# Patient Record
Sex: Female | Born: 1945
Health system: Southern US, Community
[De-identification: ages and names within clinical notes are randomized; demographics above are authoritative.]

## PROBLEM LIST (undated history)

## (undated) DIAGNOSIS — I10 Essential (primary) hypertension: Secondary | ICD-10-CM

## (undated) DIAGNOSIS — E079 Disorder of thyroid, unspecified: Secondary | ICD-10-CM

## (undated) DIAGNOSIS — E119 Type 2 diabetes mellitus without complications: Secondary | ICD-10-CM

## (undated) DIAGNOSIS — H409 Unspecified glaucoma: Secondary | ICD-10-CM

## (undated) DIAGNOSIS — I4891 Unspecified atrial fibrillation: Secondary | ICD-10-CM

## (undated) HISTORY — PX: LASIK: SHX215

## (undated) HISTORY — PX: CHOLECYSTECTOMY: SHX55

## (undated) HISTORY — PX: HERNIA REPAIR: SHX51

## (undated) HISTORY — PX: APPENDECTOMY: SHX54

---

## 2016-09-13 ENCOUNTER — Emergency Department (HOSPITAL_COMMUNITY): Payer: Medicare Other

## 2016-09-13 ENCOUNTER — Encounter (HOSPITAL_COMMUNITY): Payer: Self-pay | Admitting: *Deleted

## 2016-09-13 ENCOUNTER — Inpatient Hospital Stay (HOSPITAL_COMMUNITY): Payer: Medicare Other

## 2016-09-13 ENCOUNTER — Inpatient Hospital Stay (HOSPITAL_COMMUNITY)
Admission: EM | Admit: 2016-09-13 | Discharge: 2016-09-18 | DRG: 291 | Disposition: A | Discharge status: Home Health | Payer: Medicare Other | Attending: Family Medicine | Admitting: Family Medicine

## 2016-09-13 ENCOUNTER — Other Ambulatory Visit: Payer: Self-pay

## 2016-09-13 DIAGNOSIS — Z79899 Other long term (current) drug therapy: Secondary | ICD-10-CM | POA: Diagnosis not present

## 2016-09-13 DIAGNOSIS — L03115 Cellulitis of right lower limb: Secondary | ICD-10-CM | POA: Diagnosis present

## 2016-09-13 DIAGNOSIS — I5031 Acute diastolic (congestive) heart failure: Secondary | ICD-10-CM | POA: Diagnosis present

## 2016-09-13 DIAGNOSIS — I872 Venous insufficiency (chronic) (peripheral): Secondary | ICD-10-CM | POA: Diagnosis not present

## 2016-09-13 DIAGNOSIS — I340 Nonrheumatic mitral (valve) insufficiency: Secondary | ICD-10-CM | POA: Diagnosis present

## 2016-09-13 DIAGNOSIS — L0390 Cellulitis, unspecified: Secondary | ICD-10-CM | POA: Diagnosis not present

## 2016-09-13 DIAGNOSIS — J181 Lobar pneumonia, unspecified organism: Secondary | ICD-10-CM | POA: Diagnosis not present

## 2016-09-13 DIAGNOSIS — R0602 Shortness of breath: Secondary | ICD-10-CM

## 2016-09-13 DIAGNOSIS — I5033 Acute on chronic diastolic (congestive) heart failure: Secondary | ICD-10-CM | POA: Diagnosis not present

## 2016-09-13 DIAGNOSIS — I482 Chronic atrial fibrillation: Secondary | ICD-10-CM | POA: Diagnosis present

## 2016-09-13 DIAGNOSIS — R05 Cough: Secondary | ICD-10-CM | POA: Diagnosis not present

## 2016-09-13 DIAGNOSIS — J9600 Acute respiratory failure, unspecified whether with hypoxia or hypercapnia: Secondary | ICD-10-CM | POA: Diagnosis present

## 2016-09-13 DIAGNOSIS — Z9119 Patient's noncompliance with other medical treatment and regimen: Secondary | ICD-10-CM

## 2016-09-13 DIAGNOSIS — R402252 Coma scale, best verbal response, oriented, at arrival to emergency department: Secondary | ICD-10-CM | POA: Diagnosis present

## 2016-09-13 DIAGNOSIS — J189 Pneumonia, unspecified organism: Secondary | ICD-10-CM

## 2016-09-13 DIAGNOSIS — I110 Hypertensive heart disease with heart failure: Principal | ICD-10-CM | POA: Diagnosis present

## 2016-09-13 DIAGNOSIS — Z6841 Body Mass Index (BMI) 40.0 and over, adult: Secondary | ICD-10-CM

## 2016-09-13 DIAGNOSIS — Z880 Allergy status to penicillin: Secondary | ICD-10-CM

## 2016-09-13 DIAGNOSIS — E032 Hypothyroidism due to medicaments and other exogenous substances: Secondary | ICD-10-CM | POA: Diagnosis present

## 2016-09-13 DIAGNOSIS — N179 Acute kidney failure, unspecified: Secondary | ICD-10-CM | POA: Diagnosis present

## 2016-09-13 DIAGNOSIS — J440 Chronic obstructive pulmonary disease with acute lower respiratory infection: Secondary | ICD-10-CM | POA: Diagnosis present

## 2016-09-13 DIAGNOSIS — R0600 Dyspnea, unspecified: Secondary | ICD-10-CM

## 2016-09-13 DIAGNOSIS — F419 Anxiety disorder, unspecified: Secondary | ICD-10-CM | POA: Diagnosis present

## 2016-09-13 DIAGNOSIS — R059 Cough, unspecified: Secondary | ICD-10-CM

## 2016-09-13 DIAGNOSIS — R402142 Coma scale, eyes open, spontaneous, at arrival to emergency department: Secondary | ICD-10-CM | POA: Diagnosis present

## 2016-09-13 DIAGNOSIS — F329 Major depressive disorder, single episode, unspecified: Secondary | ICD-10-CM | POA: Diagnosis present

## 2016-09-13 DIAGNOSIS — R402362 Coma scale, best motor response, obeys commands, at arrival to emergency department: Secondary | ICD-10-CM | POA: Diagnosis present

## 2016-09-13 DIAGNOSIS — I509 Heart failure, unspecified: Secondary | ICD-10-CM

## 2016-09-13 DIAGNOSIS — E119 Type 2 diabetes mellitus without complications: Secondary | ICD-10-CM | POA: Diagnosis present

## 2016-09-13 HISTORY — DX: Type 2 diabetes mellitus without complications: E11.9

## 2016-09-13 HISTORY — DX: Disorder of thyroid, unspecified: E07.9

## 2016-09-13 HISTORY — DX: Essential (primary) hypertension: I10

## 2016-09-13 HISTORY — DX: Unspecified atrial fibrillation: I48.91

## 2016-09-13 HISTORY — DX: Unspecified glaucoma: H40.9

## 2016-09-13 LAB — COMPREHENSIVE METABOLIC PANEL
ALBUMIN: 3.4 g/dL — AB (ref 3.5–5.0)
ALK PHOS: 62 U/L (ref 38–126)
ALT: 29 U/L (ref 14–54)
AST: 23 U/L (ref 15–41)
Anion gap: 8 (ref 5–15)
BILIRUBIN TOTAL: 2.4 mg/dL — AB (ref 0.3–1.2)
BUN: 30 mg/dL — AB (ref 6–20)
CO2: 26 mmol/L (ref 22–32)
Calcium: 8.8 mg/dL — ABNORMAL LOW (ref 8.9–10.3)
Chloride: 108 mmol/L (ref 101–111)
Creatinine, Ser: 0.94 mg/dL (ref 0.44–1.00)
GFR calc Af Amer: >60 mL/min (ref >60)
GFR calc non Af Amer: >60 mL/min (ref >60)
GLUCOSE: 117 mg/dL — AB (ref 65–99)
POTASSIUM: 3.3 mmol/L — AB (ref 3.5–5.1)
Sodium: 142 mmol/L (ref 135–145)
TOTAL PROTEIN: 7.9 g/dL (ref 6.5–8.1)

## 2016-09-13 LAB — CBC WITH DIFFERENTIAL/PLATELET
BASOS PCT: 0 %
Basophils Absolute: 0 10*3/uL (ref 0.0–0.1)
Eosinophils Absolute: 0 10*3/uL (ref 0.0–0.7)
Eosinophils Relative: 0 %
HEMATOCRIT: 31.7 % — AB (ref 36.0–46.0)
HEMOGLOBIN: 9.6 g/dL — AB (ref 12.0–15.0)
Lymphocytes Relative: 5 %
Lymphs Abs: 0.4 10*3/uL — ABNORMAL LOW (ref 0.7–4.0)
MCH: 26.5 pg (ref 26.0–34.0)
MCHC: 30.3 g/dL (ref 30.0–36.0)
MCV: 87.6 fL (ref 78.0–100.0)
Monocytes Absolute: 0.5 10*3/uL (ref 0.1–1.0)
Monocytes Relative: 6 %
NEUTROS ABS: 8.4 10*3/uL — AB (ref 1.7–7.7)
NEUTROS PCT: 89 %
Platelets: 231 10*3/uL (ref 150–400)
RBC: 3.62 MIL/uL — ABNORMAL LOW (ref 3.87–5.11)
RDW: 16.9 % — AB (ref 11.5–15.5)
WBC: 9.4 10*3/uL (ref 4.0–10.5)

## 2016-09-13 LAB — APTT: aPTT: 32 seconds (ref 24–36)

## 2016-09-13 LAB — PROTIME-INR
INR: 2.11
Prothrombin Time: 24 seconds — ABNORMAL HIGH (ref 11.4–15.2)

## 2016-09-13 LAB — GLUCOSE, CAPILLARY: GLUCOSE-CAPILLARY: 135 mg/dL — AB (ref 65–99)

## 2016-09-13 LAB — TROPONIN I
Troponin I: 0.03 ng/mL (ref <0.03)
Troponin I: <0.03 ng/mL (ref <0.03)

## 2016-09-13 LAB — MAGNESIUM: MAGNESIUM: 1.7 mg/dL (ref 1.7–2.4)

## 2016-09-13 LAB — BRAIN NATRIURETIC PEPTIDE: B NATRIURETIC PEPTIDE 5: 360.4 pg/mL — AB (ref 0.0–100.0)

## 2016-09-13 LAB — TSH: TSH: 3.953 u[IU]/mL (ref 0.350–4.500)

## 2016-09-13 LAB — SEDIMENTATION RATE: SED RATE: 75 mm/h — AB (ref 0–22)

## 2016-09-13 MED ORDER — LEVOFLOXACIN IN D5W 750 MG/150ML IV SOLN
750.0000 mg | INTRAVENOUS | Status: DC
  Administered 2016-09-13 – 2016-09-15 (×3): 750 mg via INTRAVENOUS
  Filled 2016-09-13 (×3): qty 150

## 2016-09-13 MED ORDER — TRAMADOL HCL 50 MG PO TABS
50.0000 mg | ORAL_TABLET | Freq: Four times a day (QID) | ORAL | Status: DC | PRN
  Administered 2016-09-14 – 2016-09-17 (×7): 50 mg via ORAL
  Filled 2016-09-13 (×8): qty 1

## 2016-09-13 MED ORDER — IOPAMIDOL (ISOVUE-370) INJECTION 76%
INTRAVENOUS | Status: AC
  Administered 2016-09-13: 100 mL
  Filled 2016-09-13: qty 100

## 2016-09-13 MED ORDER — MORPHINE SULFATE (PF) 4 MG/ML IV SOLN
2.0000 mg | Freq: Once | INTRAVENOUS | Status: AC
  Administered 2016-09-13: 2 mg via INTRAVENOUS
  Filled 2016-09-13: qty 1

## 2016-09-13 MED ORDER — TRAZODONE HCL 50 MG PO TABS
50.0000 mg | ORAL_TABLET | Freq: Every day | ORAL | Status: DC
  Administered 2016-09-13 – 2016-09-17 (×5): 100 mg via ORAL
  Filled 2016-09-13 (×5): qty 2

## 2016-09-13 MED ORDER — MORPHINE SULFATE (PF) 4 MG/ML IV SOLN
2.0000 mg | INTRAVENOUS | Status: DC | PRN
  Administered 2016-09-13 – 2016-09-17 (×8): 2 mg via INTRAVENOUS
  Filled 2016-09-13 (×8): qty 1

## 2016-09-13 MED ORDER — IPRATROPIUM BROMIDE 0.02 % IN SOLN
0.5000 mg | Freq: Once | RESPIRATORY_TRACT | Status: AC
  Administered 2016-09-13: 0.5 mg via RESPIRATORY_TRACT
  Filled 2016-09-13: qty 2.5

## 2016-09-13 MED ORDER — INSULIN ASPART 100 UNIT/ML ~~LOC~~ SOLN: 0.0000 [IU] | Freq: Every day | SUBCUTANEOUS | Status: DC

## 2016-09-13 MED ORDER — METOPROLOL TARTRATE 50 MG PO TABS
50.0000 mg | ORAL_TABLET | Freq: Two times a day (BID) | ORAL | Status: DC
  Administered 2016-09-13 – 2016-09-18 (×10): 50 mg via ORAL
  Filled 2016-09-13 (×10): qty 1

## 2016-09-13 MED ORDER — ALPRAZOLAM 0.5 MG PO TABS
0.5000 mg | ORAL_TABLET | Freq: Two times a day (BID) | ORAL | Status: DC | PRN
  Administered 2016-09-14 – 2016-09-17 (×6): 0.5 mg via ORAL
  Filled 2016-09-13 (×6): qty 1

## 2016-09-13 MED ORDER — SODIUM CHLORIDE 0.9% FLUSH: 3.0000 mL | INTRAVENOUS | Status: DC | PRN

## 2016-09-13 MED ORDER — POTASSIUM CHLORIDE CRYS ER 20 MEQ PO TBCR
40.0000 meq | EXTENDED_RELEASE_TABLET | Freq: Three times a day (TID) | ORAL | Status: AC
  Administered 2016-09-13 – 2016-09-14 (×3): 40 meq via ORAL
  Filled 2016-09-13 (×3): qty 2

## 2016-09-13 MED ORDER — ACETAMINOPHEN 325 MG PO TABS
650.0000 mg | ORAL_TABLET | Freq: Four times a day (QID) | ORAL | Status: DC | PRN
  Administered 2016-09-16 (×2): 650 mg via ORAL
  Filled 2016-09-13 (×2): qty 2

## 2016-09-13 MED ORDER — SODIUM CHLORIDE 0.9 % IV SOLN: 250.0000 mL | INTRAVENOUS | Status: DC | PRN

## 2016-09-13 MED ORDER — LORATADINE 10 MG PO TABS
10.0000 mg | ORAL_TABLET | Freq: Every evening | ORAL | Status: DC
  Administered 2016-09-14 – 2016-09-18 (×5): 10 mg via ORAL
  Filled 2016-09-13 (×5): qty 1

## 2016-09-13 MED ORDER — ENOXAPARIN SODIUM 80 MG/0.8ML ~~LOC~~ SOLN: 75.0000 mg | SUBCUTANEOUS | Status: DC

## 2016-09-13 MED ORDER — MONTELUKAST SODIUM 10 MG PO TABS
10.0000 mg | ORAL_TABLET | Freq: Every day | ORAL | Status: DC
  Administered 2016-09-13 – 2016-09-17 (×5): 10 mg via ORAL
  Filled 2016-09-13 (×5): qty 1

## 2016-09-13 MED ORDER — METFORMIN HCL 500 MG PO TABS
500.0000 mg | ORAL_TABLET | Freq: Every day | ORAL | Status: DC
  Administered 2016-09-14 – 2016-09-18 (×5): 500 mg via ORAL
  Filled 2016-09-13 (×5): qty 1

## 2016-09-13 MED ORDER — SODIUM CHLORIDE 0.45 % IV SOLN
INTRAVENOUS | Status: DC
  Administered 2016-09-13: 21:00:00 via INTRAVENOUS

## 2016-09-13 MED ORDER — DULOXETINE HCL 30 MG PO CPEP
90.0000 mg | ORAL_CAPSULE | Freq: Every day | ORAL | Status: DC
  Administered 2016-09-14 – 2016-09-18 (×5): 90 mg via ORAL
  Filled 2016-09-13 (×6): qty 1

## 2016-09-13 MED ORDER — ENOXAPARIN SODIUM 40 MG/0.4ML ~~LOC~~ SOLN: 40.0000 mg | SUBCUTANEOUS | Status: DC

## 2016-09-13 MED ORDER — BISACODYL 10 MG RE SUPP: 10.0000 mg | Freq: Every day | RECTAL | Status: DC | PRN

## 2016-09-13 MED ORDER — FUROSEMIDE 10 MG/ML IJ SOLN
40.0000 mg | Freq: Two times a day (BID) | INTRAMUSCULAR | Status: DC
  Administered 2016-09-13 – 2016-09-14 (×3): 40 mg via INTRAVENOUS
  Filled 2016-09-13 (×3): qty 4

## 2016-09-13 MED ORDER — AMIODARONE HCL 200 MG PO TABS
200.0000 mg | ORAL_TABLET | Freq: Every day | ORAL | Status: DC
  Administered 2016-09-14 – 2016-09-18 (×5): 200 mg via ORAL
  Filled 2016-09-13 (×5): qty 1

## 2016-09-13 MED ORDER — PANTOPRAZOLE SODIUM 40 MG PO TBEC
40.0000 mg | DELAYED_RELEASE_TABLET | Freq: Every day | ORAL | Status: DC
  Administered 2016-09-13 – 2016-09-18 (×6): 40 mg via ORAL
  Filled 2016-09-13 (×6): qty 1

## 2016-09-13 MED ORDER — ONDANSETRON HCL 4 MG PO TABS: 4.0000 mg | ORAL_TABLET | Freq: Four times a day (QID) | ORAL | Status: DC | PRN

## 2016-09-13 MED ORDER — POTASSIUM CHLORIDE CRYS ER 20 MEQ PO TBCR: 20.0000 meq | EXTENDED_RELEASE_TABLET | Freq: Every day | ORAL | Status: DC

## 2016-09-13 MED ORDER — SODIUM CHLORIDE 0.9 % IV SOLN
INTRAVENOUS | Status: DC
  Administered 2016-09-13: 15:00:00 via INTRAVENOUS

## 2016-09-13 MED ORDER — ATOMOXETINE HCL 40 MG PO CAPS
80.0000 mg | ORAL_CAPSULE | Freq: Every day | ORAL | Status: DC
  Administered 2016-09-14 – 2016-09-18 (×5): 80 mg via ORAL
  Filled 2016-09-13 (×5): qty 2

## 2016-09-13 MED ORDER — LEVOTHYROXINE SODIUM 25 MCG PO TABS
25.0000 ug | ORAL_TABLET | Freq: Every day | ORAL | Status: DC
  Administered 2016-09-14 – 2016-09-18 (×5): 25 ug via ORAL
  Filled 2016-09-13 (×5): qty 1

## 2016-09-13 MED ORDER — ALBUTEROL SULFATE (2.5 MG/3ML) 0.083% IN NEBU
2.5000 mg | INHALATION_SOLUTION | Freq: Four times a day (QID) | RESPIRATORY_TRACT | Status: DC | PRN
  Administered 2016-09-14 – 2016-09-15 (×3): 2.5 mg via RESPIRATORY_TRACT
  Filled 2016-09-13 (×3): qty 3

## 2016-09-13 MED ORDER — ALBUTEROL SULFATE (2.5 MG/3ML) 0.083% IN NEBU
5.0000 mg | INHALATION_SOLUTION | Freq: Once | RESPIRATORY_TRACT | Status: AC
  Administered 2016-09-13: 5 mg via RESPIRATORY_TRACT
  Filled 2016-09-13: qty 6

## 2016-09-13 MED ORDER — SODIUM CHLORIDE 0.9% FLUSH
3.0000 mL | Freq: Two times a day (BID) | INTRAVENOUS | Status: DC
  Administered 2016-09-13 – 2016-09-18 (×9): 3 mL via INTRAVENOUS

## 2016-09-13 MED ORDER — ONDANSETRON HCL 4 MG/2ML IJ SOLN: 4.0000 mg | Freq: Four times a day (QID) | INTRAMUSCULAR | Status: DC | PRN

## 2016-09-13 MED ORDER — METHYLPREDNISOLONE SODIUM SUCC 125 MG IJ SOLR
125.0000 mg | Freq: Once | INTRAMUSCULAR | Status: AC
  Administered 2016-09-13: 125 mg via INTRAVENOUS
  Filled 2016-09-13: qty 2

## 2016-09-13 MED ORDER — ACETAMINOPHEN 650 MG RE SUPP: 650.0000 mg | Freq: Four times a day (QID) | RECTAL | Status: DC | PRN

## 2016-09-13 MED ORDER — INSULIN ASPART 100 UNIT/ML ~~LOC~~ SOLN
0.0000 [IU] | Freq: Three times a day (TID) | SUBCUTANEOUS | Status: DC
  Administered 2016-09-14: 3 [IU] via SUBCUTANEOUS
  Administered 2016-09-14 – 2016-09-18 (×3): 2 [IU] via SUBCUTANEOUS

## 2016-09-13 NOTE — ED Notes (Signed)
Bed: WA06 Expected date:  Expected time:  Means of arrival:  Comments: 71 yo f edema lower leg, cough

## 2016-09-13 NOTE — H&P (Signed)
History and Physical    Hannah [name] JYN:[id number] DOB: [birth year] DOA: 09/13/2016  Referring MD/NP/PA: EDP PCP: No PCP Per Patient  Outpatient Specialists:  Patient coming from: home  Chief Complaint: cough and shortness of breath  HPI: Hannah [name] is a 71 y.o. female with medical history significant for and not limited to atrial fibrillation, diabetes mellitus and CHF who presents to the ED with 2 day history of cough productive of thick yellowish sputum and shortness of breath without chest pain, palpitation, dizziness, fever or chills.  She recently moved to [place] from [place] and had driven from [place] over last few days. She had also noted increasing swelling of her lower extremities from her baseline. She has some redness of both extremities but that of the right lower extremity has been worsening for a few weeks now.  Lastly she suffered accidental fall whilst moving furniture at home in [place] recently.   ED Course: at the ED patient's x-ray of the chest was negative for any airspace disease. Check follow-up CT angiogram which was negative for PE but showed bilateral patchy airspace disease c/w with possible pneumonia. IV normal saline initiated and hospitalist consulted for admission  Review of Systems: As per HPI otherwise 10 point review of systems negative.    Past Medical History:  Diagnosis Date  . Atrial fibrillation ([place])   . Diabetes mellitus without complication ([place])   . Glaucoma    BILATERAL  . Hypertension   . Thyroid disease     Past Surgical History:  Procedure Laterality Date  . HERNIA REPAIR     UMBILICAL  . LASIK     BILATERAL     reports that she has never smoked. She has never used smokeless tobacco. She reports that she does not drink alcohol. Her drug history is not on file.  Allergies  Allergen Reactions  . Penicillins     Possibility; father and brother allergic     History reviewed. No pertinent  family history.   Prior to Admission medications   Medication Sig Start Date End Date Taking? Authorizing Provider  ALPRAZolam Duanne Moron) 0.5 MG tablet Take 0.5 mg by mouth 2 (two) times daily as needed for anxiety.   Yes Historical Provider, MD  amiodarone (PACERONE) 200 MG tablet Take 200 mg by mouth daily.   Yes Historical Provider, MD  atomoxetine (STRATTERA) 80 MG capsule Take 80 mg by mouth daily.   Yes Historical Provider, MD  busPIRone (BUSPAR) 15 MG tablet Take 7.5 mg by mouth 2 (two) times daily.   Yes Historical Provider, MD  DULoxetine (CYMBALTA) 30 MG capsule Take 90 mg by mouth daily.   Yes Historical Provider, MD  esomeprazole (NEXIUM) 40 MG capsule Take 40 mg by mouth daily.   Yes Historical Provider, MD  furosemide (LASIX) 80 MG tablet Take 80 mg by mouth daily.   Yes Historical Provider, MD  levocetirizine (XYZAL) 5 MG tablet Take 5 mg by mouth every evening.   Yes Historical Provider, MD  levothyroxine (SYNTHROID, LEVOTHROID) 25 MCG tablet Take 25 mcg by mouth daily before breakfast.   Yes Historical Provider, MD  metFORMIN (GLUCOPHAGE) 500 MG tablet Take 500 mg by mouth daily with breakfast.   Yes Historical Provider, MD  metoprolol (LOPRESSOR) 50 MG tablet Take 50 mg by mouth 2 (two) times daily.   Yes Historical Provider, MD  montelukast (SINGULAIR) 10 MG tablet Take 10 mg by mouth every evening.   Yes Historical Provider, MD  Potassium  Chloride ER 20 MEQ TBCR Take 20 mEq by mouth daily.   Yes Historical Provider, MD  traZODone (DESYREL) 50 MG tablet Take 50-100 mg by mouth at bedtime.   Yes Historical Provider, MD  warfarin (COUMADIN) 5 MG tablet Take 5 mg by mouth daily.   Yes Historical Provider, MD  zolpidem (AMBIEN) 10 MG tablet Take 10 mg by mouth at bedtime.   Yes Historical Provider, MD    Physical Exam: Vitals:   09/13/16 1313 09/13/16 1627 09/13/16 1645 09/13/16 1744  BP:  164/97 179/66 142/85  Pulse:  97  99  Resp:  22  22  Temp:      TempSrc:      SpO2:   100%  100%  Weight: (!) 147.4 kg (325 lb)     Height: 5\' 6"  (1.676 m)         Constitutional: NAD, calm, comfortable Vitals:   09/13/16 1313 09/13/16 1627 09/13/16 1645 09/13/16 1744  BP:  164/97 179/66 142/85  Pulse:  97  99  Resp:  22  22  Temp:      TempSrc:      SpO2:  100%  100%  Weight: (!) 147.4 kg (325 lb)     Height: 5\' 6"  (1.676 m)      Eyes: PERRL, lids and conjunctivae normal ENMT: Mucous membranes are moist. Posterior pharynx clear of any exudate or lesions.Normal dentition.  Neck: normal, supple, no masses, no thyromegaly Respiratory: diminished breath sounds with bilateral rhonchorous breath sounds.  Normal respiratory effort. No accessory muscle use.  Cardiovascular: irregularly iregula rhythm, no murmurs / rubs / gallops. No extremity edema. 2+ pedal pulses. No carotid bruits.  Abdomen: no tenderness, no masses palpated. No hepatosplenomegaly. Bowel sounds positive.  Musculoskeletal: no clubbing / cyanosis. No joint deformity upper and lower extremities. Good ROM, no contractures. Normal muscle tone. Bilateral pitting edema (2+), superimposed redness of both lower legs, right >> left; right swelling and redness warm to touch, slightly tender to palpation without any fluctuancy Skin: no rashes,ulcers.No induration. Right upper arm bruise. Neurologic: CN 2-12 grossly intact. Sensation intact, DTR normal. Strength 5/5 in all 4.  Psychiatric: Normal judgment and insight. Alert and oriented x 3. Normal mood.   Labs on Admission: I have personally reviewed following labs and imaging studies  CBC:  Recent Labs Lab 09/13/16 1337  WBC 9.4  NEUTROABS 8.4*  HGB 9.6*  HCT 31.7*  MCV 87.6  PLT 016   Basic Metabolic Panel:  Recent Labs Lab 09/13/16 1337  NA 142  K 3.3*  CL 108  CO2 26  GLUCOSE 117*  BUN 30*  CREATININE 0.94  CALCIUM 8.8*   GFR: Estimated Creatinine Clearance: 84.3 mL/min (by C-G formula based on SCr of 0.94 mg/dL). Liver Function  Tests:  Recent Labs Lab 09/13/16 1337  AST 23  ALT 29  ALKPHOS 62  BILITOT 2.4*  PROT 7.9  ALBUMIN 3.4*   No results for input(s): LIPASE, AMYLASE in the last 168 hours. No results for input(s): AMMONIA in the last 168 hours. Coagulation Profile: No results for input(s): INR, PROTIME in the last 168 hours. Cardiac Enzymes:  Recent Labs Lab 09/13/16 1337  TROPONINI 0.03*   BNP (last 3 results) No results for input(s): PROBNP in the last 8760 hours. HbA1C: No results for input(s): HGBA1C in the last 72 hours. CBG: No results for input(s): GLUCAP in the last 168 hours. Lipid Profile: No results for input(s): CHOL, HDL, LDLCALC, TRIG, CHOLHDL, LDLDIRECT in the  last 72 hours. Thyroid Function Tests: No results for input(s): TSH, T4TOTAL, FREET4, T3FREE, THYROIDAB in the last 72 hours. Anemia Panel: No results for input(s): VITAMINB12, FOLATE, FERRITIN, TIBC, IRON, RETICCTPCT in the last 72 hours. Urine analysis: No results found for: COLORURINE, APPEARANCEUR, LABSPEC, PHURINE, GLUCOSEU, HGBUR, BILIRUBINUR, KETONESUR, PROTEINUR, UROBILINOGEN, NITRITE, LEUKOCYTESUR Sepsis Labs: @LABRCNTIP (procalcitonin:4,lacticidven:4) )No results found for this or any previous visit (from the past 240 hour(s)).   Radiological Exams on Admission: Ct Angio Chest Pe W Or Wo Contrast  Result Date: 09/13/2016 CLINICAL DATA:  Back pain since falling yesterday. Edema in lower legs. Shortness of breath. EXAM: CT ANGIOGRAPHY CHEST WITH CONTRAST TECHNIQUE: Multidetector CT imaging of the chest was performed using the standard protocol during bolus administration of intravenous contrast. Multiplanar CT image reconstructions and MIPs were obtained to evaluate the vascular anatomy. CONTRAST:  100 mL of Isovue 370 COMPARISON:  Chest x-ray from earlier today FINDINGS: Cardiovascular: Cardiomegaly is identified. There are coronary artery calcifications, particularly in the left coronary arteries. The thoracic  aorta is normal in caliber with no dissection. No pulmonary emboli identified. Mediastinum/Nodes: Shotty nodes are seen in the mediastinum. A representative right paratracheal node on axial image 21 measures 13 x 12 mm in short axis. These nodes may simply be reactive given the findings in the lungs. Mildly enlarged node in the left hilum. No effusions. The esophagus and limited views of the thyroid are normal. Lungs/Pleura: Central airways are normal. No pneumothorax. Mild air trapping in the lungs. Minimal interlobular septal thickening in the apices may represent minimal edema. There is a small dense nodule in the late apex measuring 5.1 mm on coronal image 71. Scattered pulmonary opacities in the lung bases, left greater than right. While there is a component of dependent atelectasis, the findings are consistent with bibasilar infiltrates. There are nodular components which are ground-glass in attenuation. Two such nodules are identified on axial image 36. The larger in the right upper lobe measures 14 mm well the smaller in the superior segment of the right lower lobe measures 8 mm. A somewhat nodular region of opacity is seen posteriorly in the superior segment of the left lower lobe on axial image 51. No masses. Upper Abdomen: No acute abnormality. Musculoskeletal: No chest wall abnormality. No acute or significant osseous findings. Review of the MIP images confirms the above findings. IMPRESSION: 1. No pulmonary emboli. 2. Patchy infiltrates are seen in the lungs bilaterally, most prominent in the left lung base. A few ground-glass nodules, as described above, are probably part of the infiltrative process. I suspect an infectious or inflammatory process. Recommend short-term follow-up CT scan after treatment to ensure resolution. 3. Mild edema.  Mild air trapping. 4. Mild mediastinal and left hilar adenopathy, probably reactive. Recommend attention on follow-up. Electronically Signed   By: Dorise Bullion  III M.D   On: 09/13/2016 18:38   Dg Chest Port 1 View  Result Date: 09/13/2016 CLINICAL DATA:  Shortness of breath, cough. EXAM: PORTABLE CHEST 1 VIEW COMPARISON:  None. FINDINGS: Mild cardiomegaly is noted. No pneumothorax or pleural effusion is noted. Both lungs are clear. The visualized skeletal structures are unremarkable. IMPRESSION: No acute cardiopulmonary abnormality seen. Electronically Signed   By: Marijo Conception, M.D.   On: 09/13/2016 13:47    EKG: -ordered  Assessment/Plan Principal Problem:   Community acquired pneumonia Active Problems:   CHF (congestive heart failure) (HCC)   Cellulitis of right lower extremity  #1: Community-acquired pneumonia: IV antibiotic Nebulized bronchodilators when necessary  Blood culture Oxygen supplementation Supportive care  #2 CHF exacerbation; Mild Gentle diuresis -bun mildly elevated ready Cardiac enzymes ordered  #3 right lower extremity cellulitis: IV antibiotic- Levaquin to cover both #1 and 3 Ultrasound of the extremity to rule out DVT due to asymmetry of both extremities X-ray ordered to rule out DVT due to chronicity by history Sedimentation rate ordered  #4 history of A. Fib: Continue amiodarone and anticoagulation  #5 diabetes mellitus: Continue metformin- hold if creatinine less than or equal to 1.4 Sliding-scale insulin with monitoring of CBGs  #6 hypokalemia: Potassium repletion Monitor with diuresis May have to increase baseline potassium placement   DVT prophylaxis: (Lovenox) Code Status:  (Full) Family Communication:  Disposition Plan:  (Home) Consults called:  Admission status: (inpatient Margarita Sermons MD [facility] Pager [phone]  If 7PM-7AM, please contact night-coverage [email or web address] Password [email or web address]  09/13/2016, 7:17 PM

## 2016-09-13 NOTE — ED Notes (Addendum)
Pt had IV removed in CT scan. She is in need of an additional IV for CT angiogram. Maylon Cos, RN attempting u/s IV placement.

## 2016-09-13 NOTE — ED Notes (Addendum)
Hospitalist, Dr. Orma Render at bedside.

## 2016-09-13 NOTE — ED Notes (Signed)
Patient transported to CT 

## 2016-09-13 NOTE — ED Triage Notes (Signed)
Per EMS, pt complains of back pain since falling yesterday. Pt slipped pushing her chair/walker. Pt has edema to lower legs, which she states has been present for 2 years. Pt also complains of cough for the past week.

## 2016-09-13 NOTE — ED Notes (Signed)
Stuck pt for labs was unsuccessful

## 2016-09-13 NOTE — ED Provider Notes (Signed)
[facility] DEPT Provider Note   CSN: [id number] Arrival date & time: 09/13/16  1241     History   Chief Complaint Chief Complaint  Patient presents with  . Back Pain  . Cough    HPI Hannah [name] is a 71 y.o. female.  71 year old female presents with several days of lower extremity edema, dyspnea, nonproductive cough. History of CHF as well as atrial fibrillation. Denies any fever or chills. No syncope or presyncope. No vomiting. No medications taken for this. Denies any anginal chest pain. No diaphoresis. She is also had orthopnea. Symptoms have been progressively worse      Past Medical History:  Diagnosis Date  . Atrial fibrillation ([place])   . Diabetes mellitus without complication ([place])   . Hypertension   . Thyroid disease     There are no active problems to display for this patient.   No past surgical history on file.  OB History    No data available       Home Medications    Prior to Admission medications   Not on File    Family History No family history on file.  Social History Social History  Substance Use Topics  . Smoking status: Never Smoker  . Smokeless tobacco: Never Used  . Alcohol use No     Allergies   Penicillins   Review of Systems Review of Systems  All other systems reviewed and are negative.    Physical Exam Updated Vital Signs BP 167/84   Pulse 92   Temp 97 F (36.1 C) (Oral)   Resp 22   SpO2 100%   Physical Exam  Constitutional: She is oriented to person, place, and time. She appears well-developed and well-nourished.  Non-toxic appearance. No distress.  HENT:  Head: Normocephalic and atraumatic.  Eyes: Conjunctivae, EOM and lids are normal. Pupils are equal, round, and reactive to light.  Neck: Normal range of motion. Neck supple. No tracheal deviation present. No thyroid mass present.  Cardiovascular: Normal heart sounds.  An irregularly irregular rhythm present. Tachycardia present.  Exam reveals  no gallop.   No murmur heard. Pulmonary/Chest: Effort normal. No stridor. No respiratory distress. She has decreased breath sounds in the right lower field and the left lower field. She has no wheezes. She has rhonchi in the right lower field and the left lower field. She has no rales.  Abdominal: Soft. Normal appearance and bowel sounds are normal. She exhibits no distension. There is no tenderness. There is no rebound and no CVA tenderness.  Musculoskeletal: Normal range of motion. She exhibits no edema or tenderness.  3+ bilateral lower extremity pitting edema with erythema but without frank cellulitic features.  Neurological: She is alert and oriented to person, place, and time. She has normal strength. No cranial nerve deficit or sensory deficit. GCS eye subscore is 4. GCS verbal subscore is 5. GCS motor subscore is 6.  Skin: Skin is warm and dry. No abrasion and no rash noted.  Psychiatric: She has a normal mood and affect. Her speech is normal and behavior is normal.  Nursing note and vitals reviewed.    ED Treatments / Results  Labs (all labs ordered are listed, but only abnormal results are displayed) Labs Reviewed  BRAIN NATRIURETIC PEPTIDE  CBC WITH DIFFERENTIAL/PLATELET  COMPREHENSIVE METABOLIC PANEL  TROPONIN I    EKG  EKG Interpretation None       Radiology No results found.  Procedures Procedures (including critical care time)  Medications Ordered in  ED Medications  0.9 %  sodium chloride infusion (not administered)     Initial Impression / Assessment and Plan / ED Course  I have reviewed the triage vital signs and the nursing notes.  Pertinent labs & imaging results that were available during my care of the patient were reviewed by me and considered in my medical decision making (see chart for details).     Patient treated with albuterol and salmeterol for her wheezing. Does have elevated troponin. Chest CT pending. We'll consult hospitalist for  admission  Final Clinical Impressions(s) / ED Diagnoses   Final diagnoses:  SOB (shortness of breath)    New Prescriptions New Prescriptions   No medications on file     Lacretia Leigh, MD 09/13/16 1601

## 2016-09-13 NOTE — ED Notes (Signed)
Xray at the bedside.

## 2016-09-14 ENCOUNTER — Inpatient Hospital Stay (HOSPITAL_COMMUNITY): Payer: Medicare Other

## 2016-09-14 DIAGNOSIS — L0390 Cellulitis, unspecified: Secondary | ICD-10-CM

## 2016-09-14 LAB — COMPREHENSIVE METABOLIC PANEL
ALBUMIN: 3.1 g/dL — AB (ref 3.5–5.0)
ALT: 27 U/L (ref 14–54)
ANION GAP: 7 (ref 5–15)
AST: 24 U/L (ref 15–41)
Alkaline Phosphatase: 61 U/L (ref 38–126)
BUN: 25 mg/dL — AB (ref 6–20)
CHLORIDE: 107 mmol/L (ref 101–111)
CO2: 27 mmol/L (ref 22–32)
Calcium: 8.7 mg/dL — ABNORMAL LOW (ref 8.9–10.3)
Creatinine, Ser: 0.91 mg/dL (ref 0.44–1.00)
GFR calc Af Amer: >60 mL/min (ref >60)
GFR calc non Af Amer: >60 mL/min (ref >60)
GLUCOSE: 196 mg/dL — AB (ref 65–99)
POTASSIUM: 4.2 mmol/L (ref 3.5–5.1)
SODIUM: 141 mmol/L (ref 135–145)
Total Bilirubin: 2 mg/dL — ABNORMAL HIGH (ref 0.3–1.2)
Total Protein: 7.5 g/dL (ref 6.5–8.1)

## 2016-09-14 LAB — CBC
HEMATOCRIT: 29.6 % — AB (ref 36.0–46.0)
HEMOGLOBIN: 8.9 g/dL — AB (ref 12.0–15.0)
MCH: 26.2 pg (ref 26.0–34.0)
MCHC: 30.1 g/dL (ref 30.0–36.0)
MCV: 87.1 fL (ref 78.0–100.0)
Platelets: 217 10*3/uL (ref 150–400)
RBC: 3.4 MIL/uL — ABNORMAL LOW (ref 3.87–5.11)
RDW: 16.9 % — ABNORMAL HIGH (ref 11.5–15.5)
WBC: 8.8 10*3/uL (ref 4.0–10.5)

## 2016-09-14 LAB — TROPONIN I
TROPONIN I: 0.03 ng/mL — AB (ref <0.03)
Troponin I: <0.03 ng/mL (ref <0.03)

## 2016-09-14 LAB — HIV ANTIBODY (ROUTINE TESTING W REFLEX): HIV Screen 4th Generation wRfx: NONREACTIVE

## 2016-09-14 LAB — GLUCOSE, CAPILLARY
GLUCOSE-CAPILLARY: 143 mg/dL — AB (ref 65–99)
GLUCOSE-CAPILLARY: 99 mg/dL (ref 65–99)
Glucose-Capillary: 120 mg/dL — ABNORMAL HIGH (ref 65–99)
Glucose-Capillary: 156 mg/dL — ABNORMAL HIGH (ref 65–99)

## 2016-09-14 LAB — PROTIME-INR
INR: 2.05
Prothrombin Time: 23.5 seconds — ABNORMAL HIGH (ref 11.4–15.2)

## 2016-09-14 LAB — PROCALCITONIN: Procalcitonin: <0.1 ng/mL

## 2016-09-14 LAB — ECHOCARDIOGRAM COMPLETE
Height: 66 in
Weight: 5206.38 oz

## 2016-09-14 LAB — STREP PNEUMONIAE URINARY ANTIGEN: Strep Pneumo Urinary Antigen: NEGATIVE

## 2016-09-14 LAB — LACTIC ACID, PLASMA
LACTIC ACID, VENOUS: 1.8 mmol/L (ref 0.5–1.9)
LACTIC ACID, VENOUS: 1.9 mmol/L (ref 0.5–1.9)

## 2016-09-14 MED ORDER — ORAL CARE MOUTH RINSE
15.0000 mL | Freq: Two times a day (BID) | OROMUCOSAL | Status: DC
  Administered 2016-09-14 – 2016-09-15 (×3): 15 mL via OROMUCOSAL

## 2016-09-14 MED ORDER — WARFARIN SODIUM 5 MG PO TABS: 5.0000 mg | ORAL_TABLET | Freq: Every day | ORAL | Status: DC

## 2016-09-14 MED ORDER — GUAIFENESIN-DM 100-10 MG/5ML PO SYRP
5.0000 mL | ORAL_SOLUTION | ORAL | Status: DC | PRN
  Administered 2016-09-14 – 2016-09-17 (×7): 5 mL via ORAL
  Filled 2016-09-14 (×7): qty 10

## 2016-09-14 MED ORDER — WARFARIN SODIUM 4 MG PO TABS
4.0000 mg | ORAL_TABLET | Freq: Once | ORAL | Status: AC
  Administered 2016-09-14: 4 mg via ORAL
  Filled 2016-09-14: qty 1

## 2016-09-14 MED ORDER — WARFARIN SODIUM 5 MG PO TABS
5.0000 mg | ORAL_TABLET | Freq: Once | ORAL | Status: AC
  Administered 2016-09-14: 5 mg via ORAL
  Filled 2016-09-14: qty 1

## 2016-09-14 MED ORDER — WARFARIN - PHARMACIST DOSING INPATIENT: Freq: Every day | Status: DC

## 2016-09-14 NOTE — Progress Notes (Signed)
  Echocardiogram 2D Echocardiogram has been performed.  Hannah [name] 09/14/2016, 11:25 AM

## 2016-09-14 NOTE — Progress Notes (Signed)
*  PRELIMINARY RESULTS* Vascular Ultrasound Right lower extremity venous duplex has been completed.  Preliminary findings: technically difficult due to body habitus and edema. No obvious evidence of DVT in the right leg.    Landry Mellow, RDMS, RVT  09/14/2016, 1:45 PM

## 2016-09-14 NOTE — Progress Notes (Signed)
PROGRESS NOTE    Hannah [name]  YOV:[id number] DOB: 12-08-1945 DOA: 09/13/2016 PCP: No PCP Per Patient  Outpatient Specialists:     Brief Narrative:   29 ? Atrial fibrillation [place] score >3 Hypertension Heart failure Diabetes  Originally from [place]-traveled down recently noticing increasing swelling of lower extremities also had a fall CT chest negative for PE showing possible airspace disease Troponin slightly elevated 0.03 BUN/creatinine 25/0.9 without known baseline WBC 8.8, hemoglobin 8 9 platelet 217 INR 2.1 TSH 3.9  Patient non-compliant on lasix, has been off it for a couple of months, has PND,DOE and 3 pillow orthopnea  Assessment & Plan:   Principal Problem:   Community acquired pneumonia Active Problems:   CHF (congestive heart failure) (HCC)   Cellulitis of right lower extremity   1 pneumonia versus decompensated unknown type heart failure-treat both, lactic acid /progcalcitonin to rule out infection, trend WBC, continue IV Lasix 40 twice a day. Home dose 80 daily, stop IV fluids 50 cc per hour.  RPt CXr to clarify diagnosisi 2  atrial fibrillation, [place] score >4-INR therapeutic. Continue amiodarone 200 daily, metoprolol 50 twice a day 3 probable diastolic heart failure and acute respiratory failure form this-BNP on admission 360 consider cutting back metoprolol 50 twice a day. Obtain old records--nursing has been made aware.  baslein weight ~ 310 lbs per patient 4 hypothyroidism-continue 25 g Synthroid daily 5 diabetes mellitus type 2 reasonable to continue metformin. Check CBGs over course of hospital stay on it or for need for sliding scale 6 depression continue BuSpar 7.5 twice a day, Cymbalta 90 daily, trazodone daily at bedtime, Ambien 10 mg daily at bedtime-needs to establish care to clarify medications and the foot similar medications of similar type-continue Xanax 0.5 twice a day anxiety 7  asthma/COPD continue Xyzal 5 mg daily at bedtime  Singulair 10 mg every afternoon 8 ADD-on Strattera 80 mg-probably needs a psychiatrist to establish care -- her medications 9 Body mass index is 52.52 kg/m.-Life-threatening may need weight loss regimen 10 uncontrolled hypertension 11 Varicose veins-unlikelly infection-if PCT and lactic acid neg, d/c abx--no need RLE doppler as leg non tender and not warm 11 marginally elevated troponin-if not having chest pain just monitor. Obesity can cause false positives   DVT prophylaxis: lovenox Code Status: Full Family Communication: d/w daughter on phone 09/14/16 Disposition Plan: 72-96 hrs once diuresed  Consultants:     Procedures:     Antimicrobials:   Levaquin    Subjective:  Well Still sob and cough.  Asking about breathign Rx Has a list of q's No overt cp=no congestion Still SOB  Objective: Vitals:   09/13/16 1958 09/13/16 2048 09/13/16 2112 09/14/16 0432  BP: (!) 169/106 (!) 158/108  (!) 134/95  Pulse: 107 (!) 103  85  Resp: 20 (!) 22  (!) 21  Temp: 97.2 F (36.2 C)  98.4 F (36.9 C) 97.6 F (36.4 C)  TempSrc: Oral   Oral  SpO2: 100% 100%  100%  Weight:  (!) 145.8 kg (321 lb 6.9 oz)  (!) 147.6 kg (325 lb 6.4 oz)  Height:  5\' 6"  (1.676 m)      Intake/Output Summary (Last 24 hours) at 09/14/16 0812 Last data filed at 09/14/16 0200  Gross per 24 hour  Intake           605.83 ml  Output              800 ml  Net          -  194.17 ml   Filed Weights   09/13/16 1313 09/13/16 2048 09/14/16 0432  Weight: (!) 147.4 kg (325 lb) (!) 145.8 kg (321 lb 6.9 oz) (!) 147.6 kg (325 lb 6.4 oz)    Examination:  General exam: Appears calm and comfortable  Respiratory system: Clear to auscultation. Respiratory effort normal. Cardiovascular system: S1 & S2 heard, RRR. Cannot appreciate JVD,no m/r/g Gastrointestinal system: Abdomen is obese, soft nt,nd poor exam Central nervous system: Alert and oriented. No focal neurological deficits. Extremities: Symmetric 5 x 5  power. Skin: 2-3 + edema R leg slighlty more red, not warm Psychiatry: Judgement and insight appear normal. Mood & affect appropriate.     Data Reviewed: I have personally reviewed following labs and imaging studies  CBC:  Recent Labs Lab 09/13/16 1337 09/14/16 0151  WBC 9.4 8.8  NEUTROABS 8.4*  --   HGB 9.6* 8.9*  HCT 31.7* 29.6*  MCV 87.6 87.1  PLT 231 101   Basic Metabolic Panel:  Recent Labs Lab 09/13/16 1337 09/13/16 2200 09/14/16 0151  NA 142  --  141  K 3.3*  --  4.2  CL 108  --  107  CO2 26  --  27  GLUCOSE 117*  --  196*  BUN 30*  --  25*  CREATININE 0.94  --  0.91  CALCIUM 8.8*  --  8.7*  MG  --  1.7  --    GFR: Estimated Creatinine Clearance: 87.1 mL/min (by C-G formula based on SCr of 0.91 mg/dL). Liver Function Tests:  Recent Labs Lab 09/13/16 1337 09/14/16 0151  AST 23 24  ALT 29 27  ALKPHOS 62 61  BILITOT 2.4* 2.0*  PROT 7.9 7.5  ALBUMIN 3.4* 3.1*   No results for input(s): LIPASE, AMYLASE in the last 168 hours. No results for input(s): AMMONIA in the last 168 hours. Coagulation Profile:  Recent Labs Lab 09/13/16 2144 09/14/16 0151  INR 2.11 2.05   Cardiac Enzymes:  Recent Labs Lab 09/13/16 1337 09/13/16 2144 09/14/16 0151  TROPONINI 0.03* <0.03 0.03*   BNP (last 3 results) No results for input(s): PROBNP in the last 8760 hours. HbA1C: No results for input(s): HGBA1C in the last 72 hours. CBG:  Recent Labs Lab 09/13/16 2114 09/14/16 0723  GLUCAP 135* 120*   Lipid Profile: No results for input(s): CHOL, HDL, LDLCALC, TRIG, CHOLHDL, LDLDIRECT in the last 72 hours. Thyroid Function Tests:  Recent Labs  09/13/16 2144  TSH 3.953   Anemia Panel: No results for input(s): VITAMINB12, FOLATE, FERRITIN, TIBC, IRON, RETICCTPCT in the last 72 hours. Urine analysis: No results found for: COLORURINE, APPEARANCEUR, LABSPEC, PHURINE, GLUCOSEU, HGBUR, BILIRUBINUR, KETONESUR, PROTEINUR, UROBILINOGEN, NITRITE,  LEUKOCYTESUR Sepsis Labs: @LABRCNTIP (procalcitonin:4,lacticidven:4)  )No results found for this or any previous visit (from the past 240 hour(s)).       Radiology Studies: Dg Tibia/fibula Left  Result Date: 09/13/2016 CLINICAL DATA:  Lower extremity edema for 2 years. Chronic cellulitis. EXAM: LEFT TIBIA AND FIBULA - 2 VIEW COMPARISON:  None. FINDINGS: Cortical margins of the tibia and fibula are intact. No fracture, periosteal reaction, bony destructive change or abnormal density. There is osteoarthritis of the knee. Subcutaneous edema diffusely through the lower extremity versus habitus. No radiopaque foreign body or soft tissue air. Phleboliths are noted. IMPRESSION: Soft tissue edema without acute osseous abnormality. Electronically Signed   By: Jeb Levering M.D.   On: 09/13/2016 19:55   Dg Tibia/fibula Right  Result Date: 09/13/2016 CLINICAL DATA:  Bilateral lower extremity edema  for 2 years. Cellulitis. EXAM: RIGHT TIBIA AND FIBULA - 2 VIEW COMPARISON:  None. FINDINGS: Cortical margins of the tibia and fibula are intact. No fracture, periosteal reaction, bony destructive change or abnormal density. There is osteoarthritis of the knee joint. Diffuse soft tissue edema of the lower extremity versus habitus. Scattered phleboliths. No radiopaque foreign body or soft tissue air. IMPRESSION: Soft tissue edema without acute osseous abnormality. Electronically Signed   By: Jeb Levering M.D.   On: 09/13/2016 19:56   Ct Angio Chest Pe W Or Wo Contrast  Result Date: 09/13/2016 CLINICAL DATA:  Back pain since falling yesterday. Edema in lower legs. Shortness of breath. EXAM: CT ANGIOGRAPHY CHEST WITH CONTRAST TECHNIQUE: Multidetector CT imaging of the chest was performed using the standard protocol during bolus administration of intravenous contrast. Multiplanar CT image reconstructions and MIPs were obtained to evaluate the vascular anatomy. CONTRAST:  100 mL of Isovue 370 COMPARISON:  Chest  x-ray from earlier today FINDINGS: Cardiovascular: Cardiomegaly is identified. There are coronary artery calcifications, particularly in the left coronary arteries. The thoracic aorta is normal in caliber with no dissection. No pulmonary emboli identified. Mediastinum/Nodes: Shotty nodes are seen in the mediastinum. A representative right paratracheal node on axial image 21 measures 13 x 12 mm in short axis. These nodes may simply be reactive given the findings in the lungs. Mildly enlarged node in the left hilum. No effusions. The esophagus and limited views of the thyroid are normal. Lungs/Pleura: Central airways are normal. No pneumothorax. Mild air trapping in the lungs. Minimal interlobular septal thickening in the apices may represent minimal edema. There is a small dense nodule in the late apex measuring 5.1 mm on coronal image 71. Scattered pulmonary opacities in the lung bases, left greater than right. While there is a component of dependent atelectasis, the findings are consistent with bibasilar infiltrates. There are nodular components which are ground-glass in attenuation. Two such nodules are identified on axial image 36. The larger in the right upper lobe measures 14 mm well the smaller in the superior segment of the right lower lobe measures 8 mm. A somewhat nodular region of opacity is seen posteriorly in the superior segment of the left lower lobe on axial image 51. No masses. Upper Abdomen: No acute abnormality. Musculoskeletal: No chest wall abnormality. No acute or significant osseous findings. Review of the MIP images confirms the above findings. IMPRESSION: 1. No pulmonary emboli. 2. Patchy infiltrates are seen in the lungs bilaterally, most prominent in the left lung base. A few ground-glass nodules, as described above, are probably part of the infiltrative process. I suspect an infectious or inflammatory process. Recommend short-term follow-up CT scan after treatment to ensure resolution. 3.  Mild edema.  Mild air trapping. 4. Mild mediastinal and left hilar adenopathy, probably reactive. Recommend attention on follow-up. Electronically Signed   By: Dorise Bullion III M.D   On: 09/13/2016 18:38   Dg Chest Port 1 View  Result Date: 09/13/2016 CLINICAL DATA:  Shortness of breath, cough. EXAM: PORTABLE CHEST 1 VIEW COMPARISON:  None. FINDINGS: Mild cardiomegaly is noted. No pneumothorax or pleural effusion is noted. Both lungs are clear. The visualized skeletal structures are unremarkable. IMPRESSION: No acute cardiopulmonary abnormality seen. Electronically Signed   By: Marijo Conception, M.D.   On: 09/13/2016 13:47        Scheduled Meds: . amiodarone  200 mg Oral Daily  . atomoxetine  80 mg Oral QAC breakfast  . DULoxetine  90 mg Oral  Daily  . furosemide  40 mg Intravenous BID  . insulin aspart  0-15 Units Subcutaneous TID WC  . insulin aspart  0-5 Units Subcutaneous QHS  . levofloxacin (LEVAQUIN) IV  750 mg Intravenous Q24H  . levothyroxine  25 mcg Oral QAC breakfast  . loratadine  10 mg Oral QPM  . mouth rinse  15 mL Mouth Rinse BID  . metFORMIN  500 mg Oral Q breakfast  . metoprolol  50 mg Oral BID  . montelukast  10 mg Oral QHS  . pantoprazole  40 mg Oral Daily  . potassium chloride  40 mEq Oral TID  . sodium chloride flush  3 mL Intravenous Q12H  . traZODone  50-100 mg Oral QHS  . warfarin  5 mg Oral q1800  . Warfarin - Pharmacist Dosing Inpatient   Does not apply q1800   Continuous Infusions: . sodium chloride 50 mL/hr at 09/13/16 2117     LOS: 1 day    Time spent: [place], MD [facility] ([facility]   If 7PM-7AM, please contact night-coverage [email or web address] Password [email or web address] 09/14/2016, 8:12 AM

## 2016-09-14 NOTE — Progress Notes (Signed)
ANTICOAGULATION CONSULT NOTE - Initial Consult  Pharmacy Consult for warfarin Indication: atrial fibrillation  Allergies  Allergen Reactions  . Penicillins     Possibility; father and brother allergic     Patient Measurements: Height: 5\' 6"  (167.6 cm) Weight: (!) 325 lb 6.4 oz (147.6 kg) IBW/kg (Calculated) : 59.3  Vital Signs: Temp: 97.6 F (36.4 C) (03/04 0432) Temp Source: Oral (03/04 0432) BP: 134/95 (03/04 0432) Pulse Rate: 85 (03/04 0432)  Labs:  Recent Labs  09/13/16 1337 09/13/16 2144 09/14/16 0151 09/14/16 0805  HGB 9.6*  --  8.9*  --   HCT 31.7*  --  29.6*  --   PLT 231  --  217  --   APTT  --  32  --   --   LABPROT  --  24.0* 23.5*  --   INR  --  2.11 2.05  --   CREATININE 0.94  --  0.91  --   TROPONINI 0.03* <0.03 0.03* <0.03    Estimated Creatinine Clearance: 87.1 mL/min (by C-G formula based on SCr of 0.91 mg/dL).   Medical History: Past Medical History:  Diagnosis Date  . Atrial fibrillation ([address])   . Diabetes mellitus without complication ([place])   . Glaucoma    BILATERAL  . Hypertension   . Thyroid disease     Medications:  Prescriptions Prior to Admission  Medication Sig Dispense Refill Last Dose  . ALPRAZolam (XANAX) 0.5 MG tablet Take 0.5 mg by mouth 2 (two) times daily as needed for anxiety.   09/12/2016 at Unknown time  . amiodarone (PACERONE) 200 MG tablet Take 200 mg by mouth daily.   09/12/2016 at Unknown time  . atomoxetine (STRATTERA) 80 MG capsule Take 80 mg by mouth daily.   09/12/2016 at Unknown time  . busPIRone (BUSPAR) 15 MG tablet Take 7.5 mg by mouth 2 (two) times daily.   09/12/2016 at Unknown time  . DULoxetine (CYMBALTA) 30 MG capsule Take 90 mg by mouth daily.   09/12/2016 at Unknown time  . esomeprazole (NEXIUM) 40 MG capsule Take 40 mg by mouth daily.   09/12/2016 at Unknown time  . furosemide (LASIX) 80 MG tablet Take 80 mg by mouth daily.   Past Week at Unknown time  . levocetirizine (XYZAL) 5 MG tablet Take 5 mg by mouth  every evening.   09/12/2016 at Unknown time  . levothyroxine (SYNTHROID, LEVOTHROID) 25 MCG tablet Take 25 mcg by mouth daily before breakfast.   Unknown  . metFORMIN (GLUCOPHAGE) 500 MG tablet Take 500 mg by mouth daily with breakfast.   09/12/2016 at Unknown time  . metoprolol (LOPRESSOR) 50 MG tablet Take 50 mg by mouth 2 (two) times daily.   09/12/2016 at Unknown time  . montelukast (SINGULAIR) 10 MG tablet Take 10 mg by mouth every evening.   09/12/2016 at Unknown time  . Potassium Chloride ER 20 MEQ TBCR Take 20 mEq by mouth daily.   Past Week at Unknown time  . traZODone (DESYREL) 50 MG tablet Take 50-100 mg by mouth at bedtime.   09/12/2016 at Unknown time  . warfarin (COUMADIN) 5 MG tablet Take 5 mg by mouth daily.   09/12/2016 at Unknown time  . zolpidem (AMBIEN) 10 MG tablet Take 10 mg by mouth at bedtime.   09/12/2016 at Unknown time   Scheduled:  . amiodarone  200 mg Oral Daily  . atomoxetine  80 mg Oral QAC breakfast  . DULoxetine  90 mg Oral Daily  .  furosemide  40 mg Intravenous BID  . insulin aspart  0-15 Units Subcutaneous TID WC  . insulin aspart  0-5 Units Subcutaneous QHS  . levofloxacin (LEVAQUIN) IV  750 mg Intravenous Q24H  . levothyroxine  25 mcg Oral QAC breakfast  . loratadine  10 mg Oral QPM  . mouth rinse  15 mL Mouth Rinse BID  . metFORMIN  500 mg Oral Q breakfast  . metoprolol  50 mg Oral BID  . montelukast  10 mg Oral QHS  . pantoprazole  40 mg Oral Daily  . potassium chloride  40 mEq Oral TID  . sodium chloride flush  3 mL Intravenous Q12H  . traZODone  50-100 mg Oral QHS  . warfarin  5 mg Oral q1800  . Warfarin - Pharmacist Dosing Inpatient   Does not apply q1800    Assessment: 45 yoF with PMH Afib on warfarin and amiodarone, DM, CHF, thyroid disease, HTN, presenting 3/3 with cough, SOB, and swelling/redness of LE. Admitting for CAP, cellulitis, and mild AECHF. Pharmacy to dose warfarin while admitted.   Baseline INR therapeutic  Prior anticoagulation:  warfarin 5 mg daily, last dose 3/2  Significant events: 3/4 - given warfarin early in AM to make up for missed dose 3/3  Today, 09/14/2016:  CBC: Hgb low but stable; Plt wnl  INR therapeutic but dropping  Major drug interactions: on amio at home, starting Levaquin  No bleeding issues per nursing  Full diet ordered  Goal of Therapy: INR 2-3  Plan:  Warfarin 4 mg PO tonight at 18:00 - using slightly lower dose as INR dropping, received yesterday's dose this AM, and now with acute illness, broad spectrum abx, and possible hepatic congestion with CHF  Daily INR  CBC at least q72 hr while on warfarin  Monitor for signs of bleeding or thrombosis   Reuel Boom, PharmD Pager: [phone] 09/14/2016, 10:50 AM

## 2016-09-14 NOTE — Progress Notes (Signed)
ANTICOAGULATION CONSULT NOTE - Initial Consult  Pharmacy Consult for warfarin Indication: atrial fibrillation  Allergies  Allergen Reactions  . Penicillins     Possibility; father and brother allergic     Patient Measurements: Height: 5\' 6"  (167.6 cm) Weight: (!) 321 lb 6.9 oz (145.8 kg) IBW/kg (Calculated) : 59.3 Heparin Dosing Weight:   Vital Signs: Temp: 98.4 F (36.9 C) (03/03 2112) Temp Source: Oral (03/03 1958) BP: 158/108 (03/03 2048) Pulse Rate: 103 (03/03 2048)  Labs:  Recent Labs  09/13/16 1337 09/13/16 2144  HGB 9.6*  --   HCT 31.7*  --   PLT 231  --   APTT  --  32  LABPROT  --  24.0*  INR  --  2.11  CREATININE 0.94  --   TROPONINI 0.03* <0.03    Estimated Creatinine Clearance: 83.7 mL/min (by C-G formula based on SCr of 0.94 mg/dL).   Medical History: Past Medical History:  Diagnosis Date  . Atrial fibrillation ([place])   . Diabetes mellitus without complication ([place])   . Glaucoma    BILATERAL  . Hypertension   . Thyroid disease     Medications:  Prescriptions Prior to Admission  Medication Sig Dispense Refill Last Dose  . ALPRAZolam (XANAX) 0.5 MG tablet Take 0.5 mg by mouth 2 (two) times daily as needed for anxiety.   09/12/2016 at Unknown time  . amiodarone (PACERONE) 200 MG tablet Take 200 mg by mouth daily.   09/12/2016 at Unknown time  . atomoxetine (STRATTERA) 80 MG capsule Take 80 mg by mouth daily.   09/12/2016 at Unknown time  . busPIRone (BUSPAR) 15 MG tablet Take 7.5 mg by mouth 2 (two) times daily.   09/12/2016 at Unknown time  . DULoxetine (CYMBALTA) 30 MG capsule Take 90 mg by mouth daily.   09/12/2016 at Unknown time  . esomeprazole (NEXIUM) 40 MG capsule Take 40 mg by mouth daily.   09/12/2016 at Unknown time  . furosemide (LASIX) 80 MG tablet Take 80 mg by mouth daily.   Past Week at Unknown time  . levocetirizine (XYZAL) 5 MG tablet Take 5 mg by mouth every evening.   09/12/2016 at Unknown time  . levothyroxine (SYNTHROID, LEVOTHROID) 25  MCG tablet Take 25 mcg by mouth daily before breakfast.   Unknown  . metFORMIN (GLUCOPHAGE) 500 MG tablet Take 500 mg by mouth daily with breakfast.   09/12/2016 at Unknown time  . metoprolol (LOPRESSOR) 50 MG tablet Take 50 mg by mouth 2 (two) times daily.   09/12/2016 at Unknown time  . montelukast (SINGULAIR) 10 MG tablet Take 10 mg by mouth every evening.   09/12/2016 at Unknown time  . Potassium Chloride ER 20 MEQ TBCR Take 20 mEq by mouth daily.   Past Week at Unknown time  . traZODone (DESYREL) 50 MG tablet Take 50-100 mg by mouth at bedtime.   09/12/2016 at Unknown time  . warfarin (COUMADIN) 5 MG tablet Take 5 mg by mouth daily.   09/12/2016 at Unknown time  . zolpidem (AMBIEN) 10 MG tablet Take 10 mg by mouth at bedtime.   09/12/2016 at Unknown time   Scheduled:  . amiodarone  200 mg Oral Daily  . atomoxetine  80 mg Oral QAC breakfast  . DULoxetine  90 mg Oral Daily  . furosemide  40 mg Intravenous BID  . insulin aspart  0-15 Units Subcutaneous TID WC  . insulin aspart  0-5 Units Subcutaneous QHS  . levofloxacin (LEVAQUIN) IV  750  mg Intravenous Q24H  . levothyroxine  25 mcg Oral QAC breakfast  . loratadine  10 mg Oral QPM  . metFORMIN  500 mg Oral Q breakfast  . metoprolol  50 mg Oral BID  . montelukast  10 mg Oral QHS  . pantoprazole  40 mg Oral Daily  . potassium chloride  40 mEq Oral TID  . sodium chloride flush  3 mL Intravenous Q12H  . traZODone  50-100 mg Oral QHS    Assessment: Patient afib and chronic warfarin.  INR on admit 2.11.    Goal of Therapy:  INR 2-3    Plan:  Daily INR Continue home warfarin dosing.  Will give dose in AM to make for missed 3/3 dose  Tyler Deis, Shea Stakes [place] 09/14/2016,1:49 AM

## 2016-09-14 NOTE — Progress Notes (Signed)
CRITICAL VALUE ALERT  Critical value received:  Troponin 0.03  Date of notification:  09/14/16  Time of notification:  0312  Critical value read back:Yes.    Nurse who received alert:  Reynold Bowen, Rn   MD notified (1st page): D. Olevia Bowens  Time of first page:  0328  MD notified (2nd page):  Time of second page:  Responding MD:  Abundio Miu  Time MD responded:  [phone]

## 2016-09-15 LAB — COMPREHENSIVE METABOLIC PANEL
ALK PHOS: 58 U/L (ref 38–126)
ALT: 24 U/L (ref 14–54)
ANION GAP: 8 (ref 5–15)
AST: 17 U/L (ref 15–41)
Albumin: 3.1 g/dL — ABNORMAL LOW (ref 3.5–5.0)
BILIRUBIN TOTAL: 1.3 mg/dL — AB (ref 0.3–1.2)
BUN: 35 mg/dL — ABNORMAL HIGH (ref 6–20)
CALCIUM: 8.9 mg/dL (ref 8.9–10.3)
CO2: 29 mmol/L (ref 22–32)
Chloride: 103 mmol/L (ref 101–111)
Creatinine, Ser: 0.96 mg/dL (ref 0.44–1.00)
GFR, EST NON AFRICAN AMERICAN: 59 mL/min — AB (ref >60)
GLUCOSE: 97 mg/dL (ref 65–99)
POTASSIUM: 4.3 mmol/L (ref 3.5–5.1)
Sodium: 140 mmol/L (ref 135–145)
TOTAL PROTEIN: 7.5 g/dL (ref 6.5–8.1)

## 2016-09-15 LAB — GLUCOSE, CAPILLARY
GLUCOSE-CAPILLARY: 83 mg/dL (ref 65–99)
GLUCOSE-CAPILLARY: 100 mg/dL — AB (ref 65–99)
GLUCOSE-CAPILLARY: 103 mg/dL — AB (ref 65–99)
Glucose-Capillary: 93 mg/dL (ref 65–99)

## 2016-09-15 LAB — CBC
HEMATOCRIT: 30.9 % — AB (ref 36.0–46.0)
HEMOGLOBIN: 9.2 g/dL — AB (ref 12.0–15.0)
MCH: 26.4 pg (ref 26.0–34.0)
MCHC: 29.8 g/dL — AB (ref 30.0–36.0)
MCV: 88.8 fL (ref 78.0–100.0)
Platelets: 257 10*3/uL (ref 150–400)
RBC: 3.48 MIL/uL — ABNORMAL LOW (ref 3.87–5.11)
RDW: 17.1 % — ABNORMAL HIGH (ref 11.5–15.5)
WBC: 11.5 10*3/uL — ABNORMAL HIGH (ref 4.0–10.5)

## 2016-09-15 LAB — PROTIME-INR
INR: 2.32
PROTHROMBIN TIME: 25.9 s — AB (ref 11.4–15.2)

## 2016-09-15 LAB — HEMOGLOBIN A1C
Hgb A1c MFr Bld: 6 % — ABNORMAL HIGH (ref 4.8–5.6)
Mean Plasma Glucose: 126 mg/dL

## 2016-09-15 MED ORDER — ALUM & MAG HYDROXIDE-SIMETH 200-200-20 MG/5ML PO SUSP: 30.0000 mL | Freq: Four times a day (QID) | ORAL | Status: DC | PRN

## 2016-09-15 MED ORDER — LEVALBUTEROL HCL 0.63 MG/3ML IN NEBU
0.6300 mg | INHALATION_SOLUTION | Freq: Three times a day (TID) | RESPIRATORY_TRACT | Status: DC
  Administered 2016-09-15 – 2016-09-18 (×11): 0.63 mg via RESPIRATORY_TRACT
  Filled 2016-09-15 (×11): qty 3

## 2016-09-15 MED ORDER — FUROSEMIDE 10 MG/ML IJ SOLN
60.0000 mg | Freq: Two times a day (BID) | INTRAMUSCULAR | Status: DC
  Administered 2016-09-15 – 2016-09-16 (×3): 60 mg via INTRAVENOUS
  Filled 2016-09-15 (×3): qty 6

## 2016-09-15 MED ORDER — WARFARIN SODIUM 2.5 MG PO TABS
2.5000 mg | ORAL_TABLET | Freq: Once | ORAL | Status: AC
  Administered 2016-09-15: 2.5 mg via ORAL
  Filled 2016-09-15: qty 1

## 2016-09-15 MED ORDER — LEVALBUTEROL HCL 0.63 MG/3ML IN NEBU: 0.6300 mg | INHALATION_SOLUTION | Freq: Four times a day (QID) | RESPIRATORY_TRACT | Status: DC | PRN

## 2016-09-15 NOTE — Progress Notes (Signed)
[place] for warfarin Indication: atrial fibrillation  Allergies  Allergen Reactions  . Penicillins     Possibility; father and brother allergic     Patient Measurements: Height: 5\' 6"  (167.6 cm) Weight: (!) 325 lb 6.4 oz (147.6 kg) IBW/kg (Calculated) : 59.3  Vital Signs: Temp: 97.8 F (36.6 C) (03/05 0402) Temp Source: Oral (03/05 0402) BP: 120/72 (03/05 0402) Pulse Rate: 84 (03/05 0402)  Labs:  Recent Labs  09/13/16 1337 09/13/16 2144 09/14/16 0151 09/14/16 0805 09/15/16 0521  HGB 9.6*  --  8.9*  --  9.2*  HCT 31.7*  --  29.6*  --  30.9*  PLT 231  --  217  --  257  APTT  --  32  --   --   --   LABPROT  --  24.0* 23.5*  --  25.9*  INR  --  2.11 2.05  --  2.32  CREATININE 0.94  --  0.91  --  0.96  TROPONINI 0.03* <0.03 0.03* <0.03  --     Estimated Creatinine Clearance: 82.6 mL/min (by C-G formula based on SCr of 0.96 mg/dL).   Medical History: Past Medical History:  Diagnosis Date  . Atrial fibrillation ([place])   . Diabetes mellitus without complication ([place])   . Glaucoma    BILATERAL  . Hypertension   . Thyroid disease     Assessment: 81 yoF with PMH Afib on warfarin and amiodarone, DM, CHF, thyroid disease, HTN, presenting 3/3 with cough, SOB, and swelling/redness of LE. Admitting for CAP, cellulitis, and mild AECHF. Pharmacy to dose warfarin while admitted.   Baseline INR therapeutic  Prior anticoagulation: warfarin 5 mg daily, last dose 3/2  Significant events: 3/4 - given warfarin early in AM to make up for missed dose 3/3  Today, 09/15/2016:  INR therapeutic, trending up despite lower dose last evening (BUT patient also given dose in am since missed 3/3 dosed)  CBC: Hgb low but stable; Plt wnl  Major drug interactions: on amio at home, starting Levaquin  No bleeding issues per nursing  Full diet ordered  Goal of Therapy: INR 2-3  Plan:  Warfarin 2.5mg  PO x 1 for INR rise overnight  Daily  INR  CBC at least q72 hr while on warfarin  Monitor for signs of bleeding or thrombosis  Doreene Eland, PharmD, BCPS.   Pager: [phone] 09/15/2016 10:51 AM

## 2016-09-15 NOTE — Progress Notes (Signed)
PROGRESS NOTE    Hannah [name]  LXB:[id number] DOB: Mar 24, 1946 DOA: 09/13/2016 PCP: No PCP Per Patient  Outpatient Specialists:     Brief Narrative:   53 ? Atrial fibrillation [place] score >3 Hypertension Heart failure Diabetes  Originally from [place]-traveled down recently noticing increasing swelling of lower extremities also had a fall CT chest negative for PE showing possible airspace disease Troponin slightly elevated 0.03 BUN/creatinine 25/0.9 without known baseline WBC 8.8, hemoglobin 8 9 platelet 217 INR 2.1 TSH 3.9  Patient non-compliant on lasix, has been off it for a couple of months, has PND,DOE and 3 pillow orthopnea  Assessment & Plan:   Principal Problem:   Community acquired pneumonia Active Problems:   CHF (congestive heart failure) (HCC)   Cellulitis of right lower extremity   1 pneumonia versus decompensated unknown type heart failure-treat both, lactic acid 1.9, trend WBC, continue IV Lasix 40 twice a day. Home dose 80 daily, stop IV fluids 50 cc per hour. CXR 3/4 not sensitive for CT findings seen previously--cont Abx todfay as WBC ? -monitor for Fever spike 2  atrial fibrillation, [place] score >4-INR therapeutic. Continue amiodarone 200 daily, metoprolol 50 twice a day 3 probable diastolic heart failure and acute respiratory failure form this-BNP on admission 360 consider cutting back metoprolol 50 twice a day. Diuresis increased from 40 twice a day Lasix--60 twice a day. Unclear I/o accurate? Now -1.7 L.  baslein weight ~ 310 lbs per patient 4 hypothyroidism-continue 25 g Synthroid daily 5 diabetes mellitus type 2 reasonable to continue metformin. All CBG <100 6 depression continue BuSpar 7.5 twice a day, Cymbalta 90 daily, trazodone daily at bedtime, Ambien 10 mg daily at bedtime-needs to establish care to clarify medications and the foot similar medications of similar type-continue Xanax 0.5 twice a day anxiety 7  asthma/COPD continue Xyzal 5 mg  daily at bedtime Singulair 10 mg every afternoon 8 ADD-on Strattera 80 mg-probably needs a psychiatrist to establish care -- her medications 9 Body mass index is 52.52 kg/m.-Life-threatening may need weight loss regimen 10 uncontrolled hypertension-monitor 11 Varicose veins-unlikelly infection-if PCT and lactic acid neg, d/c abx--Doppler not sensitive but grossly not abnormal--Unna Boots 11 marginally elevated troponin-if not having chest pain just monitor. Obesity can cause false positives   DVT prophylaxis: lovenox Code Status: Full Family Communication: d/w daughter on phone 09/14/16 Disposition Plan: 72-96 hrs once diuresed  Consultants:     Procedures:     Antimicrobials:   Levaquin    Subjective:  Coughing still  Some sputum Feels fair only No other issue sMedical records not available yet  Objective: Vitals:   09/14/16 1545 09/14/16 2006 09/15/16 0339 09/15/16 0402  BP:  (!) 157/105  120/72  Pulse:  94  84  Resp:  18  19  Temp:  97.4 F (36.3 C)  97.8 F (36.6 C)  TempSrc:  Oral  Oral  SpO2: 99% 99% 97% 95%  Weight:      Height:        Intake/Output Summary (Last 24 hours) at 09/15/16 0748 Last data filed at 09/14/16 2332  Gross per 24 hour  Intake              150 ml  Output              400 ml  Net             -250 ml   Filed Weights   09/13/16 1313 09/13/16 2048 09/14/16 0432  Weight: Marland Kitchen)  147.4 kg (325 lb) (!) 145.8 kg (321 lb 6.9 oz) (!) 147.6 kg (325 lb 6.4 oz)    Examination:  General exam: fair Respiratory system: Clear to auscultation. Respiratory effort ?   with wheeze Cardiovascular system: S1 & S2 heard, RRR. Cannot appreciate JVD,no m/r/g Gastrointestinal system: Abdomen is obese, soft nt,nd poor exam Central nervous system: Alert and oriented. No focal neurological deficits. Extremities: Symmetric 5 x 5 power. Skin: 2-3 + edema R leg still, not warm Psychiatry: Judgement and insight appear normal. Mood & affect appropriate.      Data Reviewed: I have personally reviewed following labs and imaging studies  CBC:  Recent Labs Lab 09/13/16 1337 09/14/16 0151 09/15/16 0521  WBC 9.4 8.8 11.5*  NEUTROABS 8.4*  --   --   HGB 9.6* 8.9* 9.2*  HCT 31.7* 29.6* 30.9*  MCV 87.6 87.1 88.8  PLT 231 217 657   Basic Metabolic Panel:  Recent Labs Lab 09/13/16 1337 09/13/16 2200 09/14/16 0151 09/15/16 0521  NA 142  --  141 140  K 3.3*  --  4.2 4.3  CL 108  --  107 103  CO2 26  --  27 29  GLUCOSE 117*  --  196* 97  BUN 30*  --  25* 35*  CREATININE 0.94  --  0.91 0.96  CALCIUM 8.8*  --  8.7* 8.9  MG  --  1.7  --   --    GFR: Estimated Creatinine Clearance: 82.6 mL/min (by C-G formula based on SCr of 0.96 mg/dL). Liver Function Tests:  Recent Labs Lab 09/13/16 1337 09/14/16 0151 09/15/16 0521  AST 23 24 17   ALT 29 27 24   ALKPHOS 62 61 58  BILITOT 2.4* 2.0* 1.3*  PROT 7.9 7.5 7.5  ALBUMIN 3.4* 3.1* 3.1*   No results for input(s): LIPASE, AMYLASE in the last 168 hours. No results for input(s): AMMONIA in the last 168 hours. Coagulation Profile:  Recent Labs Lab 09/13/16 2144 09/14/16 0151 09/15/16 0521  INR 2.11 2.05 2.32   Cardiac Enzymes:  Recent Labs Lab 09/13/16 1337 09/13/16 2144 09/14/16 0151 09/14/16 0805  TROPONINI 0.03* <0.03 0.03* <0.03   BNP (last 3 results) No results for input(s): PROBNP in the last 8760 hours. HbA1C: No results for input(s): HGBA1C in the last 72 hours. CBG:  Recent Labs Lab 09/14/16 0723 09/14/16 1128 09/14/16 1634 09/14/16 2055 09/15/16 0722  GLUCAP 120* 156* 143* 99 83   Lipid Profile: No results for input(s): CHOL, HDL, LDLCALC, TRIG, CHOLHDL, LDLDIRECT in the last 72 hours. Thyroid Function Tests:  Recent Labs  09/13/16 2144  TSH 3.953   Anemia Panel: No results for input(s): VITAMINB12, FOLATE, FERRITIN, TIBC, IRON, RETICCTPCT in the last 72 hours. Urine analysis: No results found for: COLORURINE, APPEARANCEUR, LABSPEC,  PHURINE, GLUCOSEU, HGBUR, BILIRUBINUR, KETONESUR, PROTEINUR, UROBILINOGEN, NITRITE, LEUKOCYTESUR Sepsis Labs: @LABRCNTIP (procalcitonin:4,lacticidven:4)  )No results found for this or any previous visit (from the past 240 hour(s)).       Radiology Studies: Dg Tibia/fibula Left  Result Date: 09/13/2016 CLINICAL DATA:  Lower extremity edema for 2 years. Chronic cellulitis. EXAM: LEFT TIBIA AND FIBULA - 2 VIEW COMPARISON:  None. FINDINGS: Cortical margins of the tibia and fibula are intact. No fracture, periosteal reaction, bony destructive change or abnormal density. There is osteoarthritis of the knee. Subcutaneous edema diffusely through the lower extremity versus habitus. No radiopaque foreign body or soft tissue air. Phleboliths are noted. IMPRESSION: Soft tissue edema without acute osseous abnormality. Electronically Signed  By: Jeb Levering M.D.   On: 09/13/2016 19:55   Dg Tibia/fibula Right  Result Date: 09/13/2016 CLINICAL DATA:  Bilateral lower extremity edema for 2 years. Cellulitis. EXAM: RIGHT TIBIA AND FIBULA - 2 VIEW COMPARISON:  None. FINDINGS: Cortical margins of the tibia and fibula are intact. No fracture, periosteal reaction, bony destructive change or abnormal density. There is osteoarthritis of the knee joint. Diffuse soft tissue edema of the lower extremity versus habitus. Scattered phleboliths. No radiopaque foreign body or soft tissue air. IMPRESSION: Soft tissue edema without acute osseous abnormality. Electronically Signed   By: Jeb Levering M.D.   On: 09/13/2016 19:56   Ct Angio Chest Pe W Or Wo Contrast  Result Date: 09/13/2016 CLINICAL DATA:  Back pain since falling yesterday. Edema in lower legs. Shortness of breath. EXAM: CT ANGIOGRAPHY CHEST WITH CONTRAST TECHNIQUE: Multidetector CT imaging of the chest was performed using the standard protocol during bolus administration of intravenous contrast. Multiplanar CT image reconstructions and MIPs were obtained to  evaluate the vascular anatomy. CONTRAST:  100 mL of Isovue 370 COMPARISON:  Chest x-ray from earlier today FINDINGS: Cardiovascular: Cardiomegaly is identified. There are coronary artery calcifications, particularly in the left coronary arteries. The thoracic aorta is normal in caliber with no dissection. No pulmonary emboli identified. Mediastinum/Nodes: Shotty nodes are seen in the mediastinum. A representative right paratracheal node on axial image 21 measures 13 x 12 mm in short axis. These nodes may simply be reactive given the findings in the lungs. Mildly enlarged node in the left hilum. No effusions. The esophagus and limited views of the thyroid are normal. Lungs/Pleura: Central airways are normal. No pneumothorax. Mild air trapping in the lungs. Minimal interlobular septal thickening in the apices may represent minimal edema. There is a small dense nodule in the late apex measuring 5.1 mm on coronal image 71. Scattered pulmonary opacities in the lung bases, left greater than right. While there is a component of dependent atelectasis, the findings are consistent with bibasilar infiltrates. There are nodular components which are ground-glass in attenuation. Two such nodules are identified on axial image 36. The larger in the right upper lobe measures 14 mm well the smaller in the superior segment of the right lower lobe measures 8 mm. A somewhat nodular region of opacity is seen posteriorly in the superior segment of the left lower lobe on axial image 51. No masses. Upper Abdomen: No acute abnormality. Musculoskeletal: No chest wall abnormality. No acute or significant osseous findings. Review of the MIP images confirms the above findings. IMPRESSION: 1. No pulmonary emboli. 2. Patchy infiltrates are seen in the lungs bilaterally, most prominent in the left lung base. A few ground-glass nodules, as described above, are probably part of the infiltrative process. I suspect an infectious or inflammatory  process. Recommend short-term follow-up CT scan after treatment to ensure resolution. 3. Mild edema.  Mild air trapping. 4. Mild mediastinal and left hilar adenopathy, probably reactive. Recommend attention on follow-up. Electronically Signed   By: Dorise Bullion III M.D   On: 09/13/2016 18:38   Dg Chest Port 1 View  Result Date: 09/14/2016 CLINICAL DATA:  Productive cough for 2 weeks EXAM: PORTABLE CHEST 1 VIEW COMPARISON:  Yesterday FINDINGS: Cardiomegaly and probable pulmonary hypertension. Implanted loop recorder noted. Patchy airspace disease seen by yesterday's CT is not clearly visible radiographically. There is no edema, consolidation, effusion, or pneumothorax. IMPRESSION: 1. Stable compared to yesterday. Patient's patchy airspace disease by CT is not visible radiographically. 2. Cardiomegaly  and probable pulmonary hypertension. Electronically Signed   By: Monte Fantasia M.D.   On: 09/14/2016 14:31   Dg Chest Port 1 View  Result Date: 09/13/2016 CLINICAL DATA:  Shortness of breath, cough. EXAM: PORTABLE CHEST 1 VIEW COMPARISON:  None. FINDINGS: Mild cardiomegaly is noted. No pneumothorax or pleural effusion is noted. Both lungs are clear. The visualized skeletal structures are unremarkable. IMPRESSION: No acute cardiopulmonary abnormality seen. Electronically Signed   By: Marijo Conception, M.D.   On: 09/13/2016 13:47        Scheduled Meds: . amiodarone  200 mg Oral Daily  . atomoxetine  80 mg Oral QAC breakfast  . DULoxetine  90 mg Oral Daily  . furosemide  40 mg Intravenous BID  . insulin aspart  0-15 Units Subcutaneous TID WC  . insulin aspart  0-5 Units Subcutaneous QHS  . levalbuterol  0.63 mg Nebulization TID  . levofloxacin (LEVAQUIN) IV  750 mg Intravenous Q24H  . levothyroxine  25 mcg Oral QAC breakfast  . loratadine  10 mg Oral QPM  . mouth rinse  15 mL Mouth Rinse BID  . metFORMIN  500 mg Oral Q breakfast  . metoprolol  50 mg Oral BID  . montelukast  10 mg Oral QHS  .  pantoprazole  40 mg Oral Daily  . sodium chloride flush  3 mL Intravenous Q12H  . traZODone  50-100 mg Oral QHS  . Warfarin - Pharmacist Dosing Inpatient   Does not apply q1800   Continuous Infusions:    LOS: 2 days    Time spent: [place], MD [facility] ([facility]   If 7PM-7AM, please contact night-coverage [email or web address] Password [facility] 09/15/2016, 7:48 AM

## 2016-09-16 LAB — CBC
HEMATOCRIT: 29.8 % — AB (ref 36.0–46.0)
HEMOGLOBIN: 8.9 g/dL — AB (ref 12.0–15.0)
MCH: 26.3 pg (ref 26.0–34.0)
MCHC: 29.9 g/dL — ABNORMAL LOW (ref 30.0–36.0)
MCV: 88.2 fL (ref 78.0–100.0)
PLATELETS: 230 10*3/uL (ref 150–400)
RBC: 3.38 MIL/uL — AB (ref 3.87–5.11)
RDW: 16.7 % — ABNORMAL HIGH (ref 11.5–15.5)
WBC: 7.8 10*3/uL (ref 4.0–10.5)

## 2016-09-16 LAB — COMPREHENSIVE METABOLIC PANEL
ALK PHOS: 52 U/L (ref 38–126)
ALT: 21 U/L (ref 14–54)
AST: 17 U/L (ref 15–41)
Albumin: 2.9 g/dL — ABNORMAL LOW (ref 3.5–5.0)
Anion gap: 6 (ref 5–15)
BUN: 41 mg/dL — AB (ref 6–20)
CALCIUM: 8.7 mg/dL — AB (ref 8.9–10.3)
CHLORIDE: 100 mmol/L — AB (ref 101–111)
CO2: 32 mmol/L (ref 22–32)
CREATININE: 1.19 mg/dL — AB (ref 0.44–1.00)
GFR calc Af Amer: 53 mL/min — ABNORMAL LOW (ref >60)
GFR, EST NON AFRICAN AMERICAN: 45 mL/min — AB (ref >60)
Glucose, Bld: 152 mg/dL — ABNORMAL HIGH (ref 65–99)
Potassium: 4.2 mmol/L (ref 3.5–5.1)
Sodium: 138 mmol/L (ref 135–145)
Total Bilirubin: 1.2 mg/dL (ref 0.3–1.2)
Total Protein: 6.8 g/dL (ref 6.5–8.1)

## 2016-09-16 LAB — PROTIME-INR
INR: 2.52
PROTHROMBIN TIME: 27.7 s — AB (ref 11.4–15.2)

## 2016-09-16 LAB — PROCALCITONIN

## 2016-09-16 LAB — GLUCOSE, CAPILLARY
Glucose-Capillary: 119 mg/dL — ABNORMAL HIGH (ref 65–99)
Glucose-Capillary: 127 mg/dL — ABNORMAL HIGH (ref 65–99)
Glucose-Capillary: 78 mg/dL (ref 65–99)
Glucose-Capillary: 81 mg/dL (ref 65–99)

## 2016-09-16 MED ORDER — FUROSEMIDE 40 MG PO TABS
40.0000 mg | ORAL_TABLET | Freq: Two times a day (BID) | ORAL | Status: AC
  Administered 2016-09-16 – 2016-09-17 (×3): 40 mg via ORAL
  Filled 2016-09-16 (×3): qty 1

## 2016-09-16 MED ORDER — WARFARIN SODIUM 2.5 MG PO TABS
2.5000 mg | ORAL_TABLET | Freq: Once | ORAL | Status: AC
  Administered 2016-09-16: 2.5 mg via ORAL
  Filled 2016-09-16: qty 1

## 2016-09-16 MED ORDER — PROMETHAZINE-CODEINE 6.25-10 MG/5ML PO SYRP
5.0000 mL | ORAL_SOLUTION | Freq: Four times a day (QID) | ORAL | Status: DC | PRN
  Administered 2016-09-16 – 2016-09-17 (×2): 5 mL via ORAL
  Filled 2016-09-16 (×2): qty 5

## 2016-09-16 NOTE — Progress Notes (Signed)
PROGRESS NOTE    Hannah [name]  QMG:[id number] DOB: 1945-09-22 DOA: 09/13/2016 PCP: No PCP Per Patient  Outpatient Specialists:     Brief Narrative:   89 ? Atrial fibrillation [place] score >3 Hypertension Heart failure Diabetes  Originally from [place]-traveled down recently noticing increasing swelling of lower extremities also had a fall CT chest negative for PE showing possible airspace disease Troponin slightly elevated 0.03 BUN/creatinine 25/0.9 without known baseline WBC 8.8, hemoglobin 8 9 platelet 217 INR 2.1 TSH 3.9  Patient non-compliant on lasix, has been off it for a couple of months, has PND,DOE and 3 pillow orthopnea  Assessment & Plan:   Principal Problem:   Community acquired pneumonia Active Problems:   CHF (congestive heart failure) (HCC)   Cellulitis of right lower extremity   1 pneumonia versus decompensated unknown type heart failure-treat both, lactic acid 1.9, trend WBC, continue IV Lasix 40 twice a day. Home dose 80 daily, stop IV fluids 50 cc per hour. CXR 3/4 not sensitive for CT findings seen previously--cont Abx todfay as WBC ? -monitor for Fever spike--D/c Levaquin 3/6 and observe off Abx--add Codeine and promethazine 2  atrial fibrillation, [place]i score >4-INR therapeutic. Continue amiodarone 200 daily, metoprolol 50 twice a day 3 Acute diast heart failure-Echo 01/2016=EF 60-65% and acute respiratory failure form this-BNP on admission 360 consider cutting back metoprolol 50 twice a day. Diuresis increased from 40 twice a day Lasix--60 twice a day.  Now -4.2 L.  baslein weight ~ 310 lbs per patient--weight inaccurate--given Iatrogenic AKI unfortunately have to back down t PO lasix 40 BID 4 hypothyroidism-continue 25 g Synthroid daily 5 diabetes mellitus type 2 reasonable to continue metformin. All CBG <100 6 depression continue BuSpar 7.5 twice a day, Cymbalta 90 daily, trazodone daily at bedtime, Ambien 10 mg daily at bedtime-needs to establish  care to clarify medications and the foot similar medications of similar type-continue Xanax 0.5 twice a day anxiety 7  allergies continue Xyzal 5 mg daily at bedtime Singulair 10 mg every afternoon. 8 ADD-on Strattera 80 mg-probably needs a psychiatrist to establish care -- her medications 9 Body mass index is 51.84 kg/m.-Life-threatening may need weight loss regimen 10 uncontrolled hypertension-monitor 11 Varicose veins-unlikelly infection-if PCT and lactic acid neg, d/c abx--Doppler not sensitive but grossly not abnormal--Unna Boots 11 marginally elevated troponin-if not having chest pain just monitor. Obesity can cause false positives 11 Unlikely cellutlitis-HOld all Abx 3/6   DVT prophylaxis: lovenox Code Status: Full Family Communication: d/w daughter on phone 09/16/16-->called 3/6 again Disposition Plan: cautious diuressis-suspect end of week can d/c home  Consultants:     Procedures:     Antimicrobials:   Levaquin    Subjective:  Cough with pleurisy No sputum Overall breathing better Sat OOB today No cp No fever Still some wheezing   Objective: Vitals:   09/15/16 2017 09/15/16 2043 09/16/16 0508 09/16/16 0731  BP: 107/65  111/69   Pulse: 87  78   Resp: 18  20   Temp: 98.3 F (36.8 C)  97.5 F (36.4 C)   TempSrc: Oral  Oral   SpO2: 100% 99% 99% 98%  Weight:   (!) 145.7 kg (321 lb 3.4 oz)   Height:        Intake/Output Summary (Last 24 hours) at 09/16/16 1008 Last data filed at 09/16/16 0509  Gross per 24 hour  Intake              150 ml  Output  4000 ml  Net            -3850 ml   Filed Weights   09/14/16 0432 09/15/16 1719 09/16/16 0508  Weight: (!) 147.6 kg (325 lb 6.4 oz) (!) 144.5 kg (318 lb 9.6 oz) (!) 145.7 kg (321 lb 3.4 oz)    Examination:  General exam: fair Respiratory system: Clear to auscultation. Respiratory effort fair  with wheeze Cardiovascular system: S1 & S2 heard, RRR. Cannot appreciate JVD,no  m/r/g Gastrointestinal system: Abdomen is obese, soft nt,nd poor exam Central nervous system: Alert and oriented. No focal neurological deficits. Extremities: Symmetric 5 x 5 power. Skin: 2-3 + edema R leg still, not warm Psychiatry: Judgement and insight appear normal. Mood & affect appropriate.     Data Reviewed: I have personally reviewed following labs and imaging studies  CBC:  Recent Labs Lab 09/13/16 1337 09/14/16 0151 09/15/16 0521 09/16/16 0854  WBC 9.4 8.8 11.5* 7.8  NEUTROABS 8.4*  --   --   --   HGB 9.6* 8.9* 9.2* 8.9*  HCT 31.7* 29.6* 30.9* 29.8*  MCV 87.6 87.1 88.8 88.2  PLT 231 217 257 408   Basic Metabolic Panel:  Recent Labs Lab 09/13/16 1337 09/13/16 2200 09/14/16 0151 09/15/16 0521 09/16/16 0854  NA 142  --  141 140 138  K 3.3*  --  4.2 4.3 4.2  CL 108  --  107 103 100*  CO2 26  --  27 29 32  GLUCOSE 117*  --  196* 97 152*  BUN 30*  --  25* 35* 41*  CREATININE 0.94  --  0.91 0.96 1.19*  CALCIUM 8.8*  --  8.7* 8.9 8.7*  MG  --  1.7  --   --   --    GFR: Estimated Creatinine Clearance: 66.1 mL/min (by C-G formula based on SCr of 1.19 mg/dL (H)). Liver Function Tests:  Recent Labs Lab 09/13/16 1337 09/14/16 0151 09/15/16 0521 09/16/16 0854  AST 23 24 17 17   ALT 29 27 24 21   ALKPHOS 62 61 58 52  BILITOT 2.4* 2.0* 1.3* 1.2  PROT 7.9 7.5 7.5 6.8  ALBUMIN 3.4* 3.1* 3.1* 2.9*   No results for input(s): LIPASE, AMYLASE in the last 168 hours. No results for input(s): AMMONIA in the last 168 hours. Coagulation Profile:  Recent Labs Lab 09/13/16 2144 09/14/16 0151 09/15/16 0521 09/16/16 0611  INR 2.11 2.05 2.32 2.52   Cardiac Enzymes:  Recent Labs Lab 09/13/16 1337 09/13/16 2144 09/14/16 0151 09/14/16 0805  TROPONINI 0.03* <0.03 0.03* <0.03   BNP (last 3 results) No results for input(s): PROBNP in the last 8760 hours. HbA1C:  Recent Labs  09/13/16 2144  HGBA1C 6.0*   CBG:  Recent Labs Lab 09/15/16 0722  09/15/16 1149 09/15/16 1712 09/15/16 2015 09/16/16 0723  GLUCAP 83 100* 103* 93 119*   Lipid Profile: No results for input(s): CHOL, HDL, LDLCALC, TRIG, CHOLHDL, LDLDIRECT in the last 72 hours. Thyroid Function Tests:  Recent Labs  09/13/16 2144  TSH 3.953   Anemia Panel: No results for input(s): VITAMINB12, FOLATE, FERRITIN, TIBC, IRON, RETICCTPCT in the last 72 hours. Urine analysis: No results found for: COLORURINE, APPEARANCEUR, LABSPEC, PHURINE, GLUCOSEU, HGBUR, BILIRUBINUR, KETONESUR, PROTEINUR, UROBILINOGEN, NITRITE, LEUKOCYTESUR Sepsis Labs: @LABRCNTIP (procalcitonin:4,lacticidven:4)  ) Recent Results (from the past 240 hour(s))  Culture, blood (routine x 2) Call MD if unable to obtain prior to antibiotics being given     Status: None (Preliminary result)   Collection Time: 09/13/16  9:44 PM  Result Value Ref Range Status   Specimen Description BLOOD LEFT HAND  Final   Special Requests IN PEDIATRIC BOTTLE 2CC  Final   Culture   Final    NO GROWTH 1 DAY Performed at [facility], [place] [address] [place], [place] [address]    Report Status PENDING  Incomplete  Culture, blood (routine x 2) Call MD if unable to obtain prior to antibiotics being given     Status: None (Preliminary result)   Collection Time: 09/13/16  9:44 PM  Result Value Ref Range Status   Specimen Description BLOOD LEFT [facility]  Final   Special Requests IN PEDIATRIC BOTTLE 2CC  Final   Culture   Final    NO GROWTH 1 DAY Performed at [facility], [place] [address], [place], [place] [address]    Report Status PENDING  Incomplete         Radiology Studies: Dg Chest Port 1 View  Result Date: 09/14/2016 CLINICAL DATA:  Productive cough for 2 weeks EXAM: PORTABLE CHEST 1 VIEW COMPARISON:  Yesterday FINDINGS: Cardiomegaly and probable pulmonary hypertension. Implanted loop recorder noted. Patchy airspace disease seen by yesterday's CT is not clearly visible radiographically. There is no edema,  consolidation, effusion, or pneumothorax. IMPRESSION: 1. Stable compared to yesterday. Patient's patchy airspace disease by CT is not visible radiographically. 2. Cardiomegaly and probable pulmonary hypertension. Electronically Signed   By: Monte Fantasia M.D.   On: 09/14/2016 14:31        Scheduled Meds: . amiodarone  200 mg Oral Daily  . atomoxetine  80 mg Oral QAC breakfast  . DULoxetine  90 mg Oral Daily  . furosemide  60 mg Intravenous BID  . insulin aspart  0-15 Units Subcutaneous TID WC  . insulin aspart  0-5 Units Subcutaneous QHS  . levalbuterol  0.63 mg Nebulization TID  . levofloxacin (LEVAQUIN) IV  750 mg Intravenous Q24H  . levothyroxine  25 mcg Oral QAC breakfast  . loratadine  10 mg Oral QPM  . mouth rinse  15 mL Mouth Rinse BID  . metFORMIN  500 mg Oral Q breakfast  . metoprolol  50 mg Oral BID  . montelukast  10 mg Oral QHS  . pantoprazole  40 mg Oral Daily  . sodium chloride flush  3 mL Intravenous Q12H  . traZODone  50-100 mg Oral QHS  . warfarin  2.5 mg Oral ONCE-1800  . Warfarin - Pharmacist Dosing Inpatient   Does not apply q1800   Continuous Infusions:    LOS: 3 days    Time spent: [place], MD [facility] ([facility]   If 7PM-7AM, please contact night-coverage [email or web address] Password [email or web address] 09/16/2016, 10:08 AM

## 2016-09-16 NOTE — Progress Notes (Signed)
[facility] for warfarin Indication: atrial fibrillation  Allergies  Allergen Reactions  . Penicillins     Possibility; father and brother allergic     Patient Measurements: Height: 5\' 6"  (167.6 cm) Weight: (!) 321 lb 3.4 oz (145.7 kg) IBW/kg (Calculated) : 59.3  Vital Signs: Temp: 97.5 F (36.4 C) (03/06 0508) Temp Source: Oral (03/06 0508) BP: 111/69 (03/06 0508) Pulse Rate: 78 (03/06 0508)  Labs:  Recent Labs  09/13/16 1337  09/13/16 2144 09/14/16 0151 09/14/16 0805 09/15/16 0521 09/16/16 0611  HGB 9.6*  --   --  8.9*  --  9.2*  --   HCT 31.7*  --   --  29.6*  --  30.9*  --   PLT 231  --   --  217  --  257  --   APTT  --   --  32  --   --   --   --   LABPROT  --   < > 24.0* 23.5*  --  25.9* 27.7*  INR  --   < > 2.11 2.05  --  2.32 2.52  CREATININE 0.94  --   --  0.91  --  0.96  --   TROPONINI 0.03*  --  <0.03 0.03* <0.03  --   --   < > = values in this interval not displayed.  Estimated Creatinine Clearance: 82 mL/min (by C-G formula based on SCr of 0.96 mg/dL).   Medical History: Past Medical History:  Diagnosis Date  . Atrial fibrillation ([place])   . Diabetes mellitus without complication ([place])   . Glaucoma    BILATERAL  . Hypertension   . Thyroid disease     Assessment: 58 yoF with PMH Afib on warfarin and amiodarone, DM, CHF, thyroid disease, HTN, presenting 3/3 with cough, SOB, and swelling/redness of LE. Admitting for CAP, cellulitis, and mild AECHF. Pharmacy to dose warfarin while admitted.   Baseline INR therapeutic  Prior anticoagulation: warfarin 5 mg daily, last dose 3/2  Significant events: 3/4 - given warfarin early in AM to make up for missed dose 3/3  Today, 09/16/2016:  INR therapeutic and trending up with lower warfarin dose  CBC: 3/5 - Hgb low but stable; Plt wnl  Major drug interactions: on amio at home, now on Levaquin  No bleeding issues reported  Full diet ordered  Goal of  Therapy: INR 2-3  Plan:  Warfarin 2.5mg  PO x 1 for INR rise  Daily INR  CBC at least q72 hr while on warfarin  Monitor for signs of bleeding or thrombosis  Doreene Eland, PharmD, BCPS.   Pager: [phone] 09/16/2016 8:08 AM

## 2016-09-17 DIAGNOSIS — I482 Chronic atrial fibrillation: Secondary | ICD-10-CM

## 2016-09-17 DIAGNOSIS — I5033 Acute on chronic diastolic (congestive) heart failure: Secondary | ICD-10-CM

## 2016-09-17 DIAGNOSIS — J181 Lobar pneumonia, unspecified organism: Secondary | ICD-10-CM

## 2016-09-17 DIAGNOSIS — J9600 Acute respiratory failure, unspecified whether with hypoxia or hypercapnia: Secondary | ICD-10-CM

## 2016-09-17 LAB — BASIC METABOLIC PANEL
Anion gap: 6 (ref 5–15)
BUN: 45 mg/dL — AB (ref 6–20)
CALCIUM: 8.4 mg/dL — AB (ref 8.9–10.3)
CHLORIDE: 99 mmol/L — AB (ref 101–111)
CO2: 32 mmol/L (ref 22–32)
Creatinine, Ser: 1.25 mg/dL — ABNORMAL HIGH (ref 0.44–1.00)
GFR, EST AFRICAN AMERICAN: 50 mL/min — AB (ref >60)
GFR, EST NON AFRICAN AMERICAN: 43 mL/min — AB (ref >60)
Glucose, Bld: 120 mg/dL — ABNORMAL HIGH (ref 65–99)
Potassium: 4.1 mmol/L (ref 3.5–5.1)
SODIUM: 137 mmol/L (ref 135–145)

## 2016-09-17 LAB — PROTIME-INR
INR: 2.76
PROTHROMBIN TIME: 29.8 s — AB (ref 11.4–15.2)

## 2016-09-17 LAB — GLUCOSE, CAPILLARY
GLUCOSE-CAPILLARY: 101 mg/dL — AB (ref 65–99)
GLUCOSE-CAPILLARY: 112 mg/dL — AB (ref 65–99)
GLUCOSE-CAPILLARY: 147 mg/dL — AB (ref 65–99)
Glucose-Capillary: 119 mg/dL — ABNORMAL HIGH (ref 65–99)

## 2016-09-17 MED ORDER — HYDROCODONE-HOMATROPINE 5-1.5 MG/5ML PO SYRP
5.0000 mL | ORAL_SOLUTION | ORAL | Status: DC | PRN
  Administered 2016-09-17 – 2016-09-18 (×2): 5 mL via ORAL
  Filled 2016-09-17 (×2): qty 5

## 2016-09-17 MED ORDER — WARFARIN SODIUM 2.5 MG PO TABS
2.5000 mg | ORAL_TABLET | Freq: Once | ORAL | Status: AC
  Administered 2016-09-17: 2.5 mg via ORAL
  Filled 2016-09-17: qty 1

## 2016-09-17 MED ORDER — FUROSEMIDE 20 MG PO TABS
20.0000 mg | ORAL_TABLET | Freq: Two times a day (BID) | ORAL | Status: DC
  Administered 2016-09-18 (×2): 20 mg via ORAL
  Filled 2016-09-17 (×2): qty 1

## 2016-09-17 MED ORDER — PREDNISONE 20 MG PO TABS
40.0000 mg | ORAL_TABLET | Freq: Every day | ORAL | Status: DC
  Administered 2016-09-17 – 2016-09-18 (×2): 40 mg via ORAL
  Filled 2016-09-17 (×2): qty 2

## 2016-09-17 NOTE — Evaluation (Addendum)
Physical Therapy Evaluation Patient Details Name: Hannah [name] MRN: [id number] DOB: 1945/10/16 Today's Date: 09/17/2016   History of Present Illness  71 yo female admitted with Pna, back pain, fall at home. Hx of bil LE edema, A fib, DM, HTN, obesity.   Clinical Impression  On eval, pt was Min guard assist for mobility. She walked ~150 feet with a RW. O2 sats 94% on RA at rest, 89-90% on RA during ambulation, dyspnea 3/4. Discussed d/c plan-pt stated daughter is home with her and can provide some assistance. Recommend HHPT, [place], and home health aide, if possible. Also recommend RW for ambulation safety.     Follow Up Recommendations Home health PT;Home Health OT; Home Health Aide;Supervision - Intermittent    Equipment Recommendations  Rolling walker with 5" wheels    Recommendations for Other Services OT consult     Precautions / Restrictions Precautions Precautions: Fall Precaution Comments: monitor O2 sats Restrictions Weight Bearing Restrictions: No      Mobility  Bed Mobility               General bed mobility comments: oob in recliner  Transfers Overall transfer level: Needs assistance Equipment used: Rolling walker (2 wheeled) Transfers: Sit to/from Stand Sit to Stand: Min guard         General transfer comment: close guard for safety.   Ambulation/Gait Ambulation/Gait assistance: Min guard Ambulation Distance (Feet): 150 Feet Assistive device: Rolling walker (2 wheeled) Gait Pattern/deviations: Step-through pattern;Decreased stride length     General Gait Details: close guard for safety. Pt walked ~15'x2 (to and from bathroom) without a device. Then, 150 feet in hallway with a RW. O2 sats 90% on RA. Dyspnea 3/4  Stairs            Wheelchair Mobility    Modified Rankin (Stroke Patients Only)       Balance Overall balance assessment: Needs assistance;History of Falls           Standing balance-Leahy Scale: Fair                                Pertinent Vitals/Pain Pain Assessment: Faces Faces Pain Scale: Hurts even more Pain Location: back Pain Intervention(s): Monitored during session    Home Living Family/patient expects to be discharged to:: Private residence Living Arrangements: Children Available Help at Discharge: Family Type of Home: House Home Access: Stairs to enter   Technical brewer of Steps: 2 Home Layout: One level Home Equipment: Cane - single point      Prior Function Level of Independence: Independent with assistive device(s)         Comments: uses cane PRN     Hand Dominance        Extremity/Trunk Assessment   Upper Extremity Assessment Upper Extremity Assessment: Overall WFL for tasks assessed    Lower Extremity Assessment Lower Extremity Assessment: Generalized weakness    Cervical / Trunk Assessment Cervical / Trunk Assessment: Normal  Communication   Communication: No difficulties  Cognition Arousal/Alertness: Awake/alert Behavior During Therapy: WFL for tasks assessed/performed Overall Cognitive Status: Within Functional Limits for tasks assessed                      General Comments      Exercises     Assessment/Plan    PT Assessment Patient needs continued PT services  PT Problem List Decreased strength;Decreased mobility;Decreased activity tolerance;Decreased balance;Decreased knowledge of use  of DME;Pain;Obesity       PT Treatment Interventions DME instruction;Gait training;Therapeutic activities;Therapeutic exercise;Patient/family education;Functional mobility training    PT Goals (Current goals can be found in the Care Plan section)  Acute Rehab PT Goals Patient Stated Goal: to regain independence PT Goal Formulation: With patient Time For Goal Achievement: 10/01/16 Potential to Achieve Goals: Good    Frequency Min 3X/week   Barriers to discharge        Co-evaluation               End of Session    Activity Tolerance: Patient tolerated treatment well Patient left: in chair;with call bell/phone within reach   PT Visit Diagnosis: Muscle weakness (generalized) (M62.81);Difficulty in walking, not elsewhere classified (R26.2)         Time: 1434-1510 PT Time Calculation (min) (ACUTE ONLY): 36 min   Charges:   PT Evaluation $PT Eval Low Complexity: 1 Procedure PT Treatments $Gait Training: 8-22 mins   PT G Codes:         Weston Anna, MPT Pager: [phone]

## 2016-09-17 NOTE — Care Management Note (Signed)
Case Management Note  Patient Details  Name: Hannah [name] MRN: [id number] Date of Birth: 29-Aug-1945  Subjective/Objective:    71 y.o. F admitted with CHF. Transitioned to po Lasix and reassessment of Creatinine in am.  Anticipate discharge home Thursday per MD rounds.   Action/Plan:No further CM needs but will be available should additional discharge needs arise.                 Expected Discharge Date:                  Expected Discharge Plan:  Home/Self Care  In-House Referral:  NA  Discharge planning Services  CM Consult  Post Acute Care Choice:  NA Choice offered to:  Patient  DME Arranged:    DME Agency:     HH Arranged:    [place] Agency:     Status of Service:  In process, will continue to follow  If discussed at Long Length of Stay Meetings, dates discussed:    Additional Comments:  Delrae Sawyers, RN 09/17/2016, 9:37 AM

## 2016-09-17 NOTE — Progress Notes (Signed)
PROGRESS NOTE [facility]   Hannah [name]   JHE:[id number] DOB: 1945-08-08  DOA: 09/13/2016 PCP: No PCP Per Patient   Brief Narrative:  71 year old female with history of A. fib, diabetes, CHF presented to the ED complaining of productive cough with yellow sputum shortness of breath, palpitations dizziness and fever. Patient was admitted for decompensated CHF versus pneumonia treated with antibiotics and Lasix.  Subjective: Patient seen and examined. Patient continues to c/o cough and r side chest pain.   Assessment & Plan: Acute respiratory failure - multifactorial in view of possible pneumonia and decompensated diastolic failure. Improving  Treated with Levaquin for 4 days  Will add hycodan and prednisone (diffuse wheezing)  Decrease Lasix as Cr is slight up today  Continue nebulizer  O2 supplement wean as tolerated  Check O2 sat with ambulation   Acute on chronic diastolic CHF - EF 18-56%  Continue Lasix as above On metoprolol  Patient will need to establish care with cardiology - she recently move to [place]   Chronic Afib - CHADVASCS 4  On Warfarin and Amio - HR and rhythm well controlled   Venous insuffiencey She has unna boot placed on 09/14/16  Needs to establish care with PCP, Louretta Parma boot should be change in 1 week   Morbid obesity - BMI 51.8 Outpatient follow up   Anxiety/Depression  Continue home medications   DVT prophylaxis: Warfarin  Code Status: FULL  Family Communication: Case discussed with daughter via phone  Disposition Plan: Home when medically stable   Consultants:   None   Procedures:   ECHO 09/14/16 ------------------------------------------------------------------- Study Conclusions  - Left ventricle: The cavity size was normal. There was mild   concentric hypertrophy. Systolic function was normal. The   estimated ejection fraction was in the range of 50% to 55%.   Although no diagnostic regional wall motion abnormality was  identified, this possibility cannot be completely excluded on the   basis of this study. - Mitral valve: Mildly calcified annulus. There was mild   regurgitation. - Left atrium: The atrium was severely dilated. - Right atrium: The atrium was moderately to severely dilated. - Pulmonary arteries: Systolic pressure was moderately increased.   PA peak pressure: 47 mm Hg (S).  Antimicrobials:  Levaquin 3/3-3/6   Objective: Vitals:   09/17/16 0515 09/17/16 0844 09/17/16 1331 09/17/16 1336  BP: (!) 98/56  (!) 104/43   Pulse: 65  75   Resp: 18  (!) 22   Temp: 97.5 F (36.4 C)  97.6 F (36.4 C)   TempSrc: Oral  Oral   SpO2: 100% 94% 97% 96%  Weight: (!) 143.6 kg (316 lb 9.3 oz)     Height:        Intake/Output Summary (Last 24 hours) at 09/17/16 1542 Last data filed at 09/17/16 0750  Gross per 24 hour  Intake                0 ml  Output             2752 ml  Net            -2752 ml   Filed Weights   09/15/16 1719 09/16/16 0508 09/17/16 0515  Weight: (!) 144.5 kg (318 lb 9.6 oz) (!) 145.7 kg (321 lb 3.4 oz) (!) 143.6 kg (316 lb 9.3 oz)    Examination:  General exam: NAD  Respiratory system: Good air entry, R crackles at the R side, mild expiratory wheezing at the left side  Cardiovascular  system: S1 & S2 heard, RRR. No JVD, murmurs, rubs or gallops Gastrointestinal system: Abdomen is nondistended, soft and nontender.  Central nervous system: Alert and oriented.  Extremities: LE extremity edema. Unna boot in place b/l  Skin: No rashes, lesions or ulcers Psychiatry: Mood & affect appropriate.    Data Reviewed: I have personally reviewed following labs and imaging studies  CBC:  Recent Labs Lab 09/13/16 1337 09/14/16 0151 09/15/16 0521 09/16/16 0854  WBC 9.4 8.8 11.5* 7.8  NEUTROABS 8.4*  --   --   --   HGB 9.6* 8.9* 9.2* 8.9*  HCT 31.7* 29.6* 30.9* 29.8*  MCV 87.6 87.1 88.8 88.2  PLT 231 217 257 287   Basic Metabolic Panel:  Recent Labs Lab 09/13/16 1337  09/13/16 2200 09/14/16 0151 09/15/16 0521 09/16/16 0854 09/17/16 0535  NA 142  --  141 140 138 137  K 3.3*  --  4.2 4.3 4.2 4.1  CL 108  --  107 103 100* 99*  CO2 26  --  27 29 32 32  GLUCOSE 117*  --  196* 97 152* 120*  BUN 30*  --  25* 35* 41* 45*  CREATININE 0.94  --  0.91 0.96 1.19* 1.25*  CALCIUM 8.8*  --  8.7* 8.9 8.7* 8.4*  MG  --  1.7  --   --   --   --    GFR: Estimated Creatinine Clearance: 62.4 mL/min (by C-G formula based on SCr of 1.25 mg/dL (H)). Liver Function Tests:  Recent Labs Lab 09/13/16 1337 09/14/16 0151 09/15/16 0521 09/16/16 0854  AST 23 24 17 17   ALT 29 27 24 21   ALKPHOS 62 61 58 52  BILITOT 2.4* 2.0* 1.3* 1.2  PROT 7.9 7.5 7.5 6.8  ALBUMIN 3.4* 3.1* 3.1* 2.9*   No results for input(s): LIPASE, AMYLASE in the last 168 hours. No results for input(s): AMMONIA in the last 168 hours. Coagulation Profile:  Recent Labs Lab 09/13/16 2144 09/14/16 0151 09/15/16 0521 09/16/16 0611 09/17/16 0535  INR 2.11 2.05 2.32 2.52 2.76   Cardiac Enzymes:  Recent Labs Lab 09/13/16 1337 09/13/16 2144 09/14/16 0151 09/14/16 0805  TROPONINI 0.03* <0.03 0.03* <0.03   BNP (last 3 results) No results for input(s): PROBNP in the last 8760 hours. HbA1C: No results for input(s): HGBA1C in the last 72 hours. CBG:  Recent Labs Lab 09/16/16 1223 09/16/16 1718 09/16/16 2039 09/17/16 0733 09/17/16 1157  GLUCAP 127* 78 81 101* 112*   Lipid Profile: No results for input(s): CHOL, HDL, LDLCALC, TRIG, CHOLHDL, LDLDIRECT in the last 72 hours. Thyroid Function Tests: No results for input(s): TSH, T4TOTAL, FREET4, T3FREE, THYROIDAB in the last 72 hours. Anemia Panel: No results for input(s): VITAMINB12, FOLATE, FERRITIN, TIBC, IRON, RETICCTPCT in the last 72 hours. Sepsis Labs:  Recent Labs Lab 09/14/16 1248 09/14/16 1548 09/16/16 0611  PROCALCITON <0.10  --  <0.10  LATICACIDVEN 1.8 1.9  --     Recent Results (from the past 240 hour(s))  Culture,  blood (routine x 2) Call MD if unable to obtain prior to antibiotics being given     Status: None (Preliminary result)   Collection Time: 09/13/16  9:44 PM  Result Value Ref Range Status   Specimen Description BLOOD LEFT HAND  Final   Special Requests IN PEDIATRIC BOTTLE 2CC  Final   Culture   Final    NO GROWTH 3 DAYS Performed at [facility] [address], [place], [place] [address]  Report Status PENDING  Incomplete  Culture, blood (routine x 2) Call MD if unable to obtain prior to antibiotics being given     Status: None (Preliminary result)   Collection Time: 09/13/16  9:44 PM  Result Value Ref Range Status   Specimen Description BLOOD LEFT [facility]  Final   Special Requests IN PEDIATRIC BOTTLE 2CC  Final   Culture   Final    NO GROWTH 3 DAYS Performed at [facility] [address], [place], [place] [address]    Report Status PENDING  Incomplete      Radiology Studies: No results found.    Scheduled Meds: . amiodarone  200 mg Oral Daily  . atomoxetine  80 mg Oral QAC breakfast  . DULoxetine  90 mg Oral Daily  . [START ON 09/18/2016] furosemide  20 mg Oral BID  . furosemide  40 mg Oral BID  . insulin aspart  0-15 Units Subcutaneous TID WC  . insulin aspart  0-5 Units Subcutaneous QHS  . levalbuterol  0.63 mg Nebulization TID  . levothyroxine  25 mcg Oral QAC breakfast  . loratadine  10 mg Oral QPM  . mouth rinse  15 mL Mouth Rinse BID  . metFORMIN  500 mg Oral Q breakfast  . metoprolol  50 mg Oral BID  . montelukast  10 mg Oral QHS  . pantoprazole  40 mg Oral Daily  . predniSONE  40 mg Oral Q breakfast  . sodium chloride flush  3 mL Intravenous Q12H  . traZODone  50-100 mg Oral QHS  . warfarin  2.5 mg Oral ONCE-1800  . Warfarin - Pharmacist Dosing Inpatient   Does not apply q1800   Continuous Infusions:   LOS: 4 days    Chipper Oman, MD Pager: Text Page via [email or web address]  [phone]  If 7PM-7AM, please contact  night-coverage [email or web address] Password [facility] 09/17/2016, 3:42 PM

## 2016-09-17 NOTE — Progress Notes (Signed)
[place] for warfarin Indication: atrial fibrillation  Allergies  Allergen Reactions  . Penicillins     Possibility; father and brother allergic     Patient Measurements: Height: 5\' 6"  (167.6 cm) Weight: (!) 316 lb 9.3 oz (143.6 kg) IBW/kg (Calculated) : 59.3  Vital Signs: Temp: 97.5 F (36.4 C) (03/07 0515) Temp Source: Oral (03/07 0515) BP: 98/56 (03/07 0515) Pulse Rate: 65 (03/07 0515)  Labs:  Recent Labs  09/14/16 0805 09/15/16 0521 09/16/16 2919 09/16/16 0854 09/17/16 0535  HGB  --  9.2*  --  8.9*  --   HCT  --  30.9*  --  29.8*  --   PLT  --  257  --  230  --   LABPROT  --  25.9* 27.7*  --  29.8*  INR  --  2.32 2.52  --  2.76  CREATININE  --  0.96  --  1.19* 1.25*  TROPONINI <0.03  --   --   --   --     Estimated Creatinine Clearance: 62.4 mL/min (by C-G formula based on SCr of 1.25 mg/dL (H)).   Medical History: Past Medical History:  Diagnosis Date  . Atrial fibrillation ([place])   . Diabetes mellitus without complication ([place])   . Glaucoma    BILATERAL  . Hypertension   . Thyroid disease     Assessment: 28 yoF with PMH Afib on warfarin and amiodarone, DM, CHF, thyroid disease, HTN, presenting 3/3 with cough, SOB, and swelling/redness of LE. Admitting for CAP, cellulitis, and mild AECHF. Pharmacy to dose warfarin while admitted.   Baseline INR therapeutic  Prior anticoagulation: warfarin 5 mg daily, last dose 3/2  Significant events: 3/4 - given warfarin early in AM to make up for missed dose 3/3  Today, 09/17/2016:  INR therapeutic and trending up with lower warfarin dose  CBC: 3/6 - Hgb low but stable; Pltc WNL  Major drug interactions: on amiodarone at home; inpatient levofloxacin d/c'ed 3/6  No bleeding issues reported  Heart healthy/carb modified diet ordered  Goal of Therapy: INR 2-3  Plan:  Continue lower dose of warfarin 2.5mg  PO x 1 for INR rise  Daily PT/INR  CBC at least q72 hr  while on warfarin  Monitor for s/sx of bleeding   Lindell Spar, PharmD, BCPS Pager: [phone] 09/17/2016 8:08 AM

## 2016-09-18 DIAGNOSIS — I872 Venous insufficiency (chronic) (peripheral): Secondary | ICD-10-CM

## 2016-09-18 LAB — CBC
HCT: 29.4 % — ABNORMAL LOW (ref 36.0–46.0)
Hemoglobin: 9 g/dL — ABNORMAL LOW (ref 12.0–15.0)
MCH: 26.5 pg (ref 26.0–34.0)
MCHC: 30.6 g/dL (ref 30.0–36.0)
MCV: 86.5 fL (ref 78.0–100.0)
Platelets: 245 10*3/uL (ref 150–400)
RBC: 3.4 MIL/uL — ABNORMAL LOW (ref 3.87–5.11)
RDW: 16.2 % — ABNORMAL HIGH (ref 11.5–15.5)
WBC: 7.7 10*3/uL (ref 4.0–10.5)

## 2016-09-18 LAB — BASIC METABOLIC PANEL
ANION GAP: 6 (ref 5–15)
BUN: 41 mg/dL — ABNORMAL HIGH (ref 6–20)
CHLORIDE: 96 mmol/L — AB (ref 101–111)
CO2: 34 mmol/L — AB (ref 22–32)
Calcium: 8.9 mg/dL (ref 8.9–10.3)
Creatinine, Ser: 1.08 mg/dL — ABNORMAL HIGH (ref 0.44–1.00)
GFR calc non Af Amer: 51 mL/min — ABNORMAL LOW (ref >60)
GFR, EST AFRICAN AMERICAN: 59 mL/min — AB (ref >60)
GLUCOSE: 160 mg/dL — AB (ref 65–99)
Potassium: 5.1 mmol/L (ref 3.5–5.1)
Sodium: 136 mmol/L (ref 135–145)

## 2016-09-18 LAB — GLUCOSE, CAPILLARY
Glucose-Capillary: 124 mg/dL — ABNORMAL HIGH (ref 65–99)
Glucose-Capillary: 106 mg/dL — ABNORMAL HIGH (ref 65–99)
Glucose-Capillary: 117 mg/dL — ABNORMAL HIGH (ref 65–99)

## 2016-09-18 LAB — PROTIME-INR
INR: 2.45
PROTHROMBIN TIME: 27.1 s — AB (ref 11.4–15.2)

## 2016-09-18 LAB — PROCALCITONIN: Procalcitonin: <0.1 ng/mL

## 2016-09-18 MED ORDER — PREDNISONE 20 MG PO TABS: 40.0000 mg | ORAL_TABLET | Freq: Every day | ORAL | 0 refills | Status: AC

## 2016-09-18 MED ORDER — FUROSEMIDE 20 MG PO TABS: 20.0000 mg | ORAL_TABLET | Freq: Two times a day (BID) | ORAL | 0 refills | Status: DC

## 2016-09-18 MED ORDER — HYDROCODONE-HOMATROPINE 5-1.5 MG/5ML PO SYRP: 5.0000 mL | ORAL_SOLUTION | ORAL | 0 refills | Status: DC | PRN

## 2016-09-18 MED ORDER — ALBUTEROL SULFATE HFA 108 (90 BASE) MCG/ACT IN AERS: 2.0000 | INHALATION_SPRAY | Freq: Four times a day (QID) | RESPIRATORY_TRACT | 2 refills | Status: DC | PRN

## 2016-09-18 MED ORDER — WARFARIN SODIUM 4 MG PO TABS
4.0000 mg | ORAL_TABLET | Freq: Once | ORAL | Status: AC
  Administered 2016-09-18: 4 mg via ORAL
  Filled 2016-09-18: qty 1

## 2016-09-18 NOTE — Care Management (Signed)
CM received call from RN at [facility] concerning [facility] services being added Orders for [facility] services faxed to [facility]. No further CM needs identified.

## 2016-09-18 NOTE — Discharge Summary (Addendum)
Physician Discharge Summary  Hannah [name]  KVQ:[id number]  DOB: 20-Apr-1946  DOA: 09/13/2016 PCP: No PCP Per Patient  Admit date: 09/13/2016 Discharge date: 09/18/2016  Admitted From: Home  Disposition:  Home  Recommendations for Outpatient Follow-up:  1. Follow up with PCP in 1-2 weeks 2. Please obtain BMP/CBC in one week 3. Please follow up on the following pending results: Final blood cultures  4. Get INR in 1 week  5. RN to change Unna boot in 4 days   Home Health: HHPT and RN  Equipment/Devices: Environmental consultant   Discharge Condition: Improved  CODE STATUS: FULL  Diet recommendation: Heart Healthy   Brief/Interim Summary: 71 year old female with history of A. fib, diabetes, CHF presented to the ED complaining of productive cough with yellow sputum shortness of breath, palpitations dizziness and fever. Patient was admitted for decompensated CHF versus pneumonia treated with antibiotics and Lasix.  Subjective: Patient seen and examined, doing much better, still c/o cough. Breathing has improve significantly.   Discharge Diagnoses/Hospital Course:  Acute respiratory failure - multifactorial in view of possible pneumonia and decompensated diastolic failure. Improved breathing well on room air sat above 92 % Treated with Levaquin for 4 days - WBC, afebrile and clinically improving.  Cough treated with hycodan, which is helping continue q4 PRN  Prednisone for 5 day  Treated with aggressive diuresis initially then creatinine increased. Patient discharged on Lasix 20 mg BID. Advised to take an extra pill if increase on weight. Cardiology will arrange appointment to follow up Continue albuterol as needed  O2 sat with ambulation > 91%   Acute on chronic diastolic CHF - EF 64-33%  Continue Lasix as above  On metoprolol  Spoke with Cardiology team they will contact her for follow up appointment   Chronic Afib - CHADVASCS 4  On Warfarin and Amio - HR and rhythm well controlled  Check  INR in 1 week   Venous insuffiencey She has unna boot placed on 09/14/16  Needs to establish care with PCP, Louretta Parma boot should be change in 1 week - Home RN arranged to follow up   Morbid obesity - BMI 51.8 Outpatient follow up   Anxiety/Depression  Continue home medications   All other chronic medical condition were stable during the hospitalization.  Patient was seen by physical therapy, recommending HH PT and RN On the day of the discharge the patient's vitals were stable, and no other acute medical condition were reported by patient. the patient was felt safe to be discharge to   Discharge Instructions  You were cared for by a hospitalist during your hospital stay. If you have any questions about your discharge medications or the care you received while you were in the hospital after you are discharged, you can call the unit and asked to speak with the hospitalist on call if the hospitalist that took care of you is not available. Once you are discharged, your primary care physician will handle any further medical issues. Please note that NO REFILLS for any discharge medications will be authorized once you are discharged, as it is imperative that you return to your primary care physician (or establish a relationship with a primary care physician if you do not have one) for your aftercare needs so that they can reassess your need for medications and monitor your lab values.  Discharge Instructions    Call MD for:  difficulty breathing, headache or visual disturbances    Complete by:  As directed    Call MD for:  extreme fatigue    Complete by:  As directed    Call MD for:  hives    Complete by:  As directed    Call MD for:  persistant dizziness or light-headedness    Complete by:  As directed    Call MD for:  persistant nausea and vomiting    Complete by:  As directed    Call MD for:  redness, tenderness, or signs of infection (pain, swelling, redness, odor or green/yellow discharge  around incision site)    Complete by:  As directed    Call MD for:  severe uncontrolled pain    Complete by:  As directed    Call MD for:  temperature >100.4    Complete by:  As directed    Diet - low sodium heart healthy    Complete by:  As directed    Increase activity slowly    Complete by:  As directed      Allergies as of 09/18/2016      Reactions   Penicillins    Possibility; father and brother allergic       Medication List    TAKE these medications   albuterol 108 (90 Base) MCG/ACT inhaler Commonly known as:  PROVENTIL HFA;VENTOLIN HFA Inhale 2 puffs into the lungs every 6 (six) hours as needed for wheezing or shortness of breath.   ALPRAZolam 0.5 MG tablet Commonly known as:  XANAX Take 0.5 mg by mouth 2 (two) times daily as needed for anxiety.   amiodarone 200 MG tablet Commonly known as:  PACERONE Take 200 mg by mouth daily.   atomoxetine 80 MG capsule Commonly known as:  STRATTERA Take 80 mg by mouth daily.   busPIRone 15 MG tablet Commonly known as:  BUSPAR Take 7.5 mg by mouth 2 (two) times daily.   DULoxetine 30 MG capsule Commonly known as:  CYMBALTA Take 90 mg by mouth daily.   esomeprazole 40 MG capsule Commonly known as:  NEXIUM Take 40 mg by mouth daily.   furosemide 20 MG tablet Commonly known as:  LASIX Take 1 tablet (20 mg total) by mouth 2 (two) times daily. What changed:  medication strength  how much to take  when to take this   HYDROcodone-homatropine 5-1.5 MG/5ML syrup Commonly known as:  HYCODAN Take 5 mLs by mouth every 4 (four) hours as needed for cough.   levocetirizine 5 MG tablet Commonly known as:  XYZAL Take 5 mg by mouth every evening.   levothyroxine 25 MCG tablet Commonly known as:  SYNTHROID, LEVOTHROID Take 25 mcg by mouth daily before breakfast.   metFORMIN 500 MG tablet Commonly known as:  GLUCOPHAGE Take 500 mg by mouth daily with breakfast.   metoprolol 50 MG tablet Commonly known as:   LOPRESSOR Take 50 mg by mouth 2 (two) times daily.   montelukast 10 MG tablet Commonly known as:  SINGULAIR Take 10 mg by mouth every evening.   Potassium Chloride ER 20 MEQ Tbcr Take 20 mEq by mouth daily.   predniSONE 20 MG tablet Commonly known as:  DELTASONE Take 2 tablets (40 mg total) by mouth daily with breakfast. Start taking on:  09/19/2016   traZODone 50 MG tablet Commonly known as:  DESYREL Take 50-100 mg by mouth at bedtime.   warfarin 5 MG tablet Commonly known as:  COUMADIN Take 5 mg by mouth daily.   zolpidem 10 MG tablet Commonly known as:  AMBIEN Take 10 mg by mouth at bedtime.  Durable Medical Equipment        Start     Ordered   09/18/16 1040  For home use only DME Walker rolling  Once    Comments:  4 wheeled Rollator  Question:  Patient needs a walker to treat with the following condition  Answer:  SOB (shortness of breath)   09/18/16 1040     Follow-up Information    [facility]. Go on 10/13/2016.   Why:  Arrive at 11:30 am with all medications, hospital discharge papers, insurance card and photo ID. Dr. Kathaleen Maser information: [address] [place], [place] [address]  [phone]         Allergies  Allergen Reactions  . Penicillins     Possibility; father and brother allergic     Consultations: None  Procedures/Studies: Dg Tibia/fibula Left  Result Date: 09/13/2016 CLINICAL DATA:  Lower extremity edema for 2 years. Chronic cellulitis. EXAM: LEFT TIBIA AND FIBULA - 2 VIEW COMPARISON:  None. FINDINGS: Cortical margins of the tibia and fibula are intact. No fracture, periosteal reaction, bony destructive change or abnormal density. There is osteoarthritis of the knee. Subcutaneous edema diffusely through the lower extremity versus habitus. No radiopaque foreign body or soft tissue air. Phleboliths are noted. IMPRESSION: Soft tissue edema without acute osseous abnormality.  Electronically Signed   By: Jeb Levering M.D.   On: 09/13/2016 19:55   Dg Tibia/fibula Right  Result Date: 09/13/2016 CLINICAL DATA:  Bilateral lower extremity edema for 2 years. Cellulitis. EXAM: RIGHT TIBIA AND FIBULA - 2 VIEW COMPARISON:  None. FINDINGS: Cortical margins of the tibia and fibula are intact. No fracture, periosteal reaction, bony destructive change or abnormal density. There is osteoarthritis of the knee joint. Diffuse soft tissue edema of the lower extremity versus habitus. Scattered phleboliths. No radiopaque foreign body or soft tissue air. IMPRESSION: Soft tissue edema without acute osseous abnormality. Electronically Signed   By: Jeb Levering M.D.   On: 09/13/2016 19:56   Ct Angio Chest Pe W Or Wo Contrast  Result Date: 09/13/2016 CLINICAL DATA:  Back pain since falling yesterday. Edema in lower legs. Shortness of breath. EXAM: CT ANGIOGRAPHY CHEST WITH CONTRAST TECHNIQUE: Multidetector CT imaging of the chest was performed using the standard protocol during bolus administration of intravenous contrast. Multiplanar CT image reconstructions and MIPs were obtained to evaluate the vascular anatomy. CONTRAST:  100 mL of Isovue 370 COMPARISON:  Chest x-ray from earlier today FINDINGS: Cardiovascular: Cardiomegaly is identified. There are coronary artery calcifications, particularly in the left coronary arteries. The thoracic aorta is normal in caliber with no dissection. No pulmonary emboli identified. Mediastinum/Nodes: Shotty nodes are seen in the mediastinum. A representative right paratracheal node on axial image 21 measures 13 x 12 mm in short axis. These nodes may simply be reactive given the findings in the lungs. Mildly enlarged node in the left hilum. No effusions. The esophagus and limited views of the thyroid are normal. Lungs/Pleura: Central airways are normal. No pneumothorax. Mild air trapping in the lungs. Minimal interlobular septal thickening in the apices may  represent minimal edema. There is a small dense nodule in the late apex measuring 5.1 mm on coronal image 71. Scattered pulmonary opacities in the lung bases, left greater than right. While there is a component of dependent atelectasis, the findings are consistent with bibasilar infiltrates. There are nodular components which are ground-glass in attenuation. Two such nodules are identified on axial image 36. The larger in the right  upper lobe measures 14 mm well the smaller in the superior segment of the right lower lobe measures 8 mm. A somewhat nodular region of opacity is seen posteriorly in the superior segment of the left lower lobe on axial image 51. No masses. Upper Abdomen: No acute abnormality. Musculoskeletal: No chest wall abnormality. No acute or significant osseous findings. Review of the MIP images confirms the above findings. IMPRESSION: 1. No pulmonary emboli. 2. Patchy infiltrates are seen in the lungs bilaterally, most prominent in the left lung base. A few ground-glass nodules, as described above, are probably part of the infiltrative process. I suspect an infectious or inflammatory process. Recommend short-term follow-up CT scan after treatment to ensure resolution. 3. Mild edema.  Mild air trapping. 4. Mild mediastinal and left hilar adenopathy, probably reactive. Recommend attention on follow-up. Electronically Signed   By: Dorise Bullion III M.D   On: 09/13/2016 18:38   Dg Chest Port 1 View  Result Date: 09/14/2016 CLINICAL DATA:  Productive cough for 2 weeks EXAM: PORTABLE CHEST 1 VIEW COMPARISON:  Yesterday FINDINGS: Cardiomegaly and probable pulmonary hypertension. Implanted loop recorder noted. Patchy airspace disease seen by yesterday's CT is not clearly visible radiographically. There is no edema, consolidation, effusion, or pneumothorax. IMPRESSION: 1. Stable compared to yesterday. Patient's patchy airspace disease by CT is not visible radiographically. 2. Cardiomegaly and probable  pulmonary hypertension. Electronically Signed   By: Monte Fantasia M.D.   On: 09/14/2016 14:31   Dg Chest Port 1 View  Result Date: 09/13/2016 CLINICAL DATA:  Shortness of breath, cough. EXAM: PORTABLE CHEST 1 VIEW COMPARISON:  None. FINDINGS: Mild cardiomegaly is noted. No pneumothorax or pleural effusion is noted. Both lungs are clear. The visualized skeletal structures are unremarkable. IMPRESSION: No acute cardiopulmonary abnormality seen. Electronically Signed   By: Marijo Conception, M.D.   On: 09/13/2016 13:47    ECHO 09/14/16 ------------------------------------------------------------------- Study Conclusions  - Left ventricle: The cavity size was normal. There was mild   concentric hypertrophy. Systolic function was normal. The   estimated ejection fraction was in the range of 50% to 55%.   Although no diagnostic regional wall motion abnormality was   identified, this possibility cannot be completely excluded on the   basis of this study. - Mitral valve: Mildly calcified annulus. There was mild   regurgitation. - Left atrium: The atrium was severely dilated. - Right atrium: The atrium was moderately to severely dilated. - Pulmonary arteries: Systolic pressure was moderately increased.   PA peak pressure: 47 mm Hg (S).   Discharge Exam: Vitals:   09/17/16 2011 09/18/16 0446  BP: 126/81 119/88  Pulse: 77 64  Resp: 19 20  Temp: 98.4 F (36.9 C) 97.8 F (36.6 C)   Vitals:   09/17/16 2019 09/18/16 0300 09/18/16 0446 09/18/16 0820  BP:   119/88   Pulse:   64   Resp:   20   Temp:   97.8 F (36.6 C)   TempSrc:   Oral   SpO2: 97% 99%  96%  Weight:   (!) 143.5 kg (316 lb 5.8 oz)   Height:        General: Pt is alert, awake, not in acute distress Cardiovascular: RRR, S1/S2 +, no rubs, no gallops Respiratory: CTA mild b/l wheezing, productive cough  Abdominal: Soft, NT, ND, bowel sounds + Extremities: b/l unna boots in place, LE edema, no cyanosis   The results of  significant diagnostics from this hospitalization (including imaging, microbiology, ancillary and laboratory)  are listed below for reference.     Microbiology: Recent Results (from the past 240 hour(s))  Culture, blood (routine x 2) Call MD if unable to obtain prior to antibiotics being given     Status: None (Preliminary result)   Collection Time: 09/13/16  9:44 PM  Result Value Ref Range Status   Specimen Description BLOOD LEFT HAND  Final   Special Requests IN PEDIATRIC BOTTLE 2CC  Final   Culture   Final    NO GROWTH 3 DAYS Performed at [facility], [place] [address], [place], [place] [address]    Report Status PENDING  Incomplete  Culture, blood (routine x 2) Call MD if unable to obtain prior to antibiotics being given     Status: None (Preliminary result)   Collection Time: 09/13/16  9:44 PM  Result Value Ref Range Status   Specimen Description BLOOD LEFT [facility]  Final   Special Requests IN PEDIATRIC BOTTLE 2CC  Final   Culture   Final    NO GROWTH 3 DAYS Performed at [facility], [place] [address] [place], [place] [address]    Report Status PENDING  Incomplete     Labs: BNP (last 3 results)  Recent Labs  09/13/16 1337  BNP 829.5*   Basic Metabolic Panel:  Recent Labs Lab 09/13/16 2200 09/14/16 0151 09/15/16 0521 09/16/16 0854 09/17/16 0535 09/18/16 0542  NA  --  141 140 138 137 136  K  --  4.2 4.3 4.2 4.1 5.1  CL  --  107 103 100* 99* 96*  CO2  --  27 29 32 32 34*  GLUCOSE  --  196* 97 152* 120* 160*  BUN  --  25* 35* 41* 45* 41*  CREATININE  --  0.91 0.96 1.19* 1.25* 1.08*  CALCIUM  --  8.7* 8.9 8.7* 8.4* 8.9  MG 1.7  --   --   --   --   --    Liver Function Tests:  Recent Labs Lab 09/13/16 1337 09/14/16 0151 09/15/16 0521 09/16/16 0854  AST 23 24 17 17   ALT 29 27 24 21   ALKPHOS 62 61 58 52  BILITOT 2.4* 2.0* 1.3* 1.2  PROT 7.9 7.5 7.5 6.8  ALBUMIN 3.4* 3.1* 3.1* 2.9*   No results for input(s): LIPASE, AMYLASE in the last 168  hours. No results for input(s): AMMONIA in the last 168 hours. CBC:  Recent Labs Lab 09/13/16 1337 09/14/16 0151 09/15/16 0521 09/16/16 0854 09/18/16 0542  WBC 9.4 8.8 11.5* 7.8 7.7  NEUTROABS 8.4*  --   --   --   --   HGB 9.6* 8.9* 9.2* 8.9* 9.0*  HCT 31.7* 29.6* 30.9* 29.8* 29.4*  MCV 87.6 87.1 88.8 88.2 86.5  PLT 231 217 257 230 245   Cardiac Enzymes:  Recent Labs Lab 09/13/16 1337 09/13/16 2144 09/14/16 0151 09/14/16 0805  TROPONINI 0.03* <0.03 0.03* <0.03   BNP: Invalid input(s): POCBNP CBG:  Recent Labs Lab 09/17/16 1157 09/17/16 1643 09/17/16 2049 09/18/16 0730 09/18/16 1149  GLUCAP 112* 119* 147* 124* 106*   D-Dimer No results for input(s): DDIMER in the last 72 hours. Hgb A1c No results for input(s): HGBA1C in the last 72 hours. Lipid Profile No results for input(s): CHOL, HDL, LDLCALC, TRIG, CHOLHDL, LDLDIRECT in the last 72 hours. Thyroid function studies No results for input(s): TSH, T4TOTAL, T3FREE, THYROIDAB in the last 72 hours.  Invalid input(s): FREET3 Anemia work up No results for input(s): VITAMINB12,  FOLATE, FERRITIN, TIBC, IRON, RETICCTPCT in the last 72 hours. Urinalysis No results found for: COLORURINE, APPEARANCEUR, [place], [place], [place], [place], [place], [place], PROTEINUR, UROBILINOGEN, NITRITE, LEUKOCYTESUR Sepsis Labs Invalid input(s): PROCALCITONIN,  WBC,  LACTICIDVEN Microbiology Recent Results (from the past 240 hour(s))  Culture, blood (routine x 2) Call MD if unable to obtain prior to antibiotics being given     Status: None (Preliminary result)   Collection Time: 09/13/16  9:44 PM  Result Value Ref Range Status   Specimen Description BLOOD LEFT HAND  Final   Special Requests IN PEDIATRIC BOTTLE 2CC  Final   Culture   Final    NO GROWTH 3 DAYS Performed at [facility], [place] [address] [place], [place] [address]    Report Status PENDING  Incomplete  Culture, blood (routine x 2) Call MD if unable to  obtain prior to antibiotics being given     Status: None (Preliminary result)   Collection Time: 09/13/16  9:44 PM  Result Value Ref Range Status   Specimen Description BLOOD LEFT [facility]  Final   Special Requests IN PEDIATRIC BOTTLE 2CC  Final   Culture   Final    NO GROWTH 3 DAYS Performed at [facility], [place] [address], [place], [place] [address]    Report Status PENDING  Incomplete     Time coordinating discharge: Over 30 minutes  SIGNED:  Chipper Oman, MD  [facility] 09/18/2016, 1:25 PM  Pager please text page via  [email or web address] Password [email or web address]

## 2016-09-18 NOTE — Progress Notes (Signed)
[facility] for warfarin Indication: atrial fibrillation  Allergies  Allergen Reactions  . Penicillins     Possibility; father and brother allergic     Patient Measurements: Height: 5\' 6"  (167.6 cm) Weight: (!) 316 lb 5.8 oz (143.5 kg) IBW/kg (Calculated) : 59.3  Vital Signs: Temp: 97.8 F (36.6 C) (03/08 0446) Temp Source: Oral (03/08 0446) BP: 119/88 (03/08 0446) Pulse Rate: 64 (03/08 0446)  Labs:  Recent Labs  09/16/16 0611 09/16/16 0854 09/17/16 0535 09/18/16 0542  HGB  --  8.9*  --  9.0*  HCT  --  29.8*  --  29.4*  PLT  --  230  --  245  LABPROT 27.7*  --  29.8* 27.1*  INR 2.52  --  2.76 2.45  CREATININE  --  1.19* 1.25* 1.08*    Estimated Creatinine Clearance: 72.2 mL/min (by C-G formula based on SCr of 1.08 mg/dL (H)).   Medical History: Past Medical History:  Diagnosis Date  . Atrial fibrillation ([place])   . Diabetes mellitus without complication ([place])   . Glaucoma    BILATERAL  . Hypertension   . Thyroid disease     Assessment: 67 yoF with PMH Afib on warfarin and amiodarone, DM, CHF, thyroid disease, HTN, presenting 3/3 with cough, SOB, and swelling/redness of LE. Admitting for CAP, cellulitis, and mild AECHF. Pharmacy to dose warfarin while admitted.   Baseline INR therapeutic  Prior anticoagulation: warfarin 5 mg daily, last dose 3/2  Significant events: 3/4 - given warfarin early in AM to make up for missed dose 3/3  Today, 09/18/2016:  INR therapeutic  CBC: Hgb low but stable; Pltc WNL  Major drug interactions: on amiodarone at home; inpatient levofloxacin d/c'ed 3/6  No bleeding issues reported  Heart healthy/carb modified diet ordered  Goal of Therapy: INR 2-3  Plan:  Warfarin 4 mg today. Increasing dose today since levofloxacin interaction removed and INR beginning to trend down on 2.5 mg dosing. Takes 5 mg daily PTA.  Daily PT/INR  CBC at least q72 hr while on warfarin  Monitor for  s/sx of bleeding  Hershal Coria, PharmD, BCPS Pager: [phone] 09/18/2016 8:16 AM

## 2016-09-18 NOTE — Progress Notes (Signed)
Patient to be discharged home with [facility]. CM and MD contacted to clarify need for [facility] RN, in order to change patient's unna boots dressings. Pt requested boots to be changed now before D/C, but RN educated patient that boots should stay on for 1 week before being removed. [place] RN to f/u with dressing change. Pt concerned about Lasix dose only being 20mg  BID- pt spoke with MD who reassured her and stated that Cardiology office will be contacting her for an appt soon to assess need for Lasix increase. Patient's medications all removed from pharmacy and sent home with pt. Printed prescriptions also sent with patient- pt stated she will probably get her money at home, then go have her prescriptions filled in an [place] (stated she is a "night owl" and likes to "stay up late and look at the sky".) All belongings sent home with patient. [employer] taxi contacted and picked patient up at main entrance. Pt assisted to exit via W/C.

## 2016-09-18 NOTE — Care Management Note (Signed)
Case Management Note  Patient Details  Name: Mikal [name] MRN: [id number] Date of Birth: [birth year]  Subjective/Objective:     71 yo admitted with CAP.               Action/Plan: Pt recently moved here from [place] and does not have a PCP. This CM made a new PCP appointment for pt at [place] at [facility]. Appointment placed on AVS. Choice offered for home health services and [facility] was chosen. PT also recommending Rollator. [facility] rep also contacted for Rollator. No other CM needs communicated.    Expected Discharge Date:                  Expected Discharge Plan:  [facility]  In-House Referral:     Discharge planning Services  CM Consult  Post Acute Care Choice:  Home Health Choice offered to:  Patient  DME Arranged:  Walker rolling with seat DME Agency:  [place] Arranged:  PT, OT, Nurse's Aide [place] Agency:  [place]  Status of Service:  In process, will continue to follow  If discussed at Long Length of Stay Meetings, dates discussed:    Additional CommentsLynnell Catalan, RN 09/18/2016, 11:45 AM [phone]

## 2016-09-18 NOTE — Progress Notes (Signed)
Physical Therapy Treatment Patient Details Name: Hannah [name] MRN: [id number] DOB: [birth year] Today's Date: 09/18/2016    History of Present Illness 71 yo female admitted with Pna, back pain, fall at home. Hx of bil LE edema, A fib, DM, HTN, obesity.     PT Comments    Pt continues to participate well. O2 sats 96% on RA, 89-90% on RA during ambulation. Dyspnea 3/4. Cues for pacing and breathing throughout session. Feel pt would benefit from a 4 wheeled walker to allow for seated rest breaks, when needed.     Follow Up Recommendations  Home health PT; Home health OT; Home Health Aide; Supervision - Intermittent     Equipment Recommendations  Other (comment) (wide 4 wheeled walker)    Recommendations for Other Services       Precautions / Restrictions Precautions Precautions: Fall Precaution Comments: monitor O2 sats Restrictions Weight Bearing Restrictions: No    Mobility  Bed Mobility Overal bed mobility: Modified Independent                Transfers Overall transfer level: Needs assistance Equipment used: Rolling walker (2 wheeled) Transfers: Sit to/from Stand Sit to Stand: Min guard         General transfer comment: close guard for safety.   Ambulation/Gait Ambulation/Gait assistance: Min guard Ambulation Distance (Feet): 125 Feet Assistive device: Rolling walker (2 wheeled) Gait Pattern/deviations: Step-through pattern;Decreased stride length     General Gait Details: 2 rest breaks taken on today. close guard for safety. VCs pacing, pursed lip/deep breathinig. O2 sats 89-90% on RA. Dyspnea 3/4.    Stairs            Wheelchair Mobility    Modified Rankin (Stroke Patients Only)       Balance                                    Cognition Arousal/Alertness: Awake/alert Behavior During Therapy: WFL for tasks assessed/performed Overall Cognitive Status: Within Functional Limits for tasks assessed                       Exercises      General Comments        Pertinent Vitals/Pain Pain Assessment: Faces Faces Pain Scale: Hurts even more Pain Location: back Pain Descriptors / Indicators: Sore Pain Intervention(s): Monitored during session;Repositioned    Home Living                      Prior Function            PT Goals (current goals can now be found in the care plan section) Progress towards PT goals: Progressing toward goals    Frequency    Min 3X/week      PT Plan Current plan remains appropriate    Co-evaluation             End of Session   Activity Tolerance: Patient tolerated treatment well Patient left: in chair;with call bell/phone within reach   PT Visit Diagnosis: Muscle weakness (generalized) (M62.81);Difficulty in walking, not elsewhere classified (R26.2)     Time: 4696-2952 PT Time Calculation (min) (ACUTE ONLY): 25 min  Charges:  $Gait Training: 8-22 mins $Therapeutic Activity: 8-22 mins                    G Codes:       Ozella Almond  Starleen Blue, MPT Pager: [phone]

## 2016-09-18 NOTE — Progress Notes (Signed)
Pt daughter unable to pick her up as she is legally blind and doesn't drive. Pt states she is not on the bus line and has no money here with her. CSW to provide taxi voucher for pt. Per [facility], rollator will be delivered to pt home in 3-5 days.  Marney Doctor RN,BSN,NCM

## 2016-09-19 LAB — CULTURE, BLOOD (ROUTINE X 2)
Culture: NO GROWTH
Culture: NO GROWTH

## 2016-10-14 ENCOUNTER — Telehealth: Payer: Self-pay | Admitting: *Deleted

## 2016-10-14 NOTE — Telephone Encounter (Signed)
Notes sent to scheduling.   

## 2016-10-23 ENCOUNTER — Telehealth: Payer: Self-pay | Admitting: Cardiology

## 2016-10-23 ENCOUNTER — Encounter: Payer: Medicare Other | Admitting: Cardiology

## 2016-10-23 NOTE — Telephone Encounter (Signed)
Hannah [name] is asking for a release form to be sent to her . Please call if you have any questions .Marland Kitchen Thanks

## 2016-10-24 NOTE — Telephone Encounter (Signed)
Release Form Mailed to pt home address.

## 2016-11-10 ENCOUNTER — Encounter: Payer: Self-pay | Admitting: Cardiology

## 2016-11-10 ENCOUNTER — Ambulatory Visit (INDEPENDENT_AMBULATORY_CARE_PROVIDER_SITE_OTHER): Payer: Medicare Other | Admitting: Cardiology

## 2016-11-10 ENCOUNTER — Encounter (INDEPENDENT_AMBULATORY_CARE_PROVIDER_SITE_OTHER): Payer: Self-pay

## 2016-11-10 VITALS — BP 112/70 | HR 78 | Ht 66.0 in | Wt 275.8 lb

## 2016-11-10 DIAGNOSIS — Z79899 Other long term (current) drug therapy: Secondary | ICD-10-CM | POA: Diagnosis not present

## 2016-11-10 DIAGNOSIS — I5032 Chronic diastolic (congestive) heart failure: Secondary | ICD-10-CM | POA: Diagnosis not present

## 2016-11-10 DIAGNOSIS — I481 Persistent atrial fibrillation: Secondary | ICD-10-CM

## 2016-11-10 DIAGNOSIS — I4819 Other persistent atrial fibrillation: Secondary | ICD-10-CM

## 2016-11-10 MED ORDER — FUROSEMIDE 40 MG PO TABS: 40.0000 mg | ORAL_TABLET | Freq: Every day | ORAL | 1 refills | Status: DC

## 2016-11-10 NOTE — Patient Instructions (Addendum)
Medication Instructions:    Your physician has recommended you make the following change in your medication:  1) CHANGE Lasix to -- take 40 mg once daily.  You may take an extra dose if you gain 2 or more pounds overnight OR 4 or more pounds in a week.  Do not take extra dose for more than 3 days in a row.    --- If you need a refill on your cardiac medications before your next appointment, please call your pharmacy. ---  Labwork:  Your physician recommends that you return for lab work in: 2 weeks for BMET  Testing/Procedures:  None ordered  Follow-Up:  Your physician wants you to follow-up in: 6 months with Dr. Curt Bears.  You will receive a reminder letter in the mail two months in advance. If you don't receive a letter, please call our office to schedule the follow-up appointment.  Thank you for choosing [facility]!!   Trinidad Curet, RN [phone]

## 2016-11-10 NOTE — Progress Notes (Signed)
Electrophysiology Office Note   Date:  11/10/2016   ID:  Hannah [name], DOB 1946/03/18, MRN [id number]  PCP:  No PCP Per Patient Primary Electrophysiologist:  Deshannon Hinchliffe Meredith Leeds, MD    Chief Complaint  Patient presents with  . Pacemaker Check    CHF/CAF     History of Present Illness: Hannah [name] is a 71 y.o. female who is being seen today for the evaluation of CHF, AF at the request of No ref. provider found. Presenting today for electrophysiology evaluation.  She presented to the hospital on 09/13/16 complaining of productive cough with yellow sputum, shortness of breath, and palpitations. She was found to be in decompensated heart failure versus pneumonia. She was treated with antibiotics and Lasix. Does have a history of chronic atrial fibrillation and is on warfarin and amiodarone.  Today, she denies symptoms of palpitations, chest pain, orthopnea, PND, lower extremity edema, claudication, dizziness, presyncope, syncope, bleeding, or neurologic sequela. The patient is tolerating medications without difficulties. She has been short of breath since leaving the hospital. She had to stop and catch her breath on the way into the office today. She feels like the humidity is affecting her breathing.   Past Medical History:  Diagnosis Date  . Atrial fibrillation ([place])   . Diabetes mellitus without complication ([place])   . Glaucoma    BILATERAL  . Hypertension   . Thyroid disease    Past Surgical History:  Procedure Laterality Date  . HERNIA REPAIR     UMBILICAL  . LASIK     BILATERAL     Current Outpatient Prescriptions  Medication Sig Dispense Refill  . albuterol (PROVENTIL HFA;VENTOLIN HFA) 108 (90 Base) MCG/ACT inhaler Inhale 2 puffs into the lungs every 6 (six) hours as needed for wheezing or shortness of breath. 1 Inhaler 2  . ALPRAZolam (XANAX) 0.5 MG tablet Take 0.5 mg by mouth 2 (two) times daily as needed for anxiety.    Marland Kitchen amiodarone (PACERONE) 200 MG  tablet Take 200 mg by mouth daily.    . busPIRone (BUSPAR) 15 MG tablet Take 7.5 mg by mouth 2 (two) times daily.    . DULoxetine (CYMBALTA) 30 MG capsule Take 90 mg by mouth daily.    . furosemide (LASIX) 40 MG tablet Take 1 tablet (40 mg total) by mouth daily. You may take an extra dose if you gain 2 pds overnight or 4 pds over a week. 105 tablet 1  . levocetirizine (XYZAL) 5 MG tablet Take 5 mg by mouth every evening.    Marland Kitchen levothyroxine (SYNTHROID, LEVOTHROID) 25 MCG tablet Take 25 mcg by mouth daily before breakfast.    . metFORMIN (GLUCOPHAGE) 500 MG tablet Take 500 mg by mouth daily with breakfast.    . metoprolol (LOPRESSOR) 50 MG tablet Take 50 mg by mouth 2 (two) times daily.    . montelukast (SINGULAIR) 10 MG tablet Take 10 mg by mouth every evening.    . Potassium Chloride ER 20 MEQ TBCR Take 20 mEq by mouth daily.    . traZODone (DESYREL) 50 MG tablet Take 50-100 mg by mouth at bedtime.    Marland Kitchen warfarin (COUMADIN) 5 MG tablet Take 5 mg by mouth daily.     No current facility-administered medications for this visit.     Allergies:   Penicillins   Social History:  The patient  reports that she has never smoked. She has never used smokeless tobacco. She reports that she drinks about 0.6 oz of alcohol per  week . She reports that she does not use drugs.   Family History:  The patient's family history includes Colon cancer in her father; Heart attack in her brother and paternal grandfather; Heart disease in her brother; Lung cancer in her maternal grandfather and mother.    ROS:  Please see the history of present illness.   Otherwise, review of systems is positive for Leg swelling, cough, dyspnea on exertion, depression, back pain, muscle pain, dizziness, easy bruising, syncope.   All other systems are reviewed and negative.    PHYSICAL EXAM: VS:  BP 112/70   Pulse 78   Ht 5\' 6"  (1.676 m)   Wt 275 lb 12.8 oz (125.1 kg)   SpO2 98%   BMI 44.52 kg/m  , BMI Body mass index is 44.52  kg/m. GEN: Well nourished, well developed, in no acute distress  HEENT: normal  Neck: no JVD, carotid bruits, or masses Cardiac: RRR; no murmurs, rubs, or gallops, 2+ edema  Respiratory:  clear to auscultation bilaterally, normal work of breathing GI: soft, nontender, nondistended, + BS MS: no deformity or atrophy  Skin: warm and dry,  device pocket is well healed Neuro:  Strength and sensation are intact Psych: euthymic mood, full affect  EKG:  EKG is ordered today. Personal review of the ekg ordered 09/13/16 shows atrial fibrillation, rate 105, low voltage  Device interrogation is reviewed today in detail.  See PaceArt for details.   Recent Labs: 09/13/2016: B Natriuretic Peptide 360.4; Magnesium 1.7; TSH 3.953 09/16/2016: ALT 21 09/18/2016: BUN 41; Creatinine, Ser 1.08; Hemoglobin 9.0; Platelets 245; Potassium 5.1; Sodium 136    Lipid Panel  No results found for: CHOL, TRIG, HDL, CHOLHDL, VLDL, LDLCALC, LDLDIRECT   Wt Readings from Last 3 Encounters:  11/10/16 275 lb 12.8 oz (125.1 kg)  09/18/16 (!) 316 lb 5.8 oz (143.5 kg)      Other studies Reviewed: Additional studies/ records that were reviewed today include: 09/14/16 TTE  Review of the above records today demonstrates:  - Left ventricle: The cavity size was normal. There was mild   concentric hypertrophy. Systolic function was normal. The   estimated ejection fraction was in the range of 50% to 55%.   Although no diagnostic regional wall motion abnormality was   identified, this possibility cannot be completely excluded on the   basis of this study. - Mitral valve: Mildly calcified annulus. There was mild   regurgitation. - Left atrium: The atrium was severely dilated. - Right atrium: The atrium was moderately to severely dilated. - Pulmonary arteries: Systolic pressure was moderately increased.   PA peak pressure: 47 mm Hg (S).   ASSESSMENT AND PLAN:  1.  Persistent atrial fibrillation: On warfarin and amiodarone.  Heart rate is been well controlled. He is apparently in sinus rhythm per her Linq monitor. We'll continue her current medications.  This patients CHA2DS2-VASc Score and unadjusted Ischemic Stroke Rate (% per year) is equal to 4.8 % stroke rate/year from a score of 4  Above score calculated as 1 point each if present [CHF, HTN, DM, Vascular=MI/PAD/Aortic Plaque, Age if 65-74, or Female] Above score calculated as 2 points each if present [Age > 75, or Stroke/TIA/TE]  2. Chronic diastolic heart failure: Continues to be volume overloaded. She does take Lasix and metoprolol. Blood pressure is well controlled. I have discussed with her daily weights and adjusting Lasix based on her weights. Latarra Eagleton take an extra dose if she gains 2 pounds in a day or 4  pounds in 2 days.     Current medicines are reviewed at length with the patient today.   The patient does not have concerns regarding her medicines.  The following changes were made today:  none  Labs/ tests ordered today include:  Orders Placed This Encounter  Procedures  . Basic Metabolic Panel (BMET)     Disposition:   FU with Emaree Chiu 6 months  Signed, Zandon Talton Meredith Leeds, MD  11/10/2016 4:16 PM     [address] [place] [address] [phone] (office) [phone] (fax)

## 2016-11-12 ENCOUNTER — Telehealth: Payer: Self-pay | Admitting: Cardiology

## 2016-11-12 NOTE — Telephone Encounter (Signed)
New Message:   Hannah [name] wanted you to know ph had a 4lbs weight gain over night.Pt have been only taking 40mg  of Lasix a day. He told her that was ok unless her weight went up.He told her to take 69 when that happen.She did  Take 60 mg yesterday and today.

## 2016-11-13 ENCOUNTER — Telehealth (HOSPITAL_COMMUNITY): Payer: Self-pay | Admitting: Psychiatry

## 2016-11-13 ENCOUNTER — Ambulatory Visit (HOSPITAL_COMMUNITY): Payer: Medicare Other | Admitting: Psychiatry

## 2016-11-13 NOTE — Telephone Encounter (Signed)
Will forward to Dr. Curt Bears for [facility]

## 2016-11-14 NOTE — Telephone Encounter (Signed)
Agree with extra lasix if weight goes up more than 2 lbs in one day.

## 2016-11-17 ENCOUNTER — Encounter: Payer: Self-pay | Admitting: Cardiology

## 2016-11-17 NOTE — Telephone Encounter (Signed)
[name] reports pt experiencing wt gain of 6.4 lb over last week. Increased BLEE and abd girth secondary to swelling.   Pt experiencing dry cough and SOB -- neither are new complaints. Pt took Lasix 60 mg Tu/W/Th/Fri/Sat and 80 mg yesterday and today.  Will forward to [facility] for advisement. (can I add BMET to whatever you may order to f/u on kidney function/potassium levle with extended time taking increase Lasix?)

## 2016-11-17 NOTE — Telephone Encounter (Signed)
Hannah [name] at 11/17/2016 12:17 PM   Status: Signed    New message    [facility] calling in reference to last week messages - waiting from Dr. Shonna Chock  Update weight another 6 lbs,   [name]/O swelling: STAT is pt has developed SOB within 24 hours  1. How long have you been experiencing swelling? Since last week    2. Where is the swelling located? Abdomen, lower extremities   3.  Are you currently taking a "fluid pill"? 40 mg a day / order extra lasix - patient took 60 mg for 5 day- then 80 mg for the past two day - K+20 mg  4.  Are you currently SOB? No- no more than usual.     5.  Have you traveled recently?no

## 2016-11-17 NOTE — Telephone Encounter (Signed)
New message    [facility] calling in reference to last week messages - waiting from Dr. Shonna Chock  Update weight another 6 lbs,   Pt C/O swelling: STAT is pt has developed SOB within 24 hours  1. How long have you been experiencing swelling? Since last week    2. Where is the swelling located? Abdomen, lower extremities   3.  Are you currently taking a "fluid pill"? 40 mg a day / order extra lasix - patient took 60 mg for 5 day- then 80 mg for the past two day - K+20 mg  4.  Are you currently SOB? No- no more than usual.     5.  Have you traveled recently?no

## 2016-11-17 NOTE — Telephone Encounter (Signed)
This encounter was created in error - please disregard.

## 2016-11-18 NOTE — Telephone Encounter (Signed)
Advised pt of Dr. Macky Lower recommendations. Patient verbalized understanding and agreeable to plan.

## 2016-11-18 NOTE — Telephone Encounter (Signed)
Patient to take 80 mg BID for 3 days then 80 mg daily. Continue daily weights.

## 2016-11-19 ENCOUNTER — Telehealth: Payer: Self-pay | Admitting: Cardiology

## 2016-11-19 MED ORDER — FUROSEMIDE 40 MG PO TABS: 80.0000 mg | ORAL_TABLET | Freq: Every day | ORAL | 3 refills | Status: DC

## 2016-11-19 NOTE — Telephone Encounter (Signed)
New Message  Beth returning your call about pervious conversation on pt. Please call back to discuss

## 2016-11-19 NOTE — Telephone Encounter (Signed)
[name], Hannah [name], wanted to follow up on orders given to pt yesterday. Informed her pt instructed to increase Lasix to 80 mg BID x 3 days, then reduce to 80 mg once daily. She thanked me for calling her back and updating her.

## 2016-11-25 ENCOUNTER — Telehealth: Payer: Self-pay | Admitting: Cardiology

## 2016-11-25 NOTE — Telephone Encounter (Signed)
New message     Calling to report to Dr Curt Bears: Wt is down 4 lbs from last ov.  Still have 2-3+ edema in legs.  Pt walked yesterday with therapists and had sob. Please advise

## 2016-11-25 NOTE — Telephone Encounter (Signed)
Advised to have patient give it more time for swelling to go down, per Dr. Curt Bears. [name] reports speaking w/ pt about lifestyle changes (ie: diet, fluids, sodium, leg elevation/compression hose, etc) From what she has observed in the home or told by pt -- she eats out a lot. There is some question as to pt compliance. RN will call back with updates if necessary.

## 2016-11-26 ENCOUNTER — Ambulatory Visit (HOSPITAL_COMMUNITY): Payer: Medicare Other | Admitting: Psychiatry

## 2016-12-01 ENCOUNTER — Ambulatory Visit (INDEPENDENT_AMBULATORY_CARE_PROVIDER_SITE_OTHER): Payer: Medicare Other | Admitting: *Deleted

## 2016-12-01 DIAGNOSIS — I481 Persistent atrial fibrillation: Secondary | ICD-10-CM

## 2016-12-01 DIAGNOSIS — I4819 Other persistent atrial fibrillation: Secondary | ICD-10-CM

## 2016-12-03 NOTE — Progress Notes (Signed)
Carelink Summary Report / Loop Recorder 

## 2016-12-04 LAB — CUP PACEART REMOTE DEVICE CHECK
Implantable Pulse Generator Implant Date: 20170724
MDC IDC SESS DTM: 20180522020653

## 2016-12-17 ENCOUNTER — Encounter: Payer: Self-pay | Admitting: Cardiology

## 2016-12-31 ENCOUNTER — Ambulatory Visit (INDEPENDENT_AMBULATORY_CARE_PROVIDER_SITE_OTHER): Payer: Medicare Other | Admitting: *Deleted

## 2016-12-31 DIAGNOSIS — I481 Persistent atrial fibrillation: Secondary | ICD-10-CM | POA: Diagnosis not present

## 2016-12-31 DIAGNOSIS — I4819 Other persistent atrial fibrillation: Secondary | ICD-10-CM

## 2017-01-01 NOTE — Progress Notes (Signed)
Carelink Summary Report / Loop Recorder 

## 2017-01-07 LAB — CUP PACEART REMOTE DEVICE CHECK
Date Time Interrogation Session: 20180621020729
MDC IDC PG IMPLANT DT: 20170724

## 2017-01-15 ENCOUNTER — Telehealth: Payer: Self-pay | Admitting: Cardiology

## 2017-01-15 NOTE — Telephone Encounter (Signed)
LMOVM requesting that pt send manual transmission b/c home monitor has not updated in at least 14 days.    

## 2017-01-21 ENCOUNTER — Encounter: Payer: Self-pay | Admitting: Cardiology

## 2017-01-30 ENCOUNTER — Ambulatory Visit (INDEPENDENT_AMBULATORY_CARE_PROVIDER_SITE_OTHER): Payer: Medicare Other | Admitting: *Deleted

## 2017-01-30 DIAGNOSIS — I481 Persistent atrial fibrillation: Secondary | ICD-10-CM | POA: Diagnosis not present

## 2017-01-30 DIAGNOSIS — I4819 Other persistent atrial fibrillation: Secondary | ICD-10-CM

## 2017-02-03 NOTE — Progress Notes (Signed)
Carelink Summary Report / Loop Recorder 

## 2017-02-04 ENCOUNTER — Encounter (HOSPITAL_COMMUNITY): Payer: Self-pay | Admitting: Psychiatry

## 2017-02-04 ENCOUNTER — Ambulatory Visit (INDEPENDENT_AMBULATORY_CARE_PROVIDER_SITE_OTHER): Payer: Medicare Other | Admitting: Psychiatry

## 2017-02-04 VITALS — BP 132/74 | HR 52 | Ht 66.0 in | Wt 283.0 lb

## 2017-02-04 DIAGNOSIS — F4312 Post-traumatic stress disorder, chronic: Secondary | ICD-10-CM

## 2017-02-04 MED ORDER — DULOXETINE HCL 30 MG PO CPEP: 90.0000 mg | ORAL_CAPSULE | Freq: Every day | ORAL | 1 refills | Status: DC

## 2017-02-04 MED ORDER — BUSPIRONE HCL 15 MG PO TABS: 15.0000 mg | ORAL_TABLET | Freq: Three times a day (TID) | ORAL | 1 refills | Status: DC

## 2017-02-04 MED ORDER — TRAZODONE HCL 100 MG PO TABS: 200.0000 mg | ORAL_TABLET | Freq: Every day | ORAL | 1 refills | Status: DC
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
## 2017-02-15 LAB — CUP PACEART REMOTE DEVICE CHECK
Date Time Interrogation Session: 20180721023914
Implantable Pulse Generator Implant Date: 20170724

## 2017-02-15 NOTE — Progress Notes (Signed)
Carelink summary report received. Battery status OK. Normal device function. No new tachy episodes, brady, or pause episodes. No new AF episodes. 1 symptom- No ECG. Monthly summary reports and ROV/PRN

## 2017-03-02 ENCOUNTER — Encounter: Payer: Medicare Other | Admitting: *Deleted

## 2017-03-03 ENCOUNTER — Telehealth: Payer: Self-pay | Admitting: *Deleted

## 2017-03-03 NOTE — Telephone Encounter (Signed)
LMOVM (DPR) requesting manual transmission for review.  [facility] phone number for questions/concerns.

## 2017-03-03 NOTE — Telephone Encounter (Signed)
Spoke with patient regarding manual transmission.  Episodes did not transmit due to possible disconnected monitor.  11 pause episodes noted--most are from 02/04/17 between 1240-1255, durations 3-4sec.  1 brady episode from 02/21/17 at 1342.  Patient denies any presyncopal or syncopal symptoms on these dates/times, though she is unable to recall what she was doing at the time.  Symptom episode ECG from 02/24/17 shows SR.  Patient reports she is still taking metoprolol tartrate 50mg  daily (med list updated). Instructed patient to call our office in the future if she experiences any presyncopal or syncopal symptoms.  Patient verbalizes understanding.  Will review with Dr. Curt Bears for any additional recommendations.

## 2017-03-05 NOTE — Telephone Encounter (Signed)
Dr. Curt Bears reviewed ECGs and recommended no changes at this time as patient was asymptomatic with pause/brady episodes.  Plan to continue to monitor.

## 2017-03-18 ENCOUNTER — Telehealth: Payer: Self-pay | Admitting: Cardiology

## 2017-03-18 NOTE — Telephone Encounter (Signed)
LMOVM requesting that pt send manual transmission b/c home monitor has not updated in at least 14 days.    

## 2017-03-25 ENCOUNTER — Encounter: Payer: Self-pay | Admitting: Cardiology

## 2017-04-01 ENCOUNTER — Ambulatory Visit (INDEPENDENT_AMBULATORY_CARE_PROVIDER_SITE_OTHER): Payer: Medicare Other | Admitting: *Deleted

## 2017-04-01 DIAGNOSIS — I481 Persistent atrial fibrillation: Secondary | ICD-10-CM | POA: Diagnosis not present

## 2017-04-01 DIAGNOSIS — I4819 Other persistent atrial fibrillation: Secondary | ICD-10-CM

## 2017-04-01 NOTE — Progress Notes (Signed)
Carelink Summary Report / Loop Recorder 

## 2017-04-02 ENCOUNTER — Ambulatory Visit (INDEPENDENT_AMBULATORY_CARE_PROVIDER_SITE_OTHER): Payer: Medicare Other | Admitting: Licensed Clinical Social Worker

## 2017-04-02 DIAGNOSIS — F4310 Post-traumatic stress disorder, unspecified: Secondary | ICD-10-CM

## 2017-04-02 LAB — CUP PACEART REMOTE DEVICE CHECK
Date Time Interrogation Session: 20180919104008
MDC IDC PG IMPLANT DT: 20170724

## 2017-04-06 ENCOUNTER — Telehealth: Payer: Self-pay | Admitting: *Deleted

## 2017-04-06 NOTE — Telephone Encounter (Signed)
-----   Message from Will Meredith Leeds, MD sent at 04/03/2017 11:22 AM EDT ----- Abnormal LINQ reviewed. Notable for evidence of heart block, 2:1. Plan to stop metoprolol.

## 2017-04-06 NOTE — Telephone Encounter (Signed)
LMOVM requesting call back to the [facility].  Gave direct phone number.  Will advise patient of Dr. Macky Lower recommendation to STOP metoprolol.  Will also schedule f/u with Dr. Curt Bears, due 04/2017 per recall.

## 2017-04-08 ENCOUNTER — Encounter: Payer: Self-pay | Admitting: Cardiology

## 2017-04-08 NOTE — Telephone Encounter (Signed)
LMOVM requesting call back to the [facility].  Gave direct phone number.

## 2017-04-08 NOTE — Telephone Encounter (Signed)
Ms. Hannah [name] is aware to stop taking metoprolol, she has already taken it today but will not take it tomorrow.  Manual transmission received for review.  Ms. Hannah [name] would like her coumadin managed by the [facility]- I will route this message to Trinidad Curet, RN for that.

## 2017-04-29 ENCOUNTER — Other Ambulatory Visit: Payer: Self-pay | Admitting: Family Medicine

## 2017-04-29 DIAGNOSIS — M5432 Sciatica, left side: Secondary | ICD-10-CM

## 2017-05-01 ENCOUNTER — Ambulatory Visit (INDEPENDENT_AMBULATORY_CARE_PROVIDER_SITE_OTHER): Payer: Medicare Other | Admitting: *Deleted

## 2017-05-01 DIAGNOSIS — I481 Persistent atrial fibrillation: Secondary | ICD-10-CM

## 2017-05-01 DIAGNOSIS — I4819 Other persistent atrial fibrillation: Secondary | ICD-10-CM

## 2017-05-01 LAB — CUP PACEART REMOTE DEVICE CHECK
Implantable Pulse Generator Implant Date: 20170724
MDC IDC SESS DTM: 20181019104126

## 2017-05-01 NOTE — Progress Notes (Signed)
Carelink Summary Report / Loop Recorder 

## 2017-05-06 ENCOUNTER — Ambulatory Visit: Payer: Self-pay | Admitting: Licensed Clinical Social Worker

## 2017-05-07 ENCOUNTER — Ambulatory Visit (INDEPENDENT_AMBULATORY_CARE_PROVIDER_SITE_OTHER): Payer: Medicare Other | Admitting: Psychiatry

## 2017-05-07 ENCOUNTER — Encounter (HOSPITAL_COMMUNITY): Payer: Self-pay | Admitting: Psychiatry

## 2017-05-07 VITALS — BP 130/76 | HR 76 | Ht 66.0 in | Wt 289.2 lb

## 2017-05-07 DIAGNOSIS — F5104 Psychophysiologic insomnia: Secondary | ICD-10-CM | POA: Diagnosis not present

## 2017-05-07 DIAGNOSIS — F4312 Post-traumatic stress disorder, chronic: Secondary | ICD-10-CM

## 2017-05-07 MED ORDER — BUSPIRONE HCL 15 MG PO TABS
15.0000 mg | ORAL_TABLET | Freq: Three times a day (TID) | ORAL | 1 refills | Status: DC
Start: 2017-05-07 — End: 2017-11-05

## 2017-05-07 MED ORDER — TRAZODONE HCL 100 MG PO TABS: 200.0000 mg | ORAL_TABLET | Freq: Every day | ORAL | 1 refills | Status: DC

## 2017-05-07 MED ORDER — DULOXETINE HCL 30 MG PO CPEP: 90.0000 mg | ORAL_CAPSULE | Freq: Every day | ORAL | 1 refills | Status: DC

## 2017-05-07 NOTE — Progress Notes (Signed)
BH MD/PA/NP OP Progress Note  05/07/2017 3:13 PM Hannah [name]  MRN:  [id number]  Chief Complaint: medication management HPI: Hannah [name] reports that her mood and anxiety appear to be more stable with the BuSpar.  She has not had any intolerance.  She continues to ruminate about stressors related to her ex-husband but is less fixated on these issues.  She continues to present as fairly histrionic at times, and has an odd interpersonal manner in how she relates.  She reports that she and her daughter continue to get settled into their new place here in [place].  She is looking forward to the holidays this fall and spending time with her family.  She denies any safety issues.  She is agreeable to follow-up in 6 months or sooner if needed.  Visit Diagnosis:    ICD-10-CM   1. Psychophysiological insomnia F51.04   2. Chronic post-traumatic stress disorder (PTSD) F43.12 traZODone (DESYREL) 100 MG tablet    DULoxetine (CYMBALTA) 30 MG capsule    busPIRone (BUSPAR) 15 MG tablet    Past Psychiatric History: See intake H&P for full details. Reviewed, with no updates at this time.   Past Medical History:  Past Medical History:  Diagnosis Date  . Atrial fibrillation ([place])   . Diabetes mellitus without complication ([place])   . Glaucoma    BILATERAL  . Hypertension   . Thyroid disease     Past Surgical History:  Procedure Laterality Date  . APPENDECTOMY    . CHOLECYSTECTOMY    . HERNIA REPAIR     UMBILICAL  . LASIK     BILATERAL    Family Psychiatric History: See intake H&P for full details. Reviewed, with no updates at this time.   Family History:  Family History  Problem Relation Age of Onset  . Lung cancer Mother   . Colon cancer Father   . Heart attack Brother   . Heart disease Brother   . Lung cancer Maternal Grandfather   . Heart attack Paternal Grandfather     Social History:  Social History   Social History  . Marital status: Legally Separated    Spouse name: N/A  . Number of children: N/A  . Years of education: N/A   Social History Main Topics  . Smoking status: Never Smoker  . Smokeless tobacco: Never Used  . Alcohol use 0.6 oz/week    1 Glasses of wine per week     Comment: occ  . Drug use: No  . Sexual activity: No   Other Topics Concern  . None   Social History Narrative  . None    Allergies:  Allergies  Allergen Reactions  . Penicillins     Possibility; father and brother allergic     Metabolic Disorder Labs: Lab Results  Component Value Date   HGBA1C 6.0 (H) 09/13/2016   MPG 126 09/13/2016   No results found for: PROLACTIN No results found for: CHOL, TRIG, HDL, CHOLHDL, VLDL, LDLCALC Lab Results  Component Value Date   TSH 3.953 09/13/2016    Therapeutic Level Labs: No results found for: LITHIUM No results found for: VALPROATE No components found for:  CBMZ  Current Medications: Current Outpatient Prescriptions  Medication Sig Dispense Refill  . amiodarone (PACERONE) 200 MG tablet Take 200 mg by mouth daily.    . busPIRone (BUSPAR) 15 MG tablet Take 1 tablet (15 mg total) by mouth 3 (three) times daily. 270 tablet 1  . DULoxetine (CYMBALTA) 30 MG capsule Take  3 capsules (90 mg total) by mouth daily. 270 capsule 1  . furosemide (LASIX) 40 MG tablet Take 2 tablets (80 mg total) by mouth daily. You may take an extra dose if you gain 2 pds overnight or 4 pds over a week. 60 tablet 3  . levocetirizine (XYZAL) 5 MG tablet Take 5 mg by mouth every evening.    Marland Kitchen levothyroxine (SYNTHROID, LEVOTHROID) 25 MCG tablet Take 25 mcg by mouth daily before breakfast.    . metFORMIN (GLUCOPHAGE) 500 MG tablet Take 500 mg by mouth daily with breakfast.    . montelukast (SINGULAIR) 10 MG tablet Take 10 mg by mouth every evening.    . Potassium Chloride ER 20 MEQ TBCR Take 20 mEq by mouth daily.    . traZODone (DESYREL) 100 MG tablet Take 2 tablets (200 mg total) by mouth at bedtime. 180 tablet 1  . warfarin  (COUMADIN) 5 MG tablet Take 5 mg by mouth daily.    Marland Kitchen albuterol (PROVENTIL HFA;VENTOLIN HFA) 108 (90 Base) MCG/ACT inhaler Inhale 2 puffs into the lungs every 6 (six) hours as needed for wheezing or shortness of breath. (Patient not taking: Reported on 02/04/2017) 1 Inhaler 2   No current facility-administered medications for this visit.      Musculoskeletal: Strength & Muscle Tone: within normal limits Gait & Station: normal Patient leans: N/A  Psychiatric Specialty Exam: ROS  Blood pressure 130/76, pulse 76, height 5\' 6"  (1.676 m), weight 289 lb 3.2 oz (131.2 kg).Body mass index is 46.68 kg/m.  General Appearance: Casual and Fairly Groomed  Eye Contact:  Fair  Speech:  Clear and Coherent  Volume:  Normal  Mood:  Euthymic  Affect:  Appropriate and Congruent  Thought Process:  Goal Directed and Descriptions of Associations: Tangential  Orientation:  Full (Time, Place, and Person)  Thought Content: Logical   Suicidal Thoughts:  No  Homicidal Thoughts:  No  Memory:  Immediate;   Fair  Judgement:  Fair  Insight:  Present and Shallow  Psychomotor Activity:  Normal  Concentration:  Attention Span: Fair  Recall:  [employer] of Knowledge: Fair  Language: Fair  Akathisia:  Negative  Handed:  Right  AIMS (if indicated): not done  Assets:  Communication Skills Desire for Improvement Financial Resources/Insurance Housing Social Support Transportation Vocational/Educational  ADL's:  Intact  Cognition: WNL  Sleep:  Good   Screenings:   Assessment and Plan:  Hannah [name] is a 71 year old female with a fairly histrionic personality, with chronic PTSD and insomnia, who presents today for medication management.  She is stable on the current medication regimen and appears to have had some improvement in her anxiety with BuSpar.  1. Psychophysiological insomnia   2. Chronic post-traumatic stress disorder (PTSD)     Status of current problems: stable  Labs Ordered: No  orders of the defined types were placed in this encounter.   Labs Reviewed: n/a  Collateral Obtained/Records Reviewed: n/a  Plan:  Continue BuSpar 15 mg 3 times daily Continue Cymbalta 90 mg daily Continue trazodone 200 mg at bedtime Return to clinic in 6 months  I spent 25 minutes with the patient in direct face-to-face clinical care.  Greater than 50% of this time was spent in counseling and coordination of care with the patient.    Aundra Dubin, MD 05/07/2017, 3:13 PM

## 2017-05-12 ENCOUNTER — Other Ambulatory Visit: Payer: Self-pay

## 2017-05-14 ENCOUNTER — Encounter: Payer: Self-pay | Admitting: Cardiology

## 2017-05-14 NOTE — Progress Notes (Deleted)
Electrophysiology Office Note   Date:  05/14/2017   ID:  Hannah [name], DOB 06/25/1946, MRN [id number]  PCP:  Hannah Amel, MD Primary Electrophysiologist:  Hannah Haw, MD    No chief complaint on file.    History of Present Illness: Hannah [name] is a 71 y.o. female who is being seen today for the evaluation of CHF, AF at the request of Koirala, Dibas, MD. Presenting today for electrophysiology evaluation.  She presented to the hospital on 09/13/16 complaining of productive cough with yellow sputum, shortness of breath, and palpitations. She was found to be in decompensated heart failure versus pneumonia. She was treated with antibiotics and Lasix. Does have a history of chronic atrial fibrillation and is on warfarin and amiodarone.  Today, denies symptoms of palpitations, chest pain, shortness of breath, orthopnea, PND, lower extremity edema, claudication, dizziness, presyncope, syncope, bleeding, or neurologic sequela. The patient is tolerating medications without difficulties. ***    Past Medical History:  Diagnosis Date  . Atrial fibrillation ([place])   . Diabetes mellitus without complication ([place])   . Glaucoma    BILATERAL  . Hypertension   . Thyroid disease    Past Surgical History:  Procedure Laterality Date  . APPENDECTOMY    . CHOLECYSTECTOMY    . HERNIA REPAIR     UMBILICAL  . LASIK     BILATERAL     Current Outpatient Prescriptions  Medication Sig Dispense Refill  . albuterol (PROVENTIL HFA;VENTOLIN HFA) 108 (90 Base) MCG/ACT inhaler Inhale 2 puffs into the lungs every 6 (six) hours as needed for wheezing or shortness of breath. (Patient not taking: Reported on 02/04/2017) 1 Inhaler 2  . amiodarone (PACERONE) 200 MG tablet Take 200 mg by mouth daily.    . busPIRone (BUSPAR) 15 MG tablet Take 1 tablet (15 mg total) by mouth 3 (three) times daily. 270 tablet 1  . DULoxetine (CYMBALTA) 30 MG capsule Take 3 capsules (90 mg total) by mouth  daily. 270 capsule 1  . furosemide (LASIX) 40 MG tablet Take 2 tablets (80 mg total) by mouth daily. You may take an extra dose if you gain 2 pds overnight or 4 pds over a week. 60 tablet 3  . levocetirizine (XYZAL) 5 MG tablet Take 5 mg by mouth every evening.    Marland Kitchen levothyroxine (SYNTHROID, LEVOTHROID) 25 MCG tablet Take 25 mcg by mouth daily before breakfast.    . metFORMIN (GLUCOPHAGE) 500 MG tablet Take 500 mg by mouth daily with breakfast.    . montelukast (SINGULAIR) 10 MG tablet Take 10 mg by mouth every evening.    . Potassium Chloride ER 20 MEQ TBCR Take 20 mEq by mouth daily.    . traZODone (DESYREL) 100 MG tablet Take 2 tablets (200 mg total) by mouth at bedtime. 180 tablet 1  . warfarin (COUMADIN) 5 MG tablet Take 5 mg by mouth daily.     No current facility-administered medications for this visit.     Allergies:   Penicillins   Social History:  The patient  reports that she has never smoked. She has never used smokeless tobacco. She reports that she drinks about 0.6 oz of alcohol per week . She reports that she does not use drugs.   Family History:  The patient's family history includes Colon cancer in her father; Heart attack in her brother and paternal grandfather; Heart disease in her brother; Lung cancer in her maternal grandfather and mother.    ROS:  Please see the  history of present illness.   Otherwise, review of systems is positive for ***.   All other systems are reviewed and negative.   PHYSICAL EXAM: VS:  There were no vitals taken for this visit. , BMI There is no height or weight on file to calculate BMI. GEN: Well nourished, well developed, in no acute distress  HEENT: normal  Neck: no JVD, carotid bruits, or masses Cardiac: ***RRR; no murmurs, rubs, or gallops,no edema  Respiratory:  clear to auscultation bilaterally, normal work of breathing GI: soft, nontender, nondistended, + BS MS: no deformity or atrophy  Skin: warm and dry, ***device site well  healed Neuro:  Strength and sensation are intact Psych: euthymic mood, full affect  EKG:  EKG {ACTION; IS/IS [id number]} ordered today. Personal review of the ekg ordered *** shows ***  ***Personal review of the device interrogation today. Results in [place]: 09/13/2016: B Natriuretic Peptide 360.4; Magnesium 1.7; TSH 3.953 09/16/2016: ALT 21 09/18/2016: BUN 41; Creatinine, Ser 1.08; Hemoglobin 9.0; Platelets 245; Potassium 5.1; Sodium 136    Lipid Panel  No results found for: CHOL, TRIG, HDL, CHOLHDL, VLDL, LDLCALC, LDLDIRECT   Wt Readings from Last 3 Encounters:  11/10/16 275 lb 12.8 oz (125.1 kg)  09/18/16 (!) 316 lb 5.8 oz (143.5 kg)      Other studies Reviewed: Additional studies/ records that were reviewed today include: 09/14/16 TTE  Review of the above records today demonstrates:  - Left ventricle: The cavity size was normal. There was mild   concentric hypertrophy. Systolic function was normal. The   estimated ejection fraction was in the range of 50% to 55%.   Although no diagnostic regional wall motion abnormality was   identified, this possibility cannot be completely excluded on the   basis of this study. - Mitral valve: Mildly calcified annulus. There was mild   regurgitation. - Left atrium: The atrium was severely dilated. - Right atrium: The atrium was moderately to severely dilated. - Pulmonary arteries: Systolic pressure was moderately increased.   PA peak pressure: 47 mm Hg (S).   ASSESSMENT AND PLAN:  1.  Persistent atrial fibrillation: On warfarin and amiodarone. Heart rate is been well controlled. He is apparently in sinus rhythm per her Linq monitor. We'll continue her current medications.***  This patients CHA2DS2-VASc Score and unadjusted Ischemic Stroke Rate (% per year) is equal to 4.8 % stroke rate/year from a score of 4  Above score calculated as 1 point each if present [CHF, HTN, DM, Vascular=MI/PAD/Aortic Plaque, Age if 65-74, or  Female] Above score calculated as 2 points each if present [Age > 75, or Stroke/TIA/TE]   2. Chronic diastolic heart failure: Continues to be volume overloaded. She does take Lasix and metoprolol. Blood pressure is well controlled. I have discussed with her daily weights and adjusting Lasix based on her weights. Aariz Maish take an extra dose if she gains 2 pounds in a day or 4 pounds in 2 days.***     Current medicines are reviewed at length with the patient today.   The patient does not have concerns regarding her medicines.  The following changes were made today:  ***  Labs/ tests ordered today include:  No orders of the defined types were placed in this encounter.    Disposition:   FU with Alrick Cubbage *** months  Signed, Flavio Lindroth Meredith Leeds, MD  05/14/2017 8:26 AM     [address] [place] [address] [phone] (office) (  [phone] (fax)

## 2017-05-19 ENCOUNTER — Other Ambulatory Visit: Payer: Self-pay | Admitting: Cardiology

## 2017-05-22 ENCOUNTER — Other Ambulatory Visit: Payer: Self-pay

## 2017-05-29 ENCOUNTER — Encounter: Payer: Self-pay | Admitting: Cardiology

## 2017-06-01 ENCOUNTER — Ambulatory Visit (INDEPENDENT_AMBULATORY_CARE_PROVIDER_SITE_OTHER): Payer: Medicare Other | Admitting: *Deleted

## 2017-06-01 DIAGNOSIS — I4819 Other persistent atrial fibrillation: Secondary | ICD-10-CM

## 2017-06-01 DIAGNOSIS — I481 Persistent atrial fibrillation: Secondary | ICD-10-CM | POA: Diagnosis not present

## 2017-06-01 NOTE — Progress Notes (Signed)
Carelink Summary Report / Loop Recorder 

## 2017-06-02 ENCOUNTER — Encounter: Payer: Self-pay | Admitting: Cardiology

## 2017-06-02 ENCOUNTER — Ambulatory Visit (INDEPENDENT_AMBULATORY_CARE_PROVIDER_SITE_OTHER): Payer: Medicare Other | Admitting: Cardiology

## 2017-06-02 VITALS — BP 144/82 | HR 74 | Ht 66.0 in | Wt 281.0 lb

## 2017-06-02 DIAGNOSIS — I481 Persistent atrial fibrillation: Secondary | ICD-10-CM

## 2017-06-02 DIAGNOSIS — I4891 Unspecified atrial fibrillation: Secondary | ICD-10-CM

## 2017-06-02 DIAGNOSIS — Z789 Other specified health status: Secondary | ICD-10-CM

## 2017-06-02 DIAGNOSIS — I5032 Chronic diastolic (congestive) heart failure: Secondary | ICD-10-CM | POA: Diagnosis not present

## 2017-06-02 DIAGNOSIS — I4819 Other persistent atrial fibrillation: Secondary | ICD-10-CM

## 2017-06-02 DIAGNOSIS — Z7901 Long term (current) use of anticoagulants: Secondary | ICD-10-CM

## 2017-06-02 NOTE — Progress Notes (Signed)
Electrophysiology Office Note   Date:  06/02/2017   ID:  Hannah [name], DOB May 18, 1946, MRN [id number]  PCP:  Lujean Amel, MD Primary Electrophysiologist:  Constance Haw, MD    Chief Complaint  Patient presents with  . Pacemaker Check    Persistent Afib     History of Present Illness: Hannah [name] is a 71 y.o. female who is being seen today for the evaluation of CHF, AF at the request of Koirala, Dibas, MD. Presenting today for electrophysiology evaluation.  She presented to the hospital on 09/13/16 complaining of productive cough with yellow sputum, shortness of breath, and palpitations. She was found to be in decompensated heart failure versus pneumonia. She was treated with antibiotics and Lasix. Does have a history of atrial fibrillation and is on warfarin and amiodarone.  Today, denies symptoms of palpitations, chest pain, shortness of breath, orthopnea, PND, lower extremity edema, claudication, dizziness, presyncope, syncope, bleeding, or neurologic sequela. The patient is tolerating medications without difficulties.  She is feeling well without complaint.  She has not noted any fatigue, weakness, or palpitations that would indicate atrial fibrillation.   Past Medical History:  Diagnosis Date  . Atrial fibrillation ([place])   . Diabetes mellitus without complication ([place])   . Glaucoma    BILATERAL  . Hypertension   . Thyroid disease    Past Surgical History:  Procedure Laterality Date  . APPENDECTOMY    . CHOLECYSTECTOMY    . HERNIA REPAIR     UMBILICAL  . LASIK     BILATERAL     Current Outpatient Medications  Medication Sig Dispense Refill  . amiodarone (PACERONE) 200 MG tablet Take 200 mg by mouth daily.    . busPIRone (BUSPAR) 15 MG tablet Take 1 tablet (15 mg total) by mouth 3 (three) times daily. 270 tablet 1  . DULoxetine (CYMBALTA) 30 MG capsule Take 3 capsules (90 mg total) by mouth daily. 270 capsule 1  . furosemide (LASIX) 40 MG  tablet Take 2 tablets (80 mg total) by mouth daily. You may take an extra dose if you gain 2 pds overnight or 4 pds over a week. 60 tablet 3  . levocetirizine (XYZAL) 5 MG tablet Take 5 mg by mouth every evening.    Marland Kitchen levothyroxine (SYNTHROID, LEVOTHROID) 25 MCG tablet Take 25 mcg by mouth daily before breakfast.    . metFORMIN (GLUCOPHAGE) 500 MG tablet Take 500 mg by mouth daily with breakfast.    . montelukast (SINGULAIR) 10 MG tablet Take 10 mg by mouth every evening.    . Potassium Chloride ER 20 MEQ TBCR Take 20 mEq by mouth daily.    . traZODone (DESYREL) 100 MG tablet Take 2 tablets (200 mg total) by mouth at bedtime. 180 tablet 1  . warfarin (COUMADIN) 5 MG tablet Take 5 mg by mouth daily.     No current facility-administered medications for this visit.     Allergies:   Penicillins   Social History:  The patient  reports that  has never smoked. she has never used smokeless tobacco. She reports that she drinks about 0.6 oz of alcohol per week. She reports that she does not use drugs.   Family History:  The patient's family history includes Colon cancer in her father; Heart attack in her brother and paternal grandfather; Heart disease in her brother; Lung cancer in her maternal grandfather and mother.   ROS:  Please see the history of present illness.   Otherwise, review of systems  is positive for none.   All other systems are reviewed and negative.   PHYSICAL EXAM: VS:  BP (!) 144/82   Pulse 74   Ht 5\' 6"  (1.676 m)   Wt 281 lb (127.5 kg)   BMI 45.35 kg/m  , BMI Body mass index is 45.35 kg/m. GEN: Well nourished, well developed, in no acute distress  HEENT: normal  Neck: no JVD, carotid bruits, or masses Cardiac: RRR; no murmurs, rubs, or gallops,no edema  Respiratory:  clear to auscultation bilaterally, normal work of breathing GI: soft, nontender, nondistended, + BS MS: no deformity or atrophy  Skin: warm and dry, device site well healed Neuro:  Strength and sensation are  intact Psych: euthymic mood, full affect  EKG:  EKG is ordered today. Personal review of the ekg ordered shows sinus rhythm, 1 degree AV block  Personal review of the device interrogation today. Results in [place]: 09/13/2016: B Natriuretic Peptide 360.4; Magnesium 1.7; TSH 3.953 09/16/2016: ALT 21 09/18/2016: BUN 41; Creatinine, Ser 1.08; Hemoglobin 9.0; Platelets 245; Potassium 5.1; Sodium 136    Lipid Panel  No results found for: CHOL, TRIG, HDL, CHOLHDL, VLDL, LDLCALC, LDLDIRECT   Wt Readings from Last 3 Encounters:  06/02/17 281 lb (127.5 kg)  11/10/16 275 lb 12.8 oz (125.1 kg)  09/18/16 (!) 316 lb 5.8 oz (143.5 kg)      Other studies Reviewed: Additional studies/ records that were reviewed today include: 09/14/16 TTE  Review of the above records today demonstrates:  - Left ventricle: The cavity size was normal. There was mild   concentric hypertrophy. Systolic function was normal. The   estimated ejection fraction was in the range of 50% to 55%.   Although no diagnostic regional wall motion abnormality was   identified, this possibility cannot be completely excluded on the   basis of this study. - Mitral valve: Mildly calcified annulus. There was mild   regurgitation. - Left atrium: The atrium was severely dilated. - Right atrium: The atrium was moderately to severely dilated. - Pulmonary arteries: Systolic pressure was moderately increased.   PA peak pressure: 47 mm Hg (S).   ASSESSMENT AND PLAN:  1.  Persistent atrial fibrillation: Currently on warfarin and amiodarone.  Her link monitor shows very little atrial fibrillation.  She is in sinus rhythm today.  No changes at this time.  This patients CHA2DS2-VASc Score and unadjusted Ischemic Stroke Rate (% per year) is equal to 4.8 % stroke rate/year from a score of 4  Above score calculated as 1 point each if present [CHF, HTN, DM, Vascular=MI/PAD/Aortic Plaque, Age if 65-74, or Female] Above score  calculated as 2 points each if present [Age > 75, or Stroke/TIA/TE]   2. Chronic diastolic heart failure: Does not appear to be volume overloaded at this time.  No lower extremity edema.  Continue with current management.     Current medicines are reviewed at length with the patient today.   The patient does not have concerns regarding her medicines.  The following changes were made today:  none  Labs/ tests ordered today include:  No orders of the defined types were placed in this encounter.    Disposition:   FU with Vinia Jemmott 6 months  Signed, Viral Schramm Meredith Leeds, MD  06/02/2017 12:34 PM     [place] [address] [phone] (office) [phone] (fax)

## 2017-06-02 NOTE — Patient Instructions (Addendum)
Medication Instructions:  Your physician recommends that you continue on your current medications as directed. Please refer to the Current Medication list given to you today.  * If you need a refill on your cardiac medications before your next appointment, please call your pharmacy. *  Labwork: INR today  Testing/Procedures: None ordered  Follow-Up: You have been referred to establish your INRs checks with our Coumadin clinic.  Your physician wants you to follow-up in: 6 months with Dr. Curt Bears.  You will receive a reminder letter in the mail two months in advance. If you don't receive a letter, please call our office to schedule the follow-up appointment.  Thank you for choosing [facility]!!   Trinidad Curet, RN [phone]

## 2017-06-03 LAB — PROTIME-INR
INR: 2.6 — AB (ref 0.8–1.2)
PROTHROMBIN TIME: 25.2 s — AB (ref 9.1–12.0)

## 2017-06-08 ENCOUNTER — Telehealth: Payer: Self-pay | Admitting: Cardiology

## 2017-06-08 NOTE — Telephone Encounter (Signed)
Pt calling for INR results, gave her the 2.6 result and she would like to know, (she is aware normal) what her range should be

## 2017-06-15 ENCOUNTER — Telehealth: Payer: Self-pay | Admitting: Cardiology

## 2017-06-15 LAB — CUP PACEART REMOTE DEVICE CHECK
Date Time Interrogation Session: 20181118104529
MDC IDC PG IMPLANT DT: 20170724

## 2017-06-15 NOTE — Telephone Encounter (Signed)
New message    Patient calling for last INR result. Please call

## 2017-06-15 NOTE — Telephone Encounter (Signed)
Pts daughter was following up on phone telephone that her mom had placed and told her INR was low. I informed pt and daughter who both were on phone that the INR from [facility] was 2.6 and in therapeutic range for Afib w/o Hx of valve replacement. Reminded them of new pt cvrr appt tomorrow.

## 2017-06-16 ENCOUNTER — Ambulatory Visit (INDEPENDENT_AMBULATORY_CARE_PROVIDER_SITE_OTHER): Payer: Medicare Other

## 2017-06-16 DIAGNOSIS — Z7901 Long term (current) use of anticoagulants: Secondary | ICD-10-CM | POA: Diagnosis not present

## 2017-06-16 DIAGNOSIS — I4891 Unspecified atrial fibrillation: Secondary | ICD-10-CM | POA: Diagnosis not present

## 2017-06-16 LAB — CUP PACEART INCLINIC DEVICE CHECK
Implantable Pulse Generator Implant Date: 20170724
MDC IDC SESS DTM: 20181120182120

## 2017-06-16 LAB — POCT INR: INR: 2.6

## 2017-06-16 NOTE — Patient Instructions (Signed)
Continue on same dosage 5mg  daily.  Recheck in 4 weeks.

## 2017-06-30 ENCOUNTER — Ambulatory Visit (INDEPENDENT_AMBULATORY_CARE_PROVIDER_SITE_OTHER): Payer: Medicare Other | Admitting: *Deleted

## 2017-06-30 DIAGNOSIS — I4891 Unspecified atrial fibrillation: Secondary | ICD-10-CM

## 2017-06-30 NOTE — Progress Notes (Signed)
Carelink Summary Report / Loop Recorder 

## 2017-07-20 LAB — CUP PACEART REMOTE DEVICE CHECK
Date Time Interrogation Session: 20181218153948
MDC IDC PG IMPLANT DT: 20170724

## 2017-07-24 ENCOUNTER — Ambulatory Visit (INDEPENDENT_AMBULATORY_CARE_PROVIDER_SITE_OTHER): Payer: Medicare Other | Admitting: Pharmacist

## 2017-07-24 DIAGNOSIS — Z7901 Long term (current) use of anticoagulants: Secondary | ICD-10-CM | POA: Diagnosis not present

## 2017-07-24 DIAGNOSIS — I4891 Unspecified atrial fibrillation: Secondary | ICD-10-CM

## 2017-07-24 LAB — POCT INR: INR: 1.5

## 2017-07-24 NOTE — Patient Instructions (Signed)
Description   Take 7.5mg  today and tomorrow, then continue on same dosage 5mg  daily.  Recheck in 2 weeks.

## 2017-07-30 ENCOUNTER — Ambulatory Visit (INDEPENDENT_AMBULATORY_CARE_PROVIDER_SITE_OTHER): Payer: Medicare Other | Admitting: *Deleted

## 2017-07-30 DIAGNOSIS — I4891 Unspecified atrial fibrillation: Secondary | ICD-10-CM

## 2017-07-30 NOTE — Progress Notes (Signed)
Carelink Summary Report / Loop Recorder 

## 2017-08-04 LAB — CUP PACEART REMOTE DEVICE CHECK
Date Time Interrogation Session: 20190117171027
MDC IDC PG IMPLANT DT: 20170724

## 2017-08-17 ENCOUNTER — Ambulatory Visit (INDEPENDENT_AMBULATORY_CARE_PROVIDER_SITE_OTHER): Payer: Medicare Other | Admitting: *Deleted

## 2017-08-17 DIAGNOSIS — Z7901 Long term (current) use of anticoagulants: Secondary | ICD-10-CM | POA: Diagnosis not present

## 2017-08-17 DIAGNOSIS — Z5181 Encounter for therapeutic drug level monitoring: Secondary | ICD-10-CM | POA: Diagnosis not present

## 2017-08-17 DIAGNOSIS — I4891 Unspecified atrial fibrillation: Secondary | ICD-10-CM | POA: Diagnosis not present

## 2017-08-17 LAB — POCT INR: INR: 2.4

## 2017-08-17 NOTE — Patient Instructions (Signed)
Description   Continue on same dosage 5mg  daily.  Recheck in 3 weeks.

## 2017-08-29 ENCOUNTER — Ambulatory Visit (INDEPENDENT_AMBULATORY_CARE_PROVIDER_SITE_OTHER): Payer: Medicare Other | Admitting: Psychiatry

## 2017-08-29 ENCOUNTER — Encounter (HOSPITAL_COMMUNITY): Payer: Self-pay | Admitting: Psychiatry

## 2017-08-29 VITALS — BP 142/86 | HR 87 | Ht 66.0 in | Wt 295.4 lb

## 2017-08-29 DIAGNOSIS — Z6379 Other stressful life events affecting family and household: Secondary | ICD-10-CM | POA: Diagnosis not present

## 2017-08-29 DIAGNOSIS — F419 Anxiety disorder, unspecified: Secondary | ICD-10-CM

## 2017-08-29 DIAGNOSIS — F4323 Adjustment disorder with mixed anxiety and depressed mood: Secondary | ICD-10-CM | POA: Diagnosis not present

## 2017-08-29 DIAGNOSIS — F4310 Post-traumatic stress disorder, unspecified: Secondary | ICD-10-CM | POA: Diagnosis not present

## 2017-08-29 DIAGNOSIS — G4700 Insomnia, unspecified: Secondary | ICD-10-CM | POA: Diagnosis not present

## 2017-08-29 DIAGNOSIS — R450 Nervousness: Secondary | ICD-10-CM

## 2017-08-29 DIAGNOSIS — Z630 Problems in relationship with spouse or partner: Secondary | ICD-10-CM

## 2017-08-29 MED ORDER — PRAZOSIN HCL 1 MG PO CAPS: 1.0000 mg | ORAL_CAPSULE | Freq: Every day | ORAL | 1 refills | Status: DC
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
## 2017-08-31 ENCOUNTER — Ambulatory Visit (INDEPENDENT_AMBULATORY_CARE_PROVIDER_SITE_OTHER): Payer: Medicare Other | Admitting: *Deleted

## 2017-08-31 DIAGNOSIS — I4891 Unspecified atrial fibrillation: Secondary | ICD-10-CM | POA: Diagnosis not present

## 2017-08-31 NOTE — Progress Notes (Signed)
Carelink Summary Report / Loop Recorder 

## 2017-09-10 ENCOUNTER — Telehealth: Payer: Self-pay | Admitting: Cardiology

## 2017-09-10 NOTE — Telephone Encounter (Signed)
LMOVM requesting that pt send manual transmission b/c home monitor has not updated in at least 14 days.    

## 2017-09-12 ENCOUNTER — Other Ambulatory Visit (HOSPITAL_COMMUNITY): Payer: Self-pay

## 2017-09-25 ENCOUNTER — Ambulatory Visit (INDEPENDENT_AMBULATORY_CARE_PROVIDER_SITE_OTHER): Payer: Medicare Other | Admitting: *Deleted

## 2017-09-25 DIAGNOSIS — I4891 Unspecified atrial fibrillation: Secondary | ICD-10-CM | POA: Diagnosis not present

## 2017-09-25 DIAGNOSIS — Z7901 Long term (current) use of anticoagulants: Secondary | ICD-10-CM

## 2017-09-25 LAB — POCT INR: INR: 2.3

## 2017-09-25 NOTE — Patient Instructions (Signed)
Description   Continue on same dosage 5mg daily.  Recheck in 4 weeks.  Call us with any medication changes # [phone].      

## 2017-09-28 LAB — CUP PACEART REMOTE DEVICE CHECK
Date Time Interrogation Session: 20190218014031
Implantable Pulse Generator Implant Date: 20170724

## 2017-10-02 ENCOUNTER — Ambulatory Visit (INDEPENDENT_AMBULATORY_CARE_PROVIDER_SITE_OTHER): Payer: Medicare Other | Admitting: *Deleted

## 2017-10-02 DIAGNOSIS — I4891 Unspecified atrial fibrillation: Secondary | ICD-10-CM

## 2017-10-05 NOTE — Progress Notes (Signed)
Carelink Summary Report / Loop Recorder 

## 2017-10-21 ENCOUNTER — Telehealth: Payer: Self-pay | Admitting: Cardiology

## 2017-10-21 NOTE — Telephone Encounter (Signed)
Opened in error

## 2017-10-22 ENCOUNTER — Telehealth: Payer: Self-pay | Admitting: *Deleted

## 2017-10-22 NOTE — Telephone Encounter (Signed)
LMOVM (DPR) requesting manual Carelink transmission.  Gave direct DC number for questions/concerns.

## 2017-10-27 ENCOUNTER — Other Ambulatory Visit (HOSPITAL_COMMUNITY): Payer: Self-pay

## 2017-10-27 DIAGNOSIS — F4323 Adjustment disorder with mixed anxiety and depressed mood: Secondary | ICD-10-CM

## 2017-10-27 MED ORDER — PRAZOSIN HCL 1 MG PO CAPS: 1.0000 mg | ORAL_CAPSULE | Freq: Every day | ORAL | 0 refills | Status: DC

## 2017-10-29 NOTE — Telephone Encounter (Signed)
LMOVM (DPR) requesting manual transmission.  Instructions given.  Gave direct DC number for questions/concerns.

## 2017-11-04 ENCOUNTER — Ambulatory Visit (INDEPENDENT_AMBULATORY_CARE_PROVIDER_SITE_OTHER): Payer: Medicare Other | Admitting: *Deleted

## 2017-11-04 DIAGNOSIS — I4891 Unspecified atrial fibrillation: Secondary | ICD-10-CM | POA: Diagnosis not present

## 2017-11-05 ENCOUNTER — Encounter (HOSPITAL_COMMUNITY): Payer: Self-pay | Admitting: Psychiatry

## 2017-11-05 ENCOUNTER — Ambulatory Visit (INDEPENDENT_AMBULATORY_CARE_PROVIDER_SITE_OTHER): Payer: Medicare Other | Admitting: Psychiatry

## 2017-11-05 VITALS — BP 144/80 | HR 80 | Ht 66.0 in

## 2017-11-05 DIAGNOSIS — Z635 Disruption of family by separation and divorce: Secondary | ICD-10-CM

## 2017-11-05 DIAGNOSIS — G8929 Other chronic pain: Secondary | ICD-10-CM | POA: Diagnosis not present

## 2017-11-05 DIAGNOSIS — F4312 Post-traumatic stress disorder, chronic: Secondary | ICD-10-CM

## 2017-11-05 DIAGNOSIS — Z79899 Other long term (current) drug therapy: Secondary | ICD-10-CM

## 2017-11-05 DIAGNOSIS — F604 Histrionic personality disorder: Secondary | ICD-10-CM

## 2017-11-05 MED ORDER — TRAZODONE HCL 100 MG PO TABS: 200.0000 mg | ORAL_TABLET | Freq: Every day | ORAL | 1 refills | Status: DC

## 2017-11-05 MED ORDER — DULOXETINE HCL 30 MG PO CPEP: 90.0000 mg | ORAL_CAPSULE | Freq: Every day | ORAL | 1 refills | Status: AC

## 2017-11-05 MED ORDER — BUSPIRONE HCL 15 MG PO TABS: 15.0000 mg | ORAL_TABLET | Freq: Three times a day (TID) | ORAL | 1 refills | Status: DC
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
## 2017-11-06 NOTE — Progress Notes (Signed)
Carelink Summary Report / Loop Recorder 

## 2017-11-07 ENCOUNTER — Telehealth (HOSPITAL_COMMUNITY): Payer: Self-pay

## 2017-11-07 NOTE — Telephone Encounter (Signed)
Patient called and said that she was unclear where her prescriptions were sent to on the day of her office visit. She also said that she needs medication for her nightmares about her past. The patient had a lot of anxiety. Pharmacy that the patient wants the medication sent to is [facility] on [address]. Please advise

## 2017-11-09 NOTE — Telephone Encounter (Signed)
The prescriptions were sent to [facility] [name] on [address]. I called and let the patient know this.

## 2017-11-09 NOTE — Telephone Encounter (Signed)
Thank you :)

## 2017-11-10 LAB — CUP PACEART REMOTE DEVICE CHECK
Implantable Pulse Generator Implant Date: 20170724
MDC IDC SESS DTM: 20190323024341

## 2017-11-19 ENCOUNTER — Encounter: Payer: Self-pay | Admitting: *Deleted

## 2017-11-19 NOTE — Telephone Encounter (Signed)
LMOVM (DPR) requesting manual transmission.  Instructions given.  Gave direct DC number for questions/concerns.  Mailed letter and Carelink instructions to patient's home address.

## 2017-11-30 LAB — CUP PACEART REMOTE DEVICE CHECK
Date Time Interrogation Session: 20190425044008
Implantable Pulse Generator Implant Date: 20170724

## 2017-12-08 ENCOUNTER — Ambulatory Visit (INDEPENDENT_AMBULATORY_CARE_PROVIDER_SITE_OTHER): Payer: Medicare Other | Admitting: *Deleted

## 2017-12-08 DIAGNOSIS — I4891 Unspecified atrial fibrillation: Secondary | ICD-10-CM | POA: Diagnosis not present

## 2017-12-08 NOTE — Progress Notes (Signed)
Carelink Summary Report / Loop Recorder 

## 2017-12-22 ENCOUNTER — Telehealth: Payer: Self-pay

## 2017-12-22 NOTE — Telephone Encounter (Signed)
LMOVM requesting that pt send manual transmission b/c home monitor has not updated in at least 14 days.   Spoke w/ pt and requested that she send a manual transmission b/c her home monitor has not updated in at least 14 days.   

## 2017-12-23 ENCOUNTER — Telehealth (HOSPITAL_COMMUNITY): Payer: Self-pay

## 2017-12-23 ENCOUNTER — Other Ambulatory Visit: Payer: Self-pay | Admitting: *Deleted

## 2017-12-23 NOTE — Telephone Encounter (Signed)
Okay to refill amiodarone? I do not see where Dr Curt Bears has ever refilled this for the patient. Please advise. Thanks, MI

## 2017-12-24 ENCOUNTER — Other Ambulatory Visit (HOSPITAL_COMMUNITY): Payer: Self-pay | Admitting: Psychiatry

## 2017-12-24 DIAGNOSIS — F4312 Post-traumatic stress disorder, chronic: Secondary | ICD-10-CM

## 2017-12-24 MED ORDER — AMIODARONE HCL 200 MG PO TABS: 200.0000 mg | ORAL_TABLET | Freq: Every day | ORAL | 3 refills | Status: DC

## 2017-12-24 NOTE — Telephone Encounter (Signed)
°*  STAT* If patient is at the pharmacy, call can be transferred to refill team.   1. Which medications need to be refilled? (please list name of each medication and dose if known) Amiodarone 200mg   2. Which pharmacy/location (including street and city if local pharmacy) is medication to be sent to?[phone]  3. Do they need a 30 day or 90 day supply? [place]

## 2017-12-29 ENCOUNTER — Encounter: Payer: Self-pay | Admitting: Cardiology

## 2017-12-30 LAB — CUP PACEART REMOTE DEVICE CHECK
Date Time Interrogation Session: 20190528101055
Implantable Pulse Generator Implant Date: 20170724

## 2018-01-04 ENCOUNTER — Telehealth: Payer: Self-pay | Admitting: *Deleted

## 2018-01-04 NOTE — Telephone Encounter (Signed)
Received return call from patient's daughter (patient in background).  Spent ~72min on the phone discussing disconnected monitor and assisting with a manual Carelink transmission for review.  Transmission was received successfully, advised that patient and daughter do not need to do anything else at this time.  Patient's daughter verbalizes understanding is aware that our office will contact her in the future for any additional manual transmissions.  3 "pause" episodes are false, ECGs show undersensing. 1 brady episode is appropriate, occurred on 12/08/17 at 1810, duration 8sec, no reported symptoms. 1 available "AF" ECG appears ?SB w/PACs.  All ECGs printed and placed in Dr. Kathalene Frames folder for review.

## 2018-01-04 NOTE — Telephone Encounter (Signed)
LMOVM (DPR) requesting manual Carelink transmission for review.  Gave instructions and [facility] phone number for questions/concerns.  Patient's monitor was disconnected and recently reconnected to the network.  Need manual for review of 3 "pause" episodes, 1 "brady" episode and 5 "AF" episodes that have not transmitted automatically.

## 2018-01-11 ENCOUNTER — Ambulatory Visit (INDEPENDENT_AMBULATORY_CARE_PROVIDER_SITE_OTHER): Payer: PPO | Admitting: *Deleted

## 2018-01-11 DIAGNOSIS — I4891 Unspecified atrial fibrillation: Secondary | ICD-10-CM | POA: Diagnosis not present

## 2018-01-11 NOTE — Progress Notes (Signed)
Carelink Summary Report / Loop Recorder 

## 2018-01-12 ENCOUNTER — Ambulatory Visit (INDEPENDENT_AMBULATORY_CARE_PROVIDER_SITE_OTHER): Payer: PPO | Admitting: Cardiology

## 2018-01-12 ENCOUNTER — Ambulatory Visit (INDEPENDENT_AMBULATORY_CARE_PROVIDER_SITE_OTHER): Payer: PPO | Admitting: *Deleted

## 2018-01-12 ENCOUNTER — Encounter: Payer: Self-pay | Admitting: Cardiology

## 2018-01-12 VITALS — BP 130/88 | HR 69 | Ht 66.0 in | Wt 302.8 lb

## 2018-01-12 DIAGNOSIS — I481 Persistent atrial fibrillation: Secondary | ICD-10-CM | POA: Diagnosis not present

## 2018-01-12 DIAGNOSIS — R0602 Shortness of breath: Secondary | ICD-10-CM

## 2018-01-12 DIAGNOSIS — I4891 Unspecified atrial fibrillation: Secondary | ICD-10-CM | POA: Diagnosis not present

## 2018-01-12 DIAGNOSIS — Z7901 Long term (current) use of anticoagulants: Secondary | ICD-10-CM | POA: Diagnosis not present

## 2018-01-12 DIAGNOSIS — Z79899 Other long term (current) drug therapy: Secondary | ICD-10-CM

## 2018-01-12 DIAGNOSIS — I4819 Other persistent atrial fibrillation: Secondary | ICD-10-CM

## 2018-01-12 LAB — POCT INR: INR: 2.4 (ref 2.0–3.0)

## 2018-01-12 NOTE — Patient Instructions (Signed)
Description   Continue on same dosage 5mg  daily.  Recheck in 6 weeks.  Call us with any medication changes # [phone].

## 2018-01-12 NOTE — Progress Notes (Signed)
Electrophysiology Office Note   Date:  01/12/2018   ID:  Hannah [name], DOB [birth year], MRN [id number]  PCP:  Lujean Amel, MD Primary Electrophysiologist:  Constance Haw, MD    Chief Complaint  Patient presents with  . Pacemaker Check    Persistent Afib     History of Present Illness: Hannah [name] is a 72 y.o. female who is being seen today for the evaluation of CHF, AF at the request of Koirala, Dibas, MD. Presenting today for electrophysiology evaluation.  She presented to the hospital on 09/13/16 complaining of productive cough with yellow sputum, shortness of breath, and palpitations. She was found to be in decompensated heart failure versus pneumonia. She was treated with antibiotics and Lasix. Does have a history of atrial fibrillation and is on warfarin and amiodarone.  Today, denies symptoms of palpitations, chest pain, orthopnea, PND, claudication, dizziness, presyncope, syncope, bleeding, or neurologic sequela. The patient is tolerating medications without difficulties.  Her main complaint is of shortness of breath.  She is having difficulty exerting herself due to shortness of breath.  She was working with physical therapy who noted that her oxygen saturations were getting down into the high 70s.  She has not had PND or orthopnea type symptoms.  She has gained some weight and has had lower extremity edema.   Past Medical History:  Diagnosis Date  . Atrial fibrillation ([place])   . Diabetes mellitus without complication ([place])   . Glaucoma    BILATERAL  . Hypertension   . Thyroid disease    Past Surgical History:  Procedure Laterality Date  . APPENDECTOMY    . CHOLECYSTECTOMY    . HERNIA REPAIR     UMBILICAL  . LASIK     BILATERAL     Current Outpatient Medications  Medication Sig Dispense Refill  . amiodarone (PACERONE) 200 MG tablet Take 1 tablet (200 mg total) by mouth daily. 90 tablet 3  . busPIRone (BUSPAR) 15 MG tablet TAKE 1 TABLET THREE  TIMES A DAY 270 tablet 1  . DULoxetine (CYMBALTA) 30 MG capsule Take 3 capsules (90 mg total) by mouth daily. 270 capsule 1  . furosemide (LASIX) 40 MG tablet Take 2 tablets (80 mg total) by mouth daily. You may take an extra dose if you gain 2 pds overnight or 4 pds over a week. 60 tablet 3  . levocetirizine (XYZAL) 5 MG tablet Take 5 mg by mouth every evening.    Marland Kitchen levothyroxine (SYNTHROID, LEVOTHROID) 25 MCG tablet Take 25 mcg by mouth daily before breakfast.    . metFORMIN (GLUCOPHAGE) 500 MG tablet Take 500 mg by mouth daily with breakfast.    . montelukast (SINGULAIR) 10 MG tablet Take 10 mg by mouth every evening.    . Potassium Chloride ER 20 MEQ TBCR Take 20 mEq by mouth daily.    . rosuvastatin (CRESTOR) 5 MG tablet Take 5 mg by mouth daily.    . traZODone (DESYREL) 100 MG tablet Take 2 tablets (200 mg total) by mouth at bedtime. 180 tablet 1  . warfarin (COUMADIN) 5 MG tablet Take 5 mg by mouth daily.     No current facility-administered medications for this visit.     Allergies:   Penicillins   Social History:  The patient  reports that she has never smoked. She has never used smokeless tobacco. She reports that she drinks about 0.6 oz of alcohol per week. She reports that she does not use drugs.   Family History:  The patient's family history includes Colon cancer in her father; Heart attack in her brother and paternal grandfather; Heart disease in her brother; Lung cancer in her maternal grandfather and mother.   ROS:  Please see the history of present illness.   Otherwise, review of systems is positive for cough, depression, anxiety, balance problems.   All other systems are reviewed and negative.   PHYSICAL EXAM: VS:  BP 130/88   Pulse 69   Ht 5\' 6"  (1.676 m)   Wt (!) 302 lb 12.8 oz (137.3 kg)   BMI 48.87 kg/m  , BMI Body mass index is 48.87 kg/m. GEN: Well nourished, well developed, in no acute distress  HEENT: normal  Neck: no JVD, carotid bruits, or  masses Cardiac: RRR; no murmurs, rubs, or gallops, 2+ edema  Respiratory:  clear to auscultation bilaterally, normal work of breathing GI: soft, nontender, nondistended, + BS MS: no deformity or atrophy  Skin: warm and dry, device site well healed Neuro:  Strength and sensation are intact Psych: euthymic mood, full affect  EKG:  EKG is ordered today. Personal review of the ekg ordered shows sinus rhythm, right bundle branch block, rate 69  Personal review of the device interrogation today. Results in [place]: No results found for requested labs within last 8760 hours.    Lipid Panel  No results found for: CHOL, TRIG, HDL, CHOLHDL, VLDL, LDLCALC, LDLDIRECT   Wt Readings from Last 3 Encounters:  01/12/18 (!) 302 lb 12.8 oz (137.3 kg)  06/02/17 281 lb (127.5 kg)  11/10/16 275 lb 12.8 oz (125.1 kg)      Other studies Reviewed: Additional studies/ records that were reviewed today include: 09/14/16 TTE  Review of the above records today demonstrates:  - Left ventricle: The cavity size was normal. There was mild   concentric hypertrophy. Systolic function was normal. The   estimated ejection fraction was in the range of 50% to 55%.   Although no diagnostic regional wall motion abnormality was   identified, this possibility cannot be completely excluded on the   basis of this study. - Mitral valve: Mildly calcified annulus. There was mild   regurgitation. - Left atrium: The atrium was severely dilated. - Right atrium: The atrium was moderately to severely dilated. - Pulmonary arteries: Systolic pressure was moderately increased.   PA peak pressure: 47 mm Hg (S).   ASSESSMENT AND PLAN:  1.  Persistent atrial fibrillation: Currently on warfarin and amiodarone.  Minimal atrial fibrillation on her Linq monitor.  No changes.  Will check amiodarone labs today.  This patients CHA2DS2-VASc Score and unadjusted Ischemic Stroke Rate (% per year) is equal to 4.8 % stroke  rate/year from a score of 4  Above score calculated as 1 point each if present [CHF, HTN, DM, Vascular=MI/PAD/Aortic Plaque, Age if 65-74, or Female] Above score calculated as 2 points each if present [Age > 75, or Stroke/TIA/TE]   2. Chronic diastolic heart failure: Significant lower extremity edema associated with shortness of breath.  It is likely that she has a significant amount of volume overload from her chronic diastolic heart failure.  We will check a comprehensive metabolic today as well as a BNP.  She does have a new right bundle branch block and thus we will also check a transthoracic echo due to her new diastolic heart failure symptoms.  She will call us back with her actual dose of Lasix.  Will likely double that dose when they call back.  Current medicines are reviewed at length with the patient today.   The patient does not have concerns regarding her medicines.  The following changes were made today:  none  Labs/ tests ordered today include:  Orders Placed This Encounter  Procedures  . Pro b natriuretic peptide (BNP)  . Basic Metabolic Panel (BMET)  . Hepatic function panel  . TSH  . EKG 12-Lead  . ECHOCARDIOGRAM COMPLETE     Disposition:   FU with Will Camnitz 3 months  Signed, Will Meredith Leeds, MD  01/12/2018 11:38 AM     [facility] [address] [place] [address] [phone] (office) [phone] (fax)

## 2018-01-12 NOTE — Patient Instructions (Addendum)
Medication Instructions: Your physician recommends that you continue on your current medications as directed. Please refer to the Current Medication list given to you today.   Labwork: Your physician recommends that you have lab work today: BNP, BMET, LFTs and TSH  Procedures/Testing: Your physician has requested that you have an echocardiogram. Echocardiography is a painless test that uses sound waves to create images of your heart. It provides your doctor with information about the size and shape of your heart and how well your heart's chambers and valves are working. This procedure takes approximately one hour. There are no restrictions for this procedure.  Follow-Up: Your physician recommends that you schedule a follow-up appointment in: 3 months with Dr. Curt Bears.   If you need a refill on your cardiac medications before your next appointment, please call your pharmacy.

## 2018-01-13 LAB — HEPATIC FUNCTION PANEL
ALK PHOS: 50 IU/L (ref 39–117)
ALT: 10 IU/L (ref 0–32)
AST: 10 IU/L (ref 0–40)
Albumin: 4.1 g/dL (ref 3.5–4.8)
BILIRUBIN, DIRECT: 0.19 mg/dL (ref 0.00–0.40)
Bilirubin Total: 0.8 mg/dL (ref 0.0–1.2)
Total Protein: 7.3 g/dL (ref 6.0–8.5)

## 2018-01-13 LAB — PRO B NATRIURETIC PEPTIDE: NT-Pro BNP: 151 pg/mL (ref 0–301)

## 2018-01-13 LAB — BASIC METABOLIC PANEL
BUN/Creatinine Ratio: 21 (ref 12–28)
BUN: 27 mg/dL (ref 8–27)
CO2: 26 mmol/L (ref 20–29)
CREATININE: 1.31 mg/dL — AB (ref 0.57–1.00)
Calcium: 9 mg/dL (ref 8.7–10.3)
Chloride: 100 mmol/L (ref 96–106)
GFR calc Af Amer: 48 mL/min/{1.73_m2} — ABNORMAL LOW (ref >59)
GFR, EST NON AFRICAN AMERICAN: 41 mL/min/{1.73_m2} — AB (ref >59)
GLUCOSE: 95 mg/dL (ref 65–99)
Potassium: 4 mmol/L (ref 3.5–5.2)
SODIUM: 140 mmol/L (ref 134–144)

## 2018-01-13 LAB — TSH: TSH: 12.07 u[IU]/mL — AB (ref 0.450–4.500)

## 2018-01-18 ENCOUNTER — Other Ambulatory Visit: Payer: Self-pay

## 2018-01-18 ENCOUNTER — Ambulatory Visit (HOSPITAL_COMMUNITY): Payer: PPO | Attending: Cardiology

## 2018-01-18 DIAGNOSIS — R0602 Shortness of breath: Secondary | ICD-10-CM | POA: Diagnosis not present

## 2018-01-18 DIAGNOSIS — Z6841 Body Mass Index (BMI) 40.0 and over, adult: Secondary | ICD-10-CM | POA: Diagnosis not present

## 2018-01-18 DIAGNOSIS — I119 Hypertensive heart disease without heart failure: Secondary | ICD-10-CM | POA: Diagnosis not present

## 2018-01-18 DIAGNOSIS — I481 Persistent atrial fibrillation: Secondary | ICD-10-CM | POA: Insufficient documentation

## 2018-01-18 DIAGNOSIS — Z8249 Family history of ischemic heart disease and other diseases of the circulatory system: Secondary | ICD-10-CM | POA: Diagnosis not present

## 2018-01-18 DIAGNOSIS — E119 Type 2 diabetes mellitus without complications: Secondary | ICD-10-CM | POA: Diagnosis not present

## 2018-01-18 DIAGNOSIS — R609 Edema, unspecified: Secondary | ICD-10-CM | POA: Insufficient documentation

## 2018-01-18 DIAGNOSIS — I4819 Other persistent atrial fibrillation: Secondary | ICD-10-CM

## 2018-01-18 LAB — CUP PACEART INCLINIC DEVICE CHECK
Date Time Interrogation Session: 20190708134857
Implantable Pulse Generator Implant Date: 20170724

## 2018-01-19 ENCOUNTER — Encounter: Payer: Self-pay | Admitting: Cardiology

## 2018-01-19 NOTE — Telephone Encounter (Signed)
New Message     Returning call to get lab results.

## 2018-01-19 NOTE — Telephone Encounter (Signed)
This encounter was created in error - please disregard.

## 2018-01-28 ENCOUNTER — Other Ambulatory Visit: Payer: Self-pay | Admitting: Cardiology

## 2018-02-01 ENCOUNTER — Telehealth: Payer: Self-pay | Admitting: Cardiology

## 2018-02-01 NOTE — Telephone Encounter (Signed)
Spent almost 20 min on the phone w/ pt & dtr. Results reviewed w/ pt and her dtr. Pt reports she has not increased her Lasix yet.  She reports legs are still same as when she was in the office at the beginning of this month. She currently takes 80 mg daily Informed that I will have Dr. Curt Bears review and will call her w/ recommendation/s.  Dtr also informs me that pt sees Dr. Renaldo Reel w/ [facility].  He did "some testing end of last year". I followed up w/ his office who tells me that he performed several procedures at the end of last year (Laser to left small and great saphenous veins.  Laser to right leg.  Several ultrasound guided injections) Their office will fax information for Dr. Curt Bears to review.

## 2018-02-01 NOTE — Telephone Encounter (Signed)
New  Message:       Pt is calling to get the results of her labs

## 2018-02-09 LAB — CUP PACEART REMOTE DEVICE CHECK
Implantable Pulse Generator Implant Date: 20170724
MDC IDC SESS DTM: 20190630103800

## 2018-02-12 ENCOUNTER — Ambulatory Visit (INDEPENDENT_AMBULATORY_CARE_PROVIDER_SITE_OTHER): Payer: PPO | Admitting: *Deleted

## 2018-02-12 DIAGNOSIS — I4891 Unspecified atrial fibrillation: Secondary | ICD-10-CM | POA: Diagnosis not present

## 2018-02-12 NOTE — Progress Notes (Signed)
Carelink Summary Report / Loop Recorder 

## 2018-02-16 NOTE — Telephone Encounter (Signed)
Follow up  ° ° °Patient is returning call.  °

## 2018-03-01 ENCOUNTER — Telehealth: Payer: Self-pay | Admitting: *Deleted

## 2018-03-01 DIAGNOSIS — I872 Venous insufficiency (chronic) (peripheral): Secondary | ICD-10-CM

## 2018-03-01 DIAGNOSIS — I509 Heart failure, unspecified: Secondary | ICD-10-CM

## 2018-03-01 NOTE — Telephone Encounter (Signed)
Patient called to request a refill on furosemide be sent to [facility]. She put her daughter on the phone to speak with me. Daughter, Deanna(Hannah [name]) is requesting a call back as she has several questions. She stated that they never received a call back in regards to patients labs which she states that her furosemide dose would be based on. She is questioning if the patient holds a dose one day because she will be out of the house, should she double up the following day? Patient and her daughter would also like to know what the appointment on October 7 is for. Deanna can be reached at [phone]. She wanted to know when she could expect to receive a call back and I made her aware that it would not be today as you were not in the office but patient has medication on hand still.  Thanks, MI

## 2018-03-02 NOTE — Telephone Encounter (Signed)
Informed that I have left several messages since 7/22 to discuss/follow up.  Explained that I have been leaving  Messages on pt phone. Dtr would like to know: - if pt can not take Lasix one day, can she dble up the next day instead.  Explains that they use [employer] bus travel and so when she goes out of the house they are gone for at least 1/2 the day d/t city transportation. - pt currently taking Lasix 80 mg QD - how is pt supposed to exercise when she is on Lasix and always has extra fluid weight?  Informed that I would speak with Dr. Curt Bears tomorrow and let her know recommendation/s.  Dtr is agreeable to plan.

## 2018-03-02 NOTE — Telephone Encounter (Signed)
See 8/19 telephone note for further documentation of this.

## 2018-03-05 MED ORDER — POTASSIUM CHLORIDE ER 20 MEQ PO TBCR: 20.0000 meq | EXTENDED_RELEASE_TABLET | Freq: Every day | ORAL | 1 refills | Status: DC

## 2018-03-05 MED ORDER — AMIODARONE HCL 200 MG PO TABS: 200.0000 mg | ORAL_TABLET | Freq: Every day | ORAL | 1 refills | Status: DC

## 2018-03-05 MED ORDER — FUROSEMIDE 40 MG PO TABS: 80.0000 mg | ORAL_TABLET | Freq: Every day | ORAL | 1 refills | Status: DC

## 2018-03-05 NOTE — Telephone Encounter (Signed)
Spent almost 22 minutes on the phone w/ pt & dtr. Advised ok for mom to double up on Lasix (take it BID) if she misses taking it the day before. Advised that she needs to establish with a general cardiologist to follow HF & needs. They would also like cardiologist that could follow her vascular issues - so that doctor could follow both her HF and vascular.  They are aware referral will be placed and office will contact them to arrange an appt. (she is currently being treated by Dr. Renaldo Reel at [facility])  Records from [place] will be forwarded to [place] office for appt w/ card/vascular physician.  TSH also elevated in July.  Advised that they discuss this w/ PCP and sent result to their office.  Will advise Dr. Curt Bears also.  They are also requesting refills on her cardiac medications.  Informed that I would sent 90 day supply w/ 1 refill to pharmacy for her Amiodarone, Lasix and potassium supplementation. Patient & dtr verbalized understanding and agreeable to plan.

## 2018-03-08 DIAGNOSIS — I890 Lymphedema, not elsewhere classified: Secondary | ICD-10-CM | POA: Diagnosis not present

## 2018-03-08 DIAGNOSIS — I83893 Varicose veins of bilateral lower extremities with other complications: Secondary | ICD-10-CM | POA: Diagnosis not present

## 2018-03-09 ENCOUNTER — Ambulatory Visit (INDEPENDENT_AMBULATORY_CARE_PROVIDER_SITE_OTHER): Payer: PPO | Admitting: *Deleted

## 2018-03-09 DIAGNOSIS — I4891 Unspecified atrial fibrillation: Secondary | ICD-10-CM | POA: Diagnosis not present

## 2018-03-09 DIAGNOSIS — Z7901 Long term (current) use of anticoagulants: Secondary | ICD-10-CM

## 2018-03-09 LAB — POCT INR: INR: 2.4 (ref 2.0–3.0)

## 2018-03-09 NOTE — Patient Instructions (Signed)
Description   Continue on same dosage 5mg  daily.  Recheck in 6 weeks.  Call us with any medication changes # [phone].

## 2018-03-10 DIAGNOSIS — I5032 Chronic diastolic (congestive) heart failure: Secondary | ICD-10-CM | POA: Diagnosis not present

## 2018-03-10 DIAGNOSIS — B351 Tinea unguium: Secondary | ICD-10-CM | POA: Diagnosis not present

## 2018-03-10 DIAGNOSIS — F321 Major depressive disorder, single episode, moderate: Secondary | ICD-10-CM | POA: Diagnosis not present

## 2018-03-10 DIAGNOSIS — R7301 Impaired fasting glucose: Secondary | ICD-10-CM | POA: Diagnosis not present

## 2018-03-10 DIAGNOSIS — R5381 Other malaise: Secondary | ICD-10-CM | POA: Diagnosis not present

## 2018-03-10 DIAGNOSIS — I872 Venous insufficiency (chronic) (peripheral): Secondary | ICD-10-CM | POA: Diagnosis not present

## 2018-03-10 DIAGNOSIS — F5101 Primary insomnia: Secondary | ICD-10-CM | POA: Diagnosis not present

## 2018-03-10 DIAGNOSIS — E039 Hypothyroidism, unspecified: Secondary | ICD-10-CM | POA: Diagnosis not present

## 2018-03-17 ENCOUNTER — Ambulatory Visit (INDEPENDENT_AMBULATORY_CARE_PROVIDER_SITE_OTHER): Payer: PPO | Admitting: *Deleted

## 2018-03-17 DIAGNOSIS — I4891 Unspecified atrial fibrillation: Secondary | ICD-10-CM | POA: Diagnosis not present

## 2018-03-17 NOTE — Progress Notes (Signed)
Carelink Summary Report / Loop Recorder 

## 2018-03-24 LAB — CUP PACEART REMOTE DEVICE CHECK
Implantable Pulse Generator Implant Date: 20170724
MDC IDC SESS DTM: 20190802113926

## 2018-03-31 ENCOUNTER — Ambulatory Visit (INDEPENDENT_AMBULATORY_CARE_PROVIDER_SITE_OTHER): Payer: PPO | Admitting: Cardiovascular Disease

## 2018-03-31 ENCOUNTER — Encounter: Payer: Self-pay | Admitting: Cardiovascular Disease

## 2018-03-31 DIAGNOSIS — I480 Paroxysmal atrial fibrillation: Secondary | ICD-10-CM | POA: Diagnosis not present

## 2018-03-31 DIAGNOSIS — I5030 Unspecified diastolic (congestive) heart failure: Secondary | ICD-10-CM | POA: Diagnosis not present

## 2018-03-31 NOTE — Patient Instructions (Signed)
Medication Instructions:  Your physician recommends that you continue on your current medications as directed. Please refer to the Current Medication list given to you today.   Labwork: none  Testing/Procedures: none  Follow-Up: You have been referred to [place] wants you to follow-up in: 12 months with Dr. Gwenlyn Found. You will receive a reminder letter in the mail two months in advance. If you don't receive a letter, please call our office to schedule the follow-up appointment.   Any Other Special Instructions Will Be Listed Below (If Applicable).     If you need a refill on your cardiac medications before your next appointment, please call your pharmacy.

## 2018-03-31 NOTE — Assessment & Plan Note (Signed)
History of PAF with a Linq monitor implanted by Dr. Curt Bears on Coumadin anticoagulation which we follow.

## 2018-03-31 NOTE — Assessment & Plan Note (Signed)
History of diastolic heart failure with recent echo performed 01/18/2018 which was entirely normal without diastolic dysfunction.  She is on p.o. diuretics with significant lower extremity edema.  She does not avoid salt.  I suspect this is multifactorial and probably related to venous insufficiency and and/or lymphedema.

## 2018-03-31 NOTE — Progress Notes (Signed)
03/31/2018 Hannah [name]   10/25/1945  [id number]  Primary Physician Lujean Amel, MD Primary Cardiologist: Lorretta Harp MD Renae Gloss  HPI:  Hannah [name] is a 72 y.o. severely overweight divorced Caucasian female mother of one daughter no grandchildren who was a Building services engineer in the past.  She was referred by Dr. Dr. Curt Bears to be established in my practice because of ongoing risk factors.  She does have paroxysmal A. fib with a Linq recorder implanted on Coumadin anticoagulation.  She has obstructive sleep apnea but refuses to wear CPAP.  She does have treated hypertension, diabetes and hyperlipidemia.  Brother did have a stent but otherwise there is no family history.  She is never had a heart attack or stroke.  She denies chest pain or shortness of breath.  She had a venous ablation for varicosities but denies claudication.   No outpatient medications have been marked as taking for the 03/31/18 encounter (Appointment) with Lorretta Harp, MD.     Allergies  Allergen Reactions  . Penicillins     Possibility; father and brother allergic     Social History   Socioeconomic History  . Marital status: Legally Separated    Spouse name: Not on file  . Number of children: Not on file  . Years of education: Not on file  . Highest education level: Not on file  Occupational History  . Not on file  Social Needs  . Financial resource strain: Not on file  . Food insecurity:    Worry: Not on file    Inability: Not on file  . Transportation needs:    Medical: Not on file    Non-medical: Not on file  Tobacco Use  . Smoking status: Never Smoker  . Smokeless tobacco: Never Used  Substance and Sexual Activity  . Alcohol use: Yes    Alcohol/week: 1.0 standard drinks    Types: 1 Glasses of wine per week    Comment: occ  . Drug use: No  . Sexual activity: Never  Lifestyle  . Physical activity:    Days per week: Not on file    Minutes per session: Not on file  . Stress: Not on file  Relationships  . Social connections:    Talks on phone: Not on file    Gets together: Not on file    Attends religious service: Not on file    Active member of club or organization: Not on file    Attends meetings of clubs or organizations: Not on file    Relationship status: Not on file  . Intimate partner violence:    Fear of current or ex partner: Not on file    Emotionally abused: Not on file    Physically abused: Not on file    Forced sexual activity: Not on file  Other Topics Concern  . Not on file  Social History Narrative  . Not on file     Review of Systems: General: negative for chills, fever, night sweats or weight changes.  Cardiovascular: negative for chest pain, dyspnea on exertion, edema, orthopnea, palpitations, paroxysmal nocturnal dyspnea or shortness of breath Dermatological: negative for rash Respiratory: negative for cough or wheezing Urologic: negative for hematuria Abdominal: negative for nausea, vomiting, diarrhea, bright red blood per rectum, melena, or hematemesis Neurologic: negative for visual changes, syncope, or dizziness All other systems reviewed and are otherwise negative except as noted above.    There were no  vitals taken for this visit.  General appearance: alert and no distress Neck: no adenopathy, no carotid bruit, no JVD, supple, symmetrical, trachea midline and thyroid not enlarged, symmetric, no tenderness/mass/nodules Lungs: clear to auscultation bilaterally Heart: regular rate and rhythm, S1, S2 normal, no murmur, click, rub or gallop Extremities: extremities normal, atraumatic, no cyanosis or edema Pulses: 2+ and symmetric Skin: Skin color, texture, turgor normal. No rashes or lesions Neurologic: Alert and oriented X 3, normal strength and tone. Normal symmetric reflexes. Normal coordination and gait  EKG not performed today  ASSESSMENT AND PLAN:   CHF (congestive heart  failure) (HCC) History of diastolic heart failure with recent echo performed 01/18/2018 which was entirely normal without diastolic dysfunction.  She is on p.o. diuretics with significant lower extremity edema.  She does not avoid salt.  I suspect this is multifactorial and probably related to venous insufficiency and and/or lymphedema.  Atrial fibrillation ([place]) [I48.91] History of PAF with a Linq monitor implanted by Dr. Curt Bears on Coumadin anticoagulation which we follow.      Lorretta Harp MD FACP,FACC,FAHA, [facility] 03/31/2018 11:58 AM

## 2018-04-02 ENCOUNTER — Telehealth: Payer: Self-pay | Admitting: Cardiovascular Disease

## 2018-04-02 MED ORDER — WARFARIN SODIUM 5 MG PO TABS: 5.0000 mg | ORAL_TABLET | ORAL | 0 refills | Status: DC

## 2018-04-02 NOTE — Telephone Encounter (Signed)
Telephoned pt and she states she asked Dr Gwenlyn Found on 9/18 during the OV for refill and it wasn't sent. Thus, Rx sent.

## 2018-04-02 NOTE — Telephone Encounter (Signed)
New Message:   Patient is requesting a return call    Pt c/o medication issue:  1. Name of Medication: warfarin (COUMADIN) 5 MG tablet  2. How are you currently taking this medication (dosage and times per day)? Take 5 mg by mouth daily.  3. Are you having a reaction (difficulty breathing--STAT)? No   4. What is your medication issue? Patient stated her medication was never called in to the pharmacy and now she is out of the medication    [facility] [address], [place]

## 2018-04-06 LAB — CUP PACEART REMOTE DEVICE CHECK
Implantable Pulse Generator Implant Date: 20170724
MDC IDC SESS DTM: 20190904123934

## 2018-04-19 ENCOUNTER — Encounter: Payer: Self-pay | Admitting: Cardiology

## 2018-04-19 ENCOUNTER — Ambulatory Visit (INDEPENDENT_AMBULATORY_CARE_PROVIDER_SITE_OTHER): Payer: PPO | Admitting: *Deleted

## 2018-04-19 DIAGNOSIS — I480 Paroxysmal atrial fibrillation: Secondary | ICD-10-CM

## 2018-04-20 ENCOUNTER — Encounter: Payer: Self-pay | Admitting: Cardiology

## 2018-04-20 NOTE — Progress Notes (Signed)
Carelink Summary Report / Loop Recorder 

## 2018-04-21 ENCOUNTER — Ambulatory Visit (INDEPENDENT_AMBULATORY_CARE_PROVIDER_SITE_OTHER): Payer: PPO | Admitting: *Deleted

## 2018-04-21 ENCOUNTER — Encounter: Payer: Self-pay | Admitting: Cardiology

## 2018-04-21 ENCOUNTER — Ambulatory Visit (INDEPENDENT_AMBULATORY_CARE_PROVIDER_SITE_OTHER): Payer: PPO | Admitting: Cardiology

## 2018-04-21 ENCOUNTER — Other Ambulatory Visit: Payer: Self-pay | Admitting: *Deleted

## 2018-04-21 VITALS — BP 124/72 | HR 65 | Ht 66.0 in

## 2018-04-21 DIAGNOSIS — Z79899 Other long term (current) drug therapy: Secondary | ICD-10-CM | POA: Diagnosis not present

## 2018-04-21 DIAGNOSIS — I4891 Unspecified atrial fibrillation: Secondary | ICD-10-CM | POA: Diagnosis not present

## 2018-04-21 DIAGNOSIS — I5032 Chronic diastolic (congestive) heart failure: Secondary | ICD-10-CM | POA: Diagnosis not present

## 2018-04-21 DIAGNOSIS — I4819 Other persistent atrial fibrillation: Secondary | ICD-10-CM

## 2018-04-21 DIAGNOSIS — Z7901 Long term (current) use of anticoagulants: Secondary | ICD-10-CM

## 2018-04-21 LAB — POCT INR: INR: 3.1 — AB (ref 2.0–3.0)

## 2018-04-21 MED ORDER — FUROSEMIDE 80 MG PO TABS: 80.0000 mg | ORAL_TABLET | Freq: Every day | ORAL | 0 refills | Status: DC

## 2018-04-21 MED ORDER — WARFARIN SODIUM 5 MG PO TABS: ORAL_TABLET | ORAL | 0 refills | Status: DC

## 2018-04-21 MED ORDER — AMIODARONE HCL 200 MG PO TABS: 200.0000 mg | ORAL_TABLET | Freq: Every day | ORAL | 1 refills | Status: DC

## 2018-04-21 MED ORDER — FUROSEMIDE 80 MG PO TABS: 80.0000 mg | ORAL_TABLET | Freq: Every day | ORAL | 1 refills | Status: DC

## 2018-04-21 MED ORDER — POTASSIUM CHLORIDE ER 20 MEQ PO TBCR: 20.0000 meq | EXTENDED_RELEASE_TABLET | Freq: Every day | ORAL | 1 refills | Status: DC

## 2018-04-21 NOTE — Patient Instructions (Addendum)
Description   Skip tomorrow's dose, then Continue on same dosage 5mg  daily.  Recheck in 2-3 weeks.  Call us with any medication changes # [phone].

## 2018-04-21 NOTE — Addendum Note (Signed)
Addended by: Stanton Kidney on: 04/21/2018 05:14 PM   Modules accepted: Orders

## 2018-04-21 NOTE — Patient Instructions (Signed)
Medication Instructions:  Your physician has recommended you make the following change in your medication: 1. INCREASE Lasix to 80 twice daily for 1 week, then return to normal dosing of 40 mg twice daily  If you need a refill on your cardiac medications before your next appointment, please call your pharmacy.   Lab work: Your physician recommends that you return for lab work in: 1 week for BMET If you have labs (blood work) drawn today and your tests are completely normal, you will receive your results only by: Marland Kitchen MyChart Message (if you have MyChart) OR . A paper copy in the mail If you have any lab test that is abnormal or we need to change your treatment, we will call you to review the results.  Testing/Procedures: None ordered  Follow-Up: At [facility], you and your health needs are our priority.  As part of our continuing mission to provide you with exceptional heart care, we have created designated Provider Care Teams.  These Care Teams include your primary Cardiologist (physician) and Advanced Practice Providers (APPs -  Physician Assistants and Nurse Practitioners) who all work together to provide you with the care you need, when you need it. You will need a follow up appointment in 6 months.  Please call our office 2 months in advance to schedule this appointment.  You may see Dr. Curt Bears or one of the following Advanced Practice Providers on your designated Care Team:   Chanetta Marshall, NP . Tommye Standard, PA-C .  Thank you for choosing [facility]!!   Trinidad Curet, RN [phone]

## 2018-04-21 NOTE — Progress Notes (Signed)
Electrophysiology Office Note   Date:  04/21/2018   ID:  [name], DOB [birth year], MRN [id number]  PCP:  Lujean Amel, MD Primary Electrophysiologist:  Constance Haw, MD    No chief complaint on file.    History of Present Illness: Hannah [name] is a 72 y.o. female who is being seen today for the evaluation of CHF, AF at the request of Koirala, Dibas, MD. Presenting today for electrophysiology evaluation.  She presented to the hospital on 09/13/16 complaining of productive cough with yellow sputum, shortness of breath, and palpitations. She was found to be in decompensated heart failure versus pneumonia. She was treated with antibiotics and Lasix. Does have a history of atrial fibrillation and is on warfarin and amiodarone.  Today, denies symptoms of palpitations, chest pain, shortness of breath, orthopnea, PND, lower extremity edema, claudication, dizziness, presyncope, syncope, bleeding, or neurologic sequela. The patient is tolerating medications without difficulties.  She has been having increasing lower extremity edema.  Her lower extremity edema has been occurring for many months.  She is starting to have skin changes.  She also gets short of breath when she exerts herself.  She has not taken her Lasix in the last 5 days that she has been out of the house and has not been near a restroom.   Past Medical History:  Diagnosis Date  . Atrial fibrillation ([place])   . Diabetes mellitus without complication ([place])   . Glaucoma    BILATERAL  . Hypertension   . Thyroid disease    Past Surgical History:  Procedure Laterality Date  . APPENDECTOMY    . CHOLECYSTECTOMY    . HERNIA REPAIR     UMBILICAL  . LASIK     BILATERAL     Current Outpatient Medications  Medication Sig Dispense Refill  . amiodarone (PACERONE) 200 MG tablet Take 1 tablet (200 mg total) by mouth daily. 90 tablet 1  . busPIRone (BUSPAR) 15 MG tablet TAKE 1 TABLET THREE TIMES A DAY 270  tablet 1  . DULoxetine (CYMBALTA) 30 MG capsule Take 3 capsules (90 mg total) by mouth daily. 270 capsule 1  . furosemide (LASIX) 80 MG tablet Take 80 mg by mouth daily.    Marland Kitchen levocetirizine (XYZAL) 5 MG tablet Take 5 mg by mouth every evening.    Marland Kitchen levothyroxine (SYNTHROID, LEVOTHROID) 75 MCG tablet Take 75 mcg by mouth daily.    . metFORMIN (GLUCOPHAGE) 500 MG tablet Take 500 mg by mouth daily with breakfast.    . montelukast (SINGULAIR) 10 MG tablet Take 10 mg by mouth every evening.    Marland Kitchen NYAMYC powder as directed.    . Potassium Chloride ER 20 MEQ TBCR Take 20 mEq by mouth daily. 90 tablet 1  . rosuvastatin (CRESTOR) 5 MG tablet Take 5 mg by mouth daily.    . traZODone (DESYREL) 100 MG tablet Take 2 tablets (200 mg total) by mouth at bedtime. 180 tablet 1  . warfarin (COUMADIN) 5 MG tablet Take 1 tablet (5 mg total) by mouth as directed. 35 tablet 0   No current facility-administered medications for this visit.     Allergies:   Penicillins   Social History:  The patient  reports that she has never smoked. She has never used smokeless tobacco. She reports that she drinks about 1.0 standard drinks of alcohol per week. She reports that she does not use drugs.   Family History:  The patient's family history includes Colon cancer in her father;  Heart attack in her brother and paternal grandfather; Heart disease in her brother; Lung cancer in her maternal grandfather and mother.   ROS:  Please see the history of present illness.   Otherwise, review of systems is positive for weight gain, leg swelling, shortness of breath, cough, snoring, anxiety, depression, walking problems, rash, dizziness, easy bruising, headaches.   All other systems are reviewed and negative.   PHYSICAL EXAM: VS:  BP 124/72   Pulse 65   Ht 5\' 6"  (1.676 m)   SpO2 96%   BMI 48.87 kg/m  , BMI Body mass index is 48.87 kg/m. GEN: Well nourished, well developed, in no acute distress  HEENT: normal  Neck: no JVD, carotid  bruits, or masses Cardiac: RRR; no murmurs, rubs, or gallops,no edema  Respiratory:  clear to auscultation bilaterally, normal work of breathing GI: soft, nontender, nondistended, + BS MS: no deformity or atrophy  Skin: warm and dry, device site well healed Neuro:  Strength and sensation are intact Psych: euthymic mood, full affect  EKG:  EKG is not ordered today. Personal review of the ekg ordered 01/12/18 shows SR, RBBB  Personal review of the device interrogation today. Results in [place]: 01/12/2018: ALT 10; BUN 27; Creatinine, Ser 1.31; NT-Pro BNP 151; Potassium 4.0; Sodium 140; TSH 12.070    Lipid Panel  No results found for: CHOL, TRIG, HDL, CHOLHDL, VLDL, LDLCALC, LDLDIRECT   Wt Readings from Last 3 Encounters:  01/12/18 (!) 302 lb 12.8 oz (137.3 kg)  06/02/17 281 lb (127.5 kg)  11/10/16 275 lb 12.8 oz (125.1 kg)      Other studies Reviewed: Additional studies/ records that were reviewed today include: TTE 01/18/18 Review of the above records today demonstrates:  - Left ventricle: The cavity size was mildly dilated. Systolic   function was normal. The estimated ejection fraction was in the   range of 60% to 65%. Wall motion was normal; there were no   regional wall motion abnormalities. Left ventricular diastolic   function parameters were normal. - Left atrium: The atrium was severely dilated. - Atrial septum: There was increased thickness of the septum,   consistent with lipomatous hypertrophy.   ASSESSMENT AND PLAN:  1.  Persistent atrial fibrillation: Currently on amiodarone and warfarin.  Maintaining sinus rhythm.  This patients CHA2DS2-VASc Score and unadjusted Ischemic Stroke Rate (% per year) is equal to 4.8 % stroke rate/year from a score of 4  Above score calculated as 1 point each if present [CHF, HTN, DM, Vascular=MI/PAD/Aortic Plaque, Age if 65-74, or Female] Above score calculated as 2 points each if present [Age > 75, or  Stroke/TIA/TE]   2. Chronic diastolic heart failure: She continues to have significant lower extremity edema associated with shortness of breath.  I have asked her to increase her Lasix dose to 80 mg twice a day for 1 week.  She Zakye Baby return in 1 week for a basic metabolic.  If she continues to have lower extremity edema, Idrissa Beville continue her twice daily Lasix.  Current medicines are reviewed at length with the patient today.   The patient does not have concerns regarding her medicines.  The following changes were made today: Increase Lasix  Labs/ tests ordered today include:  No orders of the defined types were placed in this encounter.    Disposition:   FU with [facility] 6 months  Signed, Amorie Rentz Meredith Leeds, MD  04/21/2018 4:00 PM     [facility]  [address] [place] [address] [phone] (office) [phone] (fax)

## 2018-04-27 DIAGNOSIS — I890 Lymphedema, not elsewhere classified: Secondary | ICD-10-CM | POA: Diagnosis not present

## 2018-04-28 ENCOUNTER — Other Ambulatory Visit: Payer: Self-pay | Admitting: Internal Medicine

## 2018-04-30 ENCOUNTER — Other Ambulatory Visit: Payer: Self-pay

## 2018-05-03 ENCOUNTER — Ambulatory Visit: Payer: PPO | Admitting: Podiatry

## 2018-05-03 LAB — CUP PACEART REMOTE DEVICE CHECK
Date Time Interrogation Session: 20191007144039
MDC IDC PG IMPLANT DT: 20170724

## 2018-05-06 ENCOUNTER — Telehealth: Payer: Self-pay

## 2018-05-06 NOTE — Telephone Encounter (Signed)
LMOVM requesting that pt send manual transmission b/c home monitor has not updated in at least 14 days.    

## 2018-05-10 ENCOUNTER — Other Ambulatory Visit: Payer: Self-pay

## 2018-05-24 ENCOUNTER — Ambulatory Visit (INDEPENDENT_AMBULATORY_CARE_PROVIDER_SITE_OTHER): Payer: PPO | Admitting: *Deleted

## 2018-05-24 DIAGNOSIS — I5030 Unspecified diastolic (congestive) heart failure: Secondary | ICD-10-CM

## 2018-05-24 DIAGNOSIS — I4891 Unspecified atrial fibrillation: Secondary | ICD-10-CM

## 2018-05-24 NOTE — Progress Notes (Signed)
Carelink Summary Report / Loop Recorder 

## 2018-06-10 ENCOUNTER — Other Ambulatory Visit: Payer: Self-pay | Admitting: Cardiology

## 2018-06-14 NOTE — Telephone Encounter (Signed)
Order Providers   Prescribing Provider Encounter Provider  Constance Haw, MD Constance Haw, MD  Outpatient Medication Detail    Disp Refills Start End   furosemide (LASIX) 80 MG tablet 7 tablet 0 04/21/2018 07/20/2018   Sig - Route: Take 1 tablet (80 mg total) by mouth daily. Take 1 tablet every evening for 1 week. Continue taking your regular 80 mg tablet in the morning - Oral   Sent to pharmacy as: furosemide (LASIX) 80 MG tablet   E-Prescribing Status: Receipt confirmed by pharmacy (04/21/2018 4:08 PM EDT)   [place], [place]

## 2018-06-14 NOTE — Telephone Encounter (Signed)
Order Providers   Prescribing Provider Encounter Provider  Constance Haw, MD Constance Haw, MD  Outpatient Medication Detail    Disp Refills Start End   furosemide (LASIX) 80 MG tablet 90 tablet 1 04/21/2018    Sig - Route: Take 1 tablet (80 mg total) by mouth daily. - Oral   Sent to pharmacy as: furosemide (LASIX) 80 MG tablet   E-Prescribing Status: Receipt confirmed by pharmacy (04/21/2018 4:06 PM EDT)   Pharmacy   [facility], [place]

## 2018-06-24 ENCOUNTER — Ambulatory Visit (INDEPENDENT_AMBULATORY_CARE_PROVIDER_SITE_OTHER): Payer: PPO

## 2018-06-24 DIAGNOSIS — I509 Heart failure, unspecified: Secondary | ICD-10-CM

## 2018-06-24 DIAGNOSIS — I4891 Unspecified atrial fibrillation: Secondary | ICD-10-CM

## 2018-06-25 NOTE — Progress Notes (Signed)
Carelink Summary Report / Loop Recorder 

## 2018-07-10 LAB — CUP PACEART REMOTE DEVICE CHECK
Date Time Interrogation Session: 20191109204010
Implantable Pulse Generator Implant Date: 20170724

## 2018-07-17 IMAGING — DX DG CHEST 1V PORT
1 series · 1 of 1 positions shown · non-contrast
Comparison: None.

CLINICAL DATA: Shortness of breath, cough.

EXAM:
PORTABLE CHEST 1 VIEW

[chest ap]
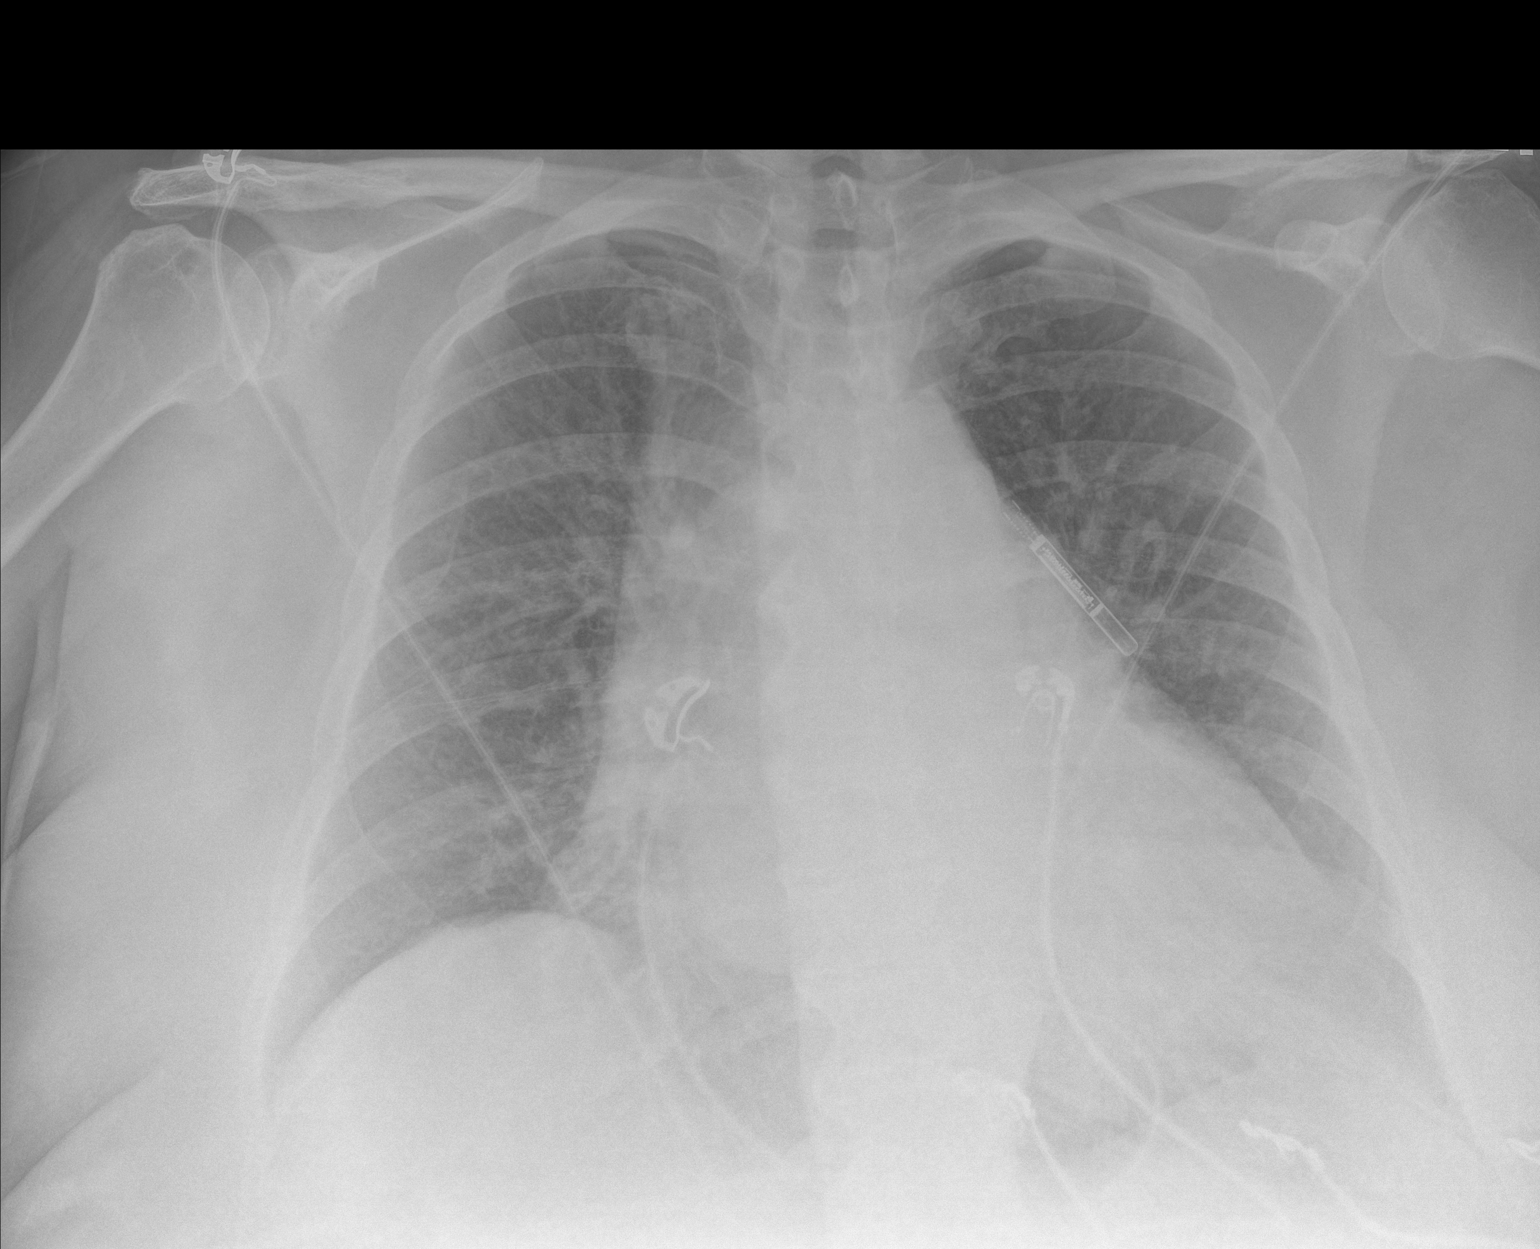

[1 of 1 positions shown; findings below may reference images not displayed]

FINDINGS: Mild cardiomegaly is noted. No pneumothorax or pleural effusion is
noted. Both lungs are clear. The visualized skeletal structures are
unremarkable.
IMPRESSION: No acute cardiopulmonary abnormality seen.

## 2018-07-27 ENCOUNTER — Ambulatory Visit (INDEPENDENT_AMBULATORY_CARE_PROVIDER_SITE_OTHER): Payer: PPO

## 2018-07-27 DIAGNOSIS — I4891 Unspecified atrial fibrillation: Secondary | ICD-10-CM

## 2018-07-28 NOTE — Progress Notes (Signed)
Carelink Summary Report / Loop Recorder 

## 2018-07-30 LAB — CUP PACEART REMOTE DEVICE CHECK
Date Time Interrogation Session: 20200114214132
Implantable Pulse Generator Implant Date: 20170724

## 2018-08-05 LAB — CUP PACEART REMOTE DEVICE CHECK
Implantable Pulse Generator Implant Date: 20170724
MDC IDC SESS DTM: 20191212213932

## 2018-08-27 ENCOUNTER — Telehealth: Payer: Self-pay

## 2018-08-27 NOTE — Telephone Encounter (Signed)
Called pt to let them know that they are overdue for there coumadin appt and the number is now invalid

## 2018-08-30 ENCOUNTER — Ambulatory Visit (INDEPENDENT_AMBULATORY_CARE_PROVIDER_SITE_OTHER): Payer: PPO

## 2018-08-30 DIAGNOSIS — I5032 Chronic diastolic (congestive) heart failure: Secondary | ICD-10-CM

## 2018-08-30 DIAGNOSIS — I4891 Unspecified atrial fibrillation: Secondary | ICD-10-CM

## 2018-08-30 LAB — CUP PACEART REMOTE DEVICE CHECK
Date Time Interrogation Session: 20200216224027
MDC IDC PG IMPLANT DT: 20170724

## 2018-09-08 ENCOUNTER — Telehealth: Payer: Self-pay | Admitting: Cardiology

## 2018-09-08 NOTE — Progress Notes (Signed)
Carelink Summary Report / Loop Recorder 

## 2018-09-08 NOTE — Telephone Encounter (Signed)
Patient daughter called and stated that patient is under a lot of stress. Pt is having more difficulty breathing while walking. Patient is fine at rest. Instructed pt daughter to send a manual transmission w/ pt home monitor. After several unsuccessful attempts I informed pt daughter that I would call her back at 3 that the reader to the monitor needs to charge.

## 2018-09-08 NOTE — Telephone Encounter (Signed)
Spoke w/ pt daughter and attempted to help them send remote transmission. Patient monitor is still indicating error code 3248. I instructed pt daughter to call tech support but she refuses b/c she never has any luck w/ tech support. Patient has a coumadin appointment on 09-15-2018 and would like to know if patient could have her ILR checked at that appointment. I informed pt daughter that is not an option. She would like an appt w/ and EP MD or APP to have ILR checked. Informed pt daughter that I would send a message to [employer] stay connected to have them call her in 1-2 business days to help her trouble shoot her home monitor. Routed call to Triage for further evaluation of shortness of breath.
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
## 2018-09-13 ENCOUNTER — Telehealth: Payer: Self-pay

## 2018-09-13 NOTE — Telephone Encounter (Signed)
Called to let pt know that they are extremely overdue for a coumadin check and that if a refill is requested, it will not be granted until the pt is seen in the office. Left a detailed msg.

## 2018-09-16 ENCOUNTER — Ambulatory Visit (INDEPENDENT_AMBULATORY_CARE_PROVIDER_SITE_OTHER): Payer: PPO | Admitting: Pharmacist

## 2018-09-16 DIAGNOSIS — I480 Paroxysmal atrial fibrillation: Secondary | ICD-10-CM | POA: Diagnosis not present

## 2018-09-16 DIAGNOSIS — Z7901 Long term (current) use of anticoagulants: Secondary | ICD-10-CM | POA: Diagnosis not present

## 2018-09-16 LAB — POCT INR: INR: 2.9 (ref 2.0–3.0)

## 2018-09-16 MED ORDER — WARFARIN SODIUM 5 MG PO TABS: ORAL_TABLET | ORAL | 1 refills | Status: DC

## 2018-09-16 NOTE — Patient Instructions (Signed)
Description   Continue on same dosage 5mg  daily.  Recheck in 4 weeks.  Call us with any medication changes # [phone].

## 2018-10-01 ENCOUNTER — Other Ambulatory Visit: Payer: Self-pay

## 2018-10-01 ENCOUNTER — Ambulatory Visit (INDEPENDENT_AMBULATORY_CARE_PROVIDER_SITE_OTHER): Payer: PPO | Admitting: *Deleted

## 2018-10-01 DIAGNOSIS — I480 Paroxysmal atrial fibrillation: Secondary | ICD-10-CM | POA: Diagnosis not present

## 2018-10-02 LAB — CUP PACEART REMOTE DEVICE CHECK
Date Time Interrogation Session: 20200320233914
MDC IDC PG IMPLANT DT: 20170724

## 2018-10-08 NOTE — Progress Notes (Signed)
Carelink Summary Report / Loop Recorder 

## 2018-10-27 ENCOUNTER — Telehealth: Payer: Self-pay

## 2018-10-27 NOTE — Telephone Encounter (Signed)
lmom for prescreen  

## 2018-10-28 ENCOUNTER — Other Ambulatory Visit: Payer: Self-pay

## 2018-10-28 ENCOUNTER — Ambulatory Visit (INDEPENDENT_AMBULATORY_CARE_PROVIDER_SITE_OTHER): Payer: PPO | Admitting: Pharmacist

## 2018-10-28 DIAGNOSIS — J9601 Acute respiratory failure with hypoxia: Secondary | ICD-10-CM | POA: Diagnosis not present

## 2018-10-28 DIAGNOSIS — Z7901 Long term (current) use of anticoagulants: Secondary | ICD-10-CM | POA: Diagnosis not present

## 2018-10-28 LAB — POCT INR: INR: 4.6 — AB (ref 2.0–3.0)

## 2018-11-03 ENCOUNTER — Ambulatory Visit (INDEPENDENT_AMBULATORY_CARE_PROVIDER_SITE_OTHER): Payer: PPO | Admitting: *Deleted

## 2018-11-03 ENCOUNTER — Other Ambulatory Visit: Payer: Self-pay

## 2018-11-03 DIAGNOSIS — I480 Paroxysmal atrial fibrillation: Secondary | ICD-10-CM | POA: Diagnosis not present

## 2018-11-04 LAB — CUP PACEART REMOTE DEVICE CHECK
Date Time Interrogation Session: 20200423003805
Implantable Pulse Generator Implant Date: 20170724

## 2018-11-11 NOTE — Progress Notes (Signed)
Carelink Summary Report / Loop Recorder 

## 2018-11-23 ENCOUNTER — Telehealth: Payer: Self-pay | Admitting: Cardiology

## 2018-11-23 NOTE — Telephone Encounter (Signed)
Returned call to pt and LMOM.

## 2018-11-23 NOTE — Telephone Encounter (Signed)
Patient called back. Patient requesting to wait in the lobby of the first floor for [facility] to pick her up. Advised that it was ok to wait in lobby of first floor. Encouraged patient to wear a mask.  Advised patient to ask driver if they would wait since appointment only takes a few min. She said she called and they said no. Patient plans to keep her appointment next thrusday

## 2018-11-23 NOTE — Telephone Encounter (Signed)
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 Is this possible? If not patient would like a call back to give her another option.

## 2018-12-01 ENCOUNTER — Telehealth: Payer: Self-pay

## 2018-12-01 NOTE — Telephone Encounter (Signed)

## 2018-12-02 ENCOUNTER — Ambulatory Visit (INDEPENDENT_AMBULATORY_CARE_PROVIDER_SITE_OTHER): Payer: PPO | Admitting: Pharmacist Clinician (PhC)/ Clinical Pharmacy Specialist

## 2018-12-02 DIAGNOSIS — Z7901 Long term (current) use of anticoagulants: Secondary | ICD-10-CM

## 2018-12-02 DIAGNOSIS — I4891 Unspecified atrial fibrillation: Secondary | ICD-10-CM

## 2018-12-02 LAB — POCT INR: INR: 6.3 — AB (ref 2.0–3.0)

## 2018-12-03 ENCOUNTER — Telehealth: Payer: Self-pay

## 2018-12-03 ENCOUNTER — Other Ambulatory Visit: Payer: Self-pay

## 2018-12-03 DIAGNOSIS — F321 Major depressive disorder, single episode, moderate: Secondary | ICD-10-CM | POA: Diagnosis not present

## 2018-12-03 DIAGNOSIS — Z7984 Long term (current) use of oral hypoglycemic drugs: Secondary | ICD-10-CM | POA: Diagnosis not present

## 2018-12-03 DIAGNOSIS — I5032 Chronic diastolic (congestive) heart failure: Secondary | ICD-10-CM | POA: Diagnosis not present

## 2018-12-03 DIAGNOSIS — E119 Type 2 diabetes mellitus without complications: Secondary | ICD-10-CM | POA: Diagnosis not present

## 2018-12-03 DIAGNOSIS — E039 Hypothyroidism, unspecified: Secondary | ICD-10-CM | POA: Diagnosis not present

## 2018-12-03 DIAGNOSIS — F5101 Primary insomnia: Secondary | ICD-10-CM | POA: Diagnosis not present

## 2018-12-03 NOTE — Telephone Encounter (Signed)
Spoke with patient and daughter, patient has had increased shortness of breath, LE edema, and weight gain for last few weeks.  Weight today is 330 pounds - last weight in office was 302 pounds. She has not been weighing herself daily.  She has been watching her sodium intake. Advised to double Lasix and Potassium for next 3 days. Follow up in office on Tuesday.  ER precautions given. I do think she is symptomatic enough to warrant admission but she and her daughter decline at this time.  Chanetta Marshall, NP 12/03/2018 2:33 PM

## 2018-12-03 NOTE — Telephone Encounter (Signed)
Pt daughter sent a transmission. The pt daughter thinks the pt may be in A-fib. The pt been out of breathe a lot. The daughter would like for someone to look at the transmission and give a call back as soon as possible. The best number to contact the pt would be [phone].

## 2018-12-07 ENCOUNTER — Ambulatory Visit (INDEPENDENT_AMBULATORY_CARE_PROVIDER_SITE_OTHER): Payer: PPO | Admitting: *Deleted

## 2018-12-07 ENCOUNTER — Encounter: Payer: Self-pay | Admitting: Nurse Practitioner

## 2018-12-07 ENCOUNTER — Telehealth: Payer: Self-pay | Admitting: Cardiology

## 2018-12-07 ENCOUNTER — Ambulatory Visit (INDEPENDENT_AMBULATORY_CARE_PROVIDER_SITE_OTHER): Payer: PPO | Admitting: Pharmacist

## 2018-12-07 ENCOUNTER — Ambulatory Visit (INDEPENDENT_AMBULATORY_CARE_PROVIDER_SITE_OTHER): Payer: PPO | Admitting: Nurse Practitioner

## 2018-12-07 ENCOUNTER — Encounter: Payer: Self-pay | Admitting: Student

## 2018-12-07 ENCOUNTER — Other Ambulatory Visit: Payer: Self-pay

## 2018-12-07 VITALS — BP 130/90 | HR 70 | Wt 324.0 lb

## 2018-12-07 DIAGNOSIS — R0602 Shortness of breath: Secondary | ICD-10-CM

## 2018-12-07 DIAGNOSIS — I4891 Unspecified atrial fibrillation: Secondary | ICD-10-CM

## 2018-12-07 DIAGNOSIS — Z79899 Other long term (current) drug therapy: Secondary | ICD-10-CM

## 2018-12-07 DIAGNOSIS — I480 Paroxysmal atrial fibrillation: Secondary | ICD-10-CM

## 2018-12-07 DIAGNOSIS — I5032 Chronic diastolic (congestive) heart failure: Secondary | ICD-10-CM | POA: Diagnosis not present

## 2018-12-07 DIAGNOSIS — J9600 Acute respiratory failure, unspecified whether with hypoxia or hypercapnia: Secondary | ICD-10-CM | POA: Diagnosis not present

## 2018-12-07 DIAGNOSIS — I872 Venous insufficiency (chronic) (peripheral): Secondary | ICD-10-CM

## 2018-12-07 LAB — POCT INR: INR: 1.6 — AB (ref 2.0–3.0)

## 2018-12-07 MED ORDER — TORSEMIDE 20 MG PO TABS: 60.0000 mg | ORAL_TABLET | Freq: Every day | ORAL | 3 refills | Status: DC

## 2018-12-07 MED ORDER — POTASSIUM CHLORIDE ER 20 MEQ PO TBCR: 20.0000 meq | EXTENDED_RELEASE_TABLET | Freq: Every day | ORAL | 1 refills | Status: DC

## 2018-12-07 MED ORDER — METOLAZONE 2.5 MG PO TABS: 2.5000 mg | ORAL_TABLET | ORAL | 0 refills | Status: DC | PRN

## 2018-12-07 NOTE — Patient Instructions (Signed)
  STOP furosemide/lasix after Thursday, 12/09/2018.   On Friday 12/10/2018, start torsemide 60 mg twice daily x 3 days, then change to 60 mg daily for chronic dosing.   On Friday, 12/10/2018, Take ONE tablet of metolazone 2.5 mg, 30 minutes prior to your torsemide.   Please take an extra 20 meq of potassium any time you take extra diuretic.  Please take an ADDITIONAL 40 meq of potassium anytime you take a metolazone.     For patients with heart failure, we recommend that you limit your fluid intake to less than 2000 mL (64 oz) a day and your sodium intake to less than 2000 mg a day. Please weigh daily.

## 2018-12-07 NOTE — Telephone Encounter (Signed)
New Message   Pt c/o medication issue:  1. Name of Medication: Metoloazone and Torsemide  2. How are you currently taking this medication (dosage and times per day)?   3. Are you having a reaction (difficulty breathing--STAT)? NO  4. What is your medication issue?  Pharmacy calling to get clarification on the dosage for the medication.

## 2018-12-07 NOTE — Progress Notes (Signed)
Electrophysiology Office Note   Date:  12/07/2018   ID:  Hannah [name], DOB 08/23/1945, MRN [id number]  PCP:  Lujean Amel, MD Primary Electrophysiologist:  Shirley Friar, PA-C    No chief complaint on file.    History of Present Illness: Hannah [name] is a 73 y.o. female who is being seen today for the evaluation of CHF, AF at the request of Koirala, Dibas, MD. Presenting today for electrophysiology evaluation.  She presented to the hospital on 09/13/16 complaining of productive cough with yellow sputum, shortness of breath, and palpitations. She was found to be in decompensated heart failure versus pneumonia. She was treated with antibiotics and Lasix. Does have a history of atrial fibrillation and is on warfarin and amiodarone.  She presents today as an add on for worsening SOB and fatigue. Her and daughter states it has worsened over the past month. She has eaten less fresh food and vegetables since the "stay at home" orders. She does not add salt, and tries to watch her sodium intake. She had chronic LE edema, that has worsened over the same time period. She does not weigh regularly, but is up approx 20-30 lbs based on her previous clinic weights. She is SOB walking around the house or getting dressed. She sleeps in a recliner chronically. She carries a diagnosis of OSA, but cannot tolerate a mask. UOP has not changed drastically with increased lasix.    Past Medical History:  Diagnosis Date  . Atrial fibrillation ([place])   . Diabetes mellitus without complication ([place])   . Glaucoma    BILATERAL  . Hypertension   . Thyroid disease    Past Surgical History:  Procedure Laterality Date  . APPENDECTOMY    . CHOLECYSTECTOMY    . HERNIA REPAIR     UMBILICAL  . LASIK     BILATERAL     Current Outpatient Medications  Medication Sig Dispense Refill  . amiodarone (PACERONE) 200 MG tablet Take 1 tablet (200 mg total) by mouth daily. 90 tablet 1  . busPIRone  (BUSPAR) 15 MG tablet TAKE 1 TABLET THREE TIMES A DAY 270 tablet 1  . DULoxetine (CYMBALTA) 30 MG capsule Take 3 capsules (90 mg total) by mouth daily. 270 capsule 1  . furosemide (LASIX) 80 MG tablet Take 1 tablet (80 mg total) by mouth daily. 90 tablet 1  . furosemide (LASIX) 80 MG tablet Take 1 tablet (80 mg total) by mouth daily. Take 1 tablet every evening for 1 week. Continue taking your regular 80 mg tablet in the morning 7 tablet 0  . levocetirizine (XYZAL) 5 MG tablet Take 5 mg by mouth every evening.    Marland Kitchen levothyroxine (SYNTHROID, LEVOTHROID) 75 MCG tablet Take 75 mcg by mouth daily.    . metFORMIN (GLUCOPHAGE) 500 MG tablet Take 500 mg by mouth daily with breakfast.    . montelukast (SINGULAIR) 10 MG tablet Take 10 mg by mouth every evening.    Marland Kitchen NYAMYC powder as directed.    . Potassium Chloride ER 20 MEQ TBCR Take 20 mEq by mouth daily. 90 tablet 1  . rosuvastatin (CRESTOR) 5 MG tablet Take 5 mg by mouth daily.    . traZODone (DESYREL) 100 MG tablet Take 2 tablets (200 mg total) by mouth at bedtime. 180 tablet 1  . warfarin (COUMADIN) 5 MG tablet Take 1 tablet by mouth daily or as directed by Coumadin clinic 35 tablet 1   No current facility-administered medications for this visit.  Allergies:   Penicillins   Social History:  The patient  reports that she has never smoked. She has never used smokeless tobacco. She reports current alcohol use of about 1.0 standard drinks of alcohol per week. She reports that she does not use drugs.   Family History:  The patient's family history includes Colon cancer in her father; Heart attack in her brother and paternal grandfather; Heart disease in her brother; Lung cancer in her maternal grandfather and mother.   ROS: Review of systems complete and found to be negative unless listed in HPI.  She denies bruising or bleeding with her recent elevated INR.    PHYSICAL EXAM: VS:  BP 130/90   Pulse 70   Wt (!) 324 lb (147 kg)   BMI 52.29  kg/m  , BMI Body mass index is 52.29 kg/m.  General: Obese, NAD.  HEENT: Normal Neck: Supple. JVP difficult due to body habitus, but appears elevated. Carotids 2+ bilat; no bruits. No thyromegaly or nodule noted. Cor: PMI non-palpable. RRR, No M/G/R noted Lungs: CTAB, normal effort. Abdomen: Obese, mild pitting edema inferiorly. No HSM appreciated. No bruits or masses. +BS  Extremities: 3+ pitting edema into thighs. + Chronic venous stasis changes. No obvious skin breakdown.   Neuro: Alert & orientedx3, cranial nerves grossly intact. moves all 4 extremities w/o difficulty. Affect pleasant   EKG:  EKG is not ordered today. LINQ report personally reviewed from 12/06/2018 with no recent AF. Regular on exam today.  Recent Labs: 01/12/2018: ALT 10; BUN 27; Creatinine, Ser 1.31; NT-Pro BNP 151; Potassium 4.0; Sodium 140; TSH 12.070    Lipid Panel  No results found for: CHOL, TRIG, HDL, CHOLHDL, VLDL, LDLCALC, LDLDIRECT   Wt Readings from Last 3 Encounters:  12/07/18 (!) 324 lb (147 kg)  01/12/18 (!) 302 lb 12.8 oz (137.3 kg)  06/02/17 281 lb (127.5 kg)      Other studies Reviewed: Additional studies/ records that were reviewed today include: TTE 01/18/18 Review of the above records today demonstrates:  - Left ventricle: The cavity size was mildly dilated. Systolic   function was normal. The estimated ejection fraction was in the   range of 60% to 65%. Wall motion was normal; there were no   regional wall motion abnormalities. Left ventricular diastolic   function parameters were normal. - Left atrium: The atrium was severely dilated. - Atrial septum: There was increased thickness of the septum,   consistent with lipomatous hypertrophy.   ASSESSMENT AND PLAN:  1.  Persistent atrial fibrillation: Currently on amiodarone and warfarin.  She is maintaining sinus rhythm per her LINQ monitor, personally reviewed.   This patients CHA2DS2-VASc Score and unadjusted Ischemic Stroke Rate (%  per year) is equal to 4.8 % stroke rate/year from a score of 4  Above score calculated as 1 point each if present [CHF, HTN, DM, Vascular=MI/PAD/Aortic Plaque, Age if 65-74, or Female] Above score calculated as 2 points each if present [Age > 75, or Stroke/TIA/TE]   2. Acute on chronic diastolic heart failure:  She has marked volume overload associated with worsening fatigue and SOB. She opted for outpatient management at this time.  - STOP lasix.  - Start torsemide 60 mg BID x 3 days, then 60 mg daily.  - Start metolazone 2.5 mg AS NEEDED. Take only as directed, with extra 40 meq of potassium - Continue potassium 20 meq daily, with extra 20 meq anytime she takes BID diuretics.  - RTC 2 weeks, sooner with symptoms.  Current medicines are reviewed at length with the patient today.   The patient does not have concerns regarding her medicines.  The following changes were made today: Changed to torsemide. Added prn metolazone, and adjusted potassium.   Labs/ tests ordered today include:  Orders Placed This Encounter  Procedures  . Basic Metabolic Panel (BMET)  . POCT INR     Disposition:  FU 2 weeks to re-assess. Will refer to HF clinic.   Jacalyn Lefevre, PA-C  12/07/2018 3:40 PM     [place] [address] [phone] (office) [phone] (fax)

## 2018-12-08 LAB — BASIC METABOLIC PANEL
BUN/Creatinine Ratio: 26 (ref 12–28)
BUN: 31 mg/dL — ABNORMAL HIGH (ref 8–27)
CO2: 26 mmol/L (ref 20–29)
Calcium: 9.5 mg/dL (ref 8.7–10.3)
Chloride: 98 mmol/L (ref 96–106)
Creatinine, Ser: 1.18 mg/dL — ABNORMAL HIGH (ref 0.57–1.00)
GFR calc Af Amer: 54 mL/min/{1.73_m2} — ABNORMAL LOW (ref >59)
GFR calc non Af Amer: 47 mL/min/{1.73_m2} — ABNORMAL LOW (ref >59)
Glucose: 89 mg/dL (ref 65–99)
Potassium: 4.3 mmol/L (ref 3.5–5.2)
Sodium: 140 mmol/L (ref 134–144)

## 2018-12-08 LAB — CUP PACEART REMOTE DEVICE CHECK
Date Time Interrogation Session: 20200526003701
Implantable Pulse Generator Implant Date: 20170724

## 2018-12-09 ENCOUNTER — Telehealth: Payer: Self-pay | Admitting: Pharmacist

## 2018-12-09 NOTE — Telephone Encounter (Signed)

## 2018-12-10 ENCOUNTER — Other Ambulatory Visit: Payer: Self-pay | Admitting: Student

## 2018-12-10 DIAGNOSIS — I5032 Chronic diastolic (congestive) heart failure: Secondary | ICD-10-CM

## 2018-12-13 ENCOUNTER — Other Ambulatory Visit: Payer: Self-pay

## 2018-12-13 ENCOUNTER — Ambulatory Visit (INDEPENDENT_AMBULATORY_CARE_PROVIDER_SITE_OTHER): Payer: PPO | Admitting: Pharmacist

## 2018-12-13 ENCOUNTER — Telehealth: Payer: Self-pay

## 2018-12-13 DIAGNOSIS — Z7901 Long term (current) use of anticoagulants: Secondary | ICD-10-CM | POA: Diagnosis not present

## 2018-12-13 LAB — POCT INR: INR: 2.1 (ref 2.0–3.0)

## 2018-12-13 NOTE — Telephone Encounter (Signed)
Discussed with patients daughter. Weight much improved. She had questions about taking extra torsemide and when to take. Weight back down to 305. Instructed to take additional torsemide as needed for weights 310 lbs or greater.   All questions answered.    They plan to keep follow up for next week. MyChart information sent.    Legrand Como [address] [place], PA-C 12/13/2018 1:53 PM

## 2018-12-13 NOTE — Telephone Encounter (Signed)
The pt daughter has questions about the medication Jonni Sanger advised the pt to take. I told her I will have Jonni Sanger call her back. The best number to reached the pt [phone]. The pt daughter states she likes that Jonni Sanger was very thorough. He took his time. She also states she like the fact that [facility] prescribe a stronger Lasik and asked is that a new pill that came out. I told her Jonni Sanger will be able to answer her questions.

## 2018-12-13 NOTE — Patient Instructions (Signed)
Continue with 1 tablet daily for now. Recheck INR in 4 weeks

## 2018-12-15 DIAGNOSIS — L03119 Cellulitis of unspecified part of limb: Secondary | ICD-10-CM | POA: Diagnosis not present

## 2018-12-15 DIAGNOSIS — I5032 Chronic diastolic (congestive) heart failure: Secondary | ICD-10-CM | POA: Diagnosis not present

## 2018-12-15 DIAGNOSIS — M1710 Unilateral primary osteoarthritis, unspecified knee: Secondary | ICD-10-CM | POA: Diagnosis not present

## 2018-12-15 NOTE — Progress Notes (Signed)
Carelink Summary Report / Loop Recorder 

## 2018-12-17 ENCOUNTER — Telehealth: Payer: Self-pay | Admitting: Cardiology

## 2018-12-17 NOTE — Telephone Encounter (Signed)
Patient daughter called and stated that since she started the new diuretic. Pt weight went down to 305 but today it is 312.4. Pt sleeps in a chair, some shortness of breath (pt didn't have ShOB at 305 but it is back now at 312.) legs are swollen. Pt hasn't missed a dose. Request a call back from [place]. A good number to call back is [phone]. Informed pt daughter that I would send a note to RN and providers and someone will call her back. Pt daughter verbalized understanding.

## 2018-12-17 NOTE — Telephone Encounter (Signed)
Pt is taking Torsemide 120 mg (60 mg BID) for last 2 days and weight has continued to increase. Will await provider advisement and call pt back w/ recommendation/s. Dtr agreeable to plan

## 2018-12-17 NOTE — Telephone Encounter (Signed)
Advised to continue Torsemide 120 mg BID for 2 more days. She will call Monday if no improvement made over weekend.

## 2018-12-18 ENCOUNTER — Other Ambulatory Visit: Payer: Self-pay | Admitting: Cardiology

## 2018-12-19 NOTE — Telephone Encounter (Signed)
LMOM. Instructed patient to report to ED if weight continued to trend up, and to go back to normal dose of torsemide (60 mg daily) if weight has corrected. Left number for main heartcare clinic/answering service.

## 2018-12-20 NOTE — Telephone Encounter (Signed)
Spoke with daughter and pt. Weight up to 313 this am.   She felt great at 305 lbs last week.   Her UOP is stable, denies lightheadedness or dizziness. No dark urine.     Pt will take metolazone 2.5 mg tomorrow am with her am dose of torsemide and keep her appointment on 12/22/2018. Will need labwork that day.    Legrand Como [address] [place], PA-C 12/20/2018 2:51 PM

## 2018-12-21 ENCOUNTER — Telehealth: Payer: Self-pay

## 2018-12-21 ENCOUNTER — Telehealth: Payer: Self-pay | Admitting: Nurse Practitioner

## 2018-12-21 NOTE — Telephone Encounter (Signed)
Left message regarding appt on 12/22/18.

## 2018-12-21 NOTE — Telephone Encounter (Signed)
Spoke with pt regarding covid-19 screening. Pt stated she has no symptoms and has not been in contact with anyone who may have covid-19.

## 2018-12-21 NOTE — Telephone Encounter (Signed)
New Message     Pt is calling to speak with April    Please call back

## 2018-12-22 ENCOUNTER — Ambulatory Visit: Payer: PPO | Admitting: Nurse Practitioner

## 2018-12-27 ENCOUNTER — Encounter: Payer: Self-pay | Admitting: Nurse Practitioner

## 2018-12-27 ENCOUNTER — Other Ambulatory Visit: Payer: Self-pay

## 2018-12-27 ENCOUNTER — Ambulatory Visit (INDEPENDENT_AMBULATORY_CARE_PROVIDER_SITE_OTHER): Payer: PPO | Admitting: Student

## 2018-12-27 ENCOUNTER — Telehealth: Payer: Self-pay | Admitting: *Deleted

## 2018-12-27 VITALS — HR 71 | Ht 66.0 in | Wt 312.6 lb

## 2018-12-27 DIAGNOSIS — Z79899 Other long term (current) drug therapy: Secondary | ICD-10-CM | POA: Diagnosis not present

## 2018-12-27 DIAGNOSIS — I5043 Acute on chronic combined systolic (congestive) and diastolic (congestive) heart failure: Secondary | ICD-10-CM | POA: Diagnosis not present

## 2018-12-27 DIAGNOSIS — G4730 Sleep apnea, unspecified: Secondary | ICD-10-CM | POA: Diagnosis not present

## 2018-12-27 NOTE — Telephone Encounter (Signed)
-----   Message from Frederik Schmidt, RN sent at 12/27/2018 12:47 PM EDT ----- Regarding: Sleep Study Please Schedule Sleep Study Sleep Apnea Amber Seiler/Andy Tillery Thank you

## 2018-12-27 NOTE — Patient Instructions (Signed)
Medication Instructions:  none If you need a refill on your cardiac medications before your next appointment, please call your pharmacy.   Lab work:TODAY BMET If you have labs (blood work) drawn today and your tests are completely normal, you will receive your results only by: Marland Kitchen MyChart Message (if you have MyChart) OR . A paper copy in the mail If you have any lab test that is abnormal or we need to change your treatment, we will call you to review the results.  Testing/Procedures: Your physician has recommended that you have a sleep study. This test records several body functions during sleep, including: brain activity, eye movement, oxygen and carbon dioxide blood levels, heart rate and rhythm, breathing rate and rhythm, the flow of air through your mouth and nose, snoring, body muscle movements, and chest and belly movement.    Follow-Up: AS NEEDED  Referral for Sleep Study                    Please schedule CHF Clinic Appointment Dr Aundra Dubin  At [facility], you and your health needs are our priority.  As part of our continuing mission to provide you with exceptional heart care, we have created designated Provider Care Teams.  These Care Teams include your primary Cardiologist (physician) and Advanced Practice Providers (APPs -  Physician Assistants and Nurse Practitioners) who all work together to provide you with the care you need, when you need it. .   Any Other Special Instructions Will Be Listed Below (If Applicable).

## 2018-12-27 NOTE — Progress Notes (Signed)
Electrophysiology Office Note   Date:  12/27/2018   ID:  Hannah [name], DOB Sep 25, 1945, MRN [id number]  PCP:  Lujean Amel, MD Primary Electrophysiologist:  Shirley Friar, PA-C    CC: Chronic diastolic CHF  History of Present Illness: Hannah [name] is a 73 y.o. female who is being seen today for the evaluation of CHF, AF at the request of Koirala, Dibas, MD. Presenting today for electrophysiology evaluation.  She presented to the hospital on 09/13/16 complaining of productive cough with yellow sputum, shortness of breath, and palpitations. She was found to be in decompensated heart failure versus pneumonia. She was treated with antibiotics and Lasix. Does have a history of atrial fibrillation and is on warfarin and amiodarone.  She presents today for follow up after adjustment of her diuretics. Down 12 lbs. Feeling better. She is frustrated she can't get her weight lower.  She has longstanding OSA but is intolerant of a mask. She is willing to re-try if it will "help her heart". Counseled again on salt and fluid restriction. She states she will at times sit down and eat an entire bag of chips. She previously a laser surgery for chronic venous stasis. Does not use compression hose. She denies SOB with ADLs at this time. Sleeps in recliner chronically. Good UOP on torsemide.   Past Medical History:  Diagnosis Date  . Atrial fibrillation ([place])   . Diabetes mellitus without complication ([place])   . Glaucoma    BILATERAL  . Hypertension   . Thyroid disease    Past Surgical History:  Procedure Laterality Date  . APPENDECTOMY    . CHOLECYSTECTOMY    . HERNIA REPAIR     UMBILICAL  . LASIK     BILATERAL     Current Outpatient Medications  Medication Sig Dispense Refill  . amiodarone (PACERONE) 200 MG tablet Take 1 tablet (200 mg total) by mouth daily. 90 tablet 1  . busPIRone (BUSPAR) 15 MG tablet TAKE 1 TABLET THREE TIMES A DAY 270 tablet 1  . doxycycline  (VIBRAMYCIN) 100 MG capsule Use as directed    . DULoxetine (CYMBALTA) 30 MG capsule Take 3 capsules (90 mg total) by mouth daily. 270 capsule 1  . levocetirizine (XYZAL) 5 MG tablet Take 5 mg by mouth every evening.    Marland Kitchen levothyroxine (SYNTHROID, LEVOTHROID) 75 MCG tablet Take 75 mcg by mouth daily.    . metFORMIN (GLUCOPHAGE) 500 MG tablet Take 500 mg by mouth daily with breakfast.    . metolazone (ZAROXOLYN) 2.5 MG tablet Take 1 tablet (2.5 mg total) by mouth as needed. 5 tablet 0  . montelukast (SINGULAIR) 10 MG tablet Take 10 mg by mouth every evening.    Marland Kitchen NYAMYC powder as directed.    . Potassium Chloride ER 20 MEQ TBCR Take 20 mEq by mouth daily. Take extra 20 meq on days she takes extra diuretic. 90 tablet 1  . rosuvastatin (CRESTOR) 5 MG tablet Take 5 mg by mouth daily.    Marland Kitchen torsemide (DEMADEX) 20 MG tablet Take 3 tablets (60 mg total) by mouth daily. Take extra as needed or directed. 200 tablet 3  . traZODone (DESYREL) 100 MG tablet Take 2 tablets (200 mg total) by mouth at bedtime. 180 tablet 1  . warfarin (COUMADIN) 5 MG tablet TAKE ONE TABLET BY MOUTH AS DIRECTED 35 tablet 1   No current facility-administered medications for this visit.    Allergies:   Penicillins   Social History:  The patient  reports that she has never smoked. She has never used smokeless tobacco. She reports current alcohol use of about 1.0 standard drinks of alcohol per week. She reports that she does not use drugs.   Family History:  The patient's family history includes Colon cancer in her father; Heart attack in her brother and paternal grandfather; Heart disease in her brother; Lung cancer in her maternal grandfather and mother.   Review of systems complete and found to be negative unless listed in HPI.   Physical Exam: Vitals:   12/27/18 1155  Pulse: 71  SpO2: 96%  Weight: (!) 312 lb 9.6 oz (141.8 kg)  Height: 5\' 6"  (1.676 m)   GEN- The patient is well appearing, alert and oriented x 3 today.    Head- normocephalic, atraumatic Eyes-  Sclera clear, conjunctiva pink Ears- hearing intact Oropharynx- clear Neck- supple,  Lungs- Clear to ausculation bilaterally, normal work of breathing Chest- pacemaker pocket is without hematoma/ bruit Heart- Regular rate and rhythm, no murmurs, rubs or gallops, PMI not laterally displaced GI- soft, NT, ND, + BS Extremities- no clubbing or cyanosis. Trace to 1+ edema 3-4 to her knees (much improved from last visit)  Neuro- strength and sensation are intact  Pacemaker interrogation not performed today.   EKG:  EKG is not ordered today.  Recent Labs: 01/12/2018: ALT 10; NT-Pro BNP 151; TSH 12.070 12/07/2018: BUN 31; Creatinine, Ser 1.18; Potassium 4.3; Sodium 140    Lipid Panel  No results found for: CHOL, TRIG, HDL, CHOLHDL, VLDL, LDLCALC, LDLDIRECT   Wt Readings from Last 3 Encounters:  12/27/18 (!) 312 lb 9.6 oz (141.8 kg)  12/07/18 (!) 324 lb (147 kg)  01/12/18 (!) 302 lb 12.8 oz (137.3 kg)      Other studies Reviewed: Additional studies/ records that were reviewed today include: TTE 01/18/18 Review of the above records today demonstrates:  - Left ventricle: The cavity size was mildly dilated. Systolic   function was normal. The estimated ejection fraction was in the   range of 60% to 65%. Wall motion was normal; there were no   regional wall motion abnormalities. Left ventricular diastolic   function parameters were normal. - Left atrium: The atrium was severely dilated. - Atrial septum: There was increased thickness of the septum,   consistent with lipomatous hypertrophy.  ASSESSMENT AND PLAN:  1.  Persistent atrial fibrillation: Currently on amiodarone and warfarin.  She is maintaining sinus rhythm per her LINQ monitor, personally reviewed.   This patients CHA2DS2-VASc Score and unadjusted Ischemic Stroke Rate (% per year) is equal to 4.8 % stroke rate/year from a score of 4  Above score calculated as 1 point each if present  [CHF, HTN, DM, Vascular=MI/PAD/Aortic Plaque, Age if 65-74, or Female] Above score calculated as 2 points each if present [Age > 75, or Stroke/TIA/TE]   2. Chronic diastolic heart failure:  Improved with transition to torsemide. She has a component of chronic venous stasis as well. Encouraged stockings.   - Continue torsemide 60 mg daily.  - Continue metolazone 2.5 mg AS NEEDED only. Take only as directed, with extra 40 meq of potassium - Continue potassium 20 meq daily, with extra 20 meq anytime she takes BID diuretics.  - Referred to HF clinic.  3. OSA Previously diagnosed. Refused CPAP. She is willing to retry, so will order a repeat sleep study  Current medicines are reviewed at length with the patient today.   The patient does not have concerns regarding her medicines.  The following changes were made today: Changed to torsemide. Added prn metolazone, and adjusted potassium.   Labs/ tests ordered today include:  No orders of the defined types were placed in this encounter.  Disposition:  FU with EP prn. Have referred to HF clinic.   Jacalyn Lefevre, PA-C  12/27/2018 12:04 PM     [place] [address] [phone] (office) [phone] (fax)

## 2018-12-28 LAB — BASIC METABOLIC PANEL
BUN/Creatinine Ratio: 28 (ref 12–28)
BUN: 50 mg/dL — ABNORMAL HIGH (ref 8–27)
CO2: 26 mmol/L (ref 20–29)
Calcium: 9.1 mg/dL (ref 8.7–10.3)
Chloride: 97 mmol/L (ref 96–106)
Creatinine, Ser: 1.77 mg/dL — ABNORMAL HIGH (ref 0.57–1.00)
GFR calc Af Amer: 33 mL/min/{1.73_m2} — ABNORMAL LOW (ref >59)
GFR calc non Af Amer: 28 mL/min/{1.73_m2} — ABNORMAL LOW (ref >59)
Glucose: 84 mg/dL (ref 65–99)
Potassium: 3.7 mmol/L (ref 3.5–5.2)
Sodium: 142 mmol/L (ref 134–144)

## 2018-12-29 DIAGNOSIS — Z7901 Long term (current) use of anticoagulants: Secondary | ICD-10-CM | POA: Diagnosis not present

## 2018-12-31 ENCOUNTER — Other Ambulatory Visit: Payer: Self-pay | Admitting: Student

## 2018-12-31 DIAGNOSIS — I5032 Chronic diastolic (congestive) heart failure: Secondary | ICD-10-CM

## 2019-01-03 ENCOUNTER — Telehealth: Payer: Self-pay

## 2019-01-03 DIAGNOSIS — M1711 Unilateral primary osteoarthritis, right knee: Secondary | ICD-10-CM | POA: Diagnosis not present

## 2019-01-03 DIAGNOSIS — Z6841 Body Mass Index (BMI) 40.0 and over, adult: Secondary | ICD-10-CM | POA: Diagnosis not present

## 2019-01-03 DIAGNOSIS — M25561 Pain in right knee: Secondary | ICD-10-CM | POA: Diagnosis not present

## 2019-01-03 DIAGNOSIS — M1712 Unilateral primary osteoarthritis, left knee: Secondary | ICD-10-CM | POA: Diagnosis not present

## 2019-01-03 DIAGNOSIS — M170 Bilateral primary osteoarthritis of knee: Secondary | ICD-10-CM | POA: Diagnosis not present

## 2019-01-03 DIAGNOSIS — M25562 Pain in left knee: Secondary | ICD-10-CM | POA: Diagnosis not present

## 2019-01-03 NOTE — Telephone Encounter (Signed)
lmom for prescreen  

## 2019-01-04 NOTE — Telephone Encounter (Signed)
Sent to precert

## 2019-01-07 ENCOUNTER — Telehealth: Payer: Self-pay | Admitting: *Deleted

## 2019-01-07 ENCOUNTER — Other Ambulatory Visit: Payer: Self-pay | Admitting: Student

## 2019-01-07 DIAGNOSIS — G4733 Obstructive sleep apnea (adult) (pediatric): Secondary | ICD-10-CM

## 2019-01-07 NOTE — Telephone Encounter (Signed)
Staff message sent to [facility] HTA will not cover in lab study. Ok to schedule HST. No PA is required.

## 2019-01-07 NOTE — Telephone Encounter (Signed)
-----   Message from Freada Bergeron, [place] sent at 01/04/2019  6:53 PM EDT ----- Regarding: precert Split night

## 2019-01-10 ENCOUNTER — Other Ambulatory Visit: Payer: PPO

## 2019-01-10 ENCOUNTER — Ambulatory Visit (INDEPENDENT_AMBULATORY_CARE_PROVIDER_SITE_OTHER): Payer: PPO | Admitting: *Deleted

## 2019-01-10 DIAGNOSIS — I4891 Unspecified atrial fibrillation: Secondary | ICD-10-CM | POA: Diagnosis not present

## 2019-01-10 LAB — CUP PACEART REMOTE DEVICE CHECK
Date Time Interrogation Session: 20200628013947
Implantable Pulse Generator Implant Date: 20170724

## 2019-01-12 ENCOUNTER — Telehealth: Payer: Self-pay | Admitting: *Deleted

## 2019-01-12 ENCOUNTER — Other Ambulatory Visit: Payer: Self-pay | Admitting: Cardiology

## 2019-01-12 NOTE — Telephone Encounter (Signed)
-----   Message from Lauralee Evener, [place] sent at 01/07/2019  2:02 PM EDT ----- Regarding: RE: precert Patient's insurance will not cover in lab sleep study. Has to have HST. Please call to schedule. ----- Message ----- From: Freada Bergeron, CMA Sent: 01/04/2019   6:53 PM EDT To: Cv Div Sleep Studies Subject: precert                                        Split night

## 2019-01-12 NOTE — Telephone Encounter (Signed)
Pt missed Coumadin appt on 6/29, left message - need to reschedule appt before sending in refill.

## 2019-01-12 NOTE — Telephone Encounter (Signed)
Patient is aware and agreeable to Home Sleep Study through [facility]. Patient is scheduled for 02/25/19 at 11:30 to pick up home sleep kit and meet with Respiratory therapist at [facility]. Patient is aware that if this appointment date and time does not work for them they should contact Artis Delay directly at [phone]. Patient is aware that a sleep packet will be sent from [facility] in week. Patient is agreeable to treatment and thankful for call.

## 2019-01-18 NOTE — Progress Notes (Signed)
Carelink Summary Report / Loop Recorder 

## 2019-01-18 NOTE — Telephone Encounter (Signed)
Left message again.

## 2019-01-24 NOTE — Telephone Encounter (Signed)
Left message again.

## 2019-01-25 NOTE — Telephone Encounter (Signed)
Appt scheduled 7/16

## 2019-01-26 ENCOUNTER — Telehealth: Payer: Self-pay | Admitting: *Deleted

## 2019-01-26 NOTE — Telephone Encounter (Signed)
A message was left, re: follow up visit. 

## 2019-01-27 ENCOUNTER — Ambulatory Visit (INDEPENDENT_AMBULATORY_CARE_PROVIDER_SITE_OTHER): Payer: PPO | Admitting: *Deleted

## 2019-01-27 ENCOUNTER — Other Ambulatory Visit: Payer: Self-pay

## 2019-01-27 DIAGNOSIS — Z7901 Long term (current) use of anticoagulants: Secondary | ICD-10-CM

## 2019-01-27 DIAGNOSIS — I4891 Unspecified atrial fibrillation: Secondary | ICD-10-CM | POA: Diagnosis not present

## 2019-01-27 LAB — POCT INR: INR: 3.2 — AB (ref 2.0–3.0)

## 2019-01-27 NOTE — Patient Instructions (Signed)
Description   Do not take any Coumadin tomorrow then continue with 1 tablet daily. Recheck INR in 4 weeks.   Contact coumadin clinic with any questions.

## 2019-01-30 ENCOUNTER — Other Ambulatory Visit: Payer: Self-pay | Admitting: Cardiology

## 2019-02-10 ENCOUNTER — Ambulatory Visit (INDEPENDENT_AMBULATORY_CARE_PROVIDER_SITE_OTHER): Payer: PPO | Admitting: *Deleted

## 2019-02-10 DIAGNOSIS — I4891 Unspecified atrial fibrillation: Secondary | ICD-10-CM

## 2019-02-11 DIAGNOSIS — E039 Hypothyroidism, unspecified: Secondary | ICD-10-CM | POA: Diagnosis not present

## 2019-02-11 DIAGNOSIS — I482 Chronic atrial fibrillation, unspecified: Secondary | ICD-10-CM | POA: Diagnosis not present

## 2019-02-11 DIAGNOSIS — Z23 Encounter for immunization: Secondary | ICD-10-CM | POA: Diagnosis not present

## 2019-02-11 DIAGNOSIS — Z7984 Long term (current) use of oral hypoglycemic drugs: Secondary | ICD-10-CM | POA: Diagnosis not present

## 2019-02-11 DIAGNOSIS — E119 Type 2 diabetes mellitus without complications: Secondary | ICD-10-CM | POA: Diagnosis not present

## 2019-02-11 DIAGNOSIS — I872 Venous insufficiency (chronic) (peripheral): Secondary | ICD-10-CM | POA: Diagnosis not present

## 2019-02-11 DIAGNOSIS — Z1159 Encounter for screening for other viral diseases: Secondary | ICD-10-CM | POA: Diagnosis not present

## 2019-02-11 DIAGNOSIS — F321 Major depressive disorder, single episode, moderate: Secondary | ICD-10-CM | POA: Diagnosis not present

## 2019-02-11 DIAGNOSIS — Z79899 Other long term (current) drug therapy: Secondary | ICD-10-CM | POA: Diagnosis not present

## 2019-02-11 DIAGNOSIS — M25562 Pain in left knee: Secondary | ICD-10-CM | POA: Diagnosis not present

## 2019-02-11 DIAGNOSIS — Z0000 Encounter for general adult medical examination without abnormal findings: Secondary | ICD-10-CM | POA: Diagnosis not present

## 2019-02-11 DIAGNOSIS — I5032 Chronic diastolic (congestive) heart failure: Secondary | ICD-10-CM | POA: Diagnosis not present

## 2019-02-11 LAB — CUP PACEART REMOTE DEVICE CHECK
Date Time Interrogation Session: 20200731013622
Implantable Pulse Generator Implant Date: 20170724

## 2019-02-15 DIAGNOSIS — M1711 Unilateral primary osteoarthritis, right knee: Secondary | ICD-10-CM | POA: Diagnosis not present

## 2019-02-15 DIAGNOSIS — M1712 Unilateral primary osteoarthritis, left knee: Secondary | ICD-10-CM | POA: Diagnosis not present

## 2019-02-15 DIAGNOSIS — M25562 Pain in left knee: Secondary | ICD-10-CM | POA: Diagnosis not present

## 2019-02-17 ENCOUNTER — Encounter: Payer: Self-pay | Admitting: *Deleted

## 2019-02-18 DIAGNOSIS — M94262 Chondromalacia, left knee: Secondary | ICD-10-CM | POA: Diagnosis not present

## 2019-02-18 DIAGNOSIS — M23352 Other meniscus derangements, posterior horn of lateral meniscus, left knee: Secondary | ICD-10-CM | POA: Diagnosis not present

## 2019-02-18 DIAGNOSIS — M23322 Other meniscus derangements, posterior horn of medial meniscus, left knee: Secondary | ICD-10-CM | POA: Diagnosis not present

## 2019-02-18 DIAGNOSIS — S83282A Other tear of lateral meniscus, current injury, left knee, initial encounter: Secondary | ICD-10-CM | POA: Diagnosis not present

## 2019-02-18 DIAGNOSIS — S83242A Other tear of medial meniscus, current injury, left knee, initial encounter: Secondary | ICD-10-CM | POA: Diagnosis not present

## 2019-02-18 DIAGNOSIS — M25462 Effusion, left knee: Secondary | ICD-10-CM | POA: Diagnosis not present

## 2019-02-18 DIAGNOSIS — M2242 Chondromalacia patellae, left knee: Secondary | ICD-10-CM | POA: Diagnosis not present

## 2019-02-18 NOTE — Progress Notes (Signed)
Carelink Summary Report / Loop Recorder 

## 2019-02-21 ENCOUNTER — Other Ambulatory Visit: Payer: Self-pay | Admitting: Orthopedic Surgery

## 2019-02-21 ENCOUNTER — Other Ambulatory Visit: Payer: Self-pay | Admitting: Family Medicine

## 2019-02-21 DIAGNOSIS — M25562 Pain in left knee: Secondary | ICD-10-CM

## 2019-02-22 DIAGNOSIS — M25561 Pain in right knee: Secondary | ICD-10-CM | POA: Diagnosis not present

## 2019-02-22 DIAGNOSIS — M1712 Unilateral primary osteoarthritis, left knee: Secondary | ICD-10-CM | POA: Diagnosis not present

## 2019-02-22 DIAGNOSIS — Z6841 Body Mass Index (BMI) 40.0 and over, adult: Secondary | ICD-10-CM | POA: Diagnosis not present

## 2019-02-22 DIAGNOSIS — M25562 Pain in left knee: Secondary | ICD-10-CM | POA: Diagnosis not present

## 2019-02-23 ENCOUNTER — Telehealth: Payer: Self-pay | Admitting: Cardiology

## 2019-02-23 NOTE — Telephone Encounter (Signed)
Pt can still take a metolazone, as GFR can go up or down due to volume overload. If she is symptomatic she can go through ED for admission, as would likely need to be through the medicine team due to her co-morbidities, or can request next available gen cards app appointment as bridge to her HF referral.  Thanks!

## 2019-02-23 NOTE — Telephone Encounter (Signed)
Patient daughter called in and stated that patient is back to 326 LBS. She states that is is sufficient weight gain. She stated that pt does not weigh herself every day so she doesn't know what the trend is.

## 2019-02-23 NOTE — Telephone Encounter (Signed)
Pt should take torsemide 60 mg BID x 3 days, with one dose of 2.5 mg metolazone after her morning dose of torsemide 02/24/2019.   She should take 20 meq potassium BID for these three days, and also take an additional 40 meq of potassium 02/24/2019.    The patient should be familiar with as needed metolazone and sliding scale diuretics, but if not would look like this:   02/23/2019  Torsemide 60 mg BID   Potassium 20 meq BID 02/24/2019  Torsemide 60 mg BID   Potassium 20 meq BID + Metolazone 2.5 mg after her am torsemide with extra 40 meq of potassium 02/25/2019  Torsemide 60 mg BID  Potassium 20 meq BID 02/26/2019  Back to Torsemide 60 mg daily and potassium 20 meq daily as previously prescribed.   If she needs a refill of metolazone, we can give her #5, no refills.   Can we please check on her referral to the HF clinic?    Thank you!

## 2019-02-23 NOTE — Telephone Encounter (Signed)
Spoke with patient dtr who stated her mom is currently taking Torsemide 60 mg twice a day already. Patient dtr said new lab work stated her GFR was down to 31 now from PCP in 01/2019. Labs requested from  [facility] via voicemail after 3 calls no answer for records.   Pt dtr did not get the last recommendation from labs  to decrease Torsemide to 40 mg she stated or she might have forgotten  to tell her to do that. Also didn't get repeat BMET.  Patient has an  Orthopedic appointment  where they drew fluid out iof her knee and  she needed to hold her fluid pills for that . Then she continued to take 60 mg twice day . Patient stated she has to go back to another orthopedic appointment and have to hold her fluid pills tomorrow too.  Dtr wants to know does the  patient needs a direct admit to the hospital to remove fluid or should she do the Torsemide 40 mg once a day she never did. Patient has no c/o of chest pain or sob just about weight and getting fluid off.

## 2019-02-24 ENCOUNTER — Other Ambulatory Visit: Payer: Self-pay | Admitting: Cardiology

## 2019-02-25 ENCOUNTER — Other Ambulatory Visit: Payer: Self-pay

## 2019-02-25 ENCOUNTER — Encounter (HOSPITAL_BASED_OUTPATIENT_CLINIC_OR_DEPARTMENT_OTHER): Payer: PPO | Admitting: Neurology

## 2019-02-25 ENCOUNTER — Ambulatory Visit (INDEPENDENT_AMBULATORY_CARE_PROVIDER_SITE_OTHER): Payer: PPO | Admitting: Pharmacist

## 2019-02-25 ENCOUNTER — Other Ambulatory Visit: Payer: Self-pay | Admitting: Pharmacist

## 2019-02-25 DIAGNOSIS — Z7901 Long term (current) use of anticoagulants: Secondary | ICD-10-CM

## 2019-02-25 DIAGNOSIS — I4891 Unspecified atrial fibrillation: Secondary | ICD-10-CM | POA: Diagnosis not present

## 2019-02-25 DIAGNOSIS — I5032 Chronic diastolic (congestive) heart failure: Secondary | ICD-10-CM

## 2019-02-25 LAB — POCT INR: INR: 4.4 — AB (ref 2.0–3.0)

## 2019-02-25 MED ORDER — AMIODARONE HCL 200 MG PO TABS: 200.0000 mg | ORAL_TABLET | Freq: Every day | ORAL | 3 refills | Status: DC

## 2019-02-25 MED ORDER — WARFARIN SODIUM 5 MG PO TABS: ORAL_TABLET | ORAL | 0 refills | Status: DC

## 2019-02-25 NOTE — Telephone Encounter (Signed)
Pt's medication was sent to pt's pharmacy as requested. Confirmation received.  °

## 2019-02-25 NOTE — Telephone Encounter (Signed)
Spoke with patient dtr with recommendations to still take metolazone dose after morning am Torsemide  Dose with extra 40 meq potassium  and resume back to Torsemide 60 mg once a day and  20 meq potassium daily Sunday.

## 2019-02-25 NOTE — Telephone Encounter (Signed)
Patient requesting a 90 day refill on amiodarone

## 2019-02-25 NOTE — Patient Instructions (Signed)
Do not take any Coumadin tomorrow, take 1/2 tab on Sunday, then continue with 1 tablet daily. Recheck INR in 3 weeks.   Contact coumadin clinic with any questions.

## 2019-03-01 ENCOUNTER — Other Ambulatory Visit: Payer: Self-pay

## 2019-03-01 ENCOUNTER — Ambulatory Visit (INDEPENDENT_AMBULATORY_CARE_PROVIDER_SITE_OTHER): Payer: PPO | Admitting: Student

## 2019-03-01 VITALS — BP 134/76 | HR 80 | Ht 66.0 in | Wt 312.0 lb

## 2019-03-01 DIAGNOSIS — I480 Paroxysmal atrial fibrillation: Secondary | ICD-10-CM | POA: Diagnosis not present

## 2019-03-01 DIAGNOSIS — I5030 Unspecified diastolic (congestive) heart failure: Secondary | ICD-10-CM | POA: Diagnosis not present

## 2019-03-01 DIAGNOSIS — Z7901 Long term (current) use of anticoagulants: Secondary | ICD-10-CM | POA: Diagnosis not present

## 2019-03-01 DIAGNOSIS — I872 Venous insufficiency (chronic) (peripheral): Secondary | ICD-10-CM | POA: Diagnosis not present

## 2019-03-01 DIAGNOSIS — I5032 Chronic diastolic (congestive) heart failure: Secondary | ICD-10-CM | POA: Diagnosis not present

## 2019-03-01 LAB — CUP PACEART INCLINIC DEVICE CHECK
Date Time Interrogation Session: 20200818152822
Implantable Pulse Generator Implant Date: 20170724

## 2019-03-01 MED ORDER — METOLAZONE 2.5 MG PO TABS: 2.5000 mg | ORAL_TABLET | ORAL | 1 refills | Status: DC | PRN

## 2019-03-01 MED ORDER — POTASSIUM CHLORIDE ER 20 MEQ PO TBCR: 20.0000 meq | EXTENDED_RELEASE_TABLET | Freq: Every day | ORAL | 3 refills | Status: DC

## 2019-03-01 MED ORDER — TORSEMIDE 20 MG PO TABS: 60.0000 mg | ORAL_TABLET | Freq: Every day | ORAL | 3 refills | Status: DC

## 2019-03-01 NOTE — Progress Notes (Signed)
Electrophysiology Office Note Date: 03/01/2019  ID:  Hannah [name], DOB 06/01/1946, MRN [id number]  PCP: Hannah Amel, MD Primary Cardiologist: Hannah Meredith Leeds, MD Electrophysiologist: None  CC: Acute on chronic diastolic CHF.  Hannah [name] is a 73 y.o. female seen today for Dr. Curt Bears. She presents today for acute on chronic diastolic CHF. Since last being seen in our clinic, the patient she has had ups and downs. She fell and tore her left knee meniscus, and was told that her weight and arthritis in her knee prevented her from getting surgery, and that she would likely be worse off if she underwent surgery. Her weight has fluctuated and was up to ~326 after falling and hurting her knee. She has been taking 60 mg torsemide daily, never decreased to 40 mg.  Her weight is now down near her baseline after a metolazone and 3 days of torsemide BID. She denies chest pain, palpitations, dyspnea, PND, orthopnea, nausea, vomiting, dizziness, syncope, weight gain, or early satiety. She is scheduled for a home sleep study.   Past Medical History:  Diagnosis Date  . Atrial fibrillation ([place])   . Diabetes mellitus without complication ([place])   . Glaucoma    BILATERAL  . Hypertension   . Thyroid disease    Past Surgical History:  Procedure Laterality Date  . APPENDECTOMY    . CHOLECYSTECTOMY    . HERNIA REPAIR     UMBILICAL  . LASIK     BILATERAL    Current Outpatient Medications  Medication Sig Dispense Refill  . amiodarone (PACERONE) 200 MG tablet Take 1 tablet (200 mg total) by mouth daily. 90 tablet 3  . busPIRone (BUSPAR) 15 MG tablet TAKE 1 TABLET THREE TIMES A DAY 270 tablet 1  . doxycycline (VIBRAMYCIN) 100 MG capsule Use as directed    . DULoxetine (CYMBALTA) 30 MG capsule Take 3 capsules (90 mg total) by mouth daily. 270 capsule 1  . levocetirizine (XYZAL) 5 MG tablet Take 5 mg by mouth every evening.    Marland Kitchen levothyroxine (SYNTHROID, LEVOTHROID) 75 MCG tablet  Take 75 mcg by mouth daily.    . metFORMIN (GLUCOPHAGE) 500 MG tablet Take 500 mg by mouth daily with breakfast.    . montelukast (SINGULAIR) 10 MG tablet Take 10 mg by mouth every evening.    Marland Kitchen NYAMYC powder as directed.    . Potassium Chloride ER 20 MEQ TBCR Take 20 mEq by mouth daily. Take extra 20 meq on days she takes extra diuretic. 90 tablet 3  . rosuvastatin (CRESTOR) 5 MG tablet Take 5 mg by mouth daily.    . traZODone (DESYREL) 100 MG tablet Take 2 tablets (200 mg total) by mouth at bedtime. 180 tablet 1  . warfarin (COUMADIN) 5 MG tablet Take as directed by the Coumadin Clinic 100 tablet 0  . metolazone (ZAROXOLYN) 2.5 MG tablet Take 1 tablet (2.5 mg total) by mouth as needed. 10 tablet 1  . torsemide (DEMADEX) 20 MG tablet Take 3 tablets (60 mg total) by mouth daily. Take extra as needed or directed. 270 tablet 3   No current facility-administered medications for this visit.     Allergies:   Penicillins   Social History: Social History   Socioeconomic History  . Marital status: Legally Separated    Spouse name: Not on file  . Number of children: Not on file  . Years of education: Not on file  . Highest education level: Not on file  Occupational History  .  Not on file  Social Needs  . Financial resource strain: Not on file  . Food insecurity    Worry: Not on file    Inability: Not on file  . Transportation needs    Medical: Not on file    Non-medical: Not on file  Tobacco Use  . Smoking status: Never Smoker  . Smokeless tobacco: Never Used  Substance and Sexual Activity  . Alcohol use: Yes    Alcohol/week: 1.0 standard drinks    Types: 1 Glasses of wine per week    Comment: occ  . Drug use: No  . Sexual activity: Never  Lifestyle  . Physical activity    Days per week: Not on file    Minutes per session: Not on file  . Stress: Not on file  Relationships  . Social Herbalist on phone: Not on file    Gets together: Not on file    Attends  religious service: Not on file    Active member of club or organization: Not on file    Attends meetings of clubs or organizations: Not on file    Relationship status: Not on file  . Intimate partner violence    Fear of current or ex partner: Not on file    Emotionally abused: Not on file    Physically abused: Not on file    Forced sexual activity: Not on file  Other Topics Concern  . Not on file  Social History Narrative  . Not on file    Family History: Family History  Problem Relation Age of Onset  . Lung cancer Mother   . Colon cancer Father   . Heart attack Brother   . Heart disease Brother   . Lung cancer Maternal Grandfather   . Heart attack Paternal Grandfather      Review of Systems: All other systems reviewed and are otherwise negative except as noted above.  Physical Exam: Vitals:   03/01/19 1428  BP: 134/76  Pulse: 80  Weight: (!) 312 lb (141.5 kg)  Height: 5\' 6"  (1.676 m)     GEN- The patient is well appearing, alert and oriented x 3 today.   HEENT: normocephalic, atraumatic; sclera clear, conjunctiva pink; hearing intact; oropharynx clear; neck supple  Lungs- Clear to ausculation bilaterally, normal work of breathing.  No wheezes, rales, rhonchi Heart- Regular rate and rhythm, no murmurs, rubs or gallops  GI- soft, non-tender, non-distended, bowel sounds present  Extremities- no clubbing or cyanosis. 1+ bilateral ankle edema.   MS- no significant deformity or atrophy Skin- warm and dry, no rash or lesion; PPM pocket well healed Psych- euthymic mood, full affect Neuro- strength and sensation are intact  PPM Interrogation- reviewed in detail today,  See PACEART report  EKG:  EKG is ordered today. The ekg ordered today shows NSR at 80 bpm with 1st degree AV block, PR 264 ms  Recent Labs: 12/27/2018: BUN 50; Creatinine, Ser 1.77; Potassium 3.7; Sodium 142   Wt Readings from Last 3 Encounters:  03/01/19 (!) 312 lb (141.5 kg)  12/27/18 (!) 312 lb 9.6  oz (141.8 kg)  12/07/18 (!) 324 lb (147 kg)     Other studies Reviewed: Additional studies/ records that were reviewed today include: Previous office visits and labs.    Assessment and Plan:  1.  Paroxysmal Afib s/p LINQ recorder Overall burden < 0.1%. She had an episode earlier this month that lasted for ~1.5 hrs.  See Claudia Desanctis Art report  No changes today Continue coumadin, denies bleeding.   2. Acute on chronic diastolic CHF Weight much improved with increased torsemide Would continue torsemide 60 mg daily for now. BMET today.  She finds it hard to reconcile that her baseline weight is ~ 310, and while she does still have ankle edema, it is not significant, nor does it extend past her ankles. Further complicated by her chronic venous stasis. She had AKI when her weight got closer to 300 (Cr 1.18 -> 1.77) Counseled extensively today on sodium and dietary restrictions, weight loss (including the possibility of bariatric surgery which ortho has already recommended), and treating her probably sleep apnea.   3. Suspected OSA HST pending  4. Torn L medial meniscus Her weight and pre-existing arthritis (she would need a total knee replacement) prohibit surgery. Weight loss recommended by Ortho, but would need to lose nearly 100 lbs. She is struggling with this advice and realization.   Current medicines are reviewed at length with the patient today.   The patient does not have concerns regarding her medicines.  The following changes were made today:  none  Labs/ tests ordered today include:  Orders Placed This Encounter  Procedures  . Basic metabolic panel  . CUP PACEART [place]  . EKG 12-Lead   Disposition:   Follow up with EP annually. BMET today.  Have referred to CHF clinic to assist in diuretic management, once on steady regimen we would gladly continue to follow as needed.   Jacalyn Lefevre, PA-C  03/01/2019 3:44 PM  [place] [address] [phone] (office) [phone] (fax)

## 2019-03-01 NOTE — Patient Instructions (Addendum)
Medication Instructions:   Your physician recommends that you continue on your current medications as directed. Please refer to the Current Medication list given to you today.  If you need a refill on your cardiac medications before your next appointment, please call your pharmacy.   Lab work:  BMET TODAY    If you have labs (blood work) drawn today and your tests are completely normal, you will receive your results only by: Marland Kitchen MyChart Message (if you have MyChart) OR . A paper copy in the mail If you have any lab test that is abnormal or we need to change your treatment, we will call you to review the results.  Testing/Procedures: NONE ORDERED  TODAY   Follow-Up: Your physician wants you to follow-up in:  [place]  [place] will receive a reminder letter in the mail two months in advance. If you don't receive a letter, please call our office to schedule the follow-up appointment.  Any Other Special Instructions Will Be Listed Below (If Applicable).

## 2019-03-02 ENCOUNTER — Telehealth: Payer: Self-pay | Admitting: Student

## 2019-03-02 LAB — BASIC METABOLIC PANEL
BUN/Creatinine Ratio: 27 (ref 12–28)
BUN: 37 mg/dL — ABNORMAL HIGH (ref 8–27)
CO2: 29 mmol/L (ref 20–29)
Calcium: 9.6 mg/dL (ref 8.7–10.3)
Chloride: 95 mmol/L — ABNORMAL LOW (ref 96–106)
Creatinine, Ser: 1.37 mg/dL — ABNORMAL HIGH (ref 0.57–1.00)
GFR calc Af Amer: 45 mL/min/{1.73_m2} — ABNORMAL LOW (ref >59)
GFR calc non Af Amer: 39 mL/min/{1.73_m2} — ABNORMAL LOW (ref >59)
Glucose: 89 mg/dL (ref 65–99)
Potassium: 3.8 mmol/L (ref 3.5–5.2)
Sodium: 141 mmol/L (ref 134–144)

## 2019-03-02 NOTE — Telephone Encounter (Signed)
error 

## 2019-03-04 DIAGNOSIS — N183 Chronic kidney disease, stage 3 (moderate): Secondary | ICD-10-CM | POA: Diagnosis not present

## 2019-03-15 ENCOUNTER — Ambulatory Visit (INDEPENDENT_AMBULATORY_CARE_PROVIDER_SITE_OTHER): Payer: PPO | Admitting: *Deleted

## 2019-03-15 DIAGNOSIS — I480 Paroxysmal atrial fibrillation: Secondary | ICD-10-CM | POA: Diagnosis not present

## 2019-03-16 LAB — CUP PACEART REMOTE DEVICE CHECK
Date Time Interrogation Session: 20200902021005
Implantable Pulse Generator Implant Date: 20170724

## 2019-03-17 DIAGNOSIS — M1711 Unilateral primary osteoarthritis, right knee: Secondary | ICD-10-CM | POA: Diagnosis not present

## 2019-03-17 DIAGNOSIS — M1712 Unilateral primary osteoarthritis, left knee: Secondary | ICD-10-CM | POA: Diagnosis not present

## 2019-03-18 ENCOUNTER — Ambulatory Visit (INDEPENDENT_AMBULATORY_CARE_PROVIDER_SITE_OTHER): Payer: PPO | Admitting: *Deleted

## 2019-03-18 ENCOUNTER — Other Ambulatory Visit: Payer: Self-pay

## 2019-03-18 DIAGNOSIS — Z7901 Long term (current) use of anticoagulants: Secondary | ICD-10-CM

## 2019-03-18 DIAGNOSIS — I480 Paroxysmal atrial fibrillation: Secondary | ICD-10-CM

## 2019-03-18 LAB — POCT INR: INR: 2 (ref 2.0–3.0)

## 2019-03-18 NOTE — Patient Instructions (Addendum)
Description   Today take 1.5 tablets then continue taking 1 tablet daily. Recheck INR in 4 weeks. Contact [facility] Clinic with any questions or any new meds [phone].

## 2019-03-29 NOTE — Progress Notes (Signed)
Carelink Summary Report / Loop Recorder 

## 2019-04-13 ENCOUNTER — Other Ambulatory Visit: Payer: Self-pay | Admitting: Student

## 2019-04-13 DIAGNOSIS — I5032 Chronic diastolic (congestive) heart failure: Secondary | ICD-10-CM

## 2019-04-18 ENCOUNTER — Ambulatory Visit (INDEPENDENT_AMBULATORY_CARE_PROVIDER_SITE_OTHER): Payer: PPO | Admitting: *Deleted

## 2019-04-18 DIAGNOSIS — I5030 Unspecified diastolic (congestive) heart failure: Secondary | ICD-10-CM | POA: Diagnosis not present

## 2019-04-18 DIAGNOSIS — I480 Paroxysmal atrial fibrillation: Secondary | ICD-10-CM

## 2019-04-19 LAB — CUP PACEART REMOTE DEVICE CHECK
Date Time Interrogation Session: 20201005022840
Implantable Pulse Generator Implant Date: 20170724

## 2019-04-25 NOTE — Progress Notes (Signed)
Carelink Summary Report / Loop Recorder 

## 2019-05-20 ENCOUNTER — Ambulatory Visit (INDEPENDENT_AMBULATORY_CARE_PROVIDER_SITE_OTHER): Payer: PPO | Admitting: *Deleted

## 2019-05-20 DIAGNOSIS — I480 Paroxysmal atrial fibrillation: Secondary | ICD-10-CM

## 2019-05-21 LAB — CUP PACEART REMOTE DEVICE CHECK
Date Time Interrogation Session: 20201107022719
Implantable Pulse Generator Implant Date: 20170724

## 2019-05-23 ENCOUNTER — Telehealth: Payer: Self-pay | Admitting: Emergency Medicine

## 2019-05-23 NOTE — Telephone Encounter (Signed)
LMOM. Need to assess patient and need manual transmission.Alert for AT/AF burden of 56%. No EGMs. + Warfarin.

## 2019-05-24 NOTE — Telephone Encounter (Signed)
Transmission sent. Patient has increased SOB with activity, increased edema in BLE. Will refer to CHF clinic per [place]'s PA note from earlier visit.

## 2019-05-26 ENCOUNTER — Telehealth (HOSPITAL_COMMUNITY): Payer: Self-pay | Admitting: Physician Assistant

## 2019-05-26 NOTE — Telephone Encounter (Signed)
Called patient and left message to call [facility] to schedule appt per Jodi Geralds at [facility].

## 2019-06-01 ENCOUNTER — Telehealth: Payer: Self-pay

## 2019-06-01 NOTE — Telephone Encounter (Signed)
lmom for overdue inr 

## 2019-06-03 NOTE — Telephone Encounter (Signed)
Patient has not returned my call.  Called and left 2nd message for patient to contact [facility] to schedule appt per deivce clinic @[address]

## 2019-06-05 NOTE — Progress Notes (Signed)
Carelink Summary Report / Loop Recorder 

## 2019-06-06 ENCOUNTER — Ambulatory Visit (INDEPENDENT_AMBULATORY_CARE_PROVIDER_SITE_OTHER): Payer: PPO | Admitting: *Deleted

## 2019-06-06 ENCOUNTER — Other Ambulatory Visit: Payer: Self-pay

## 2019-06-06 DIAGNOSIS — I480 Paroxysmal atrial fibrillation: Secondary | ICD-10-CM

## 2019-06-06 DIAGNOSIS — Z7901 Long term (current) use of anticoagulants: Secondary | ICD-10-CM | POA: Diagnosis not present

## 2019-06-06 DIAGNOSIS — Z5181 Encounter for therapeutic drug level monitoring: Secondary | ICD-10-CM

## 2019-06-06 LAB — PROTIME-INR
INR: 5.8 (ref 0.9–1.2)
Prothrombin Time: 61.8 s — ABNORMAL HIGH (ref 9.1–12.0)

## 2019-06-06 LAB — POCT INR: INR: 6.3 — AB (ref 2.0–3.0)

## 2019-06-06 NOTE — Patient Instructions (Signed)
Description   Today take 1.5 tablets then continue taking 1 tablet daily. Recheck INR in 4 weeks. Contact Coumadin Clinic with any questions or any new meds [phone].

## 2019-06-06 NOTE — Progress Notes (Signed)
INR 6.3 pt sent to lab. Pt instructed to not take any coumadin until she is given further dosing instructions. Verified that we had the correct phone number on file so that we would be able to get in touch with her.  Pt instructed to seek medical attention if she has any bruising, bleeding or fall. Made aware that she is at a higher risk for bleeding.   Lab called stating the pt's INR was 5.8, attempted to call pt and give her updated dosing instructions, LMOM.

## 2019-06-20 ENCOUNTER — Encounter: Payer: Self-pay | Admitting: Student

## 2019-06-21 ENCOUNTER — Other Ambulatory Visit: Payer: Self-pay | Admitting: Student

## 2019-06-21 DIAGNOSIS — I5032 Chronic diastolic (congestive) heart failure: Secondary | ICD-10-CM

## 2019-06-21 NOTE — Telephone Encounter (Signed)
Pt's pharmacy is requesting a refill on metolazone. Would Barrington Ellison, PA like to refill this medication? Please address

## 2019-06-21 NOTE — Telephone Encounter (Signed)
Yes, We need a BMET unless she has any more recent labwork than August.  Thank you!

## 2019-06-22 ENCOUNTER — Ambulatory Visit (INDEPENDENT_AMBULATORY_CARE_PROVIDER_SITE_OTHER): Payer: PPO | Admitting: *Deleted

## 2019-06-22 ENCOUNTER — Telehealth: Payer: Self-pay

## 2019-06-22 DIAGNOSIS — I480 Paroxysmal atrial fibrillation: Secondary | ICD-10-CM | POA: Diagnosis not present

## 2019-06-22 NOTE — Telephone Encounter (Signed)
lpmtcb 1/29 

## 2019-06-23 ENCOUNTER — Telehealth: Payer: Self-pay

## 2019-06-23 ENCOUNTER — Other Ambulatory Visit: Payer: Self-pay

## 2019-06-23 DIAGNOSIS — Z79899 Other long term (current) drug therapy: Secondary | ICD-10-CM

## 2019-06-23 DIAGNOSIS — I5032 Chronic diastolic (congestive) heart failure: Secondary | ICD-10-CM

## 2019-06-23 LAB — CUP PACEART REMOTE DEVICE CHECK
Date Time Interrogation Session: 20201209212748
Implantable Pulse Generator Implant Date: 20170724

## 2019-06-23 MED ORDER — METOLAZONE 2.5 MG PO TABS: ORAL_TABLET | ORAL | 0 refills | Status: DC

## 2019-06-23 NOTE — Telephone Encounter (Signed)
lpmtcb 12/10

## 2019-06-23 NOTE — Telephone Encounter (Signed)
I spoke to the patient with Andy's recommendations and she verbalized understanding.  I refilled Metolazone 2.5 mg PRN and scheduled her for BMET on 12/16 (Wednesday).

## 2019-06-28 ENCOUNTER — Telehealth (HOSPITAL_COMMUNITY): Payer: Self-pay | Admitting: Vascular Surgery

## 2019-06-28 NOTE — Telephone Encounter (Signed)
Left pt message giving NEW CHF appt w/ [facility] 07/26/19, asked pt to call back to confirm appt, New pt packet will be sent to pt , w/ appt time and directions

## 2019-06-29 ENCOUNTER — Telehealth: Payer: Self-pay | Admitting: *Deleted

## 2019-06-29 NOTE — Telephone Encounter (Signed)
lmtcb

## 2019-06-29 NOTE — Telephone Encounter (Signed)
-----   Message from Will Meredith Leeds, MD sent at 06/23/2019 11:28 AM EST ----- Abnormal LINQ reviewed. Notable for AF burden increased. Needs clinic visit to discuss.

## 2019-07-01 NOTE — Telephone Encounter (Signed)
I spoke to the patient and she would like a call to set up appointment with Dr Curt Bears for 12/21 or 12/23 please to discuss LINQ recording.

## 2019-07-01 NOTE — Telephone Encounter (Signed)
Follow up    Please return call to patient to discuss reason appointment is needed. Patient declined to schedule without detailed clinical reason

## 2019-07-05 ENCOUNTER — Other Ambulatory Visit: Payer: Self-pay

## 2019-07-05 ENCOUNTER — Other Ambulatory Visit: Payer: PPO | Admitting: *Deleted

## 2019-07-05 ENCOUNTER — Ambulatory Visit (INDEPENDENT_AMBULATORY_CARE_PROVIDER_SITE_OTHER): Payer: PPO | Admitting: *Deleted

## 2019-07-05 DIAGNOSIS — I480 Paroxysmal atrial fibrillation: Secondary | ICD-10-CM | POA: Diagnosis not present

## 2019-07-05 DIAGNOSIS — Z79899 Other long term (current) drug therapy: Secondary | ICD-10-CM

## 2019-07-05 DIAGNOSIS — Z7901 Long term (current) use of anticoagulants: Secondary | ICD-10-CM | POA: Diagnosis not present

## 2019-07-05 LAB — POCT INR: INR: 4.7 — AB (ref 2.0–3.0)

## 2019-07-05 NOTE — Patient Instructions (Signed)
Description   Do not take any Warfarin tomorrow and No Warfarin Thursday then start taking 1 tablet daily except 1/2 tablet on Sundays. Recheck INR in 3 weeks with MD Appt. Contact Coumadin Clinic with any questions or any new meds [phone].

## 2019-07-06 LAB — BASIC METABOLIC PANEL
BUN/Creatinine Ratio: 20 (ref 12–28)
BUN: 32 mg/dL — ABNORMAL HIGH (ref 8–27)
CO2: 28 mmol/L (ref 20–29)
Calcium: 9.3 mg/dL (ref 8.7–10.3)
Chloride: 96 mmol/L (ref 96–106)
Creatinine, Ser: 1.61 mg/dL — ABNORMAL HIGH (ref 0.57–1.00)
GFR calc Af Amer: 37 mL/min/{1.73_m2} — ABNORMAL LOW (ref >59)
GFR calc non Af Amer: 32 mL/min/{1.73_m2} — ABNORMAL LOW (ref >59)
Glucose: 87 mg/dL (ref 65–99)
Potassium: 4 mmol/L (ref 3.5–5.2)
Sodium: 143 mmol/L (ref 134–144)

## 2019-07-25 ENCOUNTER — Ambulatory Visit (INDEPENDENT_AMBULATORY_CARE_PROVIDER_SITE_OTHER): Payer: PPO | Admitting: *Deleted

## 2019-07-25 DIAGNOSIS — I480 Paroxysmal atrial fibrillation: Secondary | ICD-10-CM | POA: Diagnosis not present

## 2019-07-26 ENCOUNTER — Encounter (HOSPITAL_COMMUNITY): Payer: PPO | Admitting: Cardiology

## 2019-07-26 LAB — CUP PACEART REMOTE DEVICE CHECK
Date Time Interrogation Session: 20210111213907
Implantable Pulse Generator Implant Date: 20170724

## 2019-07-28 ENCOUNTER — Telehealth: Payer: Self-pay | Admitting: Cardiology

## 2019-07-28 NOTE — Telephone Encounter (Signed)
[place] for daughter to come to Coumadin appt, note added to appt.  Will let Dr Curt Bears weigh in on his visit so that pt's daughter just needs to be called once.

## 2019-07-28 NOTE — Telephone Encounter (Signed)
Patient is calling stating that she is requesting to have her daughter, Tilda Burrow, accompany her during both of her appointments on 07/29/19 at 3:15 and 3:45 PM. Patient states that she has issues with memory.   Please call patient's daughter at [phone]

## 2019-07-29 ENCOUNTER — Encounter: Payer: PPO | Admitting: Cardiology

## 2019-07-29 NOTE — Telephone Encounter (Signed)
Left detailed message informing dtr ok to accompany mom to appt this afternoon.

## 2019-08-09 ENCOUNTER — Encounter (HOSPITAL_COMMUNITY): Payer: PPO | Admitting: Cardiology

## 2019-08-11 ENCOUNTER — Other Ambulatory Visit: Payer: Self-pay | Admitting: Student

## 2019-08-11 DIAGNOSIS — I5032 Chronic diastolic (congestive) heart failure: Secondary | ICD-10-CM

## 2019-08-15 DIAGNOSIS — F321 Major depressive disorder, single episode, moderate: Secondary | ICD-10-CM | POA: Diagnosis not present

## 2019-08-15 DIAGNOSIS — E119 Type 2 diabetes mellitus without complications: Secondary | ICD-10-CM | POA: Diagnosis not present

## 2019-08-15 DIAGNOSIS — I872 Venous insufficiency (chronic) (peripheral): Secondary | ICD-10-CM | POA: Diagnosis not present

## 2019-08-15 DIAGNOSIS — E039 Hypothyroidism, unspecified: Secondary | ICD-10-CM | POA: Diagnosis not present

## 2019-08-15 DIAGNOSIS — E7800 Pure hypercholesterolemia, unspecified: Secondary | ICD-10-CM | POA: Diagnosis not present

## 2019-08-15 DIAGNOSIS — N1832 Chronic kidney disease, stage 3b: Secondary | ICD-10-CM | POA: Diagnosis not present

## 2019-08-15 DIAGNOSIS — Z7984 Long term (current) use of oral hypoglycemic drugs: Secondary | ICD-10-CM | POA: Diagnosis not present

## 2019-08-15 MED ORDER — METOLAZONE 2.5 MG PO TABS: ORAL_TABLET | ORAL | 7 refills | Status: DC

## 2019-08-15 NOTE — Addendum Note (Signed)
Addended by: Derl Barrow on: 08/15/2019 12:15 PM   Modules accepted: Orders

## 2019-08-22 ENCOUNTER — Encounter: Payer: PPO | Admitting: Cardiology

## 2019-08-25 ENCOUNTER — Ambulatory Visit (INDEPENDENT_AMBULATORY_CARE_PROVIDER_SITE_OTHER): Payer: PPO | Admitting: *Deleted

## 2019-08-25 DIAGNOSIS — I480 Paroxysmal atrial fibrillation: Secondary | ICD-10-CM

## 2019-08-25 LAB — CUP PACEART REMOTE DEVICE CHECK
Date Time Interrogation Session: 20210210235713
Implantable Pulse Generator Implant Date: 20170724

## 2019-08-25 NOTE — Progress Notes (Signed)
ILR Remote 

## 2019-09-26 ENCOUNTER — Encounter: Payer: PPO | Admitting: Cardiology

## 2019-09-26 ENCOUNTER — Ambulatory Visit (INDEPENDENT_AMBULATORY_CARE_PROVIDER_SITE_OTHER): Payer: PPO | Admitting: *Deleted

## 2019-09-26 DIAGNOSIS — I480 Paroxysmal atrial fibrillation: Secondary | ICD-10-CM | POA: Diagnosis not present

## 2019-09-26 LAB — CUP PACEART REMOTE DEVICE CHECK
Date Time Interrogation Session: 20210314004124
Implantable Pulse Generator Implant Date: 20170724

## 2019-09-26 NOTE — Progress Notes (Signed)
ILR Remote 

## 2019-09-30 ENCOUNTER — Other Ambulatory Visit: Payer: Self-pay | Admitting: Cardiology

## 2019-09-30 NOTE — Telephone Encounter (Signed)
Received 90 day refill request for Warfarin, pt is overdue for follow-up last seen 07/04/20, was due for f/u on 07/29/19.  Pt has rescheduled follow-up but not until 10/27/19 when she sees Dr. Curt Bears.  Will refill for 30 tablets only.  Pt should not get 90 day refills secondary to non-compliance and pt safety.

## 2019-10-03 ENCOUNTER — Other Ambulatory Visit: Payer: Self-pay | Admitting: Cardiology

## 2019-10-03 ENCOUNTER — Encounter (HOSPITAL_COMMUNITY): Payer: PPO | Admitting: Cardiology

## 2019-10-03 NOTE — Telephone Encounter (Signed)
*  STAT* If patient is at the pharmacy, call can be transferred to refill team.   1. Which medications need to be refilled? (please list name of each medication and dose if known)   warfarin (COUMADIN) 5 MG tablet   2. Which pharmacy/location (including street and city if local pharmacy) is medication to be sent to? [facility] [address] [place]  3. Do they need a 30 day or 90 day supply? 90 day supply

## 2019-10-03 NOTE — Telephone Encounter (Signed)
Pt overdue for an appointment. Pt has an appointment scheduled for 11/01/2019 but has not been seen since 07/05/2019. Called pt and LMOM.

## 2019-10-07 NOTE — Telephone Encounter (Addendum)
Have not been able to get in touch with pt to see if pt can come in sooner to get her INR checked. Prescription refill was sent on 09/30/2019 for a 30 day supply to get her to her appointment on 11/01/2019. Will not send in refill at this time. (see refill encounter from 09/30/2019)

## 2019-10-10 DIAGNOSIS — M79672 Pain in left foot: Secondary | ICD-10-CM | POA: Diagnosis not present

## 2019-10-14 ENCOUNTER — Telehealth: Payer: Self-pay

## 2019-10-14 NOTE — Telephone Encounter (Signed)
The pt daughter Tilda Burrow states the pt was prescribe a new medication and wanted to discuss it with Jonni Sanger or Dr. Curt Bears nurse. I told her per Venida Jarvis, rn the pt has not seen Dr. Curt Bears since 2019 and needs to speak with the ordering provider. Deanna asked was we refusing to speak with them. I state ma'am it just Dr. Curt Bears have not seen the pt since 2019. Deanna mentioned that the pt takes [facility] and they get her to the appointment early. She is saying the pt can not stand for 15 minutes before they let her upstairs.  She then states the pt did come in 02/19/2019 to see Oda Kilts and needs someone to answer her questions about the medication. I told her I will put it in a phone note and send it to Dr. Curt Bears nurse and [place], [place]. Deanna verbalized understanding.  Deanna called back stating the ordering Doctor office is closed today and really needs someone to call her back.  Deanna phone number is [phone].

## 2019-10-14 NOTE — Telephone Encounter (Signed)
Spoke to dtr, with pt in background on speaker phone. They are calling in about Augmentin recently prescribed by Dr. Rip Harbour for her foot.  After 4/5 tabs "pt started bleeding from her vagina".  Pt reports that it is not a constant bleeding, but there is "quite a lot on the pad" the she wears. States they read something that said abx and Coumadin could have interactions/bleeding issues. Informed that we did not prescribe medication and will not advise on what she should do.  She reports that they called [place] b/c [place] office is closed and pharmacist told them to call us.  Advised that she hasn't been seen in Coumadin clinic since December.  B/c of this we do not know her INR/Coumadin status and will not advise her. Advised, again, to call [place] office and speak to on-call staff for advisement  She complains that when she comes to our office they make her wait downstairs and she cannot stand up long at all.  She states there also are no wheelchairs down there for her to sit in.  Advised pt that all she needs to do is speak with staff downstairs who can ensure finding her something to sit on while having to wait. Pt informed that I would be glad to move her Coumadin appt (currently scheduled for 4/20) up, but she denies d/t wanting to come at same time seeing [place]. Dtr appreciates the return call.

## 2019-10-17 ENCOUNTER — Telehealth: Payer: Self-pay

## 2019-10-17 NOTE — Telephone Encounter (Signed)
Received LINQ alert that battery has reached RRT 10/12/19. LMOVM for patient to call DC back. Direct phone number along with office hours provided.

## 2019-10-20 NOTE — Telephone Encounter (Signed)
LMOVM for patient to call DC back. Hours and direct phone number provided.

## 2019-10-24 ENCOUNTER — Encounter: Payer: PPO | Admitting: Cardiology

## 2019-10-24 NOTE — Telephone Encounter (Signed)
LMOVM for patient to call DC back. Direct phone number and hours provided.

## 2019-10-26 ENCOUNTER — Encounter: Payer: Self-pay | Admitting: *Deleted

## 2019-10-26 NOTE — Telephone Encounter (Signed)
Unable to reach patient x3 attempts. Sent MyChart message. Will discuss options for LINQ at RRT at upcoming [place] with Dr. Curt Bears on 11/01/19.

## 2019-11-01 ENCOUNTER — Ambulatory Visit (INDEPENDENT_AMBULATORY_CARE_PROVIDER_SITE_OTHER): Payer: PPO | Admitting: *Deleted

## 2019-11-01 ENCOUNTER — Encounter: Payer: Self-pay | Admitting: Cardiology

## 2019-11-01 ENCOUNTER — Other Ambulatory Visit: Payer: Self-pay

## 2019-11-01 ENCOUNTER — Ambulatory Visit: Payer: PPO | Admitting: Cardiology

## 2019-11-01 VITALS — BP 126/72 | HR 71 | Ht 66.0 in

## 2019-11-01 DIAGNOSIS — I5032 Chronic diastolic (congestive) heart failure: Secondary | ICD-10-CM

## 2019-11-01 DIAGNOSIS — R0683 Snoring: Secondary | ICD-10-CM

## 2019-11-01 DIAGNOSIS — R400 Somnolence: Secondary | ICD-10-CM

## 2019-11-01 DIAGNOSIS — Z79899 Other long term (current) drug therapy: Secondary | ICD-10-CM | POA: Diagnosis not present

## 2019-11-01 DIAGNOSIS — Z5181 Encounter for therapeutic drug level monitoring: Secondary | ICD-10-CM

## 2019-11-01 LAB — PROTIME-INR
INR: 6 (ref 0.9–1.2)
Prothrombin Time: 63.5 s — ABNORMAL HIGH (ref 9.1–12.0)

## 2019-11-01 LAB — POCT INR: INR: 6.7 — AB (ref 2.0–3.0)

## 2019-11-01 MED ORDER — AMIODARONE HCL 200 MG PO TABS: 200.0000 mg | ORAL_TABLET | Freq: Every day | ORAL | 1 refills | Status: DC

## 2019-11-01 MED ORDER — TORSEMIDE 20 MG PO TABS: 60.0000 mg | ORAL_TABLET | Freq: Every day | ORAL | 1 refills | Status: DC

## 2019-11-01 MED ORDER — POTASSIUM CHLORIDE ER 20 MEQ PO TBCR: 20.0000 meq | EXTENDED_RELEASE_TABLET | Freq: Every day | ORAL | 1 refills | Status: DC

## 2019-11-01 NOTE — Progress Notes (Signed)
Pt sent to lab to get stat INR. Informed pt that she is at a higher risk for bleeding and to seek  medical attention if she starts having any bleeding. Instructed pt to eat a salad or a serving of greens tonight. Informed pt to not take any warfarin until we call her with further dosing instructions.  Informed pt to have her phone close by and to expect a phone call from [place] tomorrow. Pt stated to first call her daughter at [phone].   Lab called at 1655 stating INR was 6.0.

## 2019-11-01 NOTE — Progress Notes (Signed)
Electrophysiology Office Note   Date:  11/01/2019   ID:  Hannah [name], DOB December 20, 1945, MRN [id number]  PCP:  Hannah Amel, MD Primary Electrophysiologist:  Hannah Haw, MD    No chief complaint on file.    History of Present Illness: Hannah [name] is a 74 y.o. female who is being seen today for the evaluation of CHF, AF at the request of Hannah [name], [place], MD. Presenting today for electrophysiology evaluation.  She presented to the hospital on 09/13/16 complaining of productive cough with yellow sputum, shortness of breath, and palpitations. She was found to be in decompensated heart failure versus pneumonia. She was treated with antibiotics and Lasix. Does have a history of atrial fibrillation and is on warfarin and amiodarone.  She has a variety of complaints today.  Most of her complaints are around her orthopedic issues.  She has knee and ankle pain.  She also has quite a bit of lower extremity swelling.  She has been in atrial fibrillation since November.  She has weakness, fatigue, and shortness of breath.  She has gained quite a bit of weight since last being seen with a BMI of approximately 50.  She is currently in a wheelchair and cannot ambulate very far.  She has not been seen in the last 2 years.   Past Medical History:  Diagnosis Date  . Atrial fibrillation (Hannah [name])   . Diabetes mellitus without complication ([place])   . Glaucoma    BILATERAL  . Hypertension   . Thyroid disease    Past Surgical History:  Procedure Laterality Date  . APPENDECTOMY    . CHOLECYSTECTOMY    . HERNIA REPAIR     UMBILICAL  . LASIK     BILATERAL     Current Outpatient Medications  Medication Sig Dispense Refill  . amiodarone (PACERONE) 200 MG tablet Take 1 tablet (200 mg total) by mouth daily. 90 tablet 1  . busPIRone (BUSPAR) 15 MG tablet TAKE 1 TABLET THREE TIMES A DAY 270 tablet 1  . DULoxetine (CYMBALTA) 30 MG capsule Take 3 capsules (90 mg total) by mouth daily. 270  capsule 1  . levocetirizine (XYZAL) 5 MG tablet Take 5 mg by mouth every evening.    Marland Kitchen levothyroxine (SYNTHROID, LEVOTHROID) 75 MCG tablet Take 75 mcg by mouth daily.    . metFORMIN (GLUCOPHAGE) 500 MG tablet Take 500 mg by mouth daily with breakfast.    . metolazone (ZAROXOLYN) 2.5 MG tablet TAKE ONE TABLET BY MOUTH DAILY AS NEEDED. 10 tablet 7  . montelukast (SINGULAIR) 10 MG tablet Take 10 mg by mouth every evening.    Marland Kitchen NYAMYC powder as directed.    . Potassium Chloride ER 20 MEQ TBCR Take 20 mEq by mouth daily. Take extra 20 meq on days she takes extra diuretic. 90 tablet 1  . rosuvastatin (CRESTOR) 5 MG tablet Take 5 mg by mouth daily.    Marland Kitchen torsemide (DEMADEX) 20 MG tablet Take 3 tablets (60 mg total) by mouth daily. Take extra as needed or directed. 270 tablet 1  . traZODone (DESYREL) 100 MG tablet Take 2 tablets (200 mg total) by mouth at bedtime. 180 tablet 1  . warfarin (COUMADIN) 5 MG tablet TAKE TABLETS BY MOUTH AS INSTRUCTED AT THE COUMADIN CLINIC.  Pt is Overdue for follow-up, can only refill 30 day supply. 30 tablet 0   No current facility-administered medications for this visit.    Allergies:   Penicillins   Social History:  The patient  reports that she has never smoked. She has never used smokeless tobacco. She reports current alcohol use of about 1.0 standard drinks of alcohol per week. She reports that she does not use drugs.   Family History:  The patient's family history includes Colon cancer in her father; Heart attack in her brother and paternal grandfather; Heart disease in her brother; Lung cancer in her maternal grandfather and mother.   ROS:  Please see the history of present illness.   Otherwise, review of systems is positive for none.   All other systems are reviewed and negative.   PHYSICAL EXAM: VS:  BP 126/72   Pulse 71   Ht '5\' 6"'  (1.676 m)   SpO2 97%   BMI 50.36 kg/m  , BMI Body mass index is 50.36 kg/m. GEN: Well nourished, well developed, in no  acute distress  HEENT: normal  Neck: no JVD, carotid bruits, or masses Cardiac: iRRR; no murmurs, rubs, or gallops,no edema  Respiratory:  clear to auscultation bilaterally, normal work of breathing GI: soft, nontender, nondistended, + BS MS: no deformity or atrophy  Skin: warm and dry, device site well healed Neuro:  Strength and sensation are intact Psych: euthymic mood, full affect  EKG:  EKG is ordered today. Personal review of the ekg ordered shows atrial fibrillation  Personal review of the device interrogation today. Results in [place]: 07/05/2019: BUN 32; Creatinine, Ser 1.61; Potassium 4.0; Sodium 143    Lipid Panel  No results found for: CHOL, TRIG, HDL, CHOLHDL, VLDL, LDLCALC, LDLDIRECT   Wt Readings from Last 3 Encounters:  03/01/19 (!) 312 lb (141.5 kg)  12/27/18 (!) 312 lb 9.6 oz (141.8 kg)  12/07/18 (!) 324 lb (147 kg)      Other studies Reviewed: Additional studies/ records that were reviewed today include: TTE 01/18/18 Review of the above records today demonstrates:  - Left ventricle: The cavity size was mildly dilated. Systolic   function was normal. The estimated ejection fraction was in the   range of 60% to 65%. Wall motion was normal; there were no   regional wall motion abnormalities. Left ventricular diastolic   function parameters were normal. - Left atrium: The atrium was severely dilated. - Atrial septum: There was increased thickness of the septum,   consistent with lipomatous hypertrophy.   ASSESSMENT AND PLAN:  1.  Persistent atrial fibrillation: Currently on amiodarone and warfarin.  CHA2DS2-VASc of 4.  She has not had amiodarone labs checked and we Hannah [name] check them today.  She Hannah [name] need a comprehensive metabolic, and TSH.  She does snore at night and thus we Hannah [name] order a sleep study as she has morbid obesity.  [name]  If she does not convert to sinus rhythm,  she likely Hannah [name] be permanent atrial fibrillation.   2. Chronic diastolic heart failure: She has significant lower extremity edema.  This has been going on for many months.  She is unfortunately not been following up consistently in clinic.  Hannah [name] increase her torsemide to 60 mg twice daily and increase her potassium while she is on a higher dose.  We Hannah [name] check her kidney function to make sure that it is remained stable.  Current medicines are reviewed at length with the patient today.   The patient does not have concerns regarding her medicines.  The following changes were made today: Increase torsemide  Labs/ tests ordered today  include:  Orders Placed This Encounter  Procedures  . CBC  . Comp Met (CMET)  . Protime-INR  . TSH  . Ambulatory referral to Sleep Studies  . EKG 12-Lead     Disposition:   FU with Hannah [name] 3 months  Signed, Hannah Pollok Meredith Leeds, MD  11/01/2019 4:35 PM     [address] [place] [address] [phone] (office) [phone] (fax)

## 2019-11-01 NOTE — Patient Instructions (Addendum)
Description    Called and spoke to pt and daughter instructed for pt to hold warfarin on 4/21, 4/22 and 4/23 .Instructed for pt to start taking the dose previously given 1 tablet daily except for 1/2 a tablet on Sundays. Recheck INR on Monday 11/07/2019.  Contact Coumadin Clinic with any questions or any new meds [phone].

## 2019-11-01 NOTE — Patient Instructions (Addendum)
Medication Instructions:  Your physician has recommended you make the following change in your medication:  1. INCREASE Torsemide to 60 mg twice a day for 1 week, then return to normal dosing.  *If you need a refill on your cardiac medications before your next appointment, please call your pharmacy*   Lab Work: Today: CMET & TSH If you have labs (blood work) drawn today and your tests are completely normal, you will receive your results only by: Marland Kitchen MyChart Message (if you have MyChart) OR . A paper copy in the mail If you have any lab test that is abnormal or we need to change your treatment, we will call you to review the results.   Testing/Procedures: Your physician has recommended that you have a sleep study. This test records several body functions during sleep, including: brain activity, eye movement, oxygen and carbon dioxide blood levels, heart rate and rhythm, breathing rate and rhythm, the flow of air through your mouth and nose, snoring, body muscle movements, and chest and belly movement. The office will call to arrange this testing.  Your physician has recommended that you have a Cardioversion (DCCV). Electrical Cardioversion uses a jolt of electricity to your heart either through paddles or wired patches attached to your chest. This is a controlled, usually prescheduled, procedure. Defibrillation is done under light anesthesia in the hospital, and you usually go home the day of the procedure. This is done to get your heart back into a normal rhythm. You are not awake for the procedure. The nurse will call you to arrange this.     Follow-Up: At [facility], you and your health needs are our priority.  As part of our continuing mission to provide you with exceptional heart care, we have created designated Provider Care Teams.  These Care Teams include your primary Cardiologist (physician) and Advanced Practice Providers (APPs -  Physician Assistants and Nurse Practitioners) who  all work together to provide you with the care you need, when you need it.  We recommend signing up for the patient portal called "MyChart".  Sign up information is provided on this After Visit Summary.  MyChart is used to connect with patients for Virtual Visits (Telemedicine).  Patients are able to view lab/test results, encounter notes, upcoming appointments, etc.  Non-urgent messages can be sent to your provider as well.   To learn more about what you can do with MyChart, go to [email or web address].    Your next appointment:   3 month(s)  The format for your next appointment:   In Person  Provider:   You will see one of the following Advanced Practice Providers on your designated Care Team:    Chanetta Marshall, NP  Tommye Standard, PA-C  Legrand Como "Nelson" [place], [place]    Thank you for choosing [facility]!!   Trinidad Curet, RN [phone]    Other Instructions    COVID TEST-- On __________________Dennis Bast will go to [facility] hospital ([place]) for your Covid testing.   This is a drive thru test site.  There will be multiple testing areas.  Be sure to share with the first checkpoint that you are there for pre-procedure/surgery testing. This will put you into the right (yellow) lane that leads to the PAT testing team. Stay in your car and the nurse team will come to your car to test you.  After you are tested please go home and self quarantine until the day of your procedure.  Your provider has recommended a cardioversion.   You are scheduled for a cardioversion on __________ at _________ with Dr. ____________ or associates. Please go to [facility]  at _________, Enter through the [place] not have any food or drink after midnight the night before..  You may take your medicines with a sip of water on the day of your procedure.  You will need someone to drive you home following your procedure.   Call the [place] office at [phone] if you have any questions, problems or concerns.       Electrical Cardioversion Electrical cardioversion is the delivery of a jolt of electricity to change the rhythm of the heart. Sticky patches or metal paddles are placed on the chest to deliver the electricity from a device. This is done to restore a normal rhythm. A rhythm that is too fast or not regular keeps the heart from pumping well. Electrical cardioversion is done in an emergency if:   There is low or no blood pressure as a result of the heart rhythm.    Normal rhythm must be restored as fast as possible to protect the brain and heart from further damage.    It may save a life. Cardioversion may be done for heart rhythms that are not immediately life threatening, such as atrial fibrillation or flutter, in which:   The heart is beating too fast or is not regular.    Medicine to change the rhythm has not worked.    It is safe to wait in order to allow time for preparation.  Symptoms of the abnormal rhythm are bothersome.  The risk of stroke and other serious problems can be reduced.  LET [facility] CARE PROVIDER KNOW ABOUT:   Any allergies you have.  All medicines you are taking, including vitamins, herbs, eye drops, creams, and over-the-counter medicines.  Previous problems you or members of your family have had with the use of anesthetics.    Any blood disorders you have.    Previous surgeries you have had.    Medical conditions you have.   RISKS AND COMPLICATIONS  Generally, this is a safe procedure. However, problems can occur and include:   Breathing problems related to the anesthetic used.  A blood clot that breaks free and travels to other parts of your body. This could cause a stroke or other problems. The risk of this is lowered by use of blood-thinning medicine (anticoagulant) prior to the procedure.  Cardiac arrest (rare).   BEFORE THE PROCEDURE   You  may have tests to detect blood clots in your heart and to evaluate heart function.   You may start taking anticoagulants so your blood does not clot as easily.    Medicines may be given to help stabilize your heart rate and rhythm.   PROCEDURE  You will be given medicine through an IV tube to reduce discomfort and make you sleepy (sedative).    An electrical shock will be delivered.   AFTER THE PROCEDURE Your heart rhythm will be watched to make sure it does not change. You will need someone to drive you home.

## 2019-11-02 ENCOUNTER — Other Ambulatory Visit: Payer: Self-pay | Admitting: Student

## 2019-11-02 DIAGNOSIS — I5032 Chronic diastolic (congestive) heart failure: Secondary | ICD-10-CM

## 2019-11-02 LAB — COMPREHENSIVE METABOLIC PANEL
ALT: 15 IU/L (ref 0–32)
AST: 14 IU/L (ref 0–40)
Albumin/Globulin Ratio: 1.1 — ABNORMAL LOW (ref 1.2–2.2)
Albumin: 4 g/dL (ref 3.7–4.7)
Alkaline Phosphatase: 67 IU/L (ref 39–117)
BUN/Creatinine Ratio: 20 (ref 12–28)
BUN: 23 mg/dL (ref 8–27)
Bilirubin Total: 0.7 mg/dL (ref 0.0–1.2)
CO2: 25 mmol/L (ref 20–29)
Calcium: 9 mg/dL (ref 8.7–10.3)
Chloride: 100 mmol/L (ref 96–106)
Creatinine, Ser: 1.17 mg/dL — ABNORMAL HIGH (ref 0.57–1.00)
GFR calc Af Amer: 54 mL/min/{1.73_m2} — ABNORMAL LOW (ref >59)
GFR calc non Af Amer: 47 mL/min/{1.73_m2} — ABNORMAL LOW (ref >59)
Globulin, Total: 3.6 g/dL (ref 1.5–4.5)
Glucose: 99 mg/dL (ref 65–99)
Potassium: 4.3 mmol/L (ref 3.5–5.2)
Sodium: 142 mmol/L (ref 134–144)
Total Protein: 7.6 g/dL (ref 6.0–8.5)

## 2019-11-02 LAB — CBC
Hematocrit: 30.1 % — ABNORMAL LOW (ref 34.0–46.6)
Hemoglobin: 8.9 g/dL — ABNORMAL LOW (ref 11.1–15.9)
MCH: 24.3 pg — ABNORMAL LOW (ref 26.6–33.0)
MCHC: 29.6 g/dL — ABNORMAL LOW (ref 31.5–35.7)
MCV: 82 fL (ref 79–97)
Platelets: 249 10*3/uL (ref 150–450)
RBC: 3.67 x10E6/uL — ABNORMAL LOW (ref 3.77–5.28)
RDW: 15.6 % — ABNORMAL HIGH (ref 11.7–15.4)
WBC: 5 10*3/uL (ref 3.4–10.8)

## 2019-11-02 LAB — TSH: TSH: 10.9 u[IU]/mL — ABNORMAL HIGH (ref 0.450–4.500)

## 2019-11-12 DIAGNOSIS — E8770 Fluid overload, unspecified: Secondary | ICD-10-CM

## 2019-11-12 HISTORY — DX: Fluid overload, unspecified: E87.70

## 2019-11-14 DIAGNOSIS — I890 Lymphedema, not elsewhere classified: Secondary | ICD-10-CM | POA: Diagnosis not present

## 2019-11-14 DIAGNOSIS — I8311 Varicose veins of right lower extremity with inflammation: Secondary | ICD-10-CM | POA: Diagnosis not present

## 2019-11-14 DIAGNOSIS — I8312 Varicose veins of left lower extremity with inflammation: Secondary | ICD-10-CM | POA: Diagnosis not present

## 2019-11-16 ENCOUNTER — Encounter (HOSPITAL_COMMUNITY): Payer: Self-pay | Admitting: Cardiology

## 2019-11-16 ENCOUNTER — Ambulatory Visit (HOSPITAL_COMMUNITY)
Admission: RE | Admit: 2019-11-16 | Discharge: 2019-11-16 | Disposition: A | Discharge status: Home / Self Care | Payer: PPO | Source: Ambulatory Visit | Attending: Cardiology | Admitting: Cardiology

## 2019-11-16 ENCOUNTER — Other Ambulatory Visit: Payer: Self-pay

## 2019-11-16 ENCOUNTER — Telehealth: Payer: Self-pay | Admitting: *Deleted

## 2019-11-16 VITALS — BP 134/62 | HR 74 | Wt 338.0 lb

## 2019-11-16 DIAGNOSIS — Z7901 Long term (current) use of anticoagulants: Secondary | ICD-10-CM | POA: Insufficient documentation

## 2019-11-16 DIAGNOSIS — E039 Hypothyroidism, unspecified: Secondary | ICD-10-CM | POA: Diagnosis not present

## 2019-11-16 DIAGNOSIS — E785 Hyperlipidemia, unspecified: Secondary | ICD-10-CM | POA: Insufficient documentation

## 2019-11-16 DIAGNOSIS — Z79899 Other long term (current) drug therapy: Secondary | ICD-10-CM | POA: Diagnosis not present

## 2019-11-16 DIAGNOSIS — I110 Hypertensive heart disease with heart failure: Secondary | ICD-10-CM | POA: Insufficient documentation

## 2019-11-16 DIAGNOSIS — I5032 Chronic diastolic (congestive) heart failure: Secondary | ICD-10-CM | POA: Diagnosis not present

## 2019-11-16 DIAGNOSIS — Z7984 Long term (current) use of oral hypoglycemic drugs: Secondary | ICD-10-CM | POA: Insufficient documentation

## 2019-11-16 DIAGNOSIS — E119 Type 2 diabetes mellitus without complications: Secondary | ICD-10-CM | POA: Diagnosis not present

## 2019-11-16 DIAGNOSIS — I480 Paroxysmal atrial fibrillation: Secondary | ICD-10-CM | POA: Diagnosis not present

## 2019-11-16 DIAGNOSIS — G4733 Obstructive sleep apnea (adult) (pediatric): Secondary | ICD-10-CM | POA: Insufficient documentation

## 2019-11-16 DIAGNOSIS — I4819 Other persistent atrial fibrillation: Secondary | ICD-10-CM | POA: Diagnosis not present

## 2019-11-16 DIAGNOSIS — Z8249 Family history of ischemic heart disease and other diseases of the circulatory system: Secondary | ICD-10-CM | POA: Diagnosis not present

## 2019-11-16 LAB — PROTIME-INR
INR: 2.5 — ABNORMAL HIGH (ref 0.8–1.2)
Prothrombin Time: 26.5 seconds — ABNORMAL HIGH (ref 11.4–15.2)

## 2019-11-16 LAB — BASIC METABOLIC PANEL
Anion gap: 9 (ref 5–15)
BUN: 21 mg/dL (ref 8–23)
CO2: 28 mmol/L (ref 22–32)
Calcium: 8.9 mg/dL (ref 8.9–10.3)
Chloride: 103 mmol/L (ref 98–111)
Creatinine, Ser: 1.27 mg/dL — ABNORMAL HIGH (ref 0.44–1.00)
GFR calc Af Amer: 49 mL/min — ABNORMAL LOW (ref >60)
GFR calc non Af Amer: 42 mL/min — ABNORMAL LOW (ref >60)
Glucose, Bld: 98 mg/dL (ref 70–99)
Potassium: 4.4 mmol/L (ref 3.5–5.1)
Sodium: 140 mmol/L (ref 135–145)

## 2019-11-16 LAB — CBC
HCT: 30.6 % — ABNORMAL LOW (ref 36.0–46.0)
Hemoglobin: 8.8 g/dL — ABNORMAL LOW (ref 12.0–15.0)
MCH: 24.8 pg — ABNORMAL LOW (ref 26.0–34.0)
MCHC: 28.8 g/dL — ABNORMAL LOW (ref 30.0–36.0)
MCV: 86.2 fL (ref 80.0–100.0)
Platelets: 215 10*3/uL (ref 150–400)
RBC: 3.55 MIL/uL — ABNORMAL LOW (ref 3.87–5.11)
RDW: 17.5 % — ABNORMAL HIGH (ref 11.5–15.5)
WBC: 4.3 10*3/uL (ref 4.0–10.5)
nRBC: 0 % (ref 0.0–0.2)

## 2019-11-16 MED ORDER — TORSEMIDE 20 MG PO TABS: ORAL_TABLET | ORAL | 1 refills | Status: DC

## 2019-11-16 MED ORDER — POTASSIUM CHLORIDE ER 20 MEQ PO TBCR: 20.0000 meq | EXTENDED_RELEASE_TABLET | Freq: Two times a day (BID) | ORAL | 1 refills | Status: DC

## 2019-11-16 NOTE — Telephone Encounter (Signed)
Spoke to dtr. Aware we will change sleep study referral to pulmonology per pt request.  Scheduled TEE/DCCV for 5/19. Aware I will send message to social worker to help arrange transportation for covid screening/DCCV. We agreed to speak next week to go over instructions (give time for social worker to follow up with them).

## 2019-11-16 NOTE — Addendum Note (Signed)
Addended by: Stanton Kidney on: 11/16/2019 11:59 AM   Modules accepted: Orders

## 2019-11-16 NOTE — Progress Notes (Signed)
Patient Name: Hannah [name]        DOB: July 09, 1946      Height: 5'6    Weight:338lbs  Office Name: [facility]         Referring Provider: Dr. Loralie Champagne  Today's Date:11/16/2019 [place] (snore) Have you been told that you snore?     YES   T (tired) Are you often tired, fatigued, or sleepy during the day?   YES  O (obstruction) Do you stop breathing, choke, or gasp during sleep? NO   P (pressure) Do you have or are you being treated for high blood pressure? NO   B (BMI) Is your body index greater than 35 kg/m? YES   A (age) Are you 40 years old or older? YES   N (neck) Do you have a neck circumference greater than 16 inches?   YES/NO   G (gender) Are you a female? NO   TOTAL STOP/BANG "YES" ANSWERS 4                                                                       For Office Use Only              Procedure Order Form    YES to 3+ Stop Bang questions OR two clinical symptoms - patient qualifies for WatchPAT (CPT 95800)     Submit: This Form + Patient Face Sheet + Clinical Note via CloudPAT or Fax: [phone]         Clinical Notes: Will consult Sleep Specialist and refer for management of therapy due to patient increased risk of Sleep Apnea. Ordering a sleep study due to the following two clinical symptoms: Excessive daytime sleepiness G47.10 / Gastroesophageal reflux K21.9 / Nocturia R35.1 / Morning Headaches G44.221 / Difficulty concentrating R41.840 / Memory problems or poor judgment G31.84 / Personality changes or irritability R45.4 / Loud snoring R06.83 / Depression F32.9 / Unrefreshed by sleep G47.8 / Impotence N52.9 / History of high blood pressure R03.0 / Insomnia G47.00     I understand that I am proceeding with a home sleep apnea test as ordered by my treating physician. I understand that untreated sleep apnea is a serious cardiovascular risk factor and it is my responsibility to perform the test and seek  management for sleep apnea. I will be contacted with the results and be managed for sleep apnea by a local sleep physician. I will be receiving equipment and further instructions from [facility] I shall promptly ship back the equipment via the included mailing label. I understand my insurance will be billed for the test and as the patient I am responsible for any insurance related out-of-pocket costs incurred. I have been provided with written instructions and can call for additional video or telephonic instruction, with 24-hour availability of qualified personnel to answer any questions: Patient Help Desk [phone].  Patient Signature ______________________________________________________   Date______________________ Patient Telemedicine Verbal Consent   '

## 2019-11-16 NOTE — Patient Instructions (Addendum)
INCREASE Torsemide to 60mg (3 tablets) every morning and 40mg (2 tablets) every evening. Please be sure to not miss any doses.   TAKE METOLAZONE 2.5MG (1 TABLET) TOMORROW MORNING WITH TORSEMIDE.    INCREASE Potassium to 20mg  (1 tablet) by mouth twice daily.   Labs today We will only contact you if something comes back abnormal or we need to make some changes. Otherwise no news is good news!  Your provider has recommended that you have a home sleep study.  [employer] is the company that does these test.  They will contact you by phone and must speak with you before they can ship the equipment.  Once they have spoken with you they will send the equipment right to your home with instructions on how to set it up.  Once you have completed the test you just dispose of the equipment, the information is automatically uploaded to [place] via blue-tooth technology.  IF you have any questions or issues with the equipment please call the company directly at [phone].  If your test is positive for sleep apnea and you need a home CPAP machine you will be contacted by Dr Theodosia Blender office [facility]) to set this up.   Your physician recommends that you schedule a follow-up appointment in: 2 weeks with Dr Aundra Dubin   Dear Ms [name]  You are scheduled for a TEE/Cardioversion on Thursday, May 13th, 2021  with Dr. Aundra Dubin.  Please arrive at the [facility] (Main Entrance A) at [facility]: [address] [place], [place] [address] at 10:30 am.  DIET: Nothing to eat or drink after midnight except a sip of water with medications (see medication instructions below)   Medication Instructions: HOLD Torsemide and Glucophage on the morning of your procedure  Continue your anticoagulant: Coumadin  You will need to continue your anticoagulant after your procedure until you are told by your provider that it is safe to stop.   Labs: done today in office  You will need a pre procedure COVID test      WHEN:  Tuesday May 11th, 2021 at [place]: [facility]       [place] [id number]  This is a drive thru testing site, you will remain in your car. Be sure to get in the line FOR PROCEDURES Once you have been swabbed you will need to remain home in quarantine until you return for your procedure.   You must have a responsible person to drive you home and stay in the waiting area during your procedure. Failure to do so could result in cancellation.  Bring your insurance cards.  *Special Note: Every effort is made to have your procedure done on time. Occasionally there are emergencies that occur at the hospital that may cause delays. Please be patient if a delay does occur.

## 2019-11-17 ENCOUNTER — Other Ambulatory Visit (HOSPITAL_COMMUNITY): Payer: Self-pay

## 2019-11-17 ENCOUNTER — Telehealth: Payer: Self-pay | Admitting: Licensed Clinical Social Worker

## 2019-11-17 ENCOUNTER — Telehealth (HOSPITAL_COMMUNITY): Payer: Self-pay | Admitting: Licensed Clinical Social Worker

## 2019-11-17 DIAGNOSIS — I5032 Chronic diastolic (congestive) heart failure: Secondary | ICD-10-CM

## 2019-11-17 NOTE — Telephone Encounter (Signed)
CSW set up transportation through [employer] to assist with pt getting to COVID test and cardioversion next week.  Spoke with pt daughter to provide her with ride information and the phone number for [facility] for any questions or changes in transport.  CSW will continue to follow and assist as needed  Jorge Ny, [facility] Desk#: [phone] Cell#: [phone]

## 2019-11-17 NOTE — Progress Notes (Signed)
PCP: Lujean Amel, MD  EP: Dr. Curt Bears HF Cardiology: Dr. Aundra Dubin  74 y.o. with history of atrial fibrillation and chronic diastolic CHF was referred by Dr. Curt Bears for evaluation of CHF.  She moved to [facility] from [place] in 2018.  She was first diagnosed with atrial fibrillation in [place], had cardioversion.  She says that she has been on amiodarone for several years. She has also been taking torsemide for several years.   She is now in persistent atrial fibrillation, still on amiodarone.  She has been struggling with volume overload, and says that she has developed gradually worsening peripheral edema.  Now, lower legs are weeping.  Exertional dyspnea also has gradually worsened, she is short of breath walking from living room to bathroom.  She sleeps in a chair due to orthopnea.  No chest pain.  No lightheadedness.  She does not feel palpitations. She has daytime sleepiness and snoring.   Labs (4/21): TSH elevated 10.9, K 4.3, creatinine 1.17, LFTs normal.   ECG (personally reviewed): Atrial fibrillation with RBBB  PMH: 1. Type 2 diabetes 2. HTN 3. Atrial fibrillation: Paroxysmal. 1st noted in 2010.  Had DCCV in the past.  4. Chronic diastolic CHF: Echo (5/36) with EF 60-65%, severe LAE.  5. Hypothyroidism.   Social History   Socioeconomic History  . Marital status: Legally Separated    Spouse name: Not on file  . Number of children: Not on file  . Years of education: Not on file  . Highest education level: Not on file  Occupational History  . Not on file  Tobacco Use  . Smoking status: Never Smoker  . Smokeless tobacco: Never Used  Substance and Sexual Activity  . Alcohol use: Yes    Alcohol/week: 1.0 standard drinks    Types: 1 Glasses of wine per week    Comment: occ  . Drug use: No  . Sexual activity: Never  Other Topics Concern  . Not on file  Social History Narrative  . Not on file   Social Determinants of Health   Financial Resource Strain:   . Difficulty of Paying Living  Expenses:   Food Insecurity:   . Worried About Charity fundraiser in the Last Year:   . Arboriculturist in the Last Year:   Transportation Needs:   . Film/video editor (Medical):   Marland Kitchen Lack of Transportation (Non-Medical):   Physical Activity:   . Days of Exercise per Week:   . Minutes of Exercise per Session:   Stress:   . Feeling of Stress :   Social Connections:   . Frequency of Communication with Friends and Family:   . Frequency of Social Gatherings with Friends and Family:   . Attends Religious Services:   . Active Member of Clubs or Organizations:   . Attends Archivist Meetings:   Marland Kitchen Marital Status:   Intimate Partner Violence:   . Fear of Current or Ex-Partner:   . Emotionally Abused:   Marland Kitchen Physically Abused:   . Sexually Abused:    Family History  Problem Relation Age of Onset  . Lung cancer Mother   . Colon cancer Father   . Heart attack Brother   . Heart disease Brother   . Lung cancer Maternal Grandfather   . Heart attack Paternal Grandfather    ROS: All systems reviewed and negative except as per HPI.   Current Outpatient Medications  Medication Sig Dispense Refill  . amiodarone (PACERONE) 200 MG tablet  Take 1 tablet (200 mg total) by mouth daily. 90 tablet 1  . busPIRone (BUSPAR) 15 MG tablet TAKE 1 TABLET THREE TIMES A DAY 270 tablet 1  . DULoxetine (CYMBALTA) 30 MG capsule Take 3 capsules (90 mg total) by mouth daily. 270 capsule 1  . levocetirizine (XYZAL) 5 MG tablet Take 5 mg by mouth every evening.    Marland Kitchen levothyroxine (SYNTHROID, LEVOTHROID) 75 MCG tablet Take 75 mcg by mouth daily.    . metFORMIN (GLUCOPHAGE) 500 MG tablet Take 500 mg by mouth daily with breakfast.    . metolazone (ZAROXOLYN) 2.5 MG tablet TAKE ONE TABLET BY MOUTH DAILY AS NEEDED 30 tablet 8  . montelukast (SINGULAIR) 10 MG tablet Take 10 mg by mouth every evening.    Marland Kitchen NYAMYC powder as directed.    . Potassium Chloride ER 20 MEQ TBCR Take 20 mEq by mouth 2 (two) times  daily. Take extra 20 meq on days she takes extra diuretic. 200 tablet 1  . rosuvastatin (CRESTOR) 5 MG tablet Take 5 mg by mouth daily.    Marland Kitchen torsemide (DEMADEX) 20 MG tablet Take 3 tablets (60 mg total) by mouth every morning AND 2 tablets (40 mg total) every evening. Take extra as needed or directed.. 448 tablet 1  . traZODone (DESYREL) 100 MG tablet Take 2 tablets (200 mg total) by mouth at bedtime. 180 tablet 1  . triamcinolone ointment (KENALOG) 0.1 % Apply BID to legs 2 weeks on 1 week off, repeat    . warfarin (COUMADIN) 5 MG tablet TAKE TABLETS BY MOUTH AS INSTRUCTED AT THE COUMADIN CLINIC.  Pt is Overdue for follow-up, can only refill 30 day supply. 30 tablet 0   No current facility-administered medications for this encounter.   BP 134/62   Pulse 74   Wt (!) 153.3 kg (338 lb)   SpO2 100%   BMI 54.55 kg/m  General: NAD Neck: JVP 14 cm, no thyromegaly or thyroid nodule.  Lungs: Clear to auscultation bilaterally with normal respiratory effort. CV: Nondisplaced PMI.  Heart regular S1/S2, no S3/S4, 1/6 SEM RUSB.  2+ edema to thighs bilaterally.  No carotid bruit.  Normal pedal pulses.  Abdomen: Soft, nontender, no hepatosplenomegaly, no distention.  Skin: Intact without lesions or rashes.  Neurologic: Alert and oriented x 3.  Psych: Normal affect. Extremities: No clubbing or cyanosis. Lower legs with weeping.  HEENT: Normal.   Assessment/plan: 1. Chronic diastolic CHF: Echo in 8/84 with EF 60-65%, severe LAE.  She is markedly volume overloaded on exam with NYHA class IIIb symptoms.   - Increase torsemide to 60 qam/40 qpm. BMET today and in 10 days.  - Increase KCl to 20 bid.  - Give 1 dose of metolazone 2.5 mg tomorrow morning with am torsemide.  - She needs a lot of diuresis, may need to be admitted for IV diuresis if she does not make progress.  2. Atrial fibrillation: Persistent.  She has been on amiodarone but appears to have been back in atrial fibrillation for a number of  weeks. I think that atrial fibrillation is likely helping to drive her CHF, would help to get her back into NSR.  - I will arrange for DCCV next week.  Since we do not have enough therapeutic INRs in a row, will go ahead and do TEE (will get INR the day of the procedure) rather than waiting longer given the severity of her CHF.  - Continue amiodarone, recent LFTs normal. She has hypothyroidism and is  on Levoxyl.  She will need a regular eye exam while on amiodarone.  - Continue warfarin.  3. OSA: Strongly suspect sleep apnea.  - I will arrange for home sleep study.  4. Hyperlipidemia: She is on Crestor.   Loralie Champagne 11/17/2019

## 2019-11-17 NOTE — Telephone Encounter (Signed)
CSW received referral to assist with transportation to upcoming procedure. CSW requested to contact daughter. Message left for return call on daughters phone. [name], [place], [place]

## 2019-11-18 ENCOUNTER — Telehealth (HOSPITAL_COMMUNITY): Payer: Self-pay

## 2019-11-18 NOTE — Telephone Encounter (Signed)
Order, OV note, stop bang and demographics all faxed to [employer] at [phone]  

## 2019-11-20 ENCOUNTER — Other Ambulatory Visit: Payer: Self-pay | Admitting: Cardiology

## 2019-11-21 NOTE — Telephone Encounter (Signed)
Called dtr to follow up and discuss DCCV plans, she reports mom saw Dr. Aundra Dubin last week who moved DCCV up from 5/19.  He is performing this week.  She reports that they received a "return kit" for her monitor and they aren't sure why.  The battery is still working...Marland KitchenMarland Kitchen  Aware I will look into this and let them know.  She also asks if planning to reimplant another ILR.  This would be preferred by pt for AFib mngt as she can't feel her AFib.  The dtr asks, "woudln't we want to still monitor it?".   Aware I will discuss this with Camnitz and let her know his recommendation.  Aware it may be next month before return call on this as he is out until of the month. dtr agreeable to plan.

## 2019-11-21 NOTE — Telephone Encounter (Signed)
Pt is overdue for INR check so unable to fill refill request. Called pt and left her a message to call back to schedule an appt.

## 2019-11-22 ENCOUNTER — Other Ambulatory Visit (HOSPITAL_COMMUNITY)
Admission: RE | Admit: 2019-11-22 | Discharge: 2019-11-22 | Disposition: A | Discharge status: Home / Self Care | Payer: PPO | Source: Ambulatory Visit | Attending: Cardiology | Admitting: Cardiology

## 2019-11-22 ENCOUNTER — Telehealth: Payer: Self-pay | Admitting: Cardiology

## 2019-11-22 DIAGNOSIS — Z880 Allergy status to penicillin: Secondary | ICD-10-CM | POA: Diagnosis not present

## 2019-11-22 DIAGNOSIS — E1122 Type 2 diabetes mellitus with diabetic chronic kidney disease: Secondary | ICD-10-CM | POA: Diagnosis present

## 2019-11-22 DIAGNOSIS — E039 Hypothyroidism, unspecified: Secondary | ICD-10-CM | POA: Diagnosis present

## 2019-11-22 DIAGNOSIS — I130 Hypertensive heart and chronic kidney disease with heart failure and stage 1 through stage 4 chronic kidney disease, or unspecified chronic kidney disease: Secondary | ICD-10-CM | POA: Diagnosis present

## 2019-11-22 DIAGNOSIS — Z7901 Long term (current) use of anticoagulants: Secondary | ICD-10-CM | POA: Diagnosis not present

## 2019-11-22 DIAGNOSIS — H409 Unspecified glaucoma: Secondary | ICD-10-CM | POA: Diagnosis present

## 2019-11-22 DIAGNOSIS — I5032 Chronic diastolic (congestive) heart failure: Secondary | ICD-10-CM | POA: Diagnosis not present

## 2019-11-22 DIAGNOSIS — I509 Heart failure, unspecified: Secondary | ICD-10-CM | POA: Diagnosis not present

## 2019-11-22 DIAGNOSIS — I340 Nonrheumatic mitral (valve) insufficiency: Secondary | ICD-10-CM | POA: Diagnosis not present

## 2019-11-22 DIAGNOSIS — N179 Acute kidney failure, unspecified: Secondary | ICD-10-CM | POA: Diagnosis present

## 2019-11-22 DIAGNOSIS — I4892 Unspecified atrial flutter: Secondary | ICD-10-CM | POA: Diagnosis present

## 2019-11-22 DIAGNOSIS — E785 Hyperlipidemia, unspecified: Secondary | ICD-10-CM | POA: Diagnosis present

## 2019-11-22 DIAGNOSIS — Z452 Encounter for adjustment and management of vascular access device: Secondary | ICD-10-CM | POA: Diagnosis not present

## 2019-11-22 DIAGNOSIS — N183 Chronic kidney disease, stage 3 unspecified: Secondary | ICD-10-CM | POA: Diagnosis present

## 2019-11-22 DIAGNOSIS — J189 Pneumonia, unspecified organism: Secondary | ICD-10-CM | POA: Diagnosis not present

## 2019-11-22 DIAGNOSIS — I480 Paroxysmal atrial fibrillation: Secondary | ICD-10-CM | POA: Diagnosis present

## 2019-11-22 DIAGNOSIS — Z6841 Body Mass Index (BMI) 40.0 and over, adult: Secondary | ICD-10-CM | POA: Diagnosis not present

## 2019-11-22 DIAGNOSIS — Z7984 Long term (current) use of oral hypoglycemic drugs: Secondary | ICD-10-CM | POA: Diagnosis not present

## 2019-11-22 DIAGNOSIS — I4891 Unspecified atrial fibrillation: Secondary | ICD-10-CM | POA: Diagnosis not present

## 2019-11-22 DIAGNOSIS — M25572 Pain in left ankle and joints of left foot: Secondary | ICD-10-CM | POA: Diagnosis present

## 2019-11-22 DIAGNOSIS — Z20822 Contact with and (suspected) exposure to covid-19: Secondary | ICD-10-CM | POA: Diagnosis present

## 2019-11-22 DIAGNOSIS — Z01812 Encounter for preprocedural laboratory examination: Secondary | ICD-10-CM | POA: Insufficient documentation

## 2019-11-22 DIAGNOSIS — I110 Hypertensive heart disease with heart failure: Secondary | ICD-10-CM | POA: Diagnosis not present

## 2019-11-22 DIAGNOSIS — M7989 Other specified soft tissue disorders: Secondary | ICD-10-CM | POA: Diagnosis not present

## 2019-11-22 DIAGNOSIS — I5033 Acute on chronic diastolic (congestive) heart failure: Secondary | ICD-10-CM | POA: Diagnosis present

## 2019-11-22 DIAGNOSIS — Z79899 Other long term (current) drug therapy: Secondary | ICD-10-CM | POA: Diagnosis not present

## 2019-11-22 LAB — SARS CORONAVIRUS 2 (TAT 6-24 HRS): SARS Coronavirus 2: NEGATIVE

## 2019-11-22 NOTE — Telephone Encounter (Signed)
Pt is overdue for follow-up.  Last seen in [facility] 11/01/19 INR 6.7, pt was scheduled to return on 11/07/19, but never followed back up.  Pt is very non-compliant, prior to 4/20 check pt had not been checked since 07/05/19. INR 2.5 checked on 5/5/2, pt is pending DCCV/TEE on Thurs 11/24/19. Pt states she only has 1 tablet of Warfarin left, advised pt I will refill rx with enough tablets to get her to her post DCCV appt in the [facility], but she must keep that appt in order for her to receive future refills.

## 2019-11-22 NOTE — Telephone Encounter (Signed)
Pt is overdue for follow-up.  Last seen in [facility] 11/01/19 INR 6.7, pt was scheduled to return on 11/07/19, but never followed back up.  Pt is very non-compliant, prior to 4/20 check pt had not been checked since 07/05/19. INR 2.5 checked on 5/5/2, pt is pending DCCV/TEE on Thurs 11/24/19. Pt states she only has 1 tablet of Warfarin left, advised pt I will refill rx with enough tablets to get her to her post DCCV appt in the [facility], but she must keep that appt in order for her to receive future refills. Will refill rx in refill request from pharmacy on 11/20/19.

## 2019-11-22 NOTE — Telephone Encounter (Signed)
New message   Pt c/o medication issue:  1. Name of Medication: warfarin (COUMADIN) 5 MG tablet  2. How are you currently taking this medication (dosage and times per day)? As written  3. Are you having a reaction (difficulty breathing--STAT)? No   4. What is your medication issue? Patient needs a new prescription for this medication sent to [facility] [address] [place]

## 2019-11-24 ENCOUNTER — Encounter (HOSPITAL_COMMUNITY): Payer: Self-pay | Admitting: Cardiology

## 2019-11-24 ENCOUNTER — Other Ambulatory Visit: Payer: Self-pay

## 2019-11-24 ENCOUNTER — Inpatient Hospital Stay (HOSPITAL_COMMUNITY)
Admission: RE | Admit: 2019-11-24 | Discharge: 2019-11-30 | DRG: 291 | Disposition: A | Discharge status: Home / Self Care | Payer: PPO | Attending: Cardiology | Admitting: Cardiology

## 2019-11-24 ENCOUNTER — Ambulatory Visit (HOSPITAL_COMMUNITY): Payer: PPO | Admitting: Certified Registered Nurse Anesthetist

## 2019-11-24 ENCOUNTER — Telehealth (HOSPITAL_COMMUNITY): Payer: Self-pay | Admitting: Vascular Surgery

## 2019-11-24 ENCOUNTER — Encounter (HOSPITAL_COMMUNITY)
Admission: RE | Disposition: A | Discharge status: Home / Self Care | Payer: Self-pay | Source: Home / Self Care | Attending: Cardiology

## 2019-11-24 ENCOUNTER — Ambulatory Visit (HOSPITAL_COMMUNITY)
Admission: RE | Admit: 2019-11-24 | Discharge: 2019-11-24 | Disposition: A | Discharge status: Home / Self Care | Payer: PPO | Source: Ambulatory Visit | Attending: Cardiology | Admitting: Cardiology

## 2019-11-24 DIAGNOSIS — I340 Nonrheumatic mitral (valve) insufficiency: Secondary | ICD-10-CM | POA: Diagnosis not present

## 2019-11-24 DIAGNOSIS — M25572 Pain in left ankle and joints of left foot: Secondary | ICD-10-CM | POA: Diagnosis present

## 2019-11-24 DIAGNOSIS — E1122 Type 2 diabetes mellitus with diabetic chronic kidney disease: Secondary | ICD-10-CM | POA: Diagnosis present

## 2019-11-24 DIAGNOSIS — Z452 Encounter for adjustment and management of vascular access device: Secondary | ICD-10-CM

## 2019-11-24 DIAGNOSIS — Z880 Allergy status to penicillin: Secondary | ICD-10-CM | POA: Diagnosis not present

## 2019-11-24 DIAGNOSIS — N183 Chronic kidney disease, stage 3 unspecified: Secondary | ICD-10-CM | POA: Diagnosis present

## 2019-11-24 DIAGNOSIS — Z7901 Long term (current) use of anticoagulants: Secondary | ICD-10-CM | POA: Diagnosis not present

## 2019-11-24 DIAGNOSIS — Z79899 Other long term (current) drug therapy: Secondary | ICD-10-CM

## 2019-11-24 DIAGNOSIS — I480 Paroxysmal atrial fibrillation: Secondary | ICD-10-CM | POA: Diagnosis present

## 2019-11-24 DIAGNOSIS — I4891 Unspecified atrial fibrillation: Secondary | ICD-10-CM

## 2019-11-24 DIAGNOSIS — N179 Acute kidney failure, unspecified: Secondary | ICD-10-CM | POA: Diagnosis present

## 2019-11-24 DIAGNOSIS — Z6841 Body Mass Index (BMI) 40.0 and over, adult: Secondary | ICD-10-CM

## 2019-11-24 DIAGNOSIS — Z20822 Contact with and (suspected) exposure to covid-19: Secondary | ICD-10-CM | POA: Diagnosis present

## 2019-11-24 DIAGNOSIS — E039 Hypothyroidism, unspecified: Secondary | ICD-10-CM | POA: Diagnosis present

## 2019-11-24 DIAGNOSIS — I5033 Acute on chronic diastolic (congestive) heart failure: Secondary | ICD-10-CM | POA: Diagnosis present

## 2019-11-24 DIAGNOSIS — M25579 Pain in unspecified ankle and joints of unspecified foot: Secondary | ICD-10-CM

## 2019-11-24 DIAGNOSIS — I130 Hypertensive heart and chronic kidney disease with heart failure and stage 1 through stage 4 chronic kidney disease, or unspecified chronic kidney disease: Principal | ICD-10-CM | POA: Diagnosis present

## 2019-11-24 DIAGNOSIS — E785 Hyperlipidemia, unspecified: Secondary | ICD-10-CM | POA: Diagnosis present

## 2019-11-24 DIAGNOSIS — E119 Type 2 diabetes mellitus without complications: Secondary | ICD-10-CM

## 2019-11-24 DIAGNOSIS — I4892 Unspecified atrial flutter: Secondary | ICD-10-CM | POA: Diagnosis present

## 2019-11-24 DIAGNOSIS — I110 Hypertensive heart disease with heart failure: Secondary | ICD-10-CM | POA: Diagnosis not present

## 2019-11-24 DIAGNOSIS — H409 Unspecified glaucoma: Secondary | ICD-10-CM | POA: Diagnosis present

## 2019-11-24 DIAGNOSIS — Z7984 Long term (current) use of oral hypoglycemic drugs: Secondary | ICD-10-CM

## 2019-11-24 DIAGNOSIS — I5032 Chronic diastolic (congestive) heart failure: Secondary | ICD-10-CM

## 2019-11-24 HISTORY — PX: TEE WITHOUT CARDIOVERSION: SHX5443

## 2019-11-24 HISTORY — PX: CARDIOVERSION: SHX1299

## 2019-11-24 LAB — POCT I-STAT, CHEM 8
BUN: 39 mg/dL — ABNORMAL HIGH (ref 8–23)
Calcium, Ion: 1.04 mmol/L — ABNORMAL LOW (ref 1.15–1.40)
Chloride: 100 mmol/L (ref 98–111)
Creatinine, Ser: 1.6 mg/dL — ABNORMAL HIGH (ref 0.44–1.00)
Glucose, Bld: 116 mg/dL — ABNORMAL HIGH (ref 70–99)
HCT: 28 % — ABNORMAL LOW (ref 36.0–46.0)
Hemoglobin: 9.5 g/dL — ABNORMAL LOW (ref 12.0–15.0)
Potassium: 4.6 mmol/L (ref 3.5–5.1)
Sodium: 138 mmol/L (ref 135–145)
TCO2: 33 mmol/L — ABNORMAL HIGH (ref 22–32)

## 2019-11-24 LAB — BASIC METABOLIC PANEL
Anion gap: 13 (ref 5–15)
BUN: 29 mg/dL — ABNORMAL HIGH (ref 8–23)
CO2: 29 mmol/L (ref 22–32)
Calcium: 8.8 mg/dL — ABNORMAL LOW (ref 8.9–10.3)
Chloride: 98 mmol/L (ref 98–111)
Creatinine, Ser: 1.64 mg/dL — ABNORMAL HIGH (ref 0.44–1.00)
GFR calc Af Amer: 36 mL/min — ABNORMAL LOW (ref >60)
GFR calc non Af Amer: 31 mL/min — ABNORMAL LOW (ref >60)
Glucose, Bld: 133 mg/dL — ABNORMAL HIGH (ref 70–99)
Potassium: 3.7 mmol/L (ref 3.5–5.1)
Sodium: 140 mmol/L (ref 135–145)

## 2019-11-24 LAB — PROTIME-INR
INR: 2.3 — ABNORMAL HIGH (ref 0.8–1.2)
Prothrombin Time: 24.8 seconds — ABNORMAL HIGH (ref 11.4–15.2)

## 2019-11-24 SURGERY — ECHOCARDIOGRAM, TRANSESOPHAGEAL
Anesthesia: General

## 2019-11-24 MED ORDER — SODIUM CHLORIDE 0.9 % IV SOLN: INTRAVENOUS | Status: DC | PRN

## 2019-11-24 MED ORDER — PROPOFOL 10 MG/ML IV BOLUS
INTRAVENOUS | Status: DC | PRN
  Administered 2019-11-24: 10 mg via INTRAVENOUS

## 2019-11-24 MED ORDER — TRAZODONE HCL 50 MG PO TABS
50.0000 mg | ORAL_TABLET | Freq: Once | ORAL | Status: DC
  Filled 2019-11-24: qty 1

## 2019-11-24 MED ORDER — WARFARIN - PHARMACIST DOSING INPATIENT: Freq: Every day | Status: DC

## 2019-11-24 MED ORDER — POTASSIUM CHLORIDE CRYS ER 20 MEQ PO TBCR
20.0000 meq | EXTENDED_RELEASE_TABLET | Freq: Two times a day (BID) | ORAL | Status: DC
  Administered 2019-11-24: 20 meq via ORAL
  Filled 2019-11-24 (×2): qty 1

## 2019-11-24 MED ORDER — AMIODARONE HCL 200 MG PO TABS
200.0000 mg | ORAL_TABLET | Freq: Every day | ORAL | Status: DC
  Administered 2019-11-25: 200 mg via ORAL
  Filled 2019-11-24 (×4): qty 1

## 2019-11-24 MED ORDER — SODIUM CHLORIDE 0.9 % IV SOLN: 250.0000 mL | INTRAVENOUS | Status: DC | PRN

## 2019-11-24 MED ORDER — WARFARIN SODIUM 5 MG PO TABS: 5.0000 mg | ORAL_TABLET | Freq: Once | ORAL | Status: DC

## 2019-11-24 MED ORDER — ROSUVASTATIN CALCIUM 5 MG PO TABS
5.0000 mg | ORAL_TABLET | Freq: Every day | ORAL | Status: DC
  Administered 2019-11-25 – 2019-11-30 (×6): 5 mg via ORAL
  Filled 2019-11-24 (×7): qty 1

## 2019-11-24 MED ORDER — BUSPIRONE HCL 5 MG PO TABS
15.0000 mg | ORAL_TABLET | Freq: Three times a day (TID) | ORAL | Status: DC
  Administered 2019-11-24 – 2019-11-30 (×18): 15 mg via ORAL
  Filled 2019-11-24 (×18): qty 3

## 2019-11-24 MED ORDER — MONTELUKAST SODIUM 10 MG PO TABS
10.0000 mg | ORAL_TABLET | Freq: Every evening | ORAL | Status: DC
  Administered 2019-11-24 – 2019-11-29 (×6): 10 mg via ORAL
  Filled 2019-11-24 (×6): qty 1

## 2019-11-24 MED ORDER — LEVOTHYROXINE SODIUM 75 MCG PO TABS
75.0000 ug | ORAL_TABLET | Freq: Every day | ORAL | Status: DC
  Administered 2019-11-25 – 2019-11-30 (×6): 75 ug via ORAL
  Filled 2019-11-24 (×6): qty 1

## 2019-11-24 MED ORDER — PROPOFOL 500 MG/50ML IV EMUL
INTRAVENOUS | Status: DC | PRN
  Administered 2019-11-24: 100 ug/kg/min via INTRAVENOUS

## 2019-11-24 MED ORDER — SODIUM CHLORIDE 0.9% FLUSH: 3.0000 mL | INTRAVENOUS | Status: DC | PRN

## 2019-11-24 MED ORDER — METFORMIN HCL 500 MG PO TABS
500.0000 mg | ORAL_TABLET | Freq: Every day | ORAL | Status: DC
  Administered 2019-11-25 – 2019-11-29 (×5): 500 mg via ORAL
  Filled 2019-11-24 (×5): qty 1

## 2019-11-24 MED ORDER — ALPRAZOLAM 0.25 MG PO TABS
0.2500 mg | ORAL_TABLET | Freq: Once | ORAL | Status: AC
  Administered 2019-11-24: 0.25 mg via ORAL
  Filled 2019-11-24: qty 1

## 2019-11-24 MED ORDER — SODIUM CHLORIDE 0.9 % IV SOLN
INTRAVENOUS | Status: DC
  Administered 2019-11-24: 500 mL via INTRAVENOUS

## 2019-11-24 MED ORDER — ONDANSETRON HCL 4 MG/2ML IJ SOLN: 4.0000 mg | Freq: Four times a day (QID) | INTRAMUSCULAR | Status: DC | PRN

## 2019-11-24 MED ORDER — LORATADINE 10 MG PO TABS
10.0000 mg | ORAL_TABLET | Freq: Every evening | ORAL | Status: DC
  Administered 2019-11-24 – 2019-11-29 (×6): 10 mg via ORAL
  Filled 2019-11-24 (×6): qty 1

## 2019-11-24 MED ORDER — FUROSEMIDE 10 MG/ML IJ SOLN
80.0000 mg | Freq: Two times a day (BID) | INTRAMUSCULAR | Status: DC
  Administered 2019-11-24 – 2019-11-26 (×4): 80 mg via INTRAVENOUS
  Filled 2019-11-24 (×4): qty 8

## 2019-11-24 MED ORDER — DULOXETINE HCL 60 MG PO CPEP
90.0000 mg | ORAL_CAPSULE | Freq: Every day | ORAL | Status: DC
  Administered 2019-11-24 – 2019-11-30 (×6): 90 mg via ORAL
  Filled 2019-11-24 (×6): qty 1

## 2019-11-24 MED ORDER — SODIUM CHLORIDE 0.9% FLUSH
3.0000 mL | Freq: Two times a day (BID) | INTRAVENOUS | Status: DC
  Administered 2019-11-24 – 2019-11-29 (×5): 3 mL via INTRAVENOUS

## 2019-11-24 MED ORDER — LEVOCETIRIZINE DIHYDROCHLORIDE 5 MG PO TABS: 5.0000 mg | ORAL_TABLET | Freq: Every evening | ORAL | Status: DC

## 2019-11-24 MED ORDER — ACETAMINOPHEN 325 MG PO TABS
650.0000 mg | ORAL_TABLET | ORAL | Status: DC | PRN
  Administered 2019-11-24 – 2019-11-29 (×5): 650 mg via ORAL
  Filled 2019-11-24 (×5): qty 2

## 2019-11-24 NOTE — H&P (Addendum)
Advanced Heart Failure Team History and Physical Note   PCP:  Lujean Amel, MD  PCP-Cardiology: Will Meredith Leeds, MD     Reason for Admission: A/C Diastolic HF    HPI:    [name] is a 74 year old with a history of a fib and diastolic heart failure. She was first diagnosed with atrial fibrillation in [place], had cardioversion.  She says that she has been on amiodarone for several years.   Recently referred to Dr Aundra Dubin in May for diastolic heart failure due ongoing volume overload. She was seen on 11/16/19 and was volume overloaded so torsemide was increased to 60/40 and also given dose of metolazone. Also set up for cardioversion.   Today she presented for scheduled TEE/DC-CV. Converted to NSR but had marked volume overload. Admitted.   SAR2 Negative on 11/22/19  Review of Systems: [y] = yes, [ ]  = no   General: Weight gain [Y ]; Weight loss [ ] ; Anorexia [ ] ; Fatigue [Y ]; Fever [ ] ; Chills [ ] ; Weakness [ ]   Cardiac: Chest pain/pressure [ ] ; Resting SOB [ ] ; Exertional SOB [ ] ; Orthopnea [ ] ; Pedal Edema [ ] ; Palpitations [ ] ; Syncope [ ] ; Presyncope [ ] ; Paroxysmal nocturnal dyspnea[ ]   Pulmonary: Cough [ ] ; Wheezing[ ] ; Hemoptysis[ ] ; Sputum [ ] ; Snoring [ ]   GI: Vomiting[ ] ; Dysphagia[ ] ; Melena[ ] ; Hematochezia [ ] ; Heartburn[ ] ; Abdominal pain [ ] ; Constipation [ ] ; Diarrhea [ ] ; BRBPR [ ]   GU: Hematuria[ ] ; Dysuria [ ] ; Nocturia[ ]   Vascular: Pain in legs with walking [ ] ; Pain in feet with lying flat [ ] ; Non-healing sores [ ] ; Stroke [ ] ; TIA [ ] ; Slurred speech [ ] ;  Neuro: Headaches[ ] ; Vertigo[ ] ; Seizures[ ] ; Paresthesias[ ] ;Blurred vision [ ] ; Diplopia [ ] ; Vision changes [ ]   Ortho/Skin: Arthritis [ ] ; Joint pain [ ] ; Muscle pain [ ] ; Joint swelling [ ] ; Back Pain [ ] ; Rash [ ]   Psych: Depression[ ] ; Anxiety[Y ]  Heme: Bleeding problems [ ] ; Clotting disorders [ ] ; Anemia [ ]   Endocrine: Diabetes [ Y]; Thyroid dysfunction[Y ]   Home Medications Prior to  Admission medications   Medication Sig Start Date End Date Taking? Authorizing Provider  amiodarone (PACERONE) 200 MG tablet Take 1 tablet (200 mg total) by mouth daily. 11/01/19  Yes Camnitz, Will Hassell Done, MD  busPIRone (BUSPAR) 15 MG tablet TAKE 1 TABLET THREE TIMES A DAY 12/24/17  Yes Eksir, Richard Miu, MD  DULoxetine (CYMBALTA) 30 MG capsule Take 3 capsules (90 mg total) by mouth daily. 11/05/17  Yes Eksir, Richard Miu, MD  levocetirizine (XYZAL) 5 MG tablet Take 5 mg by mouth every evening.   Yes [provider]  metFORMIN (GLUCOPHAGE) 500 MG tablet Take 500 mg by mouth daily with breakfast.   Yes [provider]  metolazone (ZAROXOLYN) 2.5 MG tablet TAKE ONE TABLET BY MOUTH DAILY AS NEEDED 11/04/19  Yes Camnitz, Will Hassell Done, MD  montelukast (SINGULAIR) 10 MG tablet Take 10 mg by mouth every evening.   Yes [provider]  Potassium Chloride ER 20 MEQ TBCR Take 20 mEq by mouth 2 (two) times daily. Take extra 20 meq on days she takes extra diuretic. 11/16/19  Yes Larey Dresser, MD  rosuvastatin (CRESTOR) 5 MG tablet Take 5 mg by mouth daily. 12/23/17  Yes [provider]  torsemide (DEMADEX) 20 MG tablet Take 3 tablets (60 mg total) by mouth every morning AND 2  tablets (40 mg total) every evening. Take extra as needed or directed.. 11/16/19  Yes Larey Dresser, MD  traZODone (DESYREL) 100 MG tablet Take 2 tablets (200 mg total) by mouth at bedtime. 11/05/17  Yes Eksir, Richard Miu, MD  triamcinolone ointment (KENALOG) 0.1 % Apply BID to legs 2 weeks on 1 week off, repeat 11/14/19  Yes [provider]  warfarin (COUMADIN) 5 MG tablet Take as directed by [facility]. Must keep [facility] appt for future refills. 11/22/19  Yes Camnitz, Will Hassell Done, MD  levothyroxine (SYNTHROID, LEVOTHROID) 75 MCG tablet Take 75 mcg by mouth daily. 03/03/18   [provider]  [facility] powder as directed. 03/19/18   [provider]    Past Medical  History: Past Medical History:  Diagnosis Date  . Atrial fibrillation ([place])   . Diabetes mellitus without complication ([place])   . Glaucoma    BILATERAL  . Hypertension   . Thyroid disease     Past Surgical History: Past Surgical History:  Procedure Laterality Date  . APPENDECTOMY    . CHOLECYSTECTOMY    . HERNIA REPAIR     UMBILICAL  . LASIK     BILATERAL    Family History:  Family History  Problem Relation Age of Onset  . Lung cancer Mother   . Colon cancer Father   . Heart attack Brother   . Heart disease Brother   . Lung cancer Maternal Grandfather   . Heart attack Paternal Grandfather     Social History: Social History   Socioeconomic History  . Marital status: Legally Separated    Spouse name: Not on file  . Number of children: Not on file  . Years of education: Not on file  . Highest education level: Not on file  Occupational History  . Not on file  Tobacco Use  . Smoking status: Never Smoker  . Smokeless tobacco: Never Used  Substance and Sexual Activity  . Alcohol use: Yes    Alcohol/week: 1.0 standard drinks    Types: 1 Glasses of wine per week    Comment: occ  . Drug use: No  . Sexual activity: Never  Other Topics Concern  . Not on file  Social History Narrative  . Not on file   Social Determinants of Health   Financial Resource Strain:   . Difficulty of Paying Living Expenses:   Food Insecurity:   . Worried About Charity fundraiser in the Last Year:   . Arboriculturist in the Last Year:   Transportation Needs:   . Film/video editor (Medical):   Marland Kitchen Lack of Transportation (Non-Medical):   Physical Activity:   . Days of Exercise per Week:   . Minutes of Exercise per Session:   Stress:   . Feeling of Stress :   Social Connections:   . Frequency of Communication with Friends and Family:   . Frequency of Social Gatherings with Friends and Family:   . Attends Religious Services:   . Active Member of Clubs or Organizations:   .  Attends Archivist Meetings:   Marland Kitchen Marital Status:     Allergies:  Allergies  Allergen Reactions  . Penicillins     Possibility; father and brother allergic     Objective:    Vital Signs:   Pulse Rate:  [95-62] 13 (05/13 1305) Resp:  [15-17] 17 (05/13 1305) BP: (130-133)/(57) 130/57 (05/13 1305) SpO2:  [94 %-96 %] 94 % (05/13 1305) Weight:  [  149.7 kg] 149.7 kg (05/13 1207)   Filed Weights   11/24/19 1207  Weight: (!) 149.7 kg     Physical Exam     General:  . No respiratory difficulty HEENT: Normal Neck: Supple. JVP to jaw . Carotids 2+ bilat; no bruits. No lymphadenopathy or thyromegaly appreciated. Cor: PMI nondisplaced. Regular rate & rhythm. No rubs, gallops or murmurs. Lungs: Clear Abdomen: Soft, nontender, nondistended. No hepatosplenomegaly. No bruits or masses. Good bowel sounds. Extremities: No cyanosis, clubbing, rash, R and LLE 2+ edema Neuro: Alert & oriented x 3, cranial nerves grossly intact. moves all 4 extremities w/o difficulty. Affect pleasant.   Telemetry  NSR   EKG   A fib pre cardioversion Labs     Basic Metabolic Panel: Recent Labs  Lab 11/24/19 1223  NA 138  K 4.6  CL 100  GLUCOSE 116*  BUN 39*  CREATININE 1.60*    Liver Function Tests: No results for input(s): AST, ALT, ALKPHOS, BILITOT, PROT, ALBUMIN in the last 168 hours. No results for input(s): LIPASE, AMYLASE in the last 168 hours. No results for input(s): AMMONIA in the last 168 hours.  CBC: Recent Labs  Lab 11/24/19 1223  HGB 9.5*  HCT 28.0*    Cardiac Enzymes: No results for input(s): CKTOTAL, CKMB, CKMBINDEX, TROPONINI in the last 168 hours.  BNP: BNP (last 3 results) No results for input(s): BNP in the last 8760 hours.  ProBNP (last 3 results) No results for input(s): PROBNP in the last 8760 hours.   CBG: No results for input(s): GLUCAP in the last 168 hours.  Coagulation Studies: No results for input(s): LABPROT, INR in the last 72  hours.  Imaging:  No results found.   Patient Profile   [name] is a 74 year old with a history of a fib and diastolic heart failure. \  Admitted post cardioversion with marked volume overload.   Assessment/Plan  1. A/C Diastolic HF -TEE EF 44-81% today. No clot  -Marked volume overload.  - Start 80 mg IV lasix twice a day  - Follow renal function closely   2. PAF S/P TEE/DC-CV with conversion to NSR - Continue amio 200 mg daily (home dose)  -On coumadin .  -Consult pharmacy to manage INR  3. Suspected OSA Home sleep study has been ordered  4. DMII On metformin 500 mg daily - Follow CBGs AC/HS - Check Hgb A1C  5. Hypothyroidism  TSH on 11/01/19 was 11 Check TSH, free t3, free t4 in am  On levothyroxine continue current dose and adjust based on thyroid panel.    Darrick Grinder, NP 11/24/2019, 1:19 PM  Advanced Heart Failure Team Pager [phone] (M-F; [place])  Please contact [facility] for night-coverage after hours (4p -7a ) and weekends on [email or web address]  Patient seen with NP, agree with the above note.    TEE-guided DCCV done today, now back in NSR.   TEE: Normal left ventricular size and thickness, EF 60-65% with normal wall motion.  The right ventricle was mildly dilated with normal systolic function.  There was a D-shaped interventricular septum consistent with RV pressure/volume overload.  The left atrium was moderately dilated, no LA appendage thrombus. Normal right atrium.  No evidence for PFO/ASD by color doppler.  There was moderate mitral regurgitation, ERO 0.19 cm^2 by PISA.  No systolic flow reversal on the pulmonary vein doppler pattern.  Trileaflet aortic valve with no stenosis or regurgitation.  Moderate TR, peak RV-RA gradient 36 mmHg.  She remains markedly volume overloaded with NYHA class IIIb symptoms.  Discussed with patient and daughter, will bring in hospital for IV diuresis as she has not responded adequately to increased po diuretics.    General: NAD Neck: JVP 14+ cm, no thyromegaly or thyroid nodule.  Lungs: Decreased at bases.  CV: Nonpalpable PMI.  Heart regular S1/S2, no S3/S4, no murmur.  2+ edema to thighs.  No carotid bruit.  Pedal pulses not palpable due to swelling. .  Abdomen: Soft, nontender, no hepatosplenomegaly, mild distention.  Skin: Intact without lesions or rashes.  Neurologic: Alert and oriented x 3.  Psych: Normal affect. Extremities: No clubbing or cyanosis.  HEENT: Normal.   I will admit patient today, start Lasix 80 mg IV bid and replace K.    Creatinine 1.6, will need to follow carefully.   Continue amiodarone and warfarin.  She is currently in NSR.   Loralie Champagne 11/24/2019 4:34 PM

## 2019-11-24 NOTE — Anesthesia Preprocedure Evaluation (Signed)
Anesthesia Evaluation  Patient identified by MRN, date of birth, ID band Patient awake    Reviewed: Allergy & Precautions, NPO status , Patient's Chart, lab work & pertinent test results  History of Anesthesia Complications Negative for: history of anesthetic complications  Airway Mallampati: III  TM Distance: >3 FB Neck ROM: Full    Dental  (+) Poor Dentition, Chipped, Missing   Pulmonary neg shortness of breath, neg sleep apnea, neg COPD, neg recent URI,    breath sounds clear to auscultation       Cardiovascular hypertension, +CHF  + dysrhythmias Atrial Fibrillation  Rhythm:Irregular   - Left ventricle: The cavity size was mildly dilated. Systolic  function was normal. The estimated ejection fraction was in the  range of 60% to 65%. Wall motion was normal; there were no  regional wall motion abnormalities. Left ventricular diastolic  function parameters were normal.  - Left atrium: The atrium was severely dilated.  - Atrial septum: There was increased thickness of the septum,  consistent with lipomatous hypertrophy.   Neuro/Psych PSYCHIATRIC DISORDERS Anxiety negative neurological ROS     GI/Hepatic negative GI ROS, Neg liver ROS,   Endo/Other  diabetesHypothyroidism Morbid obesity  Renal/GU      Musculoskeletal   Abdominal   Peds  Hematology   Anesthesia Other Findings   Reproductive/Obstetrics                             Anesthesia Physical Anesthesia Plan  ASA: III  Anesthesia Plan: General   Post-op Pain Management:    Induction: Intravenous  PONV Risk Score and Plan: 3  Airway Management Planned: Nasal Cannula  Additional Equipment: None  Intra-op Plan:   Post-operative Plan:   Informed Consent: I have reviewed the patients History and Physical, chart, labs and discussed the procedure including the risks, benefits and alternatives for the proposed  anesthesia with the patient or authorized representative who has indicated his/her understanding and acceptance.     Dental advisory given  Plan Discussed with: CRNA  Anesthesia Plan Comments:         Anesthesia Quick Evaluation

## 2019-11-24 NOTE — Transfer of Care (Signed)
Immediate Anesthesia Transfer of Care Note  Patient: Hannah [name]  Procedure(s) Performed: TRANSESOPHAGEAL ECHOCARDIOGRAM (TEE) (N/A ) CARDIOVERSION (N/A )  Patient Location: Endoscopy Unit  Anesthesia Type:General  Level of Consciousness: awake, alert  and oriented  Airway & Oxygen Therapy: Patient Spontanous Breathing and Patient connected to nasal cannula oxygen  Post-op Assessment: Report given to RN, Post -op Vital signs reviewed and stable and Patient moving all extremities X 4  Post vital signs: Reviewed and stable  Last Vitals:  Vitals Value Taken Time  BP    Temp    Pulse 77 11/24/19 1305  Resp 17 11/24/19 1305  SpO2 94 % 11/24/19 1305  Vitals shown include unvalidated device data.  Last Pain:  Vitals:   11/24/19 1207  TempSrc: Oral  PainSc: 5          Complications: No apparent anesthesia complications

## 2019-11-24 NOTE — Anesthesia Procedure Notes (Signed)
Procedure Name: MAC Date/Time: 11/24/2019 12:35 PM Performed by: Harden Mo, CRNA Pre-anesthesia Checklist: Patient identified, Emergency Drugs available, Suction available and Patient being monitored Patient Re-evaluated:Patient Re-evaluated prior to induction Oxygen Delivery Method: Nasal cannula Preoxygenation: Pre-oxygenation with 100% oxygen Induction Type: IV induction Placement Confirmation: positive ETCO2 and breath sounds checked- equal and bilateral Dental Injury: Teeth and Oropharynx as per pre-operative assessment

## 2019-11-24 NOTE — CV Procedure (Signed)
Procedure: TEE  Indication: Atrial fibrillation  Sedation: Per anesthesiology  Findings: Please see echo section for full report.  Normal left ventricular size and thickness, EF 60-65% with normal wall motion.  The right ventricle was mildly dilated with normal systolic function.  There was a D-shaped interventricular septum consistent with RV pressure/volume overload.  The left atrium was moderately dilated, no LA appendage thrombus. Normal right atrium.  No evidence for PFO/ASD by color doppler.  There was moderate mitral regurgitation, ERO 0.19 cm^2 by PISA.  No systolic flow reversal on the pulmonary vein doppler pattern.  Trileaflet aortic valve with no stenosis or regurgitation.  Moderate TR, peak RV-RA gradient 36 mmHg.    No LAA thrombus, may proceed with DCCV.   Hannah [name] 11/24/2019 1:02 PM

## 2019-11-24 NOTE — Procedures (Signed)
Electrical Cardioversion Procedure Note Denessa [name] [id number] 06/06/1946  Procedure: Electrical Cardioversion Indications:  Atrial Fibrillation  Procedure Details Consent: Risks of procedure as well as the alternatives and risks of each were explained to the (patient/caregiver).  Consent for procedure obtained. Time Out: Verified patient identification, verified procedure, site/side was marked, verified correct patient position, special equipment/implants available, medications/allergies/relevent history reviewed, required imaging and test results available.  Performed  Patient placed on cardiac monitor, pulse oximetry, supplemental oxygen as necessary.  Sedation given: Per anesthesiology Pacer pads placed anterior and posterior chest.  Cardioverted 1 time(s).  Cardioverted at [place].  Evaluation Findings: Post procedure EKG shows: NSR Complications: None Patient did tolerate procedure well.   Loralie Champagne 11/24/2019, 1:02 PM

## 2019-11-24 NOTE — Progress Notes (Signed)
  Echocardiogram 2D Echocardiogram has been performed.  Hannah [name] A [name] [name] 11/24/2019, 1:20 PM

## 2019-11-24 NOTE — Progress Notes (Signed)
ANTICOAGULATION CONSULT NOTE - Follow Up Consult  Pharmacy Consult for Warfarin Indication: atrial fibrillation  Allergies  Allergen Reactions  . Penicillins     Possibility; father and brother allergic     Patient Measurements: Height: 5\' 6"  (167.6 cm) Weight: (!) 149.7 kg (330 lb) IBW/kg (Calculated) : 59.3  Vital Signs: Temp: 97.7 F (36.5 C) (05/13 1514) Temp Source: Oral (05/13 1514) BP: 122/68 (05/13 1514) Pulse Rate: 75 (05/13 1539)  Labs: Recent Labs    11/24/19 1223 11/24/19 1455  HGB 9.5*  --   HCT 28.0*  --   LABPROT  --  24.8*  INR  --  2.3*  CREATININE 1.60* 1.64*    Estimated Creatinine Clearance: 46.7 mL/min (A) (by C-G formula based on SCr of 1.64 mg/dL (H)).  Assessment: 74 year old female on warfarin prior to admission for Afib Admit INR 2.3 Warfarin dose prior to admission 5 mg daily except 2.5 mg on Sunday  Goal of Therapy:  INR 2-3 Monitor platelets by anticoagulation protocol: Yes   Plan:  Warfarin 5 mg po x 1 dose today Daily INR  Thank you Anette Guarneri, PharmD  11/24/2019,4:00 PM

## 2019-11-24 NOTE — Telephone Encounter (Signed)
Returned pt call, pt left message on scheduling line, pt wanted to know when next appt w. [facility], left pt message giving that appt date and time

## 2019-11-25 LAB — BASIC METABOLIC PANEL
Anion gap: 12 (ref 5–15)
BUN: 29 mg/dL — ABNORMAL HIGH (ref 8–23)
CO2: 29 mmol/L (ref 22–32)
Calcium: 8.6 mg/dL — ABNORMAL LOW (ref 8.9–10.3)
Chloride: 99 mmol/L (ref 98–111)
Creatinine, Ser: 1.64 mg/dL — ABNORMAL HIGH (ref 0.44–1.00)
GFR calc Af Amer: 36 mL/min — ABNORMAL LOW (ref >60)
GFR calc non Af Amer: 31 mL/min — ABNORMAL LOW (ref >60)
Glucose, Bld: 118 mg/dL — ABNORMAL HIGH (ref 70–99)
Potassium: 3.8 mmol/L (ref 3.5–5.1)
Sodium: 140 mmol/L (ref 135–145)

## 2019-11-25 LAB — PROTIME-INR
INR: 2.6 — ABNORMAL HIGH (ref 0.8–1.2)
Prothrombin Time: 27.2 seconds — ABNORMAL HIGH (ref 11.4–15.2)

## 2019-11-25 MED ORDER — WARFARIN SODIUM 5 MG PO TABS
5.0000 mg | ORAL_TABLET | Freq: Once | ORAL | Status: AC
  Administered 2019-11-25: 5 mg via ORAL
  Filled 2019-11-25: qty 1

## 2019-11-25 MED ORDER — METOLAZONE 5 MG PO TABS
2.5000 mg | ORAL_TABLET | Freq: Once | ORAL | Status: AC
  Administered 2019-11-25: 2.5 mg via ORAL
  Filled 2019-11-25: qty 1

## 2019-11-25 MED ORDER — POTASSIUM CHLORIDE CRYS ER 20 MEQ PO TBCR
40.0000 meq | EXTENDED_RELEASE_TABLET | Freq: Two times a day (BID) | ORAL | Status: DC
  Administered 2019-11-25 – 2019-11-30 (×11): 40 meq via ORAL
  Filled 2019-11-25 (×11): qty 2

## 2019-11-25 MED ORDER — TRAZODONE HCL 100 MG PO TABS
200.0000 mg | ORAL_TABLET | Freq: Every day | ORAL | Status: DC
  Administered 2019-11-25 – 2019-11-30 (×6): 200 mg via ORAL
  Filled 2019-11-25 (×6): qty 2

## 2019-11-25 NOTE — Evaluation (Signed)
Physical Therapy Evaluation Patient Details Name: Hannah [name] MRN: [id number] DOB: 1945/09/03 Today's Date: 11/25/2019   History of Present Illness  Patient is a 74 y/o female who presents s/p TEE/DC-CV 5/13 and admitted for diuresis.PMH includes bil LE edema, A-fib, DM, HTN, obesity.  Clinical Impression  Patient presents with pain, generalized weakness, decreased activity tolerance and impaired mobility s/p above. Pt reports limited household ambulation the last few weeks due to a torn meniscus and ankle sprain. Daughter assists with IADLs. Pt reports she is a furniture walker and uses SPC PRN. 2 falls reported recently. Today, pt requires Min A for bed mobility and Min guard assist for transfers with use of RW. Pt will likely need a RW vs rollator for ambulation but has her heart set up a rollator. Will bring one next session for pt to try. Will follow acutely to maximize independence and mobility prior to return home.    Follow Up Recommendations Home health PT;Supervision - Intermittent    Equipment Recommendations  Other (comment)(4 wheeled walker (per pt request))    Recommendations for Other Services OT consult     Precautions / Restrictions Precautions Precautions: Fall Restrictions Weight Bearing Restrictions: No      Mobility  Bed Mobility Overal bed mobility: Needs Assistance Bed Mobility: Supine to Sit     Supine to sit: Min assist;HOB elevated     General bed mobility comments: Heavy use of rail, assist with trunk initially.  Transfers Overall transfer level: Needs assistance Equipment used: Rolling walker (2 wheeled) Transfers: Sit to/from [facility] Sit to Stand: Min guard Stand pivot transfers: Min guard       General transfer comment: Min guard for safety. Cues for hand placement/technique.  Stood from [employer]. Transferred to chair  Ambulation/Gait             General Gait Details: Deferred as pt lunch just  arrived.  Stairs            Wheelchair Mobility    Modified Rankin (Stroke Patients Only)       Balance Overall balance assessment: Mild deficits observed, not formally tested                                           Pertinent Vitals/Pain Pain Assessment: Faces Faces Pain Scale: Hurts even more Pain Location: generalized LEs Pain Descriptors / Indicators: Sore;Aching Pain Intervention(s): Repositioned;Monitored during session    Home Living Family/patient expects to be discharged to:: Private residence Living Arrangements: Children Available Help at Discharge: Family;Available PRN/intermittently Type of Home: House Home Access: Stairs to enter Entrance Stairs-Rails: None Entrance Stairs-Number of Steps: 2 Home Layout: One level Home Equipment: Cane - single point;Shower seat      Prior Function Level of Independence: Independent with assistive device(s)         Comments: Furniture walker. Uses SPC PRN. Limited walking in home in last 3-4 months. 2 falls in last 6 months. Been sleeping in chair. Daughter assists with IADLs. Pt does not drive.     Hand Dominance        Extremity/Trunk Assessment   Upper Extremity Assessment Upper Extremity Assessment: Defer to OT evaluation    Lower Extremity Assessment Lower Extremity Assessment: Generalized weakness(but functional)       Communication   Communication: No difficulties  Cognition Arousal/Alertness: Awake/alert Behavior During Therapy: Anxious Overall Cognitive Status: Within  Functional Limits for tasks assessed                                 General Comments: for basic mobility tasks. Highly anxious with events happening.      General Comments General comments (skin integrity, edema, etc.): VSS with Sp02 >90% on RA.    Exercises     Assessment/Plan    PT Assessment Patient needs continued PT services  PT Problem List Decreased strength;Decreased  mobility;Decreased balance;Decreased knowledge of use of DME;Pain;Cardiopulmonary status limiting activity;Decreased cognition;Decreased activity tolerance       PT Treatment Interventions Therapeutic activities;Therapeutic exercise;Gait training;Patient/family education;Balance training;Functional mobility training;Stair training;DME instruction    PT Goals (Current goals can be found in the Care Plan section)  Acute Rehab PT Goals Patient Stated Goal: to get better PT Goal Formulation: With patient Time For Goal Achievement: 12/09/19 Potential to Achieve Goals: Fair    Frequency Min 3X/week   Barriers to discharge Inaccessible home environment stairs    Co-evaluation               AM-PAC PT "6 Clicks" Mobility  Outcome Measure Help needed turning from your back to your side while in a flat bed without using bedrails?: A Little Help needed moving from lying on your back to sitting on the side of a flat bed without using bedrails?: A Little Help needed moving to and from a bed to a chair (including a wheelchair)?: A Little Help needed standing up from a chair using your arms (e.g., wheelchair or bedside chair)?: A Little Help needed to walk in hospital room?: A Little Help needed climbing 3-5 steps with a railing? : A Little 6 Click Score: 18    End of Session   Activity Tolerance: Patient tolerated treatment well Patient left: in chair;with call bell/phone within reach;with chair alarm set Nurse Communication: Mobility status PT Visit Diagnosis: Muscle weakness (generalized) (M62.81);Difficulty in walking, not elsewhere classified (R26.2);Unsteadiness on feet (R26.81)    Time: 1129(1210-1230)-1148 PT Time Calculation (min) (ACUTE ONLY): 19 min   Charges:   PT Evaluation $PT Eval Moderate Complexity: 1 Mod PT Treatments $Therapeutic Activity: 8-22 mins        Marisa Severin, PT, DPT Acute Rehabilitation Services Pager [phone] Office  [place] 11/25/2019, 1:38 PM

## 2019-11-25 NOTE — Progress Notes (Signed)
Patient upset about receiving 50 mg trazodone instead of her 200mg  dose. She is yelling at nurse and demanding she gets the proper dose. Tried to explain on why the provider prescribed said amount but patient still wanting home dose. Paged Cardiology, got orders for home dose. Will monitor patient closely.

## 2019-11-25 NOTE — Progress Notes (Signed)
Patient ID: Allene [name], female   DOB: August 29, 1945, 74 y.o.   MRN: [id number]     Advanced Heart Failure Rounding Note  PCP-Cardiologist: Will Meredith Leeds, MD   Subjective:    Admitted after TEE-DCCV for diuresis.   No complaints this morning.  Says she did not urinate much with IV Lasix yesterday pm.  Rhythm regular this morning, hard to tell if definitely NSR.   TEE: Normal left ventricular size and thickness, EF 60-65% with normal wall motion.  The right ventricle was mildly dilated with normal systolic function.  There was a D-shaped interventricular septum consistent with RV pressure/volume overload.  The left atrium was moderately dilated, no LA appendage thrombus. Normal right atrium.  No evidence for PFO/ASD by color doppler.  There was moderate mitral regurgitation, ERO 0.19 cm^2 by PISA.  No systolic flow reversal on the pulmonary vein doppler pattern.  Trileaflet aortic valve with no stenosis or regurgitation.  Moderate TR, peak RV-RA gradient 36 mmHg.    Objective:   Weight Range: (!) 147.8 kg Body mass index is 52.59 kg/m.   Vital Signs:   Temp:  [97.4 F (36.3 C)-98.6 F (37 C)] 98.2 F (36.8 C) (05/14 0818) Pulse Rate:  [70-79] 74 (05/14 0818) Resp:  [15-20] 18 (05/14 0818) BP: (111-139)/(52-68) 126/54 (05/14 0818) SpO2:  [94 %-100 %] 98 % (05/14 0459) Weight:  [147.8 kg-149.7 kg] 147.8 kg (05/14 0513)    Weight change: Filed Weights   11/24/19 1207 11/25/19 0513  Weight: (!) 149.7 kg (!) 147.8 kg    Intake/Output:   Intake/Output Summary (Last 24 hours) at 11/25/2019 0841 Last data filed at 11/25/2019 0534 Gross per 24 hour  Intake 536.67 ml  Output 350 ml  Net 186.67 ml      Physical Exam    General:  Well appearing. No resp difficulty HEENT: Normal Neck: Supple. JVP 12 cm. Carotids 2+ bilat; no bruits. No lymphadenopathy or thyromegaly appreciated. Cor: PMI nondisplaced. Regular rate & rhythm. No rubs, gallops or murmurs. Lungs: Clear  Abdomen: Soft, nontender, nondistended. No hepatosplenomegaly. No bruits or masses. Good bowel sounds. Extremities: No cyanosis, clubbing, rash. 2+ edema to knees.  Neuro: Alert & orientedx3, cranial nerves grossly intact. moves all 4 extremities w/o difficulty. Affect pleasant   Telemetry   Regular rhythm in 70s, ?NSR (personally reviewed)  Labs    CBC Recent Labs    11/24/19 1223  HGB 9.5*  HCT 99.2*   Basic Metabolic Panel Recent Labs    11/24/19 1455 11/25/19 0545  NA 140 140  K 3.7 3.8  CL 98 99  CO2 29 29  GLUCOSE 133* 118*  BUN 29* 29*  CREATININE 1.64* 1.64*  CALCIUM 8.8* 8.6*   Liver Function Tests No results for input(s): AST, ALT, ALKPHOS, BILITOT, PROT, ALBUMIN in the last 72 hours. No results for input(s): LIPASE, AMYLASE in the last 72 hours. Cardiac Enzymes No results for input(s): CKTOTAL, CKMB, CKMBINDEX, TROPONINI in the last 72 hours.  BNP: BNP (last 3 results) No results for input(s): BNP in the last 8760 hours.  ProBNP (last 3 results) No results for input(s): PROBNP in the last 8760 hours.   D-Dimer No results for input(s): DDIMER in the last 72 hours. Hemoglobin A1C No results for input(s): HGBA1C in the last 72 hours. Fasting Lipid Panel No results for input(s): CHOL, HDL, LDLCALC, TRIG, CHOLHDL, LDLDIRECT in the last 72 hours. Thyroid Function Tests No results for input(s): TSH, T4TOTAL, T3FREE, THYROIDAB in the last 72  hours.  Invalid input(s): FREET3  Other results:   Imaging     No results found.   Medications:     Scheduled Medications: . amiodarone  200 mg Oral Daily  . busPIRone  15 mg Oral TID  . DULoxetine  90 mg Oral Daily  . furosemide  80 mg Intravenous BID  . levothyroxine  75 mcg Oral Q0600  . loratadine  10 mg Oral QPM  . metFORMIN  500 mg Oral Q breakfast  . metolazone  2.5 mg Oral Once  . montelukast  10 mg Oral QPM  . potassium chloride  40 mEq Oral BID  . rosuvastatin  5 mg Oral Daily  .  sodium chloride flush  3 mL Intravenous Q12H  . traZODone  200 mg Oral QHS  . traZODone  50 mg Oral Once  . warfarin  5 mg Oral ONCE-1600  . Warfarin - Pharmacist Dosing Inpatient   Does not apply q1600     Infusions: . sodium chloride       PRN Medications:  sodium chloride, acetaminophen, ondansetron (ZOFRAN) IV, sodium chloride flush   Assessment/Plan   1. Acute on chronic diastolic CHF: TEE with LV EF 60-65%, normal RV function but D-shaped septum, moderate MR.  She is significantly volume overloaded.  - Lasix 80 mg IV bid today along with a dose of metolazone 2.5 x 1.  - Replace K.  2. CKD: Stage 3.  Creatinine stable at 1.6.  Follow carefully.  Hopefully will see some improvement as we lower renal venous pressure.  3. Atrial fibrillation: Paroxysmal.  TEE-guided DCCV on 5/13.  Rhythm regular today but hard to tell if NSR.   - ECG today.  - Continue warfarin.  - Continue amiodarone.   Length of Stay: 1  Loralie Champagne, MD  11/25/2019, 8:41 AM  Advanced Heart Failure Team Pager [phone] (M-F; 7a - 4p)  Please contact [facility] for night-coverage after hours (4p -7a ) and weekends on [email or web address]

## 2019-11-25 NOTE — Progress Notes (Addendum)
Bladder scanned patient due to less than 400 ml output after receiving 80mg  iv lasix 5 hrs prior. Bladder scanner showed 13ml.. Patient is able to tell me when she gets the urge, will try [facility] next time. Will continue to monitor patient.   Update  am round: patient's brief saturated and about 50 ml on suction canister. Bladder scanned showed 148 ml

## 2019-11-25 NOTE — Progress Notes (Signed)
Orthopedic Tech Progress Note Patient Details:  Hannah [name] August 27, 1945 [id number] Spoke with RN this morning, letting him know that MD had reordered about patient needing UNNA BOOTS but no one called yesterday with products so I could apply UNNA BOOTS so I asked him to order 2 4" COBANS and I could come and apply boots. Patient ID: Hannah [name], female   DOB: 1946/07/08, 74 y.o.   MRN: [id number]   Hannah [name] 11/25/2019, 8:56 AM

## 2019-11-25 NOTE — Progress Notes (Signed)
Orthopedic Tech Progress Note Patient Details:  Hannah [name] [birth year] [id number]  Ortho Devices Type of Ortho Device: Haematologist Ortho Device/Splint Location: BLE Ortho Device/Splint Interventions: Ordered, Application   Post Interventions Patient Tolerated: Well Instructions Provided: Care of device   Janit Pagan 11/25/2019, 11:23 AM

## 2019-11-25 NOTE — Progress Notes (Signed)
ANTICOAGULATION CONSULT NOTE - Follow Up Consult  Pharmacy Consult for Warfarin Indication: atrial fibrillation  Allergies  Allergen Reactions  . Penicillins     Possibility; father and brother allergic     Patient Measurements: Height: 5\' 6"  (167.6 cm) Weight: (!) 147.8 kg (325 lb 13.4 oz) IBW/kg (Calculated) : 59.3  Vital Signs: Temp: 98.1 F (36.7 C) (05/14 0459) Temp Source: Oral (05/14 0459) BP: 139/66 (05/14 0459) Pulse Rate: 72 (05/14 0459)  Labs: Recent Labs    11/24/19 1223 11/24/19 1455 11/25/19 0545  HGB 9.5*  --   --   HCT 28.0*  --   --   LABPROT  --  24.8* 27.2*  INR  --  2.3* 2.6*  CREATININE 1.60* 1.64* 1.64*    Estimated Creatinine Clearance: 46.4 mL/min (A) (by C-G formula based on SCr of 1.64 mg/dL (H)).  Assessment: 74 year old female on warfarin prior to admission for Afib admitted for DCCV and volume overload. INR therapeutic this morning at 2.6.  Warfarin dose prior to admission 5 mg daily except 2.5 mg on Sunday.  Goal of Therapy:  INR 2-3 Monitor platelets by anticoagulation protocol: Yes   Plan:  Warfarin 5 mg po x 1 dose today Daily INR  Arrie Senate, PharmD, BCPS Clinical Pharmacist [phone] Please check AMION for all [facility] numbers 11/25/2019

## 2019-11-25 NOTE — Anesthesia Postprocedure Evaluation (Signed)
Anesthesia Post Note  Patient: Hannah [name]  Procedure(s) Performed: TRANSESOPHAGEAL ECHOCARDIOGRAM (TEE) (N/A ) CARDIOVERSION (N/A )     Patient location during evaluation: Endoscopy Anesthesia Type: General Level of consciousness: patient cooperative and awake Pain management: pain level controlled Vital Signs Assessment: post-procedure vital signs reviewed and stable Respiratory status: spontaneous breathing, nonlabored ventilation, respiratory function stable and patient connected to nasal cannula oxygen Cardiovascular status: blood pressure returned to baseline and stable Postop Assessment: no apparent nausea or vomiting Anesthetic complications: no    Last Vitals:  Vitals:   11/25/19 0459 11/25/19 0818  BP: 139/66 (!) 126/54  Pulse: 72 74  Resp: 20 18  Temp: 36.7 C 36.8 C  SpO2: 98%     Last Pain:  Vitals:   11/25/19 0818  TempSrc: Oral  PainSc:                  Aslan Himes

## 2019-11-26 ENCOUNTER — Inpatient Hospital Stay: Payer: Self-pay

## 2019-11-26 ENCOUNTER — Inpatient Hospital Stay (HOSPITAL_COMMUNITY): Payer: PPO

## 2019-11-26 LAB — GLUCOSE, CAPILLARY
Glucose-Capillary: 107 mg/dL — ABNORMAL HIGH (ref 70–99)
Glucose-Capillary: 83 mg/dL (ref 70–99)
Glucose-Capillary: 108 mg/dL — ABNORMAL HIGH (ref 70–99)

## 2019-11-26 LAB — BASIC METABOLIC PANEL
Anion gap: 9 (ref 5–15)
BUN: 31 mg/dL — ABNORMAL HIGH (ref 8–23)
CO2: 32 mmol/L (ref 22–32)
Calcium: 8.6 mg/dL — ABNORMAL LOW (ref 8.9–10.3)
Chloride: 99 mmol/L (ref 98–111)
Creatinine, Ser: 1.48 mg/dL — ABNORMAL HIGH (ref 0.44–1.00)
GFR calc Af Amer: 41 mL/min — ABNORMAL LOW (ref >60)
GFR calc non Af Amer: 35 mL/min — ABNORMAL LOW (ref >60)
Glucose, Bld: 101 mg/dL — ABNORMAL HIGH (ref 70–99)
Potassium: 3.6 mmol/L (ref 3.5–5.1)
Sodium: 140 mmol/L (ref 135–145)

## 2019-11-26 LAB — PROTIME-INR
INR: 2.7 — ABNORMAL HIGH (ref 0.8–1.2)
Prothrombin Time: 27.4 s — ABNORMAL HIGH (ref 11.4–15.2)

## 2019-11-26 LAB — MAGNESIUM: Magnesium: 2.2 mg/dL (ref 1.7–2.4)

## 2019-11-26 MED ORDER — METOLAZONE 5 MG PO TABS
2.5000 mg | ORAL_TABLET | Freq: Once | ORAL | Status: AC
  Administered 2019-11-26: 2.5 mg via ORAL
  Filled 2019-11-26: qty 1

## 2019-11-26 MED ORDER — SODIUM CHLORIDE 0.9% FLUSH
10.0000 mL | Freq: Two times a day (BID) | INTRAVENOUS | Status: DC
  Administered 2019-11-26 – 2019-11-30 (×5): 10 mL

## 2019-11-26 MED ORDER — CHLORHEXIDINE GLUCONATE CLOTH 2 % EX PADS
6.0000 | MEDICATED_PAD | Freq: Every day | CUTANEOUS | Status: DC
  Administered 2019-11-26 – 2019-11-29 (×3): 6 via TOPICAL

## 2019-11-26 MED ORDER — SODIUM CHLORIDE 0.9% FLUSH: 10.0000 mL | INTRAVENOUS | Status: DC | PRN

## 2019-11-26 MED ORDER — FUROSEMIDE 10 MG/ML IJ SOLN
10.0000 mg/h | INTRAVENOUS | Status: DC
  Administered 2019-11-26 – 2019-11-28 (×3): 10 mg/h via INTRAVENOUS
  Filled 2019-11-26 (×3): qty 25

## 2019-11-26 MED ORDER — WARFARIN SODIUM 5 MG PO TABS
5.0000 mg | ORAL_TABLET | Freq: Once | ORAL | Status: AC
  Administered 2019-11-26: 5 mg via ORAL
  Filled 2019-11-26: qty 1

## 2019-11-26 MED ORDER — AMIODARONE HCL IN DEXTROSE 360-4.14 MG/200ML-% IV SOLN
30.0000 mg/h | INTRAVENOUS | Status: DC
  Administered 2019-11-26 – 2019-11-30 (×8): 30 mg/h via INTRAVENOUS
  Filled 2019-11-26 (×9): qty 200

## 2019-11-26 MED ORDER — POTASSIUM CHLORIDE CRYS ER 20 MEQ PO TBCR
40.0000 meq | EXTENDED_RELEASE_TABLET | Freq: Once | ORAL | Status: AC
  Administered 2019-11-26: 40 meq via ORAL
  Filled 2019-11-26: qty 2

## 2019-11-26 NOTE — Progress Notes (Signed)
Peripherally Inserted Central Catheter Placement  The IV Nurse has discussed with the patient and/or persons authorized to consent for the patient, the purpose of this procedure and the potential benefits and risks involved with this procedure.  The benefits include less needle sticks, lab draws from the catheter, and the patient may be discharged home with the catheter. Risks include, but not limited to, infection, bleeding, blood clot (thrombus formation), and puncture of an artery; nerve damage and irregular heartbeat and possibility to perform a PICC exchange if needed/ordered by physician.  Alternatives to this procedure were also discussed.  Bard Power PICC patient education guide, fact sheet on infection prevention and patient information card has been provided to patient /or left at bedside.    PICC Placement Documentation  PICC Double Lumen 11/26/19 PICC Right Cephalic 42 cm 0 cm (Active)  Indication for Insertion or Continuance of Line Chronic illness with exacerbations (CF, Sickle Cell, etc.) 11/26/19 1200  Exposed Catheter (cm) 0 cm 11/26/19 1200  Site Assessment Clean;Dry;Intact 11/26/19 1200  Lumen #1 Status Flushed;Blood return noted;Saline locked 11/26/19 1200  Lumen #2 Status Flushed;Blood return noted;Saline locked 11/26/19 1200  Dressing Type Transparent 11/26/19 1200  Dressing Status Clean;Dry;Intact;Antimicrobial disc in place 11/26/19 1200  Safety Lock Not Applicable 37/10/62 6948  Line Care Connections checked and tightened 11/26/19 1200  Line Adjustment (NICU/IV Team Only) No 11/26/19 1200  Dressing Intervention New dressing 11/26/19 1200  Dressing Change Due 12/03/19 11/26/19 1200       Lorenza Cambridge 11/26/2019, 2:15 PM

## 2019-11-26 NOTE — Progress Notes (Signed)
Pt is back in A-fib, rate controlled. Rhythm confirmed by EKG. EKG available in chart for review.

## 2019-11-26 NOTE — Progress Notes (Signed)
Amio rate ordered at 30 mg/hr verified with cards fellow [place]. Started infusion via picc line.

## 2019-11-26 NOTE — Progress Notes (Signed)
Orthopedic Tech Progress Note Patient Details:  [name] [birth year] [id number] RN stated that unna boots were falling off, new onset of unna boots were applied to pt  Patient ID: Hannah [name], female   DOB: 19-Oct-1945, 74 y.o.   MRN: [id number]   Majel Homer 11/26/2019, 10:32 AM

## 2019-11-26 NOTE — Progress Notes (Signed)
ANTICOAGULATION CONSULT NOTE - Follow Up Consult  Pharmacy Consult for Warfarin Indication: atrial fibrillation  Allergies  Allergen Reactions  . Penicillins Other (See Comments)    Possibility; father and brother allergic     Patient Measurements: Height: 5\' 6"  (167.6 cm) Weight: (!) 146.1 kg (322 lb) IBW/kg (Calculated) : 59.3  Vital Signs: Temp: 98.5 F (36.9 C) (05/15 0536) Temp Source: Oral (05/15 0536) BP: 154/73 (05/15 0536) Pulse Rate: 86 (05/15 0536)  Labs: Recent Labs    11/24/19 1223 11/24/19 1223 11/24/19 1455 11/25/19 0545 11/26/19 0715  HGB 9.5*  --   --   --   --   HCT 28.0*  --   --   --   --   LABPROT  --   --  24.8* 27.2* 27.4*  INR  --   --  2.3* 2.6* 2.7*  CREATININE 1.60*   < > 1.64* 1.64* 1.48*   < > = values in this interval not displayed.    Estimated Creatinine Clearance: 51 mL/min (A) (by C-G formula based on SCr of 1.48 mg/dL (H)).  Assessment: 74 year old female on warfarin prior to admission for Afib admitted for DCCV and volume overload. INR therapeutic this morning at 2.7.  Warfarin dose prior to admission 5 mg daily except 2.5 mg on Sunday.  Goal of Therapy:  INR 2-3 Monitor platelets by anticoagulation protocol: Yes   Plan:  Warfarin 5 mg po x 1 dose today Daily INR Monitor for s/sx of bleeding  Lorel Monaco, PharmD PGY1 Ambulatory Care Resident [facility] # [phone]  Please check AMION for all [facility] numbers 11/26/2019

## 2019-11-26 NOTE — Progress Notes (Signed)
Patient ID: Hannah [name], female   DOB: 1945/11/02, 74 y.o.        Advanced Heart Failure Rounding Note  PCP-Cardiologist: Will Meredith Leeds, MD   Subjective:    Admitted after TEE-DCCV for diuresis.   She went back into atrial fibrillation overnight.  She appears to have diuresed some and weight is down, think I/Os not accurately recorded.  Creatinine lower at 1.48.   TEE: Normal left ventricular size and thickness, EF 60-65% with normal wall motion.  The right ventricle was mildly dilated with normal systolic function.  There was a D-shaped interventricular septum consistent with RV pressure/volume overload.  The left atrium was moderately dilated, no LA appendage thrombus. Normal right atrium.  No evidence for PFO/ASD by color doppler.  There was moderate mitral regurgitation, ERO 0.19 cm^2 by PISA.  No systolic flow reversal on the pulmonary vein doppler pattern.  Trileaflet aortic valve with no stenosis or regurgitation.  Moderate TR, peak RV-RA gradient 36 mmHg.    Objective:   Weight Range: (!) 146.1 kg Body mass index is 51.97 kg/m.   Vital Signs:   Temp:  [98.5 F (36.9 C)-99.4 F (37.4 C)] 98.5 F (36.9 C) (05/15 0536) Pulse Rate:  [71-86] 86 (05/15 0536) Resp:  [18-19] 19 (05/15 0536) BP: (123-154)/(63-73) 154/73 (05/15 0536) SpO2:  [95 %-100 %] 97 % (05/15 0536) Weight:  [146.1 kg] 146.1 kg (05/15 0558) Last BM Date: 11/23/19  Weight change: Filed Weights   11/24/19 1207 11/25/19 0513 11/26/19 0558  Weight: (!) 149.7 kg (!) 147.8 kg (!) 146.1 kg    Intake/Output:   Intake/Output Summary (Last 24 hours) at 11/26/2019 0929 Last data filed at 11/26/2019 0530 Gross per 24 hour  Intake --  Output 1050 ml  Net -1050 ml      Physical Exam    General: NAD Neck: JVP 14-16 cm  Lungs: Clear to auscultation bilaterally with normal respiratory effort. CV: Nondisplaced PMI.  Heart irregular S1/S2, no S3/S4, no murmur.  2+ edema to knees. Abdomen: Soft,  nontender, no hepatosplenomegaly, no distention.  Skin: Intact without lesions or rashes.  Neurologic: Alert and oriented x 3.  Psych: Normal affect. Extremities: No clubbing or cyanosis.  HEENT: Normal.    Telemetry   Atrial fibrillation rate 80s (personally reviewed)  Labs    CBC Recent Labs    11/24/19 1223  HGB 9.5*  HCT 67.1*   Basic Metabolic Panel Recent Labs    11/25/19 0545 11/26/19 0715  NA 140 140  K 3.8 3.6  CL 99 99  CO2 29 32  GLUCOSE 118* 101*  BUN 29* 31*  CREATININE 1.64* 1.48*  CALCIUM 8.6* 8.6*  MG  --  2.2   Liver Function Tests No results for input(s): AST, ALT, ALKPHOS, BILITOT, PROT, ALBUMIN in the last 72 hours. No results for input(s): LIPASE, AMYLASE in the last 72 hours. Cardiac Enzymes No results for input(s): CKTOTAL, CKMB, CKMBINDEX, TROPONINI in the last 72 hours.  BNP: BNP (last 3 results) No results for input(s): BNP in the last 8760 hours.  ProBNP (last 3 results) No results for input(s): PROBNP in the last 8760 hours.   D-Dimer No results for input(s): DDIMER in the last 72 hours. Hemoglobin A1C No results for input(s): HGBA1C in the last 72 hours. Fasting Lipid Panel No results for input(s): CHOL, HDL, LDLCALC, TRIG, CHOLHDL, LDLDIRECT in the last 72 hours. Thyroid Function Tests No results for input(s): TSH, T4TOTAL, T3FREE, THYROIDAB in the last 72 hours.  Invalid input(s): FREET3  Other results:   Imaging    No results found.   Medications:     Scheduled Medications: . busPIRone  15 mg Oral TID  . DULoxetine  90 mg Oral Daily  . levothyroxine  75 mcg Oral Q0600  . loratadine  10 mg Oral QPM  . metFORMIN  500 mg Oral Q breakfast  . metolazone  2.5 mg Oral Once  . montelukast  10 mg Oral QPM  . potassium chloride  40 mEq Oral BID  . potassium chloride  40 mEq Oral Once  . rosuvastatin  5 mg Oral Daily  . sodium chloride flush  3 mL Intravenous Q12H  . traZODone  200 mg Oral QHS  . traZODone  50  mg Oral Once  . Warfarin - Pharmacist Dosing Inpatient   Does not apply q1600    Infusions: . sodium chloride    . amiodarone    . furosemide (LASIX) infusion      PRN Medications: sodium chloride, acetaminophen, ondansetron (ZOFRAN) IV, sodium chloride flush   Assessment/Plan   1. Acute on chronic diastolic CHF: TEE with LV EF 60-65%, normal RV function but D-shaped septum, moderate MR.  She is significantly volume overloaded on exam still.  - Will place PICC line to monitor CVP as we diurese (body habitus makes volume assessment difficult).  - Replace K.  - Lasix 80 mg IV x 1 this morning then start Lasix gtt at 10 mg/hr.  Will give another dose of metolazone 2.5 x 1.  - Unna boots. 2. CKD: Stage 3.  Creatinine lower today at 1.48. 3. Atrial fibrillation: Paroxysmal.  TEE-guided DCCV on 5/13.  She went back into atrial fibrillation overnight. - Continue warfarin.  - Will transition to IV amiodarone for reloading, then DCCV again once volume status is better controlled.   Length of Stay: 2  Loralie Champagne, MD  11/26/2019, 9:29 AM  Advanced Heart Failure Team Pager [phone] (M-F; 7a - 4p)  Please contact [facility] for night-coverage after hours (4p -7a ) and weekends on [email or web address]

## 2019-11-26 NOTE — Progress Notes (Signed)
OT Cancellation Note  Patient Details Name: Hannah [name] MRN: [id number] DOB: 30-Jul-1945   Cancelled Treatment:    Reason Eval/Treat Not Completed: Patient at procedure or test/ unavailable Pt having sterile procedure on first attempt to see her, returned lateer and pt going for X-rays. Will follow up next available time  Britt Bottom 11/26/2019, 12:54 PM

## 2019-11-27 LAB — BASIC METABOLIC PANEL
Anion gap: 10 (ref 5–15)
BUN: 30 mg/dL — ABNORMAL HIGH (ref 8–23)
CO2: 34 mmol/L — ABNORMAL HIGH (ref 22–32)
Calcium: 8.4 mg/dL — ABNORMAL LOW (ref 8.9–10.3)
Chloride: 96 mmol/L — ABNORMAL LOW (ref 98–111)
Creatinine, Ser: 1.62 mg/dL — ABNORMAL HIGH (ref 0.44–1.00)
GFR calc Af Amer: 36 mL/min — ABNORMAL LOW (ref >60)
GFR calc non Af Amer: 31 mL/min — ABNORMAL LOW (ref >60)
Glucose, Bld: 158 mg/dL — ABNORMAL HIGH (ref 70–99)
Potassium: 3.7 mmol/L (ref 3.5–5.1)
Sodium: 140 mmol/L (ref 135–145)

## 2019-11-27 LAB — PROTIME-INR
INR: 2.7 — ABNORMAL HIGH (ref 0.8–1.2)
Prothrombin Time: 27.4 seconds — ABNORMAL HIGH (ref 11.4–15.2)

## 2019-11-27 MED ORDER — SPIRONOLACTONE 12.5 MG HALF TABLET
12.5000 mg | ORAL_TABLET | Freq: Every day | ORAL | Status: DC
  Administered 2019-11-27 – 2019-11-30 (×4): 12.5 mg via ORAL
  Filled 2019-11-27 (×5): qty 1

## 2019-11-27 MED ORDER — METOLAZONE 5 MG PO TABS
2.5000 mg | ORAL_TABLET | Freq: Once | ORAL | Status: AC
  Administered 2019-11-27: 2.5 mg via ORAL
  Filled 2019-11-27: qty 1

## 2019-11-27 MED ORDER — POLYVINYL ALCOHOL 1.4 % OP SOLN
1.0000 [drp] | OPHTHALMIC | Status: DC | PRN
  Filled 2019-11-27: qty 15

## 2019-11-27 MED ORDER — POTASSIUM CHLORIDE CRYS ER 20 MEQ PO TBCR
20.0000 meq | EXTENDED_RELEASE_TABLET | Freq: Once | ORAL | Status: AC
  Administered 2019-11-27: 20 meq via ORAL
  Filled 2019-11-27: qty 1

## 2019-11-27 MED ORDER — WARFARIN SODIUM 5 MG PO TABS
5.0000 mg | ORAL_TABLET | Freq: Once | ORAL | Status: AC
  Administered 2019-11-27: 5 mg via ORAL
  Filled 2019-11-27: qty 1

## 2019-11-27 NOTE — Progress Notes (Addendum)
ANTICOAGULATION CONSULT NOTE - Follow Up Consult  Pharmacy Consult for Warfarin Indication: atrial fibrillation  Allergies  Allergen Reactions  . Penicillins Other (See Comments)    Possibility; father and brother allergic     Patient Measurements: Height: 5\' 6"  (167.6 cm) Weight: (!) 142.5 kg (314 lb 1.6 oz) IBW/kg (Calculated) : 59.3  Vital Signs: Temp: 97.9 F (36.6 C) (05/16 0841) Temp Source: Oral (05/16 0841) BP: 124/62 (05/16 0841) Pulse Rate: 69 (05/16 0604)  Labs: Recent Labs    11/24/19 1223 11/24/19 1455 11/25/19 0545 11/26/19 0715 11/27/19 0436  HGB 9.5*  --   --   --   --   HCT 28.0*  --   --   --   --   LABPROT  --    < > 27.2* 27.4* 27.4*  INR  --    < > 2.6* 2.7* 2.7*  CREATININE 1.60*   < > 1.64* 1.48* 1.62*   < > = values in this interval not displayed.    Estimated Creatinine Clearance: 45.9 mL/min (A) (by C-G formula based on SCr of 1.62 mg/dL (H)).  Assessment: 74 year old female on warfarin prior to admission for Afib admitted for DCCV and volume overload.  Warfarin dose prior to admission 5 mg daily except 2.5 mg on Sunday.  INR therapeutic this morning at 2.7 again. CBC stable (5/13)  Goal of Therapy:  INR 2-3 Monitor platelets by anticoagulation protocol: Yes   Plan:  Warfarin 5 mg po x 1 dose today Daily INR Monitor for s/sx of bleeding  Lorel Monaco, PharmD PGY1 Ambulatory Care Resident [facility] # [phone]  Please check AMION for all [place] numbers 11/27/2019

## 2019-11-27 NOTE — Progress Notes (Signed)
Patient ID: Marializ [name], female   DOB: 24-Nov-1945, 74 y.o.   MRN: [id number]     Advanced Heart Failure Rounding Note  PCP-Cardiologist: Will Meredith Leeds, MD   Subjective:    Admitted after TEE-DCCV for diuresis.  Went back into atrial fibrillation, remains in rate-controlled fibrillation today it appears.   She is now on a Lasix gtt 10 mg/hr, diuresed well yesterday with dose of metolazone and weight down.  Creatinine 1.6.  CVP 15-16 this morning.   TEE: Normal left ventricular size and thickness, EF 60-65% with normal wall motion.  The right ventricle was mildly dilated with normal systolic function.  There was a D-shaped interventricular septum consistent with RV pressure/volume overload.  The left atrium was moderately dilated, no LA appendage thrombus. Normal right atrium.  No evidence for PFO/ASD by color doppler.  There was moderate mitral regurgitation, ERO 0.19 cm^2 by PISA.  No systolic flow reversal on the pulmonary vein doppler pattern.  Trileaflet aortic valve with no stenosis or regurgitation.  Moderate TR, peak RV-RA gradient 36 mmHg.    Objective:   Weight Range: (!) 142.5 kg Body mass index is 50.7 kg/m.   Vital Signs:   Temp:  [97.2 F (36.2 C)-99.1 F (37.3 C)] 97.9 F (36.6 C) (05/16 0841) Pulse Rate:  [69-78] 69 (05/16 0604) Resp:  [18-20] 18 (05/16 0604) BP: (124-135)/(62-79) 124/62 (05/16 0841) SpO2:  [91 %-100 %] 98 % (05/16 0604) Weight:  [142.5 kg-142.7 kg] 142.5 kg (05/16 0604) Last BM Date: 11/23/19  Weight change: Filed Weights   11/26/19 0558 11/27/19 0500 11/27/19 0604  Weight: (!) 146.1 kg (!) 142.7 kg (!) 142.5 kg    Intake/Output:   Intake/Output Summary (Last 24 hours) at 11/27/2019 0923 Last data filed at 11/27/2019 0700 Gross per 24 hour  Intake 386.26 ml  Output 3100 ml  Net -2713.74 ml      Physical Exam    General: NAD, obese Neck: JVP 14+, no thyromegaly or thyroid nodule.  Lungs: Clear to auscultation bilaterally  with normal respiratory effort. CV: Nondisplaced PMI.  Heart irregular S1/S2, no S3/S4, no murmur.  Lower legs wrapped. Abdomen: Soft, nontender, no hepatosplenomegaly, no distention.  Skin: Intact without lesions or rashes.  Neurologic: Alert and oriented x 3.  Psych: Normal affect. Extremities: No clubbing or cyanosis.  HEENT: Normal.    Telemetry   Look like atrial fibrillation rate 70s (personally reviewed)  Labs    CBC Recent Labs    11/24/19 1223  HGB 9.5*  HCT 01.7*   Basic Metabolic Panel Recent Labs    11/26/19 0715 11/27/19 0436  NA 140 140  K 3.6 3.7  CL 99 96*  CO2 32 34*  GLUCOSE 101* 158*  BUN 31* 30*  CREATININE 1.48* 1.62*  CALCIUM 8.6* 8.4*  MG 2.2  --    Liver Function Tests No results for input(s): AST, ALT, ALKPHOS, BILITOT, PROT, ALBUMIN in the last 72 hours. No results for input(s): LIPASE, AMYLASE in the last 72 hours. Cardiac Enzymes No results for input(s): CKTOTAL, CKMB, CKMBINDEX, TROPONINI in the last 72 hours.  BNP: BNP (last 3 results) No results for input(s): BNP in the last 8760 hours.  ProBNP (last 3 results) No results for input(s): PROBNP in the last 8760 hours.   D-Dimer No results for input(s): DDIMER in the last 72 hours. Hemoglobin A1C No results for input(s): HGBA1C in the last 72 hours. Fasting Lipid Panel No results for input(s): CHOL, HDL, LDLCALC, TRIG, CHOLHDL, LDLDIRECT  in the last 72 hours. Thyroid Function Tests No results for input(s): TSH, T4TOTAL, T3FREE, THYROIDAB in the last 72 hours.  Invalid input(s): FREET3  Other results:   Imaging    DG CHEST PORT 1 VIEW  Result Date: 11/26/2019 CLINICAL DATA:  Check PICC line placement EXAM: PORTABLE CHEST 1 VIEW COMPARISON:  09/14/2016 FINDINGS: Cardiac shadow is enlarged but stable. Loop recorder is again noted and stable. New right-sided PICC line is noted at the cavoatrial junction in satisfactory position. Aortic calcifications are seen. The lungs  are well aerated bilaterally with mild vascular congestion. No focal infiltrate is seen. IMPRESSION: PICC line in satisfactory position. Mild vascular congestion. Electronically Signed   By: Inez Catalina M.D.   On: 11/26/2019 14:02   [place] EKG SITE RITE  Result Date: 11/26/2019 If Site Rite image not attached, placement could not be confirmed due to current cardiac rhythm.    Medications:     Scheduled Medications: . busPIRone  15 mg Oral TID  . Chlorhexidine Gluconate Cloth  6 each Topical Daily  . DULoxetine  90 mg Oral Daily  . levothyroxine  75 mcg Oral Q0600  . loratadine  10 mg Oral QPM  . metFORMIN  500 mg Oral Q breakfast  . metolazone  2.5 mg Oral Once  . montelukast  10 mg Oral QPM  . potassium chloride  20 mEq Oral Once  . potassium chloride  40 mEq Oral BID  . rosuvastatin  5 mg Oral Daily  . sodium chloride flush  10-40 mL Intracatheter Q12H  . sodium chloride flush  3 mL Intravenous Q12H  . spironolactone  12.5 mg Oral Daily  . traZODone  200 mg Oral QHS  . traZODone  50 mg Oral Once  . Warfarin - Pharmacist Dosing Inpatient   Does not apply q1600    Infusions: . sodium chloride    . amiodarone 30 mg/hr (11/27/19 0700)  . furosemide (LASIX) infusion 10 mg/hr (11/27/19 0700)    PRN Medications: sodium chloride, acetaminophen, ondansetron (ZOFRAN) IV, sodium chloride flush, sodium chloride flush   Assessment/Plan   1. Acute on chronic diastolic CHF: TEE with LV EF 60-65%, normal RV function but D-shaped septum, moderate MR.  She is significantly volume overloaded on exam still. CVP 15-16.  - Continue Lasix gtt 10 mg/hr.  Will give another dose of metolazone 2.5 x 1.  - Start spironolactone 12.5 daily.  - Unna boots. - Fluid restrict. 2. CKD: Stage 3.  Creatinine fairly stable today at 1.6.  3. Atrial fibrillation: Paroxysmal.  TEE-guided DCCV on 5/13.  She went back into atrial fibrillation after admission.  - Continue warfarin.  - Continue IV amidarone,  then DCCV again once volume status is better controlled.   Length of Stay: 3  Loralie Champagne, MD  11/27/2019, 9:23 AM  Advanced Heart Failure Team Pager [phone] (M-F; 7a - 4p)  Please contact [facility] for night-coverage after hours (4p -7a ) and weekends on [email or web address]

## 2019-11-27 NOTE — Progress Notes (Addendum)
OT Cancellation Note  Patient Details Name: Hannah [name] MRN: [id number] DOB: [birth year]   Cancelled Treatment:    Reason Eval/Treat Not Completed: Patient declined. Patient reports being wet from purewick, OT offer solutions such as transferring to bedside commode or to bathroom to clean up and change bed sheets. Patient reports she feels uncomfortable as every time she gets up she urinates. Ultimately patient asking OT to come back tomorrow as she wants to get cleaned up in bed. Will re-attempt 5/17 as schedule permits.  Delbert Phenix OT OT office: [place] 11/27/2019, 2:35 PM

## 2019-11-28 ENCOUNTER — Inpatient Hospital Stay (HOSPITAL_COMMUNITY): Payer: PPO | Admitting: Anesthesiology

## 2019-11-28 ENCOUNTER — Inpatient Hospital Stay (HOSPITAL_COMMUNITY): Payer: PPO

## 2019-11-28 LAB — BASIC METABOLIC PANEL
Anion gap: 9 (ref 5–15)
BUN: 37 mg/dL — ABNORMAL HIGH (ref 8–23)
CO2: 37 mmol/L — ABNORMAL HIGH (ref 22–32)
Calcium: 8.8 mg/dL — ABNORMAL LOW (ref 8.9–10.3)
Chloride: 95 mmol/L — ABNORMAL LOW (ref 98–111)
Creatinine, Ser: 1.61 mg/dL — ABNORMAL HIGH (ref 0.44–1.00)
GFR calc Af Amer: 37 mL/min — ABNORMAL LOW (ref >60)
GFR calc non Af Amer: 32 mL/min — ABNORMAL LOW (ref >60)
Glucose, Bld: 121 mg/dL — ABNORMAL HIGH (ref 70–99)
Potassium: 4 mmol/L (ref 3.5–5.1)
Sodium: 141 mmol/L (ref 135–145)

## 2019-11-28 LAB — CBC WITH DIFFERENTIAL/PLATELET
Abs Immature Granulocytes: 0.01 10*3/uL (ref 0.00–0.07)
Basophils Absolute: 0.1 10*3/uL (ref 0.0–0.1)
Basophils Relative: 1 %
Eosinophils Absolute: 0.2 10*3/uL (ref 0.0–0.5)
Eosinophils Relative: 3 %
HCT: 31.5 % — ABNORMAL LOW (ref 36.0–46.0)
Hemoglobin: 9.1 g/dL — ABNORMAL LOW (ref 12.0–15.0)
Immature Granulocytes: 0 %
Lymphocytes Relative: 17 %
Lymphs Abs: 0.9 10*3/uL (ref 0.7–4.0)
MCH: 24.4 pg — ABNORMAL LOW (ref 26.0–34.0)
MCHC: 28.9 g/dL — ABNORMAL LOW (ref 30.0–36.0)
MCV: 84.5 fL (ref 80.0–100.0)
Monocytes Absolute: 0.4 10*3/uL (ref 0.1–1.0)
Monocytes Relative: 8 %
Neutro Abs: 3.8 10*3/uL (ref 1.7–7.7)
Neutrophils Relative %: 71 %
Platelets: 263 10*3/uL (ref 150–400)
RBC: 3.73 MIL/uL — ABNORMAL LOW (ref 3.87–5.11)
RDW: 17 % — ABNORMAL HIGH (ref 11.5–15.5)
WBC: 5.3 10*3/uL (ref 4.0–10.5)
nRBC: 0 % (ref 0.0–0.2)

## 2019-11-28 LAB — PROTIME-INR
INR: 2.5 — ABNORMAL HIGH (ref 0.8–1.2)
Prothrombin Time: 26.5 seconds — ABNORMAL HIGH (ref 11.4–15.2)

## 2019-11-28 MED ORDER — WARFARIN SODIUM 5 MG PO TABS
5.0000 mg | ORAL_TABLET | Freq: Every day | ORAL | Status: AC
  Administered 2019-11-28: 5 mg via ORAL
  Filled 2019-11-28: qty 1

## 2019-11-28 MED ORDER — METOLAZONE 5 MG PO TABS
2.5000 mg | ORAL_TABLET | Freq: Once | ORAL | Status: AC
  Administered 2019-11-28: 2.5 mg via ORAL
  Filled 2019-11-28: qty 1

## 2019-11-28 NOTE — Progress Notes (Signed)
ANTICOAGULATION CONSULT NOTE - Follow Up Consult  Pharmacy Consult for Warfarin Indication: atrial fibrillation  Allergies  Allergen Reactions  . Penicillins Other (See Comments)    Possibility; father and brother allergic     Patient Measurements: Height: 5\' 6"  (167.6 cm) Weight: (!) 137.9 kg (304 lb 1.6 oz) IBW/kg (Calculated) : 59.3  Vital Signs: Temp: 98.4 F (36.9 C) (05/17 1130) Temp Source: Oral (05/17 1130) BP: 122/49 (05/17 1130) Pulse Rate: 77 (05/17 1130)  Labs: Recent Labs    11/26/19 0715 11/27/19 0436 11/28/19 0456  HGB  --   --  9.1*  HCT  --   --  31.5*  PLT  --   --  263  LABPROT 27.4* 27.4* 26.5*  INR 2.7* 2.7* 2.5*  CREATININE 1.48* 1.62* 1.61*    Estimated Creatinine Clearance: 45.2 mL/min (A) (by C-G formula based on SCr of 1.61 mg/dL (H)).  Assessment: 73 year old female on warfarin prior to admission for Afib admitted for DCCV and volume overload. DCCV >SR> Afib plan DCCV tomorrow - started on amiodarone - watch for bump in INR Diuresed with furosemide drip + metolazone + spiro   Warfarin dose prior to admission 5 mg daily except 2.5 mg on Sunday.  INR therapeutic this morning at 2.7 again. CBC stable (5/13)  Goal of Therapy:  INR 2-3 Monitor platelets by anticoagulation protocol: Yes   Plan:  Warfarin 5 mg po daily Daily INR Monitor for s/sx of bleeding  Bonnita Nasuti Pharm.D. CPP, BCPS Clinical Pharmacist [phone] 11/28/2019 1:13 PM     Please check AMION for all [place] numbers 11/28/2019

## 2019-11-28 NOTE — TOC Initial Note (Signed)
Transition of Care [facility]) - Initial/Assessment Note    Patient Details  Name: Hannah [name] MRN: [id number] Date of Birth: 31-Aug-1945  Transition of Care [facility]) CM/SW Contact:    Bethena Roys, RN Phone Number: 11/28/2019, 2:08 PM  Clinical Narrative: Patient presented for heart failure. Prior to arrival patient was from home with the support of her daughter. Patient has a primary care provider and she uses [facility] for transportation to and from appointments. Patient states she is working on Human resources officer. Patient has durable medical equipment cane and shower chair in the home. Patient will need bariatric rollator for the home- call placed to [employer] for delivery. Following for orders. Patient is agreeable to home health services. Medicare.gov list provided to patient and the patient chose [facility] for services. Case Manager did make referral and start of care to begin within 24-48 hours post transition home. Case Manager will continue to follow.                  Expected Discharge Plan: [place] Barriers to Discharge: Continued Medical Work up   Patient Goals and CMS Choice Patient states their goals for this hospitalization and ongoing recovery are:: to return home-maintain idependence CMS Medicare.gov Compare Post Acute Care list provided to:: Patient Choice offered to / list presented to : Patient  Expected Discharge Plan and Services Expected Discharge Plan: [place] In-house Referral: NA Discharge Planning Services: CM Consult Post Acute Care Choice: Home Health, Durable Medical Equipment Living arrangements for the past 2 months: Single Family Home                 DME Arranged: Walker rolling with seat DME Agency: [employer] Date DME Agency Contacted: 11/28/19 Time DME Agency Contacted: 1400 Representative spoke with at DME Agency: [place]: PT [place]: [place]  ([place]) Date [place]: 11/28/19 Time [place]: [place] Representative spoke with at [place]: Butch Penny  Prior Living Arrangements/Services Living arrangements for the past 2 months: Single Family Home Lives with:: Adult Children(lives with daughter that is legally blind.) Patient language and need for interpreter reviewed:: Yes Do you feel safe going back to the place where you live?: Yes      Need for Family Participation in Patient Care: Yes (Comment) Care giver support system in place?: Yes (comment) Current home services: DME(Patient has cane and shower chair in the home.) Criminal Activity/Legal Involvement Pertinent to Current Situation/Hospitalization: No - Comment as needed  Activities of Daily Living Home Assistive Devices/Equipment: Environmental consultant (specify type), Cane (specify quad or straight) ADL Screening (condition at time of admission) Patient's cognitive ability adequate to safely complete daily activities?: Yes Is the patient deaf or have difficulty hearing?: No Does the patient have difficulty seeing, even when wearing glasses/contacts?: No Does the patient have difficulty concentrating, remembering, or making decisions?: No Patient able to express need for assistance with ADLs?: Yes Does the patient have difficulty dressing or bathing?: No Independently performs ADLs?: Yes (appropriate for developmental age) Does the patient have difficulty walking or climbing stairs?: Yes Weakness of Legs: Both Weakness of Arms/Hands: None  Permission Sought/Granted Permission sought to share information with : Family Supports, Chartered certified accountant granted to share information with : Yes, Verbal Permission Granted     Permission granted to share info w AGENCY: [facility] and [employer]        Emotional Assessment Appearance:: Appears stated age Attitude/Demeanor/Rapport:  Engaged Affect (typically observed): Appropriate Orientation: :  Oriented to Situation, Oriented to  Time, Oriented to Place, Oriented to Self Alcohol / Substance Use: Not Applicable Psych Involvement: No (comment)  Admission diagnosis:  Acute on chronic diastolic heart failure ([place]) [I50.33] Patient Active Problem List   Diagnosis Date Noted  . Acute on chronic diastolic heart failure ([place]) 11/24/2019  . PAF (paroxysmal atrial fibrillation) ([place]) 11/24/2019  . Hypothyroidism 11/24/2019  . DMII (diabetes mellitus, type 2) ([place]) 11/24/2019  . Acute respiratory failure ([place]) 12/07/2018  . PTSD (post-traumatic stress disorder) 08/29/2017  . Atrial fibrillation ([place]) [I48.91] 06/16/2017  . Long term (current) use of anticoagulants [Z79.01] 06/16/2017  . Chronic venous insufficiency 09/18/2016  . CHF (congestive heart failure) ([place]) 09/13/2016  . Community acquired pneumonia 09/13/2016  . Cellulitis of right lower extremity 09/13/2016   PCP:  Lujean Amel, MD Pharmacy:   [facility] [address] [place] [address] Phone: [phone] Fax: [place], [place] [address] Phone: [phone] Fax: [phone]  Readmission Risk Interventions No flowsheet data found.

## 2019-11-28 NOTE — Anesthesia Preprocedure Evaluation (Signed)
Anesthesia Evaluation    Reviewed: Allergy & Precautions, Patient's Chart, lab work & pertinent test results  Airway        Dental   Pulmonary neg pulmonary ROS,           Cardiovascular hypertension, +CHF  + dysrhythmias Atrial Fibrillation   ECG: rate 77. RBBB   Neuro/Psych PSYCHIATRIC DISORDERS Anxiety PTSD (post-traumatic stress disorder)negative neurological ROS     GI/Hepatic negative GI ROS, Neg liver ROS,   Endo/Other  diabetes, Oral Hypoglycemic AgentsHypothyroidism Morbid obesity  Renal/GU Renal InsufficiencyRenal disease     Musculoskeletal negative musculoskeletal ROS (+)   Abdominal   Peds  Hematology  (+) anemia , HLD   Anesthesia Other Findings A-FIB  Reproductive/Obstetrics                             Anesthesia Physical Anesthesia Plan Anesthesia Quick Evaluation

## 2019-11-28 NOTE — Progress Notes (Signed)
Physical Therapy Treatment Patient Details Name: Hannah [name] MRN: [id number] DOB: 02-Nov-1945 Today's Date: 11/28/2019    History of Present Illness Patient is a 74 y/o female who presents s/p TEE/DC-CV 5/13 and admitted for diuresis.PMH includes bil LE edema, A-fib, DM, HTN, obesity.    PT Comments    Pt received in bed, stating she had a bad night. Agreeable to mobility in room. She required min guard assist supine to sit (HOB elevated, using bed rail), min guard assist sit to stand and min guard assist ambulation 45' with rollator.Pt in recliner with feet elevated at end of session.    Follow Up Recommendations  Home health PT;Supervision - Intermittent     Equipment Recommendations  Other (comment)(bariatric rollator)    Recommendations for Other Services       Precautions / Restrictions Precautions Precautions: Fall Restrictions Weight Bearing Restrictions: No    Mobility  Bed Mobility Overal bed mobility: Needs Assistance Bed Mobility: Supine to Sit     Supine to sit: Min guard;HOB elevated     General bed mobility comments: heavy use of rail, min guard for safety  Transfers Overall transfer level: Needs assistance Equipment used: 4-wheeled walker Transfers: Sit to/from Stand Sit to Stand: Min guard         General transfer comment: cues for hand placement, pt used momentum to power up, min guard for safety  Ambulation/Gait Ambulation/Gait assistance: Min guard Gait Distance (Feet): 60 Feet Assistive device: 4-wheeled walker Gait Pattern/deviations: Step-through pattern;Decreased stride length Gait velocity: mildly decreased Gait velocity interpretation: 1.31 - 2.62 ft/sec, indicative of limited community ambulator General Gait Details: Ambulated in room with rollator. Mildly unsteady gait. Pt demonstrates good control of rollator during turns. She fatigues quickly. Rollator, therefore, more appropriate than RW to provide seat for rest  breaks.   Stairs             Wheelchair Mobility    Modified Rankin (Stroke Patients Only)       Balance Overall balance assessment: Mild deficits observed, not formally tested                                          Cognition Arousal/Alertness: Awake/alert Behavior During Therapy: WFL for tasks assessed/performed Overall Cognitive Status: Within Functional Limits for tasks assessed                                        Exercises      General Comments General comments (skin integrity, edema, etc.): VSS on RA      Pertinent Vitals/Pain Pain Assessment: Faces Faces Pain Scale: Hurts little more Pain Location: generalized LEs Pain Descriptors / Indicators: Discomfort;Sore Pain Intervention(s): Monitored during session;Limited activity within patient's tolerance;Repositioned    Home Living                      Prior Function            PT Goals (current goals can now be found in the care plan section) Acute Rehab PT Goals Patient Stated Goal: home Progress towards PT goals: Progressing toward goals    Frequency    Min 3X/week      PT Plan Current plan remains appropriate    Co-evaluation  AM-PAC PT "6 Clicks" Mobility   Outcome Measure  Help needed turning from your back to your side while in a flat bed without using bedrails?: A Little Help needed moving from lying on your back to sitting on the side of a flat bed without using bedrails?: A Little Help needed moving to and from a bed to a chair (including a wheelchair)?: A Little Help needed standing up from a chair using your arms (e.g., wheelchair or bedside chair)?: A Little Help needed to walk in hospital room?: A Little Help needed climbing 3-5 steps with a railing? : A Little 6 Click Score: 18    End of Session Equipment Utilized During Treatment: Gait belt Activity Tolerance: Patient tolerated treatment well Patient left:  in chair;with call bell/phone within reach Nurse Communication: Mobility status PT Visit Diagnosis: Muscle weakness (generalized) (M62.81);Difficulty in walking, not elsewhere classified (R26.2);Unsteadiness on feet (R26.81)     Time: [id number] PT Time Calculation (min) (ACUTE ONLY): 25 min  Charges:  $Gait Training: 23-37 mins                     Lorrin Goodell, [place]  Office # [phone] Pager [phone]    Lorriane Shire 11/28/2019, 11:21 AM

## 2019-11-28 NOTE — Progress Notes (Signed)
Pt ambulated with RN in room. No distress or complaints noted.

## 2019-11-28 NOTE — Progress Notes (Addendum)
Patient ID: Hannah [name], female   DOB: 14-Feb-1946, 74 y.o.   MRN: [id number]     Advanced Heart Failure Rounding Note  PCP-Cardiologist: Will Meredith Leeds, MD   Subjective:    Admitted after TEE-DCCV for diuresis.  Went back into atrial fibrillation, remains in rate-controlled fibrillation today.    She is now on a Lasix gtt 10 mg/hr, diuresed well yesterday with dose of metolazone and weight down.  Creatinine 1.6 (stable) but BUN higher.  CVP 11-12 this morning.   TEE: Normal left ventricular size and thickness, EF 60-65% with normal wall motion.  The right ventricle was mildly dilated with normal systolic function.  There was a D-shaped interventricular septum consistent with RV pressure/volume overload.  The left atrium was moderately dilated, no LA appendage thrombus. Normal right atrium.  No evidence for PFO/ASD by color doppler.  There was moderate mitral regurgitation, ERO 0.19 cm^2 by PISA.  No systolic flow reversal on the pulmonary vein doppler pattern.  Trileaflet aortic valve with no stenosis or regurgitation.  Moderate TR, peak RV-RA gradient 36 mmHg.    Objective:   Weight Range: (!) 137.9 kg Body mass index is 49.08 kg/m.   Vital Signs:   Temp:  [97.8 F (36.6 C)-99.1 F (37.3 C)] 98.3 F (36.8 C) (05/17 0802) Pulse Rate:  [68-75] 75 (05/17 0945) Resp:  [16-20] 18 (05/17 0802) BP: (105-141)/(55-78) 141/74 (05/17 0802) SpO2:  [92 %-97 %] 95 % (05/17 0945) Weight:  [137.9 kg] 137.9 kg (05/17 0510) Last BM Date: 11/25/19  Weight change: Filed Weights   11/27/19 0500 11/27/19 0604 11/28/19 0510  Weight: (!) 142.7 kg (!) 142.5 kg (!) 137.9 kg    Intake/Output:   Intake/Output Summary (Last 24 hours) at 11/28/2019 1105 Last data filed at 11/28/2019 0900 Gross per 24 hour  Intake 1759.76 ml  Output 5000 ml  Net -3240.24 ml      Physical Exam    General: NAD, obese Neck: JVP 10-12 cm, no thyromegaly or thyroid nodule.  Lungs: Clear to auscultation  bilaterally with normal respiratory effort. CV: Nonpalpable PMI.  Heart irregular S1/S2, no S3/S4, no murmur.  Legs wrapped, 1+ edema to knees.   Abdomen: Soft, nontender, no hepatosplenomegaly, no distention.  Skin: Intact without lesions or rashes.  Neurologic: Alert and oriented x 3.  Psych: Normal affect. Extremities: No clubbing or cyanosis.  HEENT: Normal.    Telemetry   atrial fibrillation rate 70s (personally reviewed)  Labs    CBC Recent Labs    11/28/19 0456  WBC 5.3  NEUTROABS 3.8  HGB 9.1*  HCT 31.5*  MCV 84.5  PLT 875   Basic Metabolic Panel Recent Labs    11/26/19 0715 11/26/19 0715 11/27/19 0436 11/28/19 0456  NA 140   < > 140 141  K 3.6   < > 3.7 4.0  CL 99   < > 96* 95*  CO2 32   < > 34* 37*  GLUCOSE 101*   < > 158* 121*  BUN 31*   < > 30* 37*  CREATININE 1.48*   < > 1.62* 1.61*  CALCIUM 8.6*   < > 8.4* 8.8*  MG 2.2  --   --   --    < > = values in this interval not displayed.   Liver Function Tests No results for input(s): AST, ALT, ALKPHOS, BILITOT, PROT, ALBUMIN in the last 72 hours. No results for input(s): LIPASE, AMYLASE in the last 72 hours. Cardiac Enzymes No results for  input(s): CKTOTAL, CKMB, CKMBINDEX, TROPONINI in the last 72 hours.  BNP: BNP (last 3 results) No results for input(s): BNP in the last 8760 hours.  ProBNP (last 3 results) No results for input(s): PROBNP in the last 8760 hours.   D-Dimer No results for input(s): DDIMER in the last 72 hours. Hemoglobin A1C No results for input(s): HGBA1C in the last 72 hours. Fasting Lipid Panel No results for input(s): CHOL, HDL, LDLCALC, TRIG, CHOLHDL, LDLDIRECT in the last 72 hours. Thyroid Function Tests No results for input(s): TSH, T4TOTAL, T3FREE, THYROIDAB in the last 72 hours.  Invalid input(s): FREET3  Other results:   Imaging    No results found.   Medications:     Scheduled Medications: . busPIRone  15 mg Oral TID  . Chlorhexidine Gluconate Cloth   6 each Topical Daily  . DULoxetine  90 mg Oral Daily  . levothyroxine  75 mcg Oral Q0600  . loratadine  10 mg Oral QPM  . metFORMIN  500 mg Oral Q breakfast  . metolazone  2.5 mg Oral Once  . montelukast  10 mg Oral QPM  . potassium chloride  40 mEq Oral BID  . rosuvastatin  5 mg Oral Daily  . sodium chloride flush  10-40 mL Intracatheter Q12H  . sodium chloride flush  3 mL Intravenous Q12H  . spironolactone  12.5 mg Oral Daily  . traZODone  200 mg Oral QHS  . traZODone  50 mg Oral Once  . Warfarin - Pharmacist Dosing Inpatient   Does not apply q1600    Infusions: . sodium chloride    . amiodarone 30 mg/hr (11/28/19 0300)  . furosemide (LASIX) infusion 10 mg/hr (11/28/19 0700)    PRN Medications: sodium chloride, acetaminophen, ondansetron (ZOFRAN) IV, polyvinyl alcohol, sodium chloride flush, sodium chloride flush   Assessment/Plan   1. Acute on chronic diastolic CHF: TEE with LV EF 60-65%, normal RV function but D-shaped septum, moderate MR.  Good diuresis yesterday but still looks volume overloaded.  CVP 11-12. Stable creatinine but higher BUN.  - Continue Lasix gtt 10 mg/hr.  Will give another dose of metolazone 2.5 x 1 today. Probably one more day of aggressive diuresis.  - Continue spironolactone 12.5 daily.  - Unna boots. - Fluid restrict. - Would be good Cardiomems candidate.  2. CKD: Stage 3.  Creatinine fairly stable today at 1.6 but BUN higher.   3. Atrial fibrillation: Paroxysmal.  TEE-guided DCCV on 5/13.  She went back into atrial fibrillation after admission.  - Continue warfarin, INR therapeutic.  - Continue IV amidarone, then DCCV again tomorrow after further diuresis.  Discussed risks/benefits and she agrees to procedure.   4. Ankle pain: She was supposed to be seeing an orthopedic surgeon about her left ankle, ?sprain.  - Will order plain films.   Length of Stay: [place], MD  11/28/2019, 11:05 AM  Advanced Heart Failure Team Pager [phone]  (M-F; 7a - 4p)  Please contact [facility] for night-coverage after hours (4p -7a ) and weekends on [email or web address]

## 2019-11-28 NOTE — H&P (View-Only) (Signed)
Patient ID: Hannah [name], female   DOB: 08/25/1945, 74 y.o.   MRN: [id number]     Advanced Heart Failure Rounding Note  PCP-Cardiologist: Will Meredith Leeds, MD   Subjective:    Admitted after TEE-DCCV for diuresis.  Went back into atrial fibrillation, remains in rate-controlled fibrillation today.    She is now on a Lasix gtt 10 mg/hr, diuresed well yesterday with dose of metolazone and weight down.  Creatinine 1.6 (stable) but BUN higher.  CVP 11-12 this morning.   TEE: Normal left ventricular size and thickness, EF 60-65% with normal wall motion.  The right ventricle was mildly dilated with normal systolic function.  There was a D-shaped interventricular septum consistent with RV pressure/volume overload.  The left atrium was moderately dilated, no LA appendage thrombus. Normal right atrium.  No evidence for PFO/ASD by color doppler.  There was moderate mitral regurgitation, ERO 0.19 cm^2 by PISA.  No systolic flow reversal on the pulmonary vein doppler pattern.  Trileaflet aortic valve with no stenosis or regurgitation.  Moderate TR, peak RV-RA gradient 36 mmHg.    Objective:   Weight Range: (!) 137.9 kg Body mass index is 49.08 kg/m.   Vital Signs:   Temp:  [97.8 F (36.6 C)-99.1 F (37.3 C)] 98.3 F (36.8 C) (05/17 0802) Pulse Rate:  [68-75] 75 (05/17 0945) Resp:  [16-20] 18 (05/17 0802) BP: (105-141)/(55-78) 141/74 (05/17 0802) SpO2:  [92 %-97 %] 95 % (05/17 0945) Weight:  [137.9 kg] 137.9 kg (05/17 0510) Last BM Date: 11/25/19  Weight change: Filed Weights   11/27/19 0500 11/27/19 0604 11/28/19 0510  Weight: (!) 142.7 kg (!) 142.5 kg (!) 137.9 kg    Intake/Output:   Intake/Output Summary (Last 24 hours) at 11/28/2019 1105 Last data filed at 11/28/2019 0900 Gross per 24 hour  Intake 1759.76 ml  Output 5000 ml  Net -3240.24 ml      Physical Exam    General: NAD, obese Neck: JVP 10-12 cm, no thyromegaly or thyroid nodule.  Lungs: Clear to auscultation  bilaterally with normal respiratory effort. CV: Nonpalpable PMI.  Heart irregular S1/S2, no S3/S4, no murmur.  Legs wrapped, 1+ edema to knees.   Abdomen: Soft, nontender, no hepatosplenomegaly, no distention.  Skin: Intact without lesions or rashes.  Neurologic: Alert and oriented x 3.  Psych: Normal affect. Extremities: No clubbing or cyanosis.  HEENT: Normal.    Telemetry   atrial fibrillation rate 70s (personally reviewed)  Labs    CBC Recent Labs    11/28/19 0456  WBC 5.3  NEUTROABS 3.8  HGB 9.1*  HCT 31.5*  MCV 84.5  PLT 883   Basic Metabolic Panel Recent Labs    11/26/19 0715 11/26/19 0715 11/27/19 0436 11/28/19 0456  NA 140   < > 140 141  K 3.6   < > 3.7 4.0  CL 99   < > 96* 95*  CO2 32   < > 34* 37*  GLUCOSE 101*   < > 158* 121*  BUN 31*   < > 30* 37*  CREATININE 1.48*   < > 1.62* 1.61*  CALCIUM 8.6*   < > 8.4* 8.8*  MG 2.2  --   --   --    < > = values in this interval not displayed.   Liver Function Tests No results for input(s): AST, ALT, ALKPHOS, BILITOT, PROT, ALBUMIN in the last 72 hours. No results for input(s): LIPASE, AMYLASE in the last 72 hours. Cardiac Enzymes No results for  input(s): CKTOTAL, CKMB, CKMBINDEX, TROPONINI in the last 72 hours.  BNP: BNP (last 3 results) No results for input(s): BNP in the last 8760 hours.  ProBNP (last 3 results) No results for input(s): PROBNP in the last 8760 hours.   D-Dimer No results for input(s): DDIMER in the last 72 hours. Hemoglobin A1C No results for input(s): HGBA1C in the last 72 hours. Fasting Lipid Panel No results for input(s): CHOL, HDL, LDLCALC, TRIG, CHOLHDL, LDLDIRECT in the last 72 hours. Thyroid Function Tests No results for input(s): TSH, T4TOTAL, T3FREE, THYROIDAB in the last 72 hours.  Invalid input(s): FREET3  Other results:   Imaging    No results found.   Medications:     Scheduled Medications: . busPIRone  15 mg Oral TID  . Chlorhexidine Gluconate Cloth   6 each Topical Daily  . DULoxetine  90 mg Oral Daily  . levothyroxine  75 mcg Oral Q0600  . loratadine  10 mg Oral QPM  . metFORMIN  500 mg Oral Q breakfast  . metolazone  2.5 mg Oral Once  . montelukast  10 mg Oral QPM  . potassium chloride  40 mEq Oral BID  . rosuvastatin  5 mg Oral Daily  . sodium chloride flush  10-40 mL Intracatheter Q12H  . sodium chloride flush  3 mL Intravenous Q12H  . spironolactone  12.5 mg Oral Daily  . traZODone  200 mg Oral QHS  . traZODone  50 mg Oral Once  . Warfarin - Pharmacist Dosing Inpatient   Does not apply q1600    Infusions: . sodium chloride    . amiodarone 30 mg/hr (11/28/19 0300)  . furosemide (LASIX) infusion 10 mg/hr (11/28/19 0700)    PRN Medications: sodium chloride, acetaminophen, ondansetron (ZOFRAN) IV, polyvinyl alcohol, sodium chloride flush, sodium chloride flush   Assessment/Plan   1. Acute on chronic diastolic CHF: TEE with LV EF 60-65%, normal RV function but D-shaped septum, moderate MR.  Good diuresis yesterday but still looks volume overloaded.  CVP 11-12. Stable creatinine but higher BUN.  - Continue Lasix gtt 10 mg/hr.  Will give another dose of metolazone 2.5 x 1 today. Probably one more day of aggressive diuresis.  - Continue spironolactone 12.5 daily.  - Unna boots. - Fluid restrict. - Would be good Cardiomems candidate.  2. CKD: Stage 3.  Creatinine fairly stable today at 1.6 but BUN higher.   3. Atrial fibrillation: Paroxysmal.  TEE-guided DCCV on 5/13.  She went back into atrial fibrillation after admission.  - Continue warfarin, INR therapeutic.  - Continue IV amidarone, then DCCV again tomorrow after further diuresis.  Discussed risks/benefits and she agrees to procedure.   4. Ankle pain: She was supposed to be seeing an orthopedic surgeon about her left ankle, ?sprain.  - Will order plain films.   Length of Stay: [place], MD  11/28/2019, 11:05 AM  Advanced Heart Failure Team Pager [phone]  (M-F; 7a - 4p)  Please contact [facility] for night-coverage after hours (4p -7a ) and weekends on [email or web address]

## 2019-11-28 NOTE — Evaluation (Signed)
Occupational Therapy Evaluation Patient Details Name: Hannah [name] MRN: [id number] DOB: [birth year] Today's Date: 11/28/2019    History of Present Illness Patient is a 74 y/o female who presents s/p TEE/DC-CV 5/13 and admitted for diuresis.PMH includes bil LE edema, A-fib, DM, HTN, obesity.   Clinical Impression   This 74 yo female admitted with above presents to acute OT with overall moving fairly well, able to go from sit<>stand and ambulate short distances with RW with min guard A . Pt is struggling with LBADLs due to still with increased fluid and una boots. She will benefit from acute OT with HHOT recommended. I also gave patient information on the [place] program for her to follow up with on her own if interested.     Follow Up Recommendations  Home health OT;Supervision/Assistance - 24 hour    Equipment Recommendations  None recommended by OT       Precautions / Restrictions Precautions Precautions: Fall Restrictions Weight Bearing Restrictions: No      Mobility Bed Mobility Overal bed mobility: Needs Assistance Bed Mobility: Supine to Sit     Supine to sit: Min guard;HOB elevated     General bed mobility comments: Pt up in recliner upon my arrival  Transfers Overall transfer level: Needs assistance Equipment used: 4-wheeled walker Transfers: Sit to/from Stand Sit to Stand: Min guard         General transfer comment: min guard  A steps forward and backward from recliner (pt politely declined ambulating to bathroom--did not want purewick out)    Balance Overall balance assessment: Mild deficits observed, not formally tested                                         ADL either performed or assessed with clinical judgement   ADL Overall ADL's : Needs assistance/impaired Eating/Feeding: Independent;Sitting   Grooming: Set up;Sitting   Upper Body Bathing: Set up;Sitting   Lower Body Bathing: Minimal assistance;Sit to/from  stand   Upper Body Dressing : Set up;Sitting   Lower Body Dressing: Minimal assistance;Sit to/from stand Lower Body Dressing Details (indicate cue type and reason): props foot up on stool to get to feet Toilet Transfer: Min guard;Ambulation;RW   Toileting- Clothing Manipulation and Hygiene: Minimal assistance Toileting - Clothing Manipulation Details (indicate cue type and reason): min guard A sit<>stand             Vision Patient Visual Report: No change from baseline              Pertinent Vitals/Pain Pain Assessment: Faces Faces Pain Scale: Hurts a little bit Pain Location: generalized LEs Pain Descriptors / Indicators: Discomfort Pain Intervention(s): Monitored during session;Repositioned     Hand Dominance Right   Extremity/Trunk Assessment Upper Extremity Assessment Upper Extremity Assessment: Overall WFL for tasks assessed           Communication Communication Communication: No difficulties   Cognition Arousal/Alertness: Awake/alert Behavior During Therapy: WFL for tasks assessed/performed Overall Cognitive Status: Within Functional Limits for tasks assessed                                     General Comments  VSS on RA            Home Living Family/patient expects to be discharged to:: Private residence Living Arrangements: Children  Available Help at Discharge: Family;Available PRN/intermittently Type of Home: House Home Access: Stairs to enter [employer] of Steps: 2 Entrance Stairs-Rails: None Home Layout: One level     Bathroom Shower/Tub: Walk-in shower         Home Equipment: Kasandra Knudsen - single point;Shower seat          Prior Functioning/Environment Level of Independence: Independent with assistive device(s)        Comments: Furniture walker. Uses SPC PRN. Limited walking in home in last 3-4 months. 2 falls in last 6 months. Been sleeping in chair. Daughter assists with IADLs. Pt does not drive.         OT Problem List: Decreased strength;Decreased range of motion;Obesity;Decreased knowledge of use of DME or AE      OT Treatment/Interventions: Self-care/ADL training;DME and/or AE instruction;Patient/family education;Balance training    OT Goals(Current goals can be found in the care plan section) Acute Rehab OT Goals Patient Stated Goal: to go home OT Goal Formulation: With patient Time For Goal Achievement: 12/12/19 Potential to Achieve Goals: Good  OT Frequency: Min 2X/week              AM-PAC OT "6 Clicks" Daily Activity     Outcome Measure Help from another person eating meals?: None Help from another person taking care of personal grooming?: A Little Help from another person toileting, which includes using toliet, bedpan, or urinal?: A Little Help from another person bathing (including washing, rinsing, drying)?: A Little Help from another person to put on and taking off regular upper body clothing?: A Little Help from another person to put on and taking off regular lower body clothing?: A Little 6 Click Score: 19   End of Session Equipment Utilized During Treatment: (rollator)  Activity Tolerance: Patient tolerated treatment well Patient left: in chair;with call bell/phone within reach  OT Visit Diagnosis: Unsteadiness on feet (R26.81);Other abnormalities of gait and mobility (R26.89);Muscle weakness (generalized) (M62.81);Pain Pain - Right/Left: (both) Pain - part of body: (lower legs)                Time: 2094-7096 OT Time Calculation (min): 29 min Charges:  OT General Charges $OT Visit: 1 Visit OT Evaluation $OT Eval Moderate Complexity: 1 Mod OT Treatments $Self Care/Home Management : 8-22 mins  [place]en Circle, OTR/L Acute [employer] Pager [phone] Office [phone]     Almon Register 11/28/2019, 1:37 PM

## 2019-11-29 ENCOUNTER — Encounter (HOSPITAL_COMMUNITY)
Admission: RE | Disposition: A | Discharge status: Home / Self Care | Payer: Self-pay | Source: Home / Self Care | Attending: Cardiology

## 2019-11-29 ENCOUNTER — Inpatient Hospital Stay (HOSPITAL_COMMUNITY): Payer: PPO | Admitting: Anesthesiology

## 2019-11-29 ENCOUNTER — Encounter (HOSPITAL_COMMUNITY): Payer: Self-pay | Admitting: Cardiology

## 2019-11-29 DIAGNOSIS — I4892 Unspecified atrial flutter: Secondary | ICD-10-CM

## 2019-11-29 HISTORY — PX: CARDIOVERSION: SHX1299

## 2019-11-29 LAB — CBC WITH DIFFERENTIAL/PLATELET
Abs Immature Granulocytes: 0.02 10*3/uL (ref 0.00–0.07)
Basophils Absolute: 0 10*3/uL (ref 0.0–0.1)
Basophils Relative: 1 %
Eosinophils Absolute: 0.1 10*3/uL (ref 0.0–0.5)
Eosinophils Relative: 3 %
HCT: 31.4 % — ABNORMAL LOW (ref 36.0–46.0)
Hemoglobin: 9.1 g/dL — ABNORMAL LOW (ref 12.0–15.0)
Immature Granulocytes: 0 %
Lymphocytes Relative: 20 %
Lymphs Abs: 1 10*3/uL (ref 0.7–4.0)
MCH: 24.4 pg — ABNORMAL LOW (ref 26.0–34.0)
MCHC: 29 g/dL — ABNORMAL LOW (ref 30.0–36.0)
MCV: 84.2 fL (ref 80.0–100.0)
Monocytes Absolute: 0.3 10*3/uL (ref 0.1–1.0)
Monocytes Relative: 7 %
Neutro Abs: 3.5 10*3/uL (ref 1.7–7.7)
Neutrophils Relative %: 69 %
Platelets: 278 10*3/uL (ref 150–400)
RBC: 3.73 MIL/uL — ABNORMAL LOW (ref 3.87–5.11)
RDW: 16.9 % — ABNORMAL HIGH (ref 11.5–15.5)
WBC: 5 10*3/uL (ref 4.0–10.5)
nRBC: 0 % (ref 0.0–0.2)

## 2019-11-29 LAB — BASIC METABOLIC PANEL
Anion gap: 11 (ref 5–15)
BUN: 45 mg/dL — ABNORMAL HIGH (ref 8–23)
CO2: 37 mmol/L — ABNORMAL HIGH (ref 22–32)
Calcium: 9.1 mg/dL (ref 8.9–10.3)
Chloride: 92 mmol/L — ABNORMAL LOW (ref 98–111)
Creatinine, Ser: 1.84 mg/dL — ABNORMAL HIGH (ref 0.44–1.00)
GFR calc Af Amer: 31 mL/min — ABNORMAL LOW (ref >60)
GFR calc non Af Amer: 27 mL/min — ABNORMAL LOW (ref >60)
Glucose, Bld: 118 mg/dL — ABNORMAL HIGH (ref 70–99)
Potassium: 3.8 mmol/L (ref 3.5–5.1)
Sodium: 140 mmol/L (ref 135–145)

## 2019-11-29 LAB — PROTIME-INR
INR: 2.5 — ABNORMAL HIGH (ref 0.8–1.2)
Prothrombin Time: 26.2 seconds — ABNORMAL HIGH (ref 11.4–15.2)

## 2019-11-29 SURGERY — CARDIOVERSION
Anesthesia: General

## 2019-11-29 SURGERY — CANCELLED PROCEDURE

## 2019-11-29 MED ORDER — PROPOFOL 10 MG/ML IV BOLUS
INTRAVENOUS | Status: DC | PRN
  Administered 2019-11-29: 100 mg via INTRAVENOUS

## 2019-11-29 MED ORDER — WARFARIN SODIUM 5 MG PO TABS
5.0000 mg | ORAL_TABLET | Freq: Every day | ORAL | Status: DC
  Administered 2019-11-29: 5 mg via ORAL
  Filled 2019-11-29: qty 1

## 2019-11-29 MED ORDER — SODIUM CHLORIDE 0.9 % IV SOLN
INTRAVENOUS | Status: AC | PRN
  Administered 2019-11-29: 500 mL via INTRAVENOUS

## 2019-11-29 MED ORDER — LIDOCAINE HCL (CARDIAC) PF 100 MG/5ML IV SOSY
PREFILLED_SYRINGE | INTRAVENOUS | Status: DC | PRN
  Administered 2019-11-29: 60 mg via INTRAVENOUS

## 2019-11-29 MED ORDER — SODIUM CHLORIDE 0.9 % IV SOLN: INTRAVENOUS | Status: DC | PRN

## 2019-11-29 MED ORDER — TORSEMIDE 20 MG PO TABS
60.0000 mg | ORAL_TABLET | Freq: Two times a day (BID) | ORAL | Status: DC
  Administered 2019-11-29: 60 mg via ORAL
  Filled 2019-11-29 (×2): qty 3

## 2019-11-29 NOTE — Transfer of Care (Signed)
Immediate Anesthesia Transfer of Care Note  Patient: Hannah [name]  Procedure(s) Performed: CARDIOVERSION (N/A )  Patient Location: Endoscopy Unit  Anesthesia Type:General  Level of Consciousness: awake, drowsy and patient cooperative  Airway & Oxygen Therapy: Patient Spontanous Breathing  Post-op Assessment: Report given to RN, Post -op Vital signs reviewed and stable and Patient moving all extremities X 4  Post vital signs: Reviewed and stable  Last Vitals:  Vitals Value Taken Time  BP    Temp    Pulse    Resp    SpO2      Last Pain:  Vitals:   11/29/19 0734  TempSrc: Oral  PainSc: 0-No pain      Patients Stated Pain Goal: 0 (30/09/23 3007)  Complications: No apparent anesthesia complications

## 2019-11-29 NOTE — Progress Notes (Signed)
Pt scheduled for cardioversion this morning. Called and nurse said patient in SR. EKG on floor done at 0530 shows SR

## 2019-11-29 NOTE — Anesthesia Postprocedure Evaluation (Signed)
Anesthesia Post Note  Patient: Hannah [name]  Procedure(s) Performed: CARDIOVERSION (N/A )     Patient location during evaluation: Cath Lab Anesthesia Type: General Level of consciousness: awake and alert Pain management: pain level controlled Vital Signs Assessment: post-procedure vital signs reviewed and stable Respiratory status: spontaneous breathing, nonlabored ventilation, respiratory function stable and patient connected to nasal cannula oxygen Cardiovascular status: blood pressure returned to baseline and stable Postop Assessment: no apparent nausea or vomiting Anesthetic complications: no    Last Vitals:  Vitals:   11/29/19 1144 11/29/19 1500  BP: 124/79 127/83  Pulse: 70 (!) 56  Resp:  18  Temp: 36.8 C 37 C  SpO2: 94% 96%    Last Pain:  Vitals:   11/29/19 1500  TempSrc: Oral  PainSc: 0-No pain                 Christiona Siddique P S[place] Tygart

## 2019-11-29 NOTE — Anesthesia Preprocedure Evaluation (Addendum)
Anesthesia Evaluation  Patient identified by MRN, date of birth, ID band Patient awake    Reviewed: Allergy & Precautions, NPO status , Patient's Chart, lab work & pertinent test results  Airway Mallampati: II  TM Distance: >3 FB Neck ROM: Full    Dental  (+) Poor Dentition   Pulmonary neg pulmonary ROS,    Pulmonary exam normal breath sounds clear to auscultation       Cardiovascular hypertension, +CHF  Normal cardiovascular exam+ dysrhythmias Atrial Fibrillation  Rhythm:Regular Rate:Normal  ECG: rate 77. RBBB   Neuro/Psych PSYCHIATRIC DISORDERS Anxiety PTSD (post-traumatic stress disorder)negative neurological ROS     GI/Hepatic negative GI ROS, Neg liver ROS,   Endo/Other  diabetes, Oral Hypoglycemic AgentsHypothyroidism Morbid obesity  Renal/GU Renal InsufficiencyRenal disease     Musculoskeletal negative musculoskeletal ROS (+)   Abdominal (+) + obese,   Peds  Hematology  (+) anemia , HLD   Anesthesia Other Findings atrial flutter  Reproductive/Obstetrics                            Anesthesia Physical  Anesthesia Plan  ASA: III  Anesthesia Plan: General   Post-op Pain Management:    Induction: Intravenous  PONV Risk Score and Plan: 3 and Propofol infusion and Treatment may vary due to age or medical condition  Airway Management Planned: Mask  Additional Equipment:   Intra-op Plan:   Post-operative Plan:   Informed Consent: I have reviewed the patients History and Physical, chart, labs and discussed the procedure including the risks, benefits and alternatives for the proposed anesthesia with the patient or authorized representative who has indicated his/her understanding and acceptance.     Dental advisory given  Plan Discussed with: CRNA  Anesthesia Plan Comments:        Anesthesia Quick Evaluation

## 2019-11-29 NOTE — Progress Notes (Signed)
Pt ambulated with RN. On room air. No complaints or distress noted.

## 2019-11-29 NOTE — Procedures (Signed)
Electrical Cardioversion Procedure Note Hannah [name] [id number] June 06, 1946  Procedure: Electrical Cardioversion Indications:  Atrial Flutter  Procedure Details Consent: Risks of procedure as well as the alternatives and risks of each were explained to the (patient/caregiver).  Consent for procedure obtained. Time Out: Verified patient identification, verified procedure, site/side was marked, verified correct patient position, special equipment/implants available, medications/allergies/relevent history reviewed, required imaging and test results available.  Performed  Patient placed on cardiac monitor, pulse oximetry, supplemental oxygen as necessary.  Sedation given: Propofol per anesthesiology Pacer pads placed anterior and posterior chest.  Cardioverted 1 time(s).  Cardioverted at [place].  Evaluation Findings: Post procedure EKG shows: NSR Complications: None Patient did tolerate procedure well.   Loralie Champagne 11/29/2019, 8:01 AM

## 2019-11-29 NOTE — Progress Notes (Signed)
After large BM, very small amount of blood/stool smear was assessed on bed pad. No signs of active bleeding assessed. Will continue to monitor.

## 2019-11-29 NOTE — Progress Notes (Signed)
ANTICOAGULATION CONSULT NOTE - Follow Up Consult  Pharmacy Consult for Warfarin Indication: atrial fibrillation  Allergies  Allergen Reactions  . Penicillins Other (See Comments)    Possibility; father and brother allergic     Patient Measurements: Height: 5\' 6"  (167.6 cm) Weight: (!) 138.3 kg (305 lb) IBW/kg (Calculated) : 59.3  Vital Signs: Temp: 98.2 F (36.8 C) (05/18 1144) Temp Source: Oral (05/18 1144) BP: 124/79 (05/18 1144) Pulse Rate: 70 (05/18 1144)  Labs: Recent Labs    11/27/19 0436 11/28/19 0456 11/29/19 0511  HGB  --  9.1* 9.1*  HCT  --  31.5* 31.4*  PLT  --  263 278  LABPROT 27.4* 26.5* 26.2*  INR 2.7* 2.5* 2.5*  CREATININE 1.62* 1.61* 1.84*    Estimated Creatinine Clearance: 39.7 mL/min (A) (by C-G formula based on SCr of 1.84 mg/dL (H)).  Assessment: 74 year old female on warfarin prior to admission for Afib admitted for DCCV and volume overload. DCCV >SR> Afib - started on amiodarone - watch for bump in INR S/p DCCV 5/18 > SR  Diuresed with furosemide drip + metolazone + spiro net-10L about 20LB > po torsemide   Warfarin dose prior to admission 5 mg daily except 2.5 mg on Sunday.  INR therapeutic this morning at 2.5 again. CBC stable (5/13)  Goal of Therapy:  INR 2-3 Monitor platelets by anticoagulation protocol: Yes   Plan:  Warfarin 5 mg po daily Daily INR Monitor for s/sx of bleeding  Bonnita Nasuti Pharm.D. CPP, BCPS Clinical Pharmacist [phone] 11/29/2019 2:24 PM     Please check AMION for all [facility] numbers 11/29/2019

## 2019-11-29 NOTE — Progress Notes (Signed)
Patient ID: Hannah [name], female   DOB: 11-Aug-1945, 74 y.o.   MRN: [id number]     Advanced Heart Failure Rounding Note  PCP-Cardiologist: Will Meredith Leeds, MD   Subjective:    Admitted after TEE-DCCV for diuresis.  Went back into atrial fibrillation, repeat DCCV to NSR was done today.  She is now on a Lasix gtt 10 mg/hr, diuresed well again yesterday with dose of metolazone.  Creatinine 1.6 => 1.8. CVP 10.   TEE: Normal left ventricular size and thickness, EF 60-65% with normal wall motion.  The right ventricle was mildly dilated with normal systolic function.  There was a D-shaped interventricular septum consistent with RV pressure/volume overload.  The left atrium was moderately dilated, no LA appendage thrombus. Normal right atrium.  No evidence for PFO/ASD by color doppler.  There was moderate mitral regurgitation, ERO 0.19 cm^2 by PISA.  No systolic flow reversal on the pulmonary vein doppler pattern.  Trileaflet aortic valve with no stenosis or regurgitation.  Moderate TR, peak RV-RA gradient 36 mmHg.    Objective:   Weight Range: (!) 138.3 kg Body mass index is 49.23 kg/m.   Vital Signs:   Temp:  [98 F (36.7 C)-98.4 F (36.9 C)] 98.4 F (36.9 C) (05/18 0734) Pulse Rate:  [66-81] 66 (05/18 0543) Resp:  [9-19] 9 (05/18 0734) BP: (106-137)/(41-92) 118/55 (05/18 0734) SpO2:  [94 %-95 %] 94 % (05/18 0734) Weight:  [138.3 kg] 138.3 kg (05/18 0734) Last BM Date: 11/28/19  Weight change: Filed Weights   11/28/19 0510 11/29/19 0600 11/29/19 0734  Weight: (!) 137.9 kg (!) 138.3 kg (!) 138.3 kg    Intake/Output:   Intake/Output Summary (Last 24 hours) at 11/29/2019 0803 Last data filed at 11/29/2019 0802 Gross per 24 hour  Intake 1591.74 ml  Output 3000 ml  Net -1408.26 ml      Physical Exam    General: NAD, obese Neck: JVP 10-12 cm, no thyromegaly or thyroid nodule.  Lungs: Clear to auscultation bilaterally with normal respiratory effort. CV: Nonpalpable  PMI.  Heart irregular S1/S2, no S3/S4, no murmur.  Legs wrapped, 1+ edema to knees.   Abdomen: Soft, nontender, no hepatosplenomegaly, no distention.  Skin: Intact without lesions or rashes.  Neurologic: Alert and oriented x 3.  Psych: Normal affect. Extremities: No clubbing or cyanosis.  HEENT: Normal.    Telemetry   atrial fibrillation rate 70s => NSR 60s (personally reviewed)  Labs    CBC Recent Labs    11/28/19 0456 11/29/19 0511  WBC 5.3 5.0  NEUTROABS 3.8 3.5  HGB 9.1* 9.1*  HCT 31.5* 31.4*  MCV 84.5 84.2  PLT 263 756   Basic Metabolic Panel Recent Labs    11/28/19 0456 11/29/19 0511  NA 141 140  K 4.0 3.8  CL 95* 92*  CO2 37* 37*  GLUCOSE 121* 118*  BUN 37* 45*  CREATININE 1.61* 1.84*  CALCIUM 8.8* 9.1   Liver Function Tests No results for input(s): AST, ALT, ALKPHOS, BILITOT, PROT, ALBUMIN in the last 72 hours. No results for input(s): LIPASE, AMYLASE in the last 72 hours. Cardiac Enzymes No results for input(s): CKTOTAL, CKMB, CKMBINDEX, TROPONINI in the last 72 hours.  BNP: BNP (last 3 results) No results for input(s): BNP in the last 8760 hours.  ProBNP (last 3 results) No results for input(s): PROBNP in the last 8760 hours.   D-Dimer No results for input(s): DDIMER in the last 72 hours. Hemoglobin A1C No results for input(s): HGBA1C in the  last 72 hours. Fasting Lipid Panel No results for input(s): CHOL, HDL, LDLCALC, TRIG, CHOLHDL, LDLDIRECT in the last 72 hours. Thyroid Function Tests No results for input(s): TSH, T4TOTAL, T3FREE, THYROIDAB in the last 72 hours.  Invalid input(s): FREET3  Other results:   Imaging    DG Ankle Complete Left  Result Date: 11/28/2019 CLINICAL DATA:  Left ankle pain and swelling EXAM: LEFT ANKLE COMPLETE - 3+ VIEW COMPARISON:  09/13/2016 FINDINGS: Generalized soft tissue swelling is noted about the ankle. Calcaneal spurring is seen. Tarsal degenerative changes are noted as well. No acute fracture or  dislocation is seen. IMPRESSION: Generalized soft tissue swelling without acute bony abnormality. Electronically Signed   By: Inez Catalina M.D.   On: 11/28/2019 12:46     Medications:     Scheduled Medications: . [MAR Hold] busPIRone  15 mg Oral TID  . [MAR Hold] Chlorhexidine Gluconate Cloth  6 each Topical Daily  . [MAR Hold] DULoxetine  90 mg Oral Daily  . [MAR Hold] levothyroxine  75 mcg Oral Q0600  . [MAR Hold] loratadine  10 mg Oral QPM  . [MAR Hold] metFORMIN  500 mg Oral Q breakfast  . [MAR Hold] montelukast  10 mg Oral QPM  . [MAR Hold] potassium chloride  40 mEq Oral BID  . [MAR Hold] rosuvastatin  5 mg Oral Daily  . [MAR Hold] sodium chloride flush  10-40 mL Intracatheter Q12H  . [MAR Hold] sodium chloride flush  3 mL Intravenous Q12H  . [MAR Hold] spironolactone  12.5 mg Oral Daily  . [MAR Hold] traZODone  200 mg Oral QHS  . [MAR Hold] traZODone  50 mg Oral Once  . [MAR Hold] Warfarin - Pharmacist Dosing Inpatient   Does not apply q1600    Infusions: . [MAR Hold] sodium chloride    . sodium chloride    . amiodarone Stopped (11/29/19 6270)  . furosemide (LASIX) infusion 10 mg/hr (11/29/19 0630)    PRN Medications: [MAR Hold] sodium chloride, sodium chloride, [MAR Hold] acetaminophen, [MAR Hold] ondansetron (ZOFRAN) IV, [MAR Hold] polyvinyl alcohol, [MAR Hold] sodium chloride flush, [MAR Hold] sodium chloride flush   Assessment/Plan   1. Acute on chronic diastolic CHF: TEE with LV EF 60-65%, normal RV function but D-shaped septum, moderate MR.  Good diuresis yesterday.  CVP down to 10. Creatinine up to 1.8.  - Stop Lasix gtt, start torsemide 60 mg po bid this evening. - Continue spironolactone 12.5 daily.  - Unna boots. - Would be good Cardiomems candidate.  2. AKI on CKD: Stage 3.  Creatinine up to 1.8, as above will stop Lasix gtt and start po torsemide.   3. Atrial fibrillation: Paroxysmal.  TEE-guided DCCV on 5/13.  She went back into atrial fibrillation  after admission. Repeat DCCV today, now back in NSR.  - Continue warfarin, INR therapeutic.  - Continue IV amiodarone overnight, then back to po tomorrow.  4. Ankle pain: Plain films showed no fracture.   If she remains stable in NSR overnight and creatinine stabilizes, can go home tomorrow.    Length of Stay: Babb, MD  11/29/2019, 8:03 AM  Advanced Heart Failure Team Pager [phone] (M-F; 7a - 4p)  Please contact [facility] for night-coverage after hours (4p -7a ) and weekends on [email or web address]

## 2019-11-29 NOTE — Interval H&P Note (Signed)
History and Physical Interval Note:  11/29/2019 7:54 AM  Hannah [name]  has presented today for surgery, with the diagnosis of atrial flutter.  The various methods of treatment have been discussed with the patient and family. After consideration of risks, benefits and other options for treatment, the patient has consented to  Procedure(s): CARDIOVERSION (N/A) as a surgical intervention.  The patient's history has been reviewed, patient examined, no change in status, stable for surgery.  I have reviewed the patient's chart and labs.  Questions were answered to the patient's satisfaction.     Berneta Sconyers [employer]

## 2019-11-30 ENCOUNTER — Encounter (HOSPITAL_COMMUNITY): Payer: Self-pay

## 2019-11-30 ENCOUNTER — Other Ambulatory Visit: Payer: Self-pay

## 2019-11-30 ENCOUNTER — Ambulatory Visit (HOSPITAL_COMMUNITY): Admit: 2019-11-30 | Payer: PPO | Admitting: Cardiovascular Disease

## 2019-11-30 LAB — BASIC METABOLIC PANEL
Anion gap: 12 (ref 5–15)
BUN: 57 mg/dL — ABNORMAL HIGH (ref 8–23)
CO2: 35 mmol/L — ABNORMAL HIGH (ref 22–32)
Calcium: 9.2 mg/dL (ref 8.9–10.3)
Chloride: 92 mmol/L — ABNORMAL LOW (ref 98–111)
Creatinine, Ser: 2.05 mg/dL — ABNORMAL HIGH (ref 0.44–1.00)
GFR calc Af Amer: 27 mL/min — ABNORMAL LOW (ref >60)
GFR calc non Af Amer: 24 mL/min — ABNORMAL LOW (ref >60)
Glucose, Bld: 123 mg/dL — ABNORMAL HIGH (ref 70–99)
Potassium: 3.6 mmol/L (ref 3.5–5.1)
Sodium: 139 mmol/L (ref 135–145)

## 2019-11-30 LAB — CBC WITH DIFFERENTIAL/PLATELET
Abs Immature Granulocytes: 0.02 10*3/uL (ref 0.00–0.07)
Basophils Absolute: 0 10*3/uL (ref 0.0–0.1)
Basophils Relative: 1 %
Eosinophils Absolute: 0.2 10*3/uL (ref 0.0–0.5)
Eosinophils Relative: 3 %
HCT: 30.8 % — ABNORMAL LOW (ref 36.0–46.0)
Hemoglobin: 9 g/dL — ABNORMAL LOW (ref 12.0–15.0)
Immature Granulocytes: 0 %
Lymphocytes Relative: 18 %
Lymphs Abs: 0.9 10*3/uL (ref 0.7–4.0)
MCH: 24.4 pg — ABNORMAL LOW (ref 26.0–34.0)
MCHC: 29.2 g/dL — ABNORMAL LOW (ref 30.0–36.0)
MCV: 83.5 fL (ref 80.0–100.0)
Monocytes Absolute: 0.3 10*3/uL (ref 0.1–1.0)
Monocytes Relative: 6 %
Neutro Abs: 3.7 10*3/uL (ref 1.7–7.7)
Neutrophils Relative %: 72 %
Platelets: 304 10*3/uL (ref 150–400)
RBC: 3.69 MIL/uL — ABNORMAL LOW (ref 3.87–5.11)
RDW: 17 % — ABNORMAL HIGH (ref 11.5–15.5)
WBC: 5.2 10*3/uL (ref 4.0–10.5)
nRBC: 0 % (ref 0.0–0.2)

## 2019-11-30 LAB — PROTIME-INR
INR: 2.4 — ABNORMAL HIGH (ref 0.8–1.2)
Prothrombin Time: 25.7 seconds — ABNORMAL HIGH (ref 11.4–15.2)

## 2019-11-30 SURGERY — ECHOCARDIOGRAM, TRANSESOPHAGEAL
Anesthesia: Monitor Anesthesia Care

## 2019-11-30 MED ORDER — AMIODARONE HCL 200 MG PO TABS
200.0000 mg | ORAL_TABLET | Freq: Two times a day (BID) | ORAL | Status: DC
  Administered 2019-11-30: 200 mg via ORAL
  Filled 2019-11-30: qty 1

## 2019-11-30 MED ORDER — TORSEMIDE 20 MG PO TABS: 60.0000 mg | ORAL_TABLET | Freq: Every day | ORAL | Status: DC

## 2019-11-30 MED ORDER — BARIATRIC ROLLATOR MISC: 0 refills | Status: AC

## 2019-11-30 MED ORDER — TORSEMIDE 20 MG PO TABS: ORAL_TABLET | ORAL | 1 refills | Status: DC

## 2019-11-30 MED ORDER — AMIODARONE HCL 200 MG PO TABS: ORAL_TABLET | ORAL | 1 refills | Status: DC

## 2019-11-30 MED ORDER — SPIRONOLACTONE 25 MG PO TABS: 12.5000 mg | ORAL_TABLET | Freq: Every day | ORAL | 5 refills | Status: DC

## 2019-11-30 NOTE — Care Management (Signed)
7255 11-30-19 Patient will transition home via [employer] once durable medical equipment arrives to the room. Bethena Roys, RN,BSN Case Manager

## 2019-11-30 NOTE — Care Management Important Message (Signed)
Important Message  Patient Details  Name: Le Ferraz MRN: [id number] Date of Birth: 07-07-1946   Medicare Important Message Given:  Yes     Shelda Altes 11/30/2019, 10:35 AM

## 2019-11-30 NOTE — Progress Notes (Signed)
ANTICOAGULATION CONSULT NOTE - Follow Up Consult  Pharmacy Consult for Warfarin Indication: atrial fibrillation  Allergies  Allergen Reactions  . Penicillins Other (See Comments)    Possibility; father and brother allergic     Patient Measurements: Height: 5\' 6"  (167.6 cm) Weight: (!) 138.8 kg (305 lb 14.4 oz) IBW/kg (Calculated) : 59.3  Vital Signs: Temp: 98.5 F (36.9 C) (05/19 0818) Temp Source: Oral (05/19 0818) BP: 142/61 (05/19 0818) Pulse Rate: 64 (05/19 0818)  Labs: Recent Labs    11/28/19 0456 11/28/19 0456 11/29/19 0511 11/30/19 0511  HGB 9.1*   < > 9.1* 9.0*  HCT 31.5*  --  31.4* 30.8*  PLT 263  --  278 304  LABPROT 26.5*  --  26.2* 25.7*  INR 2.5*  --  2.5* 2.4*  CREATININE 1.61*  --  1.84* 2.05*   < > = values in this interval not displayed.    Estimated Creatinine Clearance: 35.7 mL/min (A) (by C-G formula based on SCr of 2.05 mg/dL (H)).  Assessment: 74 year old female on warfarin prior to admission for Afib admitted for DCCV and volume overload. DCCV >SR> Afib - started on amiodarone - watch for bump in INR S/p DCCV 5/18 > SR  Diuresed with furosemide drip + metolazone + spiro net-10L about 20LB > po torsemide   Warfarin dose prior to admission 5 mg daily except 2.5 mg on Sunday.  INR therapeutic this morning at 2.4 again. CBC stable  Goal of Therapy:  INR 2-3 Monitor platelets by anticoagulation protocol: Yes   Plan:  Continue warfarin 5 mg po daily Daily INR Monitor for s/sx of bleeding  Marguerite Olea, [facility] Clinical Pharmacist Phone [phone]  11/30/2019 9:18 AM

## 2019-11-30 NOTE — Progress Notes (Signed)
Patient ID: Hannah [name], female   DOB: 1946/01/27, 74 y.o.   MRN: [id number]     Advanced Heart Failure Rounding Note  PCP-Cardiologist: Will Meredith Leeds, MD   Subjective:    Admitted after TEE-DCCV for diuresis.  Went back into atrial fibrillation, repeat DCCV to NSR was done 5/18.  She is in NSR on telemetry this morning.   IV Lasix stopped yesterday.  Creatinine 1.6 => 1.8 => 2.05. CVP 10.   TEE: Normal left ventricular size and thickness, EF 60-65% with normal wall motion.  The right ventricle was mildly dilated with normal systolic function.  There was a D-shaped interventricular septum consistent with RV pressure/volume overload.  The left atrium was moderately dilated, no LA appendage thrombus. Normal right atrium.  No evidence for PFO/ASD by color doppler.  There was moderate mitral regurgitation, ERO 0.19 cm^2 by PISA.  No systolic flow reversal on the pulmonary vein doppler pattern.  Trileaflet aortic valve with no stenosis or regurgitation.  Moderate TR, peak RV-RA gradient 36 mmHg.    Objective:   Weight Range: (!) 138.8 kg Body mass index is 49.37 kg/m.   Vital Signs:   Temp:  [97.7 F (36.5 C)-98.7 F (37.1 C)] 98.5 F (36.9 C) (05/19 0818) Pulse Rate:  [56-70] 64 (05/19 0818) Resp:  [16-20] 16 (05/19 0818) BP: (110-142)/(51-83) 142/61 (05/19 0818) SpO2:  [94 %-99 %] 97 % (05/19 0818) Weight:  [138.8 kg] 138.8 kg (05/19 0451) Last BM Date: 11/29/19  Weight change: Filed Weights   11/29/19 0600 11/29/19 0734 11/30/19 0451  Weight: (!) 138.3 kg (!) 138.3 kg (!) 138.8 kg    Intake/Output:   Intake/Output Summary (Last 24 hours) at 11/30/2019 0936 Last data filed at 11/30/2019 0700 Gross per 24 hour  Intake 1109.35 ml  Output 1600 ml  Net -490.65 ml      Physical Exam    General: NAD, obese Neck: JVP 8-9 cm, no thyromegaly or thyroid nodule.  Lungs: Clear to auscultation bilaterally with normal respiratory effort. CV: Nondisplaced PMI.  Heart  regular S1/S2, no S3/S4, no murmur.  No peripheral edema.   Abdomen: Soft, nontender, no hepatosplenomegaly, no distention.  Skin: Intact without lesions or rashes.  Neurologic: Alert and oriented x 3.  Psych: Normal affect. Extremities: No clubbing or cyanosis.  HEENT: Normal.    Telemetry   NSR 70s (personally reviewed)  Labs    CBC Recent Labs    11/29/19 0511 11/30/19 0511  WBC 5.0 5.2  NEUTROABS 3.5 3.7  HGB 9.1* 9.0*  HCT 31.4* 30.8*  MCV 84.2 83.5  PLT 278 233   Basic Metabolic Panel Recent Labs    11/29/19 0511 11/30/19 0511  NA 140 139  K 3.8 3.6  CL 92* 92*  CO2 37* 35*  GLUCOSE 118* 123*  BUN 45* 57*  CREATININE 1.84* 2.05*  CALCIUM 9.1 9.2   Liver Function Tests No results for input(s): AST, ALT, ALKPHOS, BILITOT, PROT, ALBUMIN in the last 72 hours. No results for input(s): LIPASE, AMYLASE in the last 72 hours. Cardiac Enzymes No results for input(s): CKTOTAL, CKMB, CKMBINDEX, TROPONINI in the last 72 hours.  BNP: BNP (last 3 results) No results for input(s): BNP in the last 8760 hours.  ProBNP (last 3 results) No results for input(s): PROBNP in the last 8760 hours.   D-Dimer No results for input(s): DDIMER in the last 72 hours. Hemoglobin A1C No results for input(s): HGBA1C in the last 72 hours. Fasting Lipid Panel No results for  input(s): CHOL, HDL, LDLCALC, TRIG, CHOLHDL, LDLDIRECT in the last 72 hours. Thyroid Function Tests No results for input(s): TSH, T4TOTAL, T3FREE, THYROIDAB in the last 72 hours.  Invalid input(s): FREET3  Other results:   Imaging    No results found.   Medications:     Scheduled Medications: . amiodarone  200 mg Oral BID  . busPIRone  15 mg Oral TID  . Chlorhexidine Gluconate Cloth  6 each Topical Daily  . DULoxetine  90 mg Oral Daily  . levothyroxine  75 mcg Oral Q0600  . loratadine  10 mg Oral QPM  . montelukast  10 mg Oral QPM  . potassium chloride  40 mEq Oral BID  . rosuvastatin  5 mg  Oral Daily  . sodium chloride flush  10-40 mL Intracatheter Q12H  . sodium chloride flush  3 mL Intravenous Q12H  . spironolactone  12.5 mg Oral Daily  . torsemide  60 mg Oral Daily  . traZODone  200 mg Oral QHS  . traZODone  50 mg Oral Once  . warfarin  5 mg Oral q1600  . Warfarin - Pharmacist Dosing Inpatient   Does not apply q1600    Infusions: . sodium chloride      PRN Medications: sodium chloride, acetaminophen, ondansetron (ZOFRAN) IV, polyvinyl alcohol, sodium chloride flush, sodium chloride flush   Assessment/Plan   1. Acute on chronic diastolic CHF: TEE with LV EF 60-65%, normal RV function but D-shaped septum, moderate MR.  She has diuresed well here, weight down 25 lbs.  CVP remains 10 but creatinine up to 2.05. I think we have diuresed her as well as we can with IV diuretics given rise in creatinine.  - Hold diuretics this morning, restart torsemide 60 mg po bid this evening. - Continue spironolactone 12.5 daily.  - Will look at her as an outpatient for Cardiomems.   2. AKI on CKD: Stage 3.  Creatinine up to 2.05 with diuresis, hold am torsemide and restart this evening.  3. Atrial fibrillation: Paroxysmal.  TEE-guided DCCV on 5/13.  She went back into atrial fibrillation after admission. Repeat DCCV 5/18, now back in NSR.  - Continue warfarin, INR therapeutic.  - Stop IV amiodarone, start amiodarone 200 mg bid x 10 days then back to 200 mg daily.  4. Ankle pain: Plain films showed no fracture.  5. Disposition: Home today, followup in CHF clinic 10-14 days when BMET.  Cardiac meds: amiodarone 200 mg bid x 10 days then 200 mg daily, warfarin, spironolactone 12.5 daily, torsemide 60 mg bid, Crestor 5 mg daily, KCl 20 bid.   Length of Stay: 6  Loralie Champagne, MD  11/30/2019, 9:36 AM  Advanced Heart Failure Team Pager [phone] (M-F; 7a - 4p)  Please contact [facility] for night-coverage after hours (4p -7a ) and weekends on [email or web address]

## 2019-11-30 NOTE — Care Management (Signed)
1700 11-30-19 Ly[employer] called for patient. Staff transported patient to the [employer] [employer]. No further needs from Case Manager at this time. Bethena Roys, RN,BSN

## 2019-11-30 NOTE — Progress Notes (Signed)
PT Cancellation Note  Patient Details Name: Hannah [name] MRN: [id number] DOB: [birth year]   Cancelled Treatment:    Reason Eval/Treat Not Completed: Other (comment).  Two attempts to see, pt was waiting for time to elapse for PICC removal, then nursing in with paperwork for DC.  Will re-attempt if dc is held later.   Ramond Dial 11/30/2019, 12:24 PM   Mee Hives, PT MS Acute Rehab Dept. Number: [place] [facility] [place]

## 2019-11-30 NOTE — Discharge Summary (Signed)
Advanced Heart Failure Team  Discharge Summary   Patient ID: Hannah [name] MRN: [id number], DOB/AGE: 12-10-1945 74 y.o. Admit date: 11/24/2019 D/C date:     11/30/2019   Primary Discharge Diagnoses:  Atrial Fibrillation Acute on Chronic Diastolic Heart Failure Chronic anticoagulation therapy w/ Coumadin  Stage III CKD  Hospital Course:   74 y/o female w/  atrial fibrillation who was admitted 5/13 for TEE-DCCV for afib and IV diuresis for volume overload.  Went back into atrial fibrillation and required repeat DCCV back to NSR on 5/18.  Placed on amiodarone for rhythm control. Continued on coumadin for a/c. After diuresis, she was transitioned back to PO torsemide.  On 5/19, she was seen and examined by Dr. Aundra Dubin and felt stable for d/c home. PT recommended home health PT. See Detailed problem list outlining conditions managed this admission.   1. Acute on chronic diastolic CHF: TEE with LV EF 60-65%, normal RV function but D-shaped septum, moderate MR.  She has diuresed well here, weight down 25 lbs.  CVP remains 10 but creatinine up to 2.05. I think we have diuresed her as well as we can with IV diuretics given rise in creatinine.  - Hold diuretics this morning, restart torsemide 60 mg po bid this evening. - Continue spironolactone 12.5 daily.  - Will look at her as an outpatient for Cardiomems.   2. AKI on CKD: Stage 3.  Creatinine up to 2.05 with diuresis, hold am torsemide and restart this evening.  3. Atrial fibrillation: Paroxysmal.  TEE-guided DCCV on 5/13.  She went back into atrial fibrillation after admission. Repeat DCCV 5/18, now back in NSR.  - Continue warfarin, INR therapeutic.  - Stop IV amiodarone, start amiodarone 200 mg bid x 10 days then back to 200 mg daily.  4. Ankle pain: Plain films showed no fracture.  5. Disposition: Home today, followup in CHF clinic 10-14 days when BMET.  Cardiac meds: amiodarone 200 mg bid x 10 days then 200 mg daily, warfarin,  spironolactone 12.5 daily, torsemide 60 mg bid, Crestor 5 mg daily, KCl 20 bid.   Discharge Weight Range: 305 lb  Discharge Vitals: Blood pressure 123/61, pulse 60, temperature 98 F (36.7 C), temperature source Oral, resp. rate 18, height 5\' 6"  (1.676 m), weight (!) 138.8 kg, SpO2 94 %.  Labs: Lab Results  Component Value Date   WBC 5.2 11/30/2019   HGB 9.0 (L) 11/30/2019   HCT 30.8 (L) 11/30/2019   MCV 83.5 11/30/2019   PLT 304 11/30/2019    Recent Labs  Lab 11/30/19 0511  NA 139  K 3.6  CL 92*  CO2 35*  BUN 57*  CREATININE 2.05*  CALCIUM 9.2  GLUCOSE 123*   No results found for: CHOL, HDL, LDLCALC, TRIG BNP (last 3 results) No results for input(s): BNP in the last 8760 hours.  ProBNP (last 3 results) No results for input(s): PROBNP in the last 8760 hours.   Diagnostic Studies/Procedures   No results found.  Discharge Medications   Allergies as of 11/30/2019      Reactions   Penicillins Other (See Comments)   Possibility; father and brother allergic       Medication List    STOP taking these medications   metFORMIN 500 MG tablet Commonly known as: GLUCOPHAGE     TAKE these medications   amiodarone 200 MG tablet Commonly known as: PACERONE Take 1 tablet twice a day for 10 days, then reduce down to 1 tablet once daily What  changed:   how much to take  how to take this  when to take this  additional instructions   Bariatric Rollator Misc For home use   busPIRone 15 MG tablet Commonly known as: BUSPAR TAKE 1 TABLET THREE TIMES A DAY   DULoxetine 30 MG capsule Commonly known as: CYMBALTA Take 3 capsules (90 mg total) by mouth daily.   levocetirizine 5 MG tablet Commonly known as: XYZAL Take 5 mg by mouth at bedtime.   levothyroxine 75 MCG tablet Commonly known as: SYNTHROID Take 75 mcg by mouth daily before breakfast.   metolazone 2.5 MG tablet Commonly known as: ZAROXOLYN TAKE ONE TABLET BY MOUTH DAILY AS NEEDED What changed:  reasons to take this   montelukast 10 MG tablet Commonly known as: SINGULAIR Take 10 mg by mouth at bedtime.   Nyamyc powder Generic drug: nystatin Apply 1 application topically 2 (two) times daily as needed (rash under breasts).   Potassium Chloride ER 20 MEQ Tbcr Take 20 mEq by mouth 2 (two) times daily. Take extra 20 meq on days she takes extra diuretic. What changed:   when to take this  additional instructions   rosuvastatin 5 MG tablet Commonly known as: CRESTOR Take 5 mg by mouth daily.   spironolactone 25 MG tablet Commonly known as: ALDACTONE Take 0.5 tablets (12.5 mg total) by mouth daily.   torsemide 20 MG tablet Commonly known as: DEMADEX Take 3 tablets (60 mg total) by mouth every morning AND 3 tablets (60 mg total) every evening. Take extra as needed or directed.. What changed: See the new instructions.   traZODone 100 MG tablet Commonly known as: DESYREL Take 2 tablets (200 mg total) by mouth at bedtime.   triamcinolone ointment 0.1 % Commonly known as: KENALOG Apply 1 application topically See admin instructions. Apply topically twice daily 2 legs for 2 weeks on, 1 week off, repeat   warfarin 5 MG tablet Commonly known as: COUMADIN Take as directed. If you are unsure how to take this medication, talk to your nurse or doctor. Original instructions: Take as directed by Coumadin Clinic. Must keep Coumadin Clinic appt for future refills. What changed:   how much to take  how to take this  when to take this  additional instructions            Durable Medical Equipment  (From admission, onward)         Start     Ordered   11/30/19 1550  For home use only DME 4 wheeled rolling walker with seat  Once    Comments: HD  Question:  Patient needs a walker to treat with the following condition  Answer:  Weakness   11/30/19 1550          Disposition   The patient will be discharged in stable condition to home. Discharge Instructions     Compression stockings   Complete by: As directed    For home use for lower extremity edema   Face-to-face encounter (required for Medicare/Medicaid patients)   Complete by: As directed    I Lyda Jester certify that this patient is under my care and that I, or a nurse practitioner or physician's assistant working with me, had a face-to-face encounter that meets the physician face-to-face encounter requirements with this patient on 11/30/2019. The encounter with the patient was in whole, or in part for the following medical condition(s) which is the primary reason for home health care (List medical condition): chronic diastolic heart  failure   The encounter with the patient was in whole, or in part, for the following medical condition, which is the primary reason for home health care: chronic diastolic heart failure   I certify that, based on my findings, the following services are medically necessary home health services: Physical therapy   Reason for Medically Necessary Home Health Services: Skilled Nursing- Change/Decline in Patient Status   My clinical findings support the need for the above services: Shortness of breath with activity   Further, I certify that my clinical findings support that this patient is homebound due to: Shortness of Breath with activity   Home Health   Complete by: As directed    To provide the following care/treatments: PT     Follow-up Information    [facility].   Specialty: Home Health Services Why: Physical Therapy-office to call with visit times. If you need to ofice please call [phone]       [facility] Follow up.   Why: Bariatric rolling walker with seat delivered to room prior to transition home.  Contact information: [address] [place] [address] [phone]        Larey Dresser, MD Follow up on 12/09/2019.   Specialty: Cardiology Why: 3:20 PM Advanced [facility]  Contact  information: [place] [address] [phone]             Duration of Discharge Encounter: Greater than 35 minutes   Signed, Lyda Jester  11/30/2019, 6:42 PM

## 2019-11-30 NOTE — Progress Notes (Signed)
Discussed HF booklet with pt who voiced understanding. Will defer ambulation to PT today. [id number] [place], ACSM 12:11 PM 11/30/2019

## 2019-11-30 NOTE — Progress Notes (Signed)
Occupational Therapy Treatment Patient Details Name: Hannah [name] MRN: [id number] DOB: October 04, 1945 Today's Date: 11/30/2019    History of present illness Patient is a 74 y/o female who presents s/p TEE/DC-CV 5/13 and admitted for diuresis.PMH includes bil LE edema, A-fib, DM, HTN, obesity.   OT comments  Pt sitting upright in bed speaking with daughter on phone upon arrival. Daughter requested to speak with OT. OT updated daughter on pt's current functional status in regards to self-care and functional mobility. OT answered daughter's questions and reviewed Aging in Place program. Educated pt/daughter on adaptations for shoes since pt c/o difficulty donning/doffing tennis shoes with laces. Pt easily distracted during tx session, requiring v/c's to focus on functional tasks. Nursing present to discuss transportation options with pt. Removed purewick before functional activity, nursing aware. Assessed UB/LB dressing, bed mobility, functional transfer, and balance. Pt setup with short/long sleeve shirt and brief/pants sitting EOB. Pt managed UB dressing independently and required min guard for threading BLEs through pants and pulling over hips in standing. Pt transferred from bed>bedside chair with supervision and chose not to use mobility device for support. OT continues to recommend [place]. Pt most likely discharging this afternoon. Will continue to follow acutely until pt discharges home.    Follow Up Recommendations  Home health OT;Supervision/Assistance - 24 hour    Equipment Recommendations  None recommended by OT    Recommendations for Other Services      Precautions / Restrictions Precautions Precautions: Fall Restrictions Weight Bearing Restrictions: No       Mobility Bed Mobility Overal bed mobility: Modified Independent Bed Mobility: Supine to Sit     Supine to sit: Modified independent (Device/Increase time)(with HOB elevated)     General bed mobility comments: Pt  sitting upright in bed upon arrival; no difficulty transitioning to EOB using bed railing for support  Transfers Overall transfer level: Modified independent     Sit to Stand: Modified independent (Device/Increase time)         General transfer comment: Pt performed functional transfer from bed>bedside chair with Supervison and did not require rollator support; no LOB noted; pt placed hands on bed railing and chair arm rests    Balance Overall balance assessment: Modified Independent                                         ADL either performed or assessed with clinical judgement   ADL Overall ADL's : Needs assistance/impaired     Grooming: Wash/dry hands;Wash/dry face;Oral care;Set up Grooming Details (indicate cue type and reason): Pt reports performing grooming activities earlier this morning before OT arrived         Upper Body Dressing : Set up Upper Body Dressing Details (indicate cue type and reason): Pt doffed hospital gown and donned short sleeve/long sleeve with setup sitting EOB without difficulty Lower Body Dressing: Set up Lower Body Dressing Details (indicate cue type and reason): Pt required standby assist for threading BLEs through pants; pt able to pull pants up and over hips in standing position Toilet Transfer: Supervision/safety;Ambulation Toilet Transfer Details (indicate cue type and reason): Pt transferred from bed>bedside chair, taking 4 steps without mobility device requiring supervision for safety         Functional mobility during ADLs: Supervision/safety General ADL Comments: Pt requires supervison for most BADLs for safety, especially while standing and moving,     Vision  Perception     Praxis      Cognition Arousal/Alertness: Awake/alert Behavior During Therapy: WFL for tasks assessed/performed Overall Cognitive Status: Within Functional Limits for tasks assessed                                  General Comments: easily distracted; required v/c's for attention and focusing on functional tasks        Exercises     Shoulder Instructions       General Comments Pt on phone with daughter upon arrival; daughter requested to speak to therapist; extended time taken to update daughter with pt's current functional status in regards to self-care and functional mobility; educated pt and daughter on potential AE/DME for home and reviewed Aging in Place program    Pertinent Vitals/ Pain       Pain Assessment: No/denies pain  Home Living                                          Prior Functioning/Environment              Frequency  Min 2X/week        Progress Toward Goals  OT Goals(current goals can now be found in the care plan section)  Progress towards OT goals: Progressing toward goals  Acute Rehab OT Goals Patient Stated Goal: to go home OT Goal Formulation: With patient/family  Plan Discharge plan remains appropriate    Co-evaluation                 AM-PAC OT "6 Clicks" Daily Activity     Outcome Measure   Help from another person eating meals?: None Help from another person taking care of personal grooming?: None Help from another person toileting, which includes using toliet, bedpan, or urinal?: A Little Help from another person bathing (including washing, rinsing, drying)?: A Little Help from another person to put on and taking off regular upper body clothing?: None Help from another person to put on and taking off regular lower body clothing?: A Little 6 Click Score: 21    End of Session    OT Visit Diagnosis: Unsteadiness on feet (R26.81);Other abnormalities of gait and mobility (R26.89);Muscle weakness (generalized) (M62.81);Pain   Activity Tolerance Patient tolerated treatment well   Patient Left in chair;with call bell/phone within reach   Nurse Communication Mobility status        Time: 1206-1242 OT Time  Calculation (min): 36 min  Charges: OT General Charges $OT Visit: 1 Visit OT Treatments $Self Care/Home Management : 8-22 mins $Therapeutic Activity: 8-22 mins  Michel Bickers, OTR/L [facility] [place] 11/30/2019, 2:24 PM

## 2019-12-01 ENCOUNTER — Telehealth (HOSPITAL_COMMUNITY): Payer: Self-pay | Admitting: *Deleted

## 2019-12-01 NOTE — Telephone Encounter (Signed)
She can resume metformin

## 2019-12-01 NOTE — Telephone Encounter (Signed)
Pts daughter left VM wanting to know why patient was told to stop metformin. pts DCCV discharge papers on 5/18 says patient needs to stop metformin.   Routed to [place] for advice

## 2019-12-02 DIAGNOSIS — I4891 Unspecified atrial fibrillation: Secondary | ICD-10-CM | POA: Diagnosis not present

## 2019-12-02 DIAGNOSIS — E039 Hypothyroidism, unspecified: Secondary | ICD-10-CM | POA: Diagnosis not present

## 2019-12-02 DIAGNOSIS — N1832 Chronic kidney disease, stage 3b: Secondary | ICD-10-CM | POA: Diagnosis not present

## 2019-12-02 DIAGNOSIS — E7800 Pure hypercholesterolemia, unspecified: Secondary | ICD-10-CM | POA: Diagnosis not present

## 2019-12-02 DIAGNOSIS — I5032 Chronic diastolic (congestive) heart failure: Secondary | ICD-10-CM | POA: Diagnosis not present

## 2019-12-02 DIAGNOSIS — E119 Type 2 diabetes mellitus without complications: Secondary | ICD-10-CM | POA: Diagnosis not present

## 2019-12-02 DIAGNOSIS — Z7984 Long term (current) use of oral hypoglycemic drugs: Secondary | ICD-10-CM | POA: Diagnosis not present

## 2019-12-02 NOTE — Telephone Encounter (Signed)
Patient advised via vm..told to call back if any other questions

## 2019-12-09 ENCOUNTER — Other Ambulatory Visit: Payer: Self-pay

## 2019-12-09 ENCOUNTER — Encounter (HOSPITAL_COMMUNITY): Payer: Self-pay | Admitting: Cardiology

## 2019-12-09 ENCOUNTER — Ambulatory Visit (HOSPITAL_COMMUNITY)
Admission: RE | Admit: 2019-12-09 | Discharge: 2019-12-09 | Disposition: A | Discharge status: Home / Self Care | Payer: PPO | Source: Ambulatory Visit | Attending: Cardiology | Admitting: Cardiology

## 2019-12-09 VITALS — BP 118/62 | HR 80 | Ht 66.0 in | Wt 302.4 lb

## 2019-12-09 DIAGNOSIS — I872 Venous insufficiency (chronic) (peripheral): Secondary | ICD-10-CM

## 2019-12-09 DIAGNOSIS — I4891 Unspecified atrial fibrillation: Secondary | ICD-10-CM

## 2019-12-09 DIAGNOSIS — Z7901 Long term (current) use of anticoagulants: Secondary | ICD-10-CM | POA: Insufficient documentation

## 2019-12-09 DIAGNOSIS — I5032 Chronic diastolic (congestive) heart failure: Secondary | ICD-10-CM | POA: Diagnosis not present

## 2019-12-09 DIAGNOSIS — E785 Hyperlipidemia, unspecified: Secondary | ICD-10-CM | POA: Insufficient documentation

## 2019-12-09 DIAGNOSIS — I130 Hypertensive heart and chronic kidney disease with heart failure and stage 1 through stage 4 chronic kidney disease, or unspecified chronic kidney disease: Secondary | ICD-10-CM | POA: Diagnosis not present

## 2019-12-09 DIAGNOSIS — Z79899 Other long term (current) drug therapy: Secondary | ICD-10-CM | POA: Insufficient documentation

## 2019-12-09 DIAGNOSIS — E1122 Type 2 diabetes mellitus with diabetic chronic kidney disease: Secondary | ICD-10-CM | POA: Insufficient documentation

## 2019-12-09 DIAGNOSIS — E039 Hypothyroidism, unspecified: Secondary | ICD-10-CM | POA: Diagnosis not present

## 2019-12-09 DIAGNOSIS — I4819 Other persistent atrial fibrillation: Secondary | ICD-10-CM | POA: Insufficient documentation

## 2019-12-09 LAB — TSH: TSH: 6.856 u[IU]/mL — ABNORMAL HIGH (ref 0.350–4.500)

## 2019-12-09 LAB — COMPREHENSIVE METABOLIC PANEL
ALT: 22 U/L (ref 0–44)
AST: 30 U/L (ref 15–41)
Albumin: 3.8 g/dL (ref 3.5–5.0)
Alkaline Phosphatase: 50 U/L (ref 38–126)
Anion gap: 14 (ref 5–15)
BUN: 78 mg/dL — ABNORMAL HIGH (ref 8–23)
CO2: 31 mmol/L (ref 22–32)
Calcium: 9.3 mg/dL (ref 8.9–10.3)
Chloride: 94 mmol/L — ABNORMAL LOW (ref 98–111)
Creatinine, Ser: 2.3 mg/dL — ABNORMAL HIGH (ref 0.44–1.00)
GFR calc Af Amer: 24 mL/min — ABNORMAL LOW (ref >60)
GFR calc non Af Amer: 21 mL/min — ABNORMAL LOW (ref >60)
Glucose, Bld: 102 mg/dL — ABNORMAL HIGH (ref 70–99)
Potassium: 4.1 mmol/L (ref 3.5–5.1)
Sodium: 139 mmol/L (ref 135–145)
Total Bilirubin: 1 mg/dL (ref 0.3–1.2)
Total Protein: 8.4 g/dL — ABNORMAL HIGH (ref 6.5–8.1)

## 2019-12-09 LAB — CBC
HCT: 35.5 % — ABNORMAL LOW (ref 36.0–46.0)
Hemoglobin: 10.5 g/dL — ABNORMAL LOW (ref 12.0–15.0)
MCH: 24.3 pg — ABNORMAL LOW (ref 26.0–34.0)
MCHC: 29.6 g/dL — ABNORMAL LOW (ref 30.0–36.0)
MCV: 82.2 fL (ref 80.0–100.0)
Platelets: 286 10*3/uL (ref 150–400)
RBC: 4.32 MIL/uL (ref 3.87–5.11)
RDW: 17.3 % — ABNORMAL HIGH (ref 11.5–15.5)
WBC: 5.4 10*3/uL (ref 4.0–10.5)
nRBC: 0 % (ref 0.0–0.2)

## 2019-12-09 MED ORDER — METOLAZONE 2.5 MG PO TABS: 2.5000 mg | ORAL_TABLET | ORAL | 8 refills | Status: DC

## 2019-12-09 NOTE — Progress Notes (Signed)
Order, OV note, stop bang and demographics all faxed to [employer] at [phone]  

## 2019-12-09 NOTE — Patient Instructions (Addendum)
START Meolazone 2.5mg  (1 tab) weekly on Fridays   You will be referred to Dr Curt Bears for afib ablation.  His office will call you to schedule  This appointment  You will be referred to the [facility] Prep Class.  The coordinator Cyril Mourning will call you to discuss scheduling this class.  Labs today We will only contact you if something comes back abnormal or we need to make some changes. Otherwise no news is good news!   You will be referred for a home sleep study again.  [employer] is the company that does these test.  They will contact you by phone and must speak with you before they can ship the equipment.  Once they have spoken with you they will send the equipment right to your home with instructions on how to set it up.  Once you have completed the test simply box all the equipment back up and mail back to the company.  IF you have any questions or issues with the equipment please call the company directly at [phone].  If your test is positive for sleep apnea and you need a home CPAP machine you will be contacted by Dr Theodosia Blender office [facility]) to set this up.   Your physician recommends that you schedule a follow-up appointment in: 3 weeks with the Nurse Practitioner  Please call office at [phone] option 2 if you have any questions or concerns.   At the [facility], you and your health needs are our priority. As part of our continuing mission to provide you with exceptional heart care, we have created designated Provider Care Teams. These Care Teams include your primary Cardiologist (physician) and Advanced Practice Providers (APPs- Physician Assistants and Nurse Practitioners) who all work together to provide you with the care you need, when you need it.   You may see any of the following providers on your designated Care Team at your next follow up: Marland Kitchen Dr Glori Bickers . Dr Loralie Champagne . Darrick Grinder, NP . Lyda Jester, PA . Audry Riles,  PharmD   Please be sure to bring in all your medications bottles to every appointment.

## 2019-12-10 ENCOUNTER — Telehealth: Payer: Self-pay | Admitting: Physician Assistant

## 2019-12-10 NOTE — Telephone Encounter (Signed)
Called by Dr. Sarita Haver to give instruction to daughter regarding patient.  Serum creatinine minimally up from baseline.  Instructed to hold torsemide for 2 days restart Monday at 60 mg on morning and 40 mg in the evening.  She will need to BMET on 6/2 Wednesday.

## 2019-12-11 NOTE — Progress Notes (Signed)
PCP: Lujean Amel, MD  EP: Dr. Curt Bears HF Cardiology: Dr. Aundra Dubin  74 y.o. with history of atrial fibrillation and chronic diastolic CHF was referred by Dr. Curt Bears for evaluation of CHF.  She moved to [facility] from [place] in 2018.  She was first diagnosed with atrial fibrillation in [place], had cardioversion.  She says that she has been on amiodarone for several years. She has also been taking torsemide for several years.   In 5/21, she underwent TEE-guided DCCV back to NSR and was admitted for diuresis given significant CHF.  She went back into atrial fibrillation and was later cardioverted again to NSR.  She was diuresed with weight down > 30 lbs this admission.   She returns today for followup of CHF and atrial fibrillation.  She is back in atrial fibrillation today with controlled rate.  Breathing better, still short of breath with walking more than about 100 feet.  +Orthopnea.  Snores and has daytime sleepiness.  No chest pain.  No lightheadedness. Still has peripheral edema.   Labs (4/21): TSH elevated 10.9, K 4.3, creatinine 1.17, LFTs normal.  Labs (5/21): K 3.6, creatinine 2.05  ECG (personally reviewed): Atrial fibrillation with RBBB (rate-controlled)  PMH: 1. Type 2 diabetes 2. HTN 3. Atrial fibrillation: Paroxysmal. 1st noted in 2010.  Had DCCV in the past.  - DCCV in 5/21 x 2  4. Chronic diastolic CHF: Echo (2/83) with EF 60-65%, severe LAE.  - TEE (5/21): EF 60-65%, mildly dilated RV with normal systolic function, D-shaped interventricular septum, moderate MR with ERO 0.19 cm^2, moderate TR.  5. Hypothyroidism.   Social History   Socioeconomic History  . Marital status: Legally Separated    Spouse name: Not on file  . Number of children: Not on file  . Years of education: Not on file  . Highest education level: Not on file  Occupational History  . Not on file  Tobacco Use  . Smoking status: Never Smoker  . Smokeless tobacco: Never Used  Substance and Sexual Activity  . Alcohol  use: Yes    Alcohol/week: 1.0 standard drinks    Types: 1 Glasses of wine per week    Comment: occ  . Drug use: No  . Sexual activity: Not Currently  Other Topics Concern  . Not on file  Social History Narrative  . Not on file   Social Determinants of Health   Financial Resource Strain:   . Difficulty of Paying Living Expenses:   Food Insecurity:   . Worried About Charity fundraiser in the Last Year:   . Arboriculturist in the Last Year:   Transportation Needs:   . Film/video editor (Medical):   Marland Kitchen Lack of Transportation (Non-Medical):   Physical Activity:   . Days of Exercise per Week:   . Minutes of Exercise per Session:   Stress:   . Feeling of Stress :   Social Connections:   . Frequency of Communication with Friends and Family:   . Frequency of Social Gatherings with Friends and Family:   . Attends Religious Services:   . Active Member of Clubs or Organizations:   . Attends Archivist Meetings:   Marland Kitchen Marital Status:   Intimate Partner Violence:   . Fear of Current or Ex-Partner:   . Emotionally Abused:   Marland Kitchen Physically Abused:   . Sexually Abused:    Family History  Problem Relation Age of Onset  . Lung cancer Mother   . Colon  cancer Father   . Heart attack Brother   . Heart disease Brother   . Lung cancer Maternal Grandfather   . Heart attack Paternal Grandfather    ROS: All systems reviewed and negative except as per HPI.   Current Outpatient Medications  Medication Sig Dispense Refill  . amiodarone (PACERONE) 200 MG tablet Take 1 tablet twice a day for 10 days, then reduce down to 1 tablet once daily 90 tablet 1  . busPIRone (BUSPAR) 15 MG tablet TAKE 1 TABLET THREE TIMES A DAY (Patient taking differently: Take 15 mg by mouth 3 (three) times daily. ) 270 tablet 1  . DULoxetine (CYMBALTA) 30 MG capsule Take 3 capsules (90 mg total) by mouth daily. 270 capsule 1  . levocetirizine (XYZAL) 5 MG tablet Take 5 mg by mouth at bedtime.     Marland Kitchen  levothyroxine (SYNTHROID, LEVOTHROID) 75 MCG tablet Take 75 mcg by mouth daily before breakfast.     . metolazone (ZAROXOLYN) 2.5 MG tablet Take 1 tablet (2.5 mg total) by mouth once a week. On Fridays 30 tablet 8  . Misc. Devices (BARIATRIC ROLLATOR) MISC For home use 1 each 0  . montelukast (SINGULAIR) 10 MG tablet Take 10 mg by mouth at bedtime.     Marland Kitchen NYAMYC powder Apply 1 application topically 2 (two) times daily as needed (rash under breasts).     . Potassium Chloride ER 20 MEQ TBCR Take 20 mEq by mouth 2 (two) times daily. Take extra 20 meq on days she takes extra diuretic. (Patient taking differently: Take 20 mEq by mouth See admin instructions. Take one tablet (20 meq) by mouth twice daily, take an extra tablet (20 meq) with each dose of metolazone or extra dose of torsemide) 200 tablet 1  . rosuvastatin (CRESTOR) 5 MG tablet Take 5 mg by mouth daily.    Marland Kitchen spironolactone (ALDACTONE) 25 MG tablet Take 0.5 tablets (12.5 mg total) by mouth daily. 30 tablet 5  . torsemide (DEMADEX) 20 MG tablet Take 3 tablets (60 mg total) by mouth every morning AND 3 tablets (60 mg total) every evening. Take extra as needed or directed.. 448 tablet 1  . traZODone (DESYREL) 100 MG tablet Take 2 tablets (200 mg total) by mouth at bedtime. 180 tablet 1  . triamcinolone ointment (KENALOG) 0.1 % Apply 1 application topically See admin instructions. Apply topically twice daily 2 legs for 2 weeks on, 1 week off, repeat    . warfarin (COUMADIN) 5 MG tablet Take as directed by [facility] Clinic. Must keep [facility] appt for future refills. (Patient taking differently: Take 2.5-5 mg by mouth See admin instructions. Take 1/2 tablet (2.5 mg) by mouth on Sundays, take 1 tablet (5 mg) on all other days of the week - or as directed by [facility] Clinic. Must keep [facility] appt for future refills.) 10 tablet 0   No current facility-administered medications for this encounter.   BP 118/62   Pulse 80   Ht 5\' 6"  (1.676  m)   Wt (!) 137.2 kg (302 lb 6.4 oz)   SpO2 95%   BMI 48.81 kg/m  General: NAD Neck: Thick JVP 8 cm, no thyromegaly or thyroid nodule.  Lungs: Clear to auscultation bilaterally with normal respiratory effort. CV: Nondisplaced PMI.  Heart irregular S1/S2, no S3/S4, 1/6 HSM LLSB/apex. 1+ edema to knees bilaterally.  No carotid bruit.  Normal pedal pulses.  Abdomen: Soft, nontender, no hepatosplenomegaly, no distention.  Skin: Intact without lesions or rashes.  Neurologic: Alert and oriented x 3.  Psych: Normal affect. Extremities: No clubbing or cyanosis.  HEENT: Normal.   Assessment/plan: 1. Chronic diastolic CHF: TEE in 4/54 with EF 60-65%, mildly dilated RV with normal function, D-shaped septum, moderate MR.  Suspect significant RV failure due to diastolic CHF and likely untreated OSA.  Recent admission with >30 lbs of diuresis, but still volume overloaded on exam.  NYHA class III symptoms. Diuresis complicated by cardiorenal syndrome.  - Continue torsemide 60 mg bid and will give metolazone 2.5 mg every Friday starting tomorrow.  - Continue KCl 20 bid.  - Follow creatinine closely, needs BMET today and again in 10 days.   2. Atrial fibrillation: Persistent.  She was cardioverted twice on amiodarone in 5/21 but both times she reverted back to atrial fibrillation.  - For now, continue amiodarone 200 mg daily.  LFTs and TSH today.    - With failure to hold NSR on amiodarone (though she can be cardioverted back to NSR), I will refer her back to Dr. Curt Bears for evaluation for atrial fibrillation ablation.  Body habitus will likely make this difficult.   - Continue warfarin. CBC today.  3. OSA: Strongly suspect sleep apnea.  - I am still trying to arrange for home sleep study.  4. Hyperlipidemia: She is on Crestor.  5. Refer to [facility] PREP class.   Loralie Champagne 12/11/2019

## 2019-12-14 ENCOUNTER — Telehealth: Payer: Self-pay

## 2019-12-14 ENCOUNTER — Telehealth (HOSPITAL_COMMUNITY): Payer: Self-pay

## 2019-12-14 ENCOUNTER — Other Ambulatory Visit (HOSPITAL_COMMUNITY): Payer: Self-pay | Admitting: Cardiology

## 2019-12-14 DIAGNOSIS — I5032 Chronic diastolic (congestive) heart failure: Secondary | ICD-10-CM

## 2019-12-14 MED ORDER — TORSEMIDE 20 MG PO TABS: ORAL_TABLET | ORAL | 1 refills | Status: DC

## 2019-12-14 NOTE — Telephone Encounter (Signed)
-----   Message from Larey Dresser, MD sent at 12/11/2019 11:17 PM EDT ----- BUN/creatinine up, hold torsemide for 2 days then restart torsemide 60 qam/40 qpm.  BMET 1 week.

## 2019-12-14 NOTE — Telephone Encounter (Signed)
Pt reports she was called on Friday and was instructed to hold medicine in which she did for 2 days then restarted it at 60/40. Pt will rtc on 6/7 for bmet

## 2019-12-14 NOTE — Telephone Encounter (Signed)
Call placed to patient reference referral to PREP.  Explained program. Interested and can start with 6/8 class.  [facility] member Intake 6/4 at 1pm  Interested in joining [employer] has a silver sneakers membership already. Will use [employer] for transportation and daughter will likely come to gym at same time.

## 2019-12-16 NOTE — Progress Notes (Signed)
Page Report   Patient Details  Name: Hannah [name] MRN: [id number] Date of Birth: 09/12/1945 Age: 74 y.o. PCP: Lujean Amel, MD  Vitals:   12/16/19 1418  BP: 118/80  Pulse: 86  SpO2: 95%  Weight: (!) 304 lb 12.8 oz (138.3 kg)     [facility] Eval - 12/16/19 1400      Referral    Referring Provider  CHF clinic    Reason for referral  Heart Failure;Inactivity    Program Start Date  12/20/19   Tues/thur 1p-215pm x 12 wks     Measurement   Waist Circumference  57 inches    Hip Circumference  55 inches    Body fat  --   Unable to calculate     Information for Trainer   Goals  wt loss, walk without cane     Current Exercise  none    Orthopedic Concerns  knee, shoulder, foot    Pertinent Medical History  CHF, AFib, ?DM? ckd?    Current Barriers  Pain    Restrictions/Precautions  Assistive device;Fall risk    Medications that affect exercise  Medication causing dizziness/drowsiness;Beta blocker      Timed Up and Go (TUGS)   Timed Up and Go  High risk >13 seconds      Mobility and Daily Activities   I find it easy to walk up or down two or more flights of stairs.  1    I have no trouble taking out the trash.  1    I do housework such as vacuuming and dusting on my own without difficulty.  2    I can easily lift a gallon of milk (8lbs).  4    I can easily walk a mile.  1    I have no trouble reaching into high cupboards or reaching down to pick up something from the floor.  4    I do not have trouble doing out-door work such as Armed forces logistics/support/administrative officer, raking leaves, or gardening.  1      Mobility and Daily Activities   I feel younger than my age.  4    I feel independent.  3    I feel energetic.  1    I live an active life.   1    I feel strong.  1    I feel healthy.  4    I feel active as other people my age.  2      How fit and strong are you.   Fit and Strong Total Score  30      Past Medical History:  Diagnosis Date  . Atrial  fibrillation ([place])   . Diabetes mellitus without complication ([place])   . Glaucoma    BILATERAL  . Hypertension   . Thyroid disease   . Volume overload 11/2019   Past Surgical History:  Procedure Laterality Date  . APPENDECTOMY    . CARDIOVERSION  11/24/2019  . CARDIOVERSION N/A 11/24/2019   Procedure: CARDIOVERSION;  Surgeon: Larey Dresser, MD;  Location: [facility] ENDOSCOPY;  Service: Cardiovascular;  Laterality: N/A;  . CARDIOVERSION N/A 11/29/2019   Procedure: CARDIOVERSION;  Surgeon: Larey Dresser, MD;  Location: [facility] ENDOSCOPY;  Service: Cardiovascular;  Laterality: N/A;  . CHOLECYSTECTOMY    . HERNIA REPAIR     UMBILICAL  . LASIK     BILATERAL  . TEE WITHOUT CARDIOVERSION N/A 11/24/2019   Procedure: TRANSESOPHAGEAL ECHOCARDIOGRAM (TEE);  Surgeon: Aundra Dubin,  Elby Showers, MD;  Location: [facility] ENDOSCOPY;  Service: Cardiovascular;  Laterality: N/A;   Social History   Tobacco Use  Smoking Status Never Smoker  Smokeless Tobacco Never Used    Encouraged to be open to coach for dietary changes. Offered SW for supports should she feel she needs them for family dynamics.    Pam Tally Joe 12/16/2019, 2:22 PM

## 2019-12-19 ENCOUNTER — Ambulatory Visit (HOSPITAL_COMMUNITY)
Admission: RE | Admit: 2019-12-19 | Discharge: 2019-12-19 | Disposition: A | Discharge status: Home / Self Care | Payer: PPO | Source: Ambulatory Visit | Attending: Cardiology | Admitting: Cardiology

## 2019-12-19 ENCOUNTER — Other Ambulatory Visit: Payer: Self-pay

## 2019-12-19 DIAGNOSIS — I5032 Chronic diastolic (congestive) heart failure: Secondary | ICD-10-CM | POA: Diagnosis not present

## 2019-12-19 LAB — BASIC METABOLIC PANEL
Anion gap: 14 (ref 5–15)
BUN: 55 mg/dL — ABNORMAL HIGH (ref 8–23)
CO2: 31 mmol/L (ref 22–32)
Calcium: 9.2 mg/dL (ref 8.9–10.3)
Chloride: 91 mmol/L — ABNORMAL LOW (ref 98–111)
Creatinine, Ser: 2.39 mg/dL — ABNORMAL HIGH (ref 0.44–1.00)
GFR calc Af Amer: 23 mL/min — ABNORMAL LOW (ref >60)
GFR calc non Af Amer: 20 mL/min — ABNORMAL LOW (ref >60)
Glucose, Bld: 116 mg/dL — ABNORMAL HIGH (ref 70–99)
Potassium: 3.7 mmol/L (ref 3.5–5.1)
Sodium: 136 mmol/L (ref 135–145)

## 2019-12-23 ENCOUNTER — Telehealth (HOSPITAL_COMMUNITY): Payer: Self-pay

## 2019-12-23 NOTE — Telephone Encounter (Signed)
Patient left a message on the triage vm, requesting her most recent labs(12/19/19). I called the number that was provided by the patient([phone]) back which ended up being her daughter, Tilda Burrow, I advised her that Dr.McLean stated no changes to lab results and to continue medications as prescribed. Patients daughter then asked doesn't her Cr level place her stage 3 renal failure. I asked her, had her mother recently been diagnosed with renal failure and she said no. I advised her that Dr.McLean does pay close attention to patients Cr levels as well as other lab results and that the results were kinda the same as the previous labs that were taken before then.

## 2019-12-25 ENCOUNTER — Other Ambulatory Visit: Payer: Self-pay | Admitting: Cardiology

## 2019-12-26 NOTE — Telephone Encounter (Signed)
Coumadin appt scheduled for 01/04/2020, Will give 1 month supply so pt does not run out.

## 2019-12-27 NOTE — Progress Notes (Signed)
[facility] PREP Weekly Session   Patient Details  Name: Hannah [name] MRN: [id number] Date of Birth: [birth year] Age: 74 y.o. PCP: Lujean Amel, MD  Vitals:   12/27/19 1604  Weight: 298 lb 6.4 oz (135.4 kg)     [employer] Weekly seesion - 12/27/19 1600      Weekly Session   Topic Discussed Importance of resistance training;Other ways to be active    Minutes exercised this week 44 minutes    Classes attended to date 2          Grateful for: to be able to walk some, thankful for God and my daughter Nutrition celebration: watching sodium intake, ate more salad Barriers/struggles: lifting legs up and knee (left one)  Barnett Hatter 12/27/2019, 4:05 PM

## 2020-01-03 NOTE — Progress Notes (Signed)
[facility] PREP Weekly Session   Patient Details  Name: Hannah [name] MRN: [id number] Date of Birth: 11-20-1945 Age: 74 y.o. PCP: Lujean Amel, MD  Vitals:   01/03/20 1445  Weight: 297 lb 12.8 oz (135.1 kg)     [employer] Weekly seesion - 01/03/20 1400      Weekly Session   Topic Discussed Healthy eating tips   sugar brochure given   Minutes exercised this week 150 minutes    Classes attended to date 4          Fun things since last meeting: going to beauty parlor Grateful for: my wonderful loving daughter Having 38 lbs of fluid water removed Nutrition celebration: watching sodium carefully  Barnett Hatter 01/03/2020, 2:46 PM

## 2020-01-04 ENCOUNTER — Other Ambulatory Visit: Payer: Self-pay

## 2020-01-04 ENCOUNTER — Ambulatory Visit (INDEPENDENT_AMBULATORY_CARE_PROVIDER_SITE_OTHER): Payer: PPO

## 2020-01-04 DIAGNOSIS — I4891 Unspecified atrial fibrillation: Secondary | ICD-10-CM | POA: Diagnosis not present

## 2020-01-04 DIAGNOSIS — Z7901 Long term (current) use of anticoagulants: Secondary | ICD-10-CM | POA: Diagnosis not present

## 2020-01-04 DIAGNOSIS — Z5181 Encounter for therapeutic drug level monitoring: Secondary | ICD-10-CM

## 2020-01-04 LAB — PROTIME-INR
INR: 6 (ref 0.9–1.2)
Prothrombin Time: 63.7 s — ABNORMAL HIGH (ref 9.1–12.0)

## 2020-01-04 LAB — POCT INR: INR: 6.9 — AB (ref 2.0–3.0)

## 2020-01-04 NOTE — Patient Instructions (Signed)
Description   Pt sent for STAT INR, pt has taken today's dosage of Warfarin. Go to ED with bleeding problems.  INR 6.0 at lab, called spoke with Deanna, pt's daughter, advised to have pt hold Warfarin 3 days, then start taking 1 tablet daily except 1/2 tablet on Mondays and Fridays.  Recheck in 1 week.  Pt's daughter states it is difficult for pt to come to office, is aware of risks of delaying follow-up, made follow-up appt in 2 weeks.  Advised to go to ED with bleeding issues.  Contact Coumadin Clinic with any questions or any new meds [phone].

## 2020-01-05 ENCOUNTER — Encounter (HOSPITAL_COMMUNITY): Payer: PPO

## 2020-01-10 NOTE — Progress Notes (Signed)
[facility] PREP Weekly Session   Patient Details  Name: Hannah [name] MRN: [id number] Date of Birth: 06-Nov-1945 Age: 74 y.o. PCP: Lujean Amel, MD  Vitals:   01/10/20 1632  Weight: 296 lb (134.3 kg)     [facility] Weekly seesion - 01/10/20 1600      Weekly Session   Topic Discussed Health habits    Minutes exercised this week 270 minutes    Classes attended to date 6          Fun things since last meeting: I tried the recumbent bike 300 steps 10 min Grateful for: I am glad I worked leg in a short way, faith and daughter Nutrition celebration: watched sugar more and had less sodium   Barnett Hatter 01/10/2020, 4:33 PM

## 2020-01-17 NOTE — Progress Notes (Signed)
[facility] PREP Weekly Session   Patient Details  Name: Hannah [name] MRN: [id number] Date of Birth: Oct 02, 1945 Age: 74 y.o. PCP: Lujean Amel, MD  Vitals:   01/17/20 1643  Weight: 298 lb 1.6 oz (135.2 kg)     [facility] Weekly seesion - 01/17/20 1600      Weekly Session   Topic Discussed Restaurant Eating   portion demo   Minutes exercised this week 60 minutes    Classes attended to date 69          Fun things since lst meeting: enjoyed looking at sky and clouds Grateful for: God, family, and Pam (so helpful) Enjoyed the fluffy clouds Nutrition celebration: lost 1 lb Barriers/struggles: trouble with legs and knees    Barnett Hatter 01/17/2020, 4:44 PM

## 2020-01-18 ENCOUNTER — Ambulatory Visit (INDEPENDENT_AMBULATORY_CARE_PROVIDER_SITE_OTHER): Payer: PPO | Admitting: *Deleted

## 2020-01-18 ENCOUNTER — Other Ambulatory Visit: Payer: Self-pay

## 2020-01-18 DIAGNOSIS — Z5181 Encounter for therapeutic drug level monitoring: Secondary | ICD-10-CM

## 2020-01-18 DIAGNOSIS — Z7901 Long term (current) use of anticoagulants: Secondary | ICD-10-CM

## 2020-01-18 DIAGNOSIS — I4891 Unspecified atrial fibrillation: Secondary | ICD-10-CM

## 2020-01-18 LAB — POCT INR: INR: 2.8 (ref 2.0–3.0)

## 2020-01-18 NOTE — Patient Instructions (Addendum)
Description   Continue taking Warfarin 1 tablet daily except 1/2 tablet on Mondays and Fridays. Recheck INR in 2 weeks. Continue to limit wine intake. Contact Coumadin Clinic with any questions or any new meds [phone].

## 2020-01-23 ENCOUNTER — Telehealth: Payer: Self-pay | Admitting: *Deleted

## 2020-01-23 NOTE — Telephone Encounter (Signed)
02/21/19 Patient (Patient fell and has to have surgery. Will call back to reschedule.)  Appointment Orders   Reached out to patient to see if she wanted to reschedule lmtcb.

## 2020-01-27 ENCOUNTER — Other Ambulatory Visit: Payer: Self-pay | Admitting: Cardiology

## 2020-01-30 ENCOUNTER — Encounter: Payer: Self-pay | Admitting: Cardiology

## 2020-01-30 ENCOUNTER — Other Ambulatory Visit: Payer: Self-pay

## 2020-01-30 ENCOUNTER — Ambulatory Visit (INDEPENDENT_AMBULATORY_CARE_PROVIDER_SITE_OTHER): Payer: PPO

## 2020-01-30 ENCOUNTER — Other Ambulatory Visit: Payer: Self-pay | Admitting: *Deleted

## 2020-01-30 ENCOUNTER — Ambulatory Visit (INDEPENDENT_AMBULATORY_CARE_PROVIDER_SITE_OTHER): Payer: PPO | Admitting: Cardiology

## 2020-01-30 VITALS — BP 112/68 | HR 79 | Ht 66.0 in | Wt 298.0 lb

## 2020-01-30 DIAGNOSIS — I4891 Unspecified atrial fibrillation: Secondary | ICD-10-CM

## 2020-01-30 DIAGNOSIS — Z7901 Long term (current) use of anticoagulants: Secondary | ICD-10-CM

## 2020-01-30 DIAGNOSIS — I4819 Other persistent atrial fibrillation: Secondary | ICD-10-CM

## 2020-01-30 DIAGNOSIS — Z5181 Encounter for therapeutic drug level monitoring: Secondary | ICD-10-CM | POA: Diagnosis not present

## 2020-01-30 LAB — POCT INR: INR: 5.9 — AB (ref 2.0–3.0)

## 2020-01-30 MED ORDER — WARFARIN SODIUM 5 MG PO TABS: ORAL_TABLET | ORAL | 0 refills | Status: DC

## 2020-01-30 NOTE — Patient Instructions (Signed)
Medication Instructions:  Your physician recommends that you continue on your current medications as directed. Please refer to the Current Medication list given to you today.  *If you need a refill on your cardiac medications before your next appointment, please call your pharmacy*   Lab Work: None ordered   Testing/Procedures: None ordered   Follow-Up: At [facility], you and your health needs are our priority.  As part of our continuing mission to provide you with exceptional heart care, we have created designated Provider Care Teams.  These Care Teams include your primary Cardiologist (physician) and Advanced Practice Providers (APPs -  Physician Assistants and Nurse Practitioners) who all work together to provide you with the care you need, when you need it.  We recommend signing up for the patient portal called "MyChart".  Sign up information is provided on this After Visit Summary.  MyChart is used to connect with patients for Virtual Visits (Telemedicine).  Patients are able to view lab/test results, encounter notes, upcoming appointments, etc.  Non-urgent messages can be sent to your provider as well.   To learn more about what you can do with MyChart, go to [email or web address].    Your next appointment:   03/20/2020 @ 2:15 pm with Dr. Curt Bears   Thank you for choosing [facility]!!   Trinidad Curet, RN [phone]    Other Instructions   Cardiac Ablation Cardiac ablation is a procedure to disable (ablate) a small amount of heart tissue in very specific places. The heart has many electrical connections. Sometimes these connections are abnormal and can cause the heart to beat very fast or irregularly. Ablating some of the problem areas can improve the heart rhythm or return it to normal. Ablation may be done for people who:  Have Wolff-Parkinson-White syndrome.  Have fast heart rhythms (tachycardia).  Have taken medicines for an abnormal heart rhythm  (arrhythmia) that were not effective or caused side effects.  Have a high-risk heartbeat that may be life-threatening. During the procedure, a small incision is made in the neck or the groin, and a long, thin, flexible tube (catheter) is inserted into the incision and moved to the heart. Small devices (electrodes) on the tip of the catheter will send out electrical currents. A type of X-ray (fluoroscopy) will be used to help guide the catheter and to provide images of the heart. Tell a health care provider about:  Any allergies you have.  All medicines you are taking, including vitamins, herbs, eye drops, creams, and over-the-counter medicines.  Any problems you or family members have had with anesthetic medicines.  Any blood disorders you have.  Any surgeries you have had.  Any medical conditions you have, such as kidney failure.  Whether you are pregnant or may be pregnant. What are the risks? Generally, this is a safe procedure. However, problems may occur, including:  Infection.  Bruising and bleeding at the catheter insertion site.  Bleeding into the chest, especially into the sac that surrounds the heart. This is a serious complication.  Stroke or blood clots.  Damage to other structures or organs.  Allergic reaction to medicines or dyes.  Need for a permanent pacemaker if the normal electrical system is damaged. A pacemaker is a small computer that sends electrical signals to the heart and helps your heart beat normally.  The procedure not being fully effective. This may not be recognized until months later. Repeat ablation procedures are sometimes required. What happens before the procedure?  Follow instructions from your  health care provider about eating or drinking restrictions.  Ask your health care provider about: ? Changing or stopping your regular medicines. This is especially important if you are taking diabetes medicines or blood thinners. ? Taking medicines  such as aspirin and ibuprofen. These medicines can thin your blood. Do not take these medicines before your procedure if your health care provider instructs you not to.  Plan to have someone take you home from the hospital or clinic.  If you will be going home right after the procedure, plan to have someone with you for 24 hours. What happens during the procedure?  To lower your risk of infection: ? Your health care team will wash or sanitize their hands. ? Your skin will be washed with soap. ? Hair may be removed from the incision area.  An IV tube will be inserted into one of your veins.  You will be given a medicine to help you relax (sedative).  The skin on your neck or groin will be numbed.  An incision will be made in your neck or your groin.  A needle will be inserted through the incision and into a large vein in your neck or groin.  A catheter will be inserted into the needle and moved to your heart.  Dye may be injected through the catheter to help your surgeon see the area of the heart that needs treatment.  Electrical currents will be sent from the catheter to ablate heart tissue in desired areas. There are three types of energy that may be used to ablate heart tissue: ? Heat (radiofrequency energy). ? Laser energy. ? Extreme cold (cryoablation).  When the necessary tissue has been ablated, the catheter will be removed.  Pressure will be held on the catheter insertion area to prevent excessive bleeding.  A bandage (dressing) will be placed over the catheter insertion area. The procedure may vary among health care providers and hospitals. What happens after the procedure?  Your blood pressure, heart rate, breathing rate, and blood oxygen level will be monitored until the medicines you were given have worn off.  Your catheter insertion area will be monitored for bleeding. You will need to lie still for a few hours to ensure that you do not bleed from the catheter  insertion area.  Do not drive for 24 hours or as long as directed by your health care provider. Summary  Cardiac ablation is a procedure to disable (ablate) a small amount of heart tissue in very specific places. Ablating some of the problem areas can improve the heart rhythm or return it to normal.  During the procedure, electrical currents will be sent from the catheter to ablate heart tissue in desired areas. This information is not intended to replace advice given to you by your health care provider. Make sure you discuss any questions you have with your health care provider. Document Revised: 12/21/2017 Document Reviewed: 05/19/2016 Elsevier Patient Education  [place].

## 2020-01-30 NOTE — Progress Notes (Signed)
Electrophysiology Office Note   Date:  01/30/2020   ID:  Hannah [name], DOB Jul 05, 1946, MRN [id number]  PCP:  Lujean Amel, MD Primary Electrophysiologist:  Constance Haw, MD    No chief complaint on file.    History of Present Illness: Hannah [name] is a 74 y.o. female who is being seen today for the evaluation of CHF, AF at the request of Larey Dresser, MD. Presenting today for electrophysiology evaluation.  She has a history of atrial fibrillation, chronic diastolic heart failure, and hyperlipidemia as well as morbid obesity.  She has been on amiodarone, but unfortunately has had multiple cardioversions and has reverted back to atrial fibrillation.  Today, denies symptoms of palpitations, chest pain, orthopnea, PND,  claudication, dizziness, presyncope, syncope, bleeding, or neurologic sequela. The patient is tolerating medications without difficulties.  She continues to have issues of weakness, fatigue, and lower extremity edema.  She was hospitalized for cardioversion, a few months ago, but quickly reverted back into atrial fibrillation.  She says that she felt improved in sinus rhythm.  She has diuresed up to 30 pounds and is now less than 300 pounds.  Past Medical History:  Diagnosis Date  . Atrial fibrillation ()   . Diabetes mellitus without complication ([place])   . Glaucoma    BILATERAL  . Hypertension   . Thyroid disease   . Volume overload 11/2019   Past Surgical History:  Procedure Laterality Date  . APPENDECTOMY    . CARDIOVERSION  11/24/2019  . CARDIOVERSION N/A 11/24/2019   Procedure: CARDIOVERSION;  Surgeon: Larey Dresser, MD;  Location: [facility] ENDOSCOPY;  Service: Cardiovascular;  Laterality: N/A;  . CARDIOVERSION N/A 11/29/2019   Procedure: CARDIOVERSION;  Surgeon: Larey Dresser, MD;  Location: [facility] ENDOSCOPY;  Service: Cardiovascular;  Laterality: N/A;  . CHOLECYSTECTOMY    . HERNIA REPAIR     UMBILICAL  . LASIK     BILATERAL  .  TEE WITHOUT CARDIOVERSION N/A 11/24/2019   Procedure: TRANSESOPHAGEAL ECHOCARDIOGRAM (TEE);  Surgeon: Larey Dresser, MD;  Location: [facility] ENDOSCOPY;  Service: Cardiovascular;  Laterality: N/A;     Current Outpatient Medications  Medication Sig Dispense Refill  . amiodarone (PACERONE) 200 MG tablet Take 1 tablet twice a day for 10 days, then reduce down to 1 tablet once daily 90 tablet 1  . busPIRone (BUSPAR) 15 MG tablet TAKE 1 TABLET THREE TIMES A DAY (Patient taking differently: 2 (two) times daily. ) 270 tablet 1  . DULoxetine (CYMBALTA) 30 MG capsule Take 3 capsules (90 mg total) by mouth daily. 270 capsule 1  . esomeprazole (NEXIUM) 40 MG capsule Take 40 mg by mouth as needed.    Marland Kitchen levocetirizine (XYZAL) 5 MG tablet Take 5 mg by mouth at bedtime.     Marland Kitchen levothyroxine (SYNTHROID, LEVOTHROID) 75 MCG tablet Take 75 mcg by mouth daily before breakfast.     . metolazone (ZAROXOLYN) 2.5 MG tablet Take 1 tablet (2.5 mg total) by mouth once a week. On Fridays 30 tablet 8  . Misc. Devices (BARIATRIC ROLLATOR) MISC For home use 1 each 0  . montelukast (SINGULAIR) 10 MG tablet Take 10 mg by mouth at bedtime.     Marland Kitchen NYAMYC powder Apply 1 application topically 2 (two) times daily as needed (rash under breasts).     . Potassium Chloride ER 20 MEQ TBCR Take 20 mEq by mouth 2 (two) times daily. Take extra 20 meq on days she takes extra diuretic. 200 tablet 1  .  rosuvastatin (CRESTOR) 5 MG tablet Take 5 mg by mouth daily.    Marland Kitchen spironolactone (ALDACTONE) 25 MG tablet TAKE 1/2 TABLET DAILY. 15 tablet 9  . torsemide (DEMADEX) 20 MG tablet Take 3 tablets (60 mg total) by mouth every morning AND 2 tablets (40 mg total) every evening. 150 tablet 1  . traZODone (DESYREL) 100 MG tablet Take 2 tablets (200 mg total) by mouth at bedtime. 180 tablet 1  . triamcinolone cream (KENALOG) 0.1 % daily.    Marland Kitchen warfarin (COUMADIN) 5 MG tablet Take 1/2 a tablet to 1 tablet by mouth daily as directed by the coumadin clinic. 35  tablet 0   No current facility-administered medications for this visit.    Allergies:   Penicillins   Social History:  The patient  reports that she has never smoked. She has never used smokeless tobacco. She reports current alcohol use of about 1.0 standard drink of alcohol per week. She reports that she does not use drugs.   Family History:  The patient's family history includes Colon cancer in her father; Heart attack in her brother and paternal grandfather; Heart disease in her brother; Lung cancer in her maternal grandfather and mother.   ROS:  Please see the history of present illness.   Otherwise, review of systems is positive for none.   All other systems are reviewed and negative.   PHYSICAL EXAM: VS:  BP 112/68   Pulse 79   Ht 5\' 6"  (1.676 m)   Wt 298 lb (135.2 kg)   SpO2 98%   BMI 48.10 kg/m  , BMI Body mass index is 48.1 kg/m. GEN: Well nourished, well developed, in no acute distress  HEENT: normal  Neck: no JVD, carotid bruits, or masses Cardiac: Irregular; no murmurs, rubs, or gallops,no edema  Respiratory:  clear to auscultation bilaterally, normal work of breathing GI: soft, nontender, nondistended, + BS MS: no deformity or atrophy  Skin: warm and dry Neuro:  Strength and sensation are intact Psych: euthymic mood, full affect  EKG:  EKG is ordered today. Personal review of the ekg ordered shows atrial fibrillation, rate 79   Recent Labs: 11/26/2019: Magnesium 2.2 12/09/2019: ALT 22; Hemoglobin 10.5; Platelets 286; TSH 6.856 12/19/2019: BUN 55; Creatinine, Ser 2.39; Potassium 3.7; Sodium 136    Lipid Panel  No results found for: CHOL, TRIG, HDL, CHOLHDL, VLDL, LDLCALC, LDLDIRECT   Wt Readings from Last 3 Encounters:  01/30/20 298 lb (135.2 kg)  01/17/20 298 lb 1.6 oz (135.2 kg)  01/10/20 296 lb (134.3 kg)      Other studies Reviewed: Additional studies/ records that were reviewed today include: TTE 01/18/18 Review of the above records today  demonstrates:  - Left ventricle: The cavity size was mildly dilated. Systolic   function was normal. The estimated ejection fraction was in the   range of 60% to 65%. Wall motion was normal; there were no   regional wall motion abnormalities. Left ventricular diastolic   function parameters were normal. - Left atrium: The atrium was severely dilated. - Atrial septum: There was increased thickness of the septum,   consistent with lipomatous hypertrophy.   ASSESSMENT AND PLAN:  1.  Persistent atrial fibrillation: Currently on amiodarone and warfarin with a CHA2DS2-VASc of 4.  She is unfortunately failed cardioversion x2.  She presents today in atrial fibrillation wondering if she would be a candidate for ablation.  She has a sleep study scheduled for likely obstructive sleep apnea and has been [facility]  [employer] to [facility] and [facility].  She is somewhat limited by her meniscus injury in her knee.  Despite that, she is certainly trying to lose weight and be healthier.  As she is trying to improve her overall symptomatology, ablation may be beneficial.  We Hannah [name] see her back after her sleep study.  2. Chronic diastolic heart failure: Likely untreated sleep apnea and RV failure.  NYHA class III symptoms.  Diuresis complicated by cardiorenal syndrome.  Currently on torsemide and metolazone per heart failure cardiology.    3.  Likely obstructive sleep apnea: Heart failure cardiology working to arrange home sleep study  4.  Morbid obesity: Has been referred to the [facility] prep class.  Diet and exercise encouraged.  Case discussed with primary cardiology  Current medicines are reviewed at length with the patient today.   The patient does not have concerns regarding her medicines.  The following changes were made today: None  Labs/ tests ordered today include:  Orders Placed This Encounter  Procedures  . EKG 12-Lead     Disposition:   FU with Braxon Suder 3 months  Signed, Meliss Fleek Meredith Leeds, MD   01/30/2020 2:41 PM     [place] [address] [place] [address] [phone] (office) [phone] (fax)

## 2020-01-30 NOTE — Patient Instructions (Addendum)
    Description   Skip 2 dosages of Warfarin, then take 1/2 tablet on Thursday, then start taking 1 tablet daily except 1/2 tablet on Mondays, Wednesdays and Fridays. Recheck INR 1-2 week, pt refused to return sooner than 4 weeks, aware of risks. Continue to limit wine intake. Contact Coumadin Clinic with any questions or any new meds [phone].

## 2020-01-31 NOTE — Progress Notes (Signed)
[facility] PREP Weekly Session   Patient Details  Name: Hannah [name] MRN: [id number] Date of Birth: March 28, 1946 Age: 74 y.o. PCP: Lujean Amel, MD  Vitals:   01/31/20 1545  Weight: 298 lb (135.2 kg)     [facility] Weekly seesion - 01/31/20 1500      Weekly Session   Topic Discussed Expectations and non-scale victories    Minutes exercised this week 60 minutes    Classes attended to date 32           Grateful for: my doctor may do ablation for me. I am able to lift my feet into the car.  Nutrition celebration: watching salt, extra sugar, eating fruit instead of cookies or cake . Barriers/struggles: walking    Barnett Hatter 01/31/2020, 3:46 PM

## 2020-02-14 ENCOUNTER — Telehealth: Payer: Self-pay

## 2020-02-15 ENCOUNTER — Telehealth: Payer: Self-pay

## 2020-02-15 NOTE — Telephone Encounter (Signed)
LVMT pt requesting call back checking in after missed PREP class.

## 2020-02-15 NOTE — Telephone Encounter (Signed)
Pt returned call. Had recent fall with injury then experienced sore throat and ear pain  Frustrated that she hasn't been able to get to gym. Will miss tomorrow as she was able to get a sleep study appt needed for her ablation eval. Plans on returning to PREP next week.

## 2020-02-17 DIAGNOSIS — E119 Type 2 diabetes mellitus without complications: Secondary | ICD-10-CM | POA: Diagnosis not present

## 2020-02-17 DIAGNOSIS — Z1211 Encounter for screening for malignant neoplasm of colon: Secondary | ICD-10-CM | POA: Diagnosis not present

## 2020-02-17 DIAGNOSIS — I4891 Unspecified atrial fibrillation: Secondary | ICD-10-CM | POA: Diagnosis not present

## 2020-02-17 DIAGNOSIS — Z0001 Encounter for general adult medical examination with abnormal findings: Secondary | ICD-10-CM | POA: Diagnosis not present

## 2020-02-17 DIAGNOSIS — D649 Anemia, unspecified: Secondary | ICD-10-CM | POA: Diagnosis not present

## 2020-02-17 DIAGNOSIS — I5032 Chronic diastolic (congestive) heart failure: Secondary | ICD-10-CM | POA: Diagnosis not present

## 2020-02-17 DIAGNOSIS — M1710 Unilateral primary osteoarthritis, unspecified knee: Secondary | ICD-10-CM | POA: Diagnosis not present

## 2020-02-17 DIAGNOSIS — F321 Major depressive disorder, single episode, moderate: Secondary | ICD-10-CM | POA: Diagnosis not present

## 2020-02-17 DIAGNOSIS — N1832 Chronic kidney disease, stage 3b: Secondary | ICD-10-CM | POA: Diagnosis not present

## 2020-02-17 DIAGNOSIS — E039 Hypothyroidism, unspecified: Secondary | ICD-10-CM | POA: Diagnosis not present

## 2020-02-17 DIAGNOSIS — E7800 Pure hypercholesterolemia, unspecified: Secondary | ICD-10-CM | POA: Diagnosis not present

## 2020-02-27 ENCOUNTER — Ambulatory Visit (HOSPITAL_COMMUNITY)
Admission: RE | Admit: 2020-02-27 | Discharge: 2020-02-27 | Disposition: A | Discharge status: Home / Self Care | Payer: PPO | Source: Ambulatory Visit | Attending: Internal Medicine | Admitting: Internal Medicine

## 2020-02-27 ENCOUNTER — Encounter (HOSPITAL_COMMUNITY): Payer: Self-pay

## 2020-02-27 ENCOUNTER — Other Ambulatory Visit: Payer: Self-pay

## 2020-02-27 VITALS — BP 124/76 | HR 77 | Wt 264.2 lb

## 2020-02-27 DIAGNOSIS — E785 Hyperlipidemia, unspecified: Secondary | ICD-10-CM | POA: Diagnosis not present

## 2020-02-27 DIAGNOSIS — N183 Chronic kidney disease, stage 3 unspecified: Secondary | ICD-10-CM | POA: Insufficient documentation

## 2020-02-27 DIAGNOSIS — E669 Obesity, unspecified: Secondary | ICD-10-CM | POA: Diagnosis not present

## 2020-02-27 DIAGNOSIS — I5032 Chronic diastolic (congestive) heart failure: Secondary | ICD-10-CM | POA: Insufficient documentation

## 2020-02-27 DIAGNOSIS — E1122 Type 2 diabetes mellitus with diabetic chronic kidney disease: Secondary | ICD-10-CM | POA: Diagnosis not present

## 2020-02-27 DIAGNOSIS — I130 Hypertensive heart and chronic kidney disease with heart failure and stage 1 through stage 4 chronic kidney disease, or unspecified chronic kidney disease: Secondary | ICD-10-CM | POA: Insufficient documentation

## 2020-02-27 DIAGNOSIS — I4510 Unspecified right bundle-branch block: Secondary | ICD-10-CM | POA: Insufficient documentation

## 2020-02-27 DIAGNOSIS — E039 Hypothyroidism, unspecified: Secondary | ICD-10-CM | POA: Insufficient documentation

## 2020-02-27 DIAGNOSIS — Z79899 Other long term (current) drug therapy: Secondary | ICD-10-CM | POA: Diagnosis not present

## 2020-02-27 DIAGNOSIS — R0609 Other forms of dyspnea: Secondary | ICD-10-CM | POA: Insufficient documentation

## 2020-02-27 DIAGNOSIS — Z6841 Body Mass Index (BMI) 40.0 and over, adult: Secondary | ICD-10-CM | POA: Diagnosis not present

## 2020-02-27 DIAGNOSIS — I4819 Other persistent atrial fibrillation: Secondary | ICD-10-CM | POA: Diagnosis not present

## 2020-02-27 DIAGNOSIS — Z7901 Long term (current) use of anticoagulants: Secondary | ICD-10-CM | POA: Insufficient documentation

## 2020-02-27 LAB — COMPREHENSIVE METABOLIC PANEL
ALT: 17 U/L (ref 0–44)
AST: 16 U/L (ref 15–41)
Albumin: 3.7 g/dL (ref 3.5–5.0)
Alkaline Phosphatase: 51 U/L (ref 38–126)
Anion gap: 14 (ref 5–15)
BUN: 52 mg/dL — ABNORMAL HIGH (ref 8–23)
CO2: 31 mmol/L (ref 22–32)
Calcium: 9.5 mg/dL (ref 8.9–10.3)
Chloride: 93 mmol/L — ABNORMAL LOW (ref 98–111)
Creatinine, Ser: 2.09 mg/dL — ABNORMAL HIGH (ref 0.44–1.00)
GFR calc Af Amer: 27 mL/min — ABNORMAL LOW (ref >60)
GFR calc non Af Amer: 23 mL/min — ABNORMAL LOW (ref >60)
Glucose, Bld: 113 mg/dL — ABNORMAL HIGH (ref 70–99)
Potassium: 3.5 mmol/L (ref 3.5–5.1)
Sodium: 138 mmol/L (ref 135–145)
Total Bilirubin: 1.1 mg/dL (ref 0.3–1.2)
Total Protein: 8.2 g/dL — ABNORMAL HIGH (ref 6.5–8.1)

## 2020-02-27 LAB — TSH: TSH: 6.923 u[IU]/mL — ABNORMAL HIGH (ref 0.350–4.500)

## 2020-02-27 LAB — CBC
HCT: 34.7 % — ABNORMAL LOW (ref 36.0–46.0)
Hemoglobin: 10.5 g/dL — ABNORMAL LOW (ref 12.0–15.0)
MCH: 26.1 pg (ref 26.0–34.0)
MCHC: 30.3 g/dL (ref 30.0–36.0)
MCV: 86.1 fL (ref 80.0–100.0)
Platelets: 222 10*3/uL (ref 150–400)
RBC: 4.03 MIL/uL (ref 3.87–5.11)
RDW: 18.5 % — ABNORMAL HIGH (ref 11.5–15.5)
WBC: 5.2 10*3/uL (ref 4.0–10.5)
nRBC: 0 % (ref 0.0–0.2)

## 2020-02-27 LAB — T4, FREE: Free T4: 1.03 ng/dL (ref 0.61–1.12)

## 2020-02-27 NOTE — Patient Instructions (Signed)
Routine lab work today. Will notify you of abnormal results  Follow up in November with Dr.McLean.

## 2020-02-27 NOTE — Progress Notes (Signed)
Advanced Heart Failure Clinic Progress Note   PCP: Lujean Amel, MD  EP: Dr. Curt Bears HF: Cardiology: Dr. Aundra Dubin   Reason for Visit: Chronic Diastolic Heart Failure and Persistent Atrial Fibrillation   74 y.o. with history of atrial fibrillation and chronic diastolic CHF was initially referred to [facility] by Dr. Curt Bears for evaluation of CHF.  She moved to [facility] from [place] in 2018.  She was first diagnosed with atrial fibrillation in [place], had cardioversion.  She has been maintained on amiodarone for several years and has been on torsemide for several years for chronic diastolic HF.   Last clinic visit w/ Dr. Aundra Dubin in May, she was persistent atrial fibrillation despite being on amiodarone.  Also w/ volume overload. She had developed worsening exertional dyspnea and weeping LEE.  she has developed gradually worsening peripheral edema. Her home diuretics were adjusted. Torsemide was increased and metolazone added. She was set up for TEE guided DCCV on 5/13. TEE negative for cardiac thrombus. EF 60-65%. She converted to NSR w/ cardioversion but given continued marked edema, she was directly admitted for IV diuretics. She responded well to IV diuretics but reverted back to Atrial fibrillation and required repeat DCCV. Was loaded on IV amio before being transitioned back to PO. D/c wt was 305 lb.    She had post hospital f/u w/ Dr. Aundra Dubin on 5/28 and was back in afib but rate controlled. She was referred back to Dr. Curt Bears for consideration for afib ablation. He saw her 7/21 and had ordered for her to get a sleep study to r/o OSA before pursuing ablation. She has return f/u w/ Dr. Curt Bears on 9/7.   Also of note, at her visit in 5/21, BMP showed a bump in SCr to 2.3. she was instructed to hold torsemide x 2 days then return to reduced dose  60 qam/40 qpm. She had f/u BMP 1 week later, 6/7, and SCr was still elevated but stable at 2.3  She now presents to [facility] for f/u for diastolic heart failure. She has lost a  significant amount of weight since her post hospital f/u in late May. Her discharge wt was 305 lb. Her post hospital clinic wt was 302 lb. Her wt today is 264 lb, but intentional. She has been dieting. Trying to eat better. Has cut out sweets.   She continues w/ chronic exertional dyspnea but no resting symptoms. Denies orthopnea/ PND. EKG shows persistent atrial fibrillation, well rate controlled in the 70s. BP 124/76. Denies abnormal bleeding.    Labs (4/21): TSH elevated 10.9, K 4.3, creatinine 1.17, LFTs normal.  Labs (5/21): K 3.6, creatinine 2.05  Labs (5/21): TSH elevated 6.9, K 4.1, creatinine 2.3, hgb 10.5  Labs (6/21): K 3.7, creatinine 2.39   ECG (personally reviewed): persistent atrial fibrillation, 77 bpm RBBB  PMH: 1. Type 2 diabetes 2. HTN 3. Atrial fibrillation: Paroxysmal. 1st noted in 2010.  Had DCCV in the past.  4. Chronic diastolic CHF: Echo (3/66) with EF 60-65%, severe LAE.  5. Hypothyroidism.   Social History   Socioeconomic History  . Marital status: Legally Separated    Spouse name: Not on file  . Number of children: Not on file  . Years of education: Not on file  . Highest education level: Not on file  Occupational History  . Not on file  Tobacco Use  . Smoking status: Never Smoker  . Smokeless tobacco: Never Used  Vaping Use  . Vaping Use: Never used  Substance and Sexual Activity  .  Alcohol use: Yes    Alcohol/week: 1.0 standard drink    Types: 1 Glasses of wine per week    Comment: occ  . Drug use: No  . Sexual activity: Not Currently  Other Topics Concern  . Not on file  Social History Narrative  . Not on file   Social Determinants of Health   Financial Resource Strain:   . Difficulty of Paying Living Expenses:   Food Insecurity:   . Worried About Charity fundraiser in the Last Year:   . Arboriculturist in the Last Year:   Transportation Needs:   . Film/video editor (Medical):   Marland Kitchen Lack of Transportation (Non-Medical):    Physical Activity:   . Days of Exercise per Week:   . Minutes of Exercise per Session:   Stress:   . Feeling of Stress :   Social Connections:   . Frequency of Communication with Friends and Family:   . Frequency of Social Gatherings with Friends and Family:   . Attends Religious Services:   . Active Member of Clubs or Organizations:   . Attends Archivist Meetings:   Marland Kitchen Marital Status:   Intimate Partner Violence:   . Fear of Current or Ex-Partner:   . Emotionally Abused:   Marland Kitchen Physically Abused:   . Sexually Abused:    Family History  Problem Relation Age of Onset  . Lung cancer Mother   . Colon cancer Father   . Heart attack Brother   . Heart disease Brother   . Lung cancer Maternal Grandfather   . Heart attack Paternal Grandfather    ROS: All systems reviewed and negative except as per HPI.   Current Outpatient Medications  Medication Sig Dispense Refill  . amiodarone (PACERONE) 200 MG tablet Take 1 tablet twice a day for 10 days, then reduce down to 1 tablet once daily 90 tablet 1  . busPIRone (BUSPAR) 15 MG tablet TAKE 1 TABLET THREE TIMES A DAY 270 tablet 1  . DULoxetine (CYMBALTA) 30 MG capsule Take 3 capsules (90 mg total) by mouth daily. 270 capsule 1  . esomeprazole (NEXIUM) 40 MG capsule Take 40 mg by mouth as needed.    Marland Kitchen levocetirizine (XYZAL) 5 MG tablet Take 5 mg by mouth at bedtime.     Marland Kitchen levothyroxine (SYNTHROID, LEVOTHROID) 75 MCG tablet Take 75 mcg by mouth daily before breakfast.     . metolazone (ZAROXOLYN) 2.5 MG tablet Take 1 tablet (2.5 mg total) by mouth once a week. On Fridays 30 tablet 8  . Misc. Devices (BARIATRIC ROLLATOR) MISC For home use 1 each 0  . montelukast (SINGULAIR) 10 MG tablet Take 10 mg by mouth at bedtime.     Marland Kitchen NYAMYC powder Apply 1 application topically 2 (two) times daily as needed (rash under breasts).     . Potassium Chloride ER 20 MEQ TBCR Take 20 mEq by mouth 2 (two) times daily. Take extra 20 meq on days she takes  extra diuretic. 200 tablet 1  . rosuvastatin (CRESTOR) 5 MG tablet Take 5 mg by mouth daily.    Marland Kitchen spironolactone (ALDACTONE) 25 MG tablet TAKE 1/2 TABLET DAILY. 15 tablet 9  . torsemide (DEMADEX) 20 MG tablet Take 3 tablets (60 mg total) by mouth every morning AND 2 tablets (40 mg total) every evening. 150 tablet 1  . traZODone (DESYREL) 100 MG tablet Take 2 tablets (200 mg total) by mouth at bedtime. 180 tablet 1  .  triamcinolone cream (KENALOG) 0.1 % daily.    Marland Kitchen warfarin (COUMADIN) 5 MG tablet Take 1/2 a tablet to 1 tablet by mouth daily as directed by the coumadin clinic. 35 tablet 0   No current facility-administered medications for this encounter.   BP 124/76   Pulse 77   Wt 119.9 kg (264 lb 4 oz)   SpO2 98%   BMI 42.65 kg/m    PHYSICAL EXAM: General:  Well appearing, moderately obese female, in wheelchair. No respiratory difficulty HEENT: normal Neck: supple. no JVD. Carotids 2+ bilat; no bruits. No lymphadenopathy or thyromegaly appreciated. Cor: PMI nondisplaced. Irregularly irregular rhythm, regular rate No rubs, gallops or murmurs. Lungs: clear Abdomen: soft, nontender, nondistended. No hepatosplenomegaly. No bruits or masses. Good bowel sounds. Extremities: no cyanosis, clubbing, rash, trace bilateral LEE w/ chronic venous stasis dermatitis  Neuro: alert & oriented x 3, cranial nerves grossly intact. moves all 4 extremities w/o difficulty. Affect pleasant.   Assessment/plan: 1. Chronic diastolic CHF: Echo in 0/93 with EF 60-65%, severe LAE. TEE at time of DCCV 5/21 showed EF 60-65%.  - Chronically NYHA Class III, confounded by persistent afib and obesity/deconditoning. She is well below previous hospital d/c wt (some in part to recent dietary changes/intended wt loss)  - continue current diuretic regimen, torsemide to 60 qam/40 qpm + once weekly metolazone, 2.5 mg - check BMP today  2. Atrial fibrillation: Persistent. HR controlled in the 70s. Failed DCCV x 2, failed  Amiodarone.  - Dr. Curt Bears following and considering Afib ablation after sleep study has been completed. Will need treatment of OSA prior to ablation if positive sleep study.  - for now, we are continuing amiodarone for rate control, 200 mg once daily. Check HFTs and TFTs today. She will need a regular eye exam while on amiodarone.   - Continue warfarin. INR followed by [facility] 3. OSA: Strongly suspect sleep apnea.  -home sleep study has been arranged. She is scheduled to pick up kit tomorrow.   4. Hyperlipidemia: She is on Crestor.  5. Stage III CKD, check BMP today   Lyda Jester, PA-C  02/27/2020

## 2020-02-28 LAB — T3, FREE: T3, Free: 1.9 pg/mL — ABNORMAL LOW (ref 2.0–4.4)

## 2020-02-29 DIAGNOSIS — G4734 Idiopathic sleep related nonobstructive alveolar hypoventilation: Secondary | ICD-10-CM | POA: Diagnosis not present

## 2020-02-29 DIAGNOSIS — E039 Hypothyroidism, unspecified: Secondary | ICD-10-CM | POA: Diagnosis not present

## 2020-02-29 DIAGNOSIS — I5032 Chronic diastolic (congestive) heart failure: Secondary | ICD-10-CM | POA: Diagnosis not present

## 2020-02-29 DIAGNOSIS — I482 Chronic atrial fibrillation, unspecified: Secondary | ICD-10-CM | POA: Diagnosis not present

## 2020-02-29 DIAGNOSIS — E119 Type 2 diabetes mellitus without complications: Secondary | ICD-10-CM | POA: Diagnosis not present

## 2020-02-29 DIAGNOSIS — G4733 Obstructive sleep apnea (adult) (pediatric): Secondary | ICD-10-CM | POA: Diagnosis not present

## 2020-03-02 ENCOUNTER — Ambulatory Visit (INDEPENDENT_AMBULATORY_CARE_PROVIDER_SITE_OTHER): Payer: PPO | Admitting: Pharmacist

## 2020-03-02 ENCOUNTER — Other Ambulatory Visit: Payer: Self-pay

## 2020-03-02 DIAGNOSIS — Z5181 Encounter for therapeutic drug level monitoring: Secondary | ICD-10-CM

## 2020-03-02 DIAGNOSIS — Z7901 Long term (current) use of anticoagulants: Secondary | ICD-10-CM | POA: Diagnosis not present

## 2020-03-02 LAB — POCT INR: INR: 2.9 (ref 2.0–3.0)

## 2020-03-02 NOTE — Patient Instructions (Signed)
Description   Continue taking 1 tablet daily except 1/2 tablet on Mondays, Wednesdays and Fridays. Recheck INR in 4 weeks.Continue to limit wine intake. Contact Coumadin Clinic with any questions or any new meds [phone].

## 2020-03-09 ENCOUNTER — Other Ambulatory Visit: Payer: Self-pay | Admitting: Pharmacist

## 2020-03-09 MED ORDER — WARFARIN SODIUM 5 MG PO TABS: ORAL_TABLET | ORAL | 0 refills | Status: DC

## 2020-03-15 ENCOUNTER — Other Ambulatory Visit: Payer: Self-pay | Admitting: Cardiology

## 2020-03-20 ENCOUNTER — Encounter: Payer: Self-pay | Admitting: Cardiology

## 2020-03-20 ENCOUNTER — Ambulatory Visit (INDEPENDENT_AMBULATORY_CARE_PROVIDER_SITE_OTHER): Payer: PPO | Admitting: Pharmacist

## 2020-03-20 ENCOUNTER — Other Ambulatory Visit: Payer: Self-pay

## 2020-03-20 ENCOUNTER — Ambulatory Visit (INDEPENDENT_AMBULATORY_CARE_PROVIDER_SITE_OTHER): Payer: PPO | Admitting: Cardiology

## 2020-03-20 VITALS — BP 114/60 | HR 70 | Ht 66.0 in | Wt 268.0 lb

## 2020-03-20 DIAGNOSIS — Z5181 Encounter for therapeutic drug level monitoring: Secondary | ICD-10-CM

## 2020-03-20 DIAGNOSIS — Z01812 Encounter for preprocedural laboratory examination: Secondary | ICD-10-CM | POA: Diagnosis not present

## 2020-03-20 DIAGNOSIS — Z7901 Long term (current) use of anticoagulants: Secondary | ICD-10-CM

## 2020-03-20 DIAGNOSIS — I4819 Other persistent atrial fibrillation: Secondary | ICD-10-CM | POA: Diagnosis not present

## 2020-03-20 DIAGNOSIS — I4891 Unspecified atrial fibrillation: Secondary | ICD-10-CM

## 2020-03-20 LAB — POCT INR: INR: 2.9 (ref 2.0–3.0)

## 2020-03-20 MED ORDER — APIXABAN 5 MG PO TABS: 5.0000 mg | ORAL_TABLET | Freq: Two times a day (BID) | ORAL | 0 refills | Status: DC

## 2020-03-20 MED ORDER — APIXABAN 5 MG PO TABS: 5.0000 mg | ORAL_TABLET | Freq: Two times a day (BID) | ORAL | 5 refills | Status: DC

## 2020-03-20 NOTE — Progress Notes (Signed)
iquis

## 2020-03-20 NOTE — Progress Notes (Signed)
Electrophysiology Office Note   Date:  03/20/2020   ID:  Hannah [name], DOB [birth year], MRN [id number]  PCP:  Lujean Amel, MD Primary Electrophysiologist:  Constance Haw, MD    No chief complaint on file.    History of Present Illness: Hannah [name] is a 74 y.o. female who is being seen today for the evaluation of CHF, AF at the request of Koirala, Dibas, MD. Presenting today for electrophysiology evaluation.  She has a history of atrial fibrillation, chronic diastolic heart failure, and hyperlipidemia as well as morbid obesity.  She has been on amiodarone, but unfortunately has had multiple cardioversions and has reverted back to atrial fibrillation.  Today, denies symptoms of palpitations, chest pain, orthopnea, PND, lower extremity edema, claudication, dizziness, presyncope, syncope, bleeding, or neurologic sequela. The patient is tolerating medications without difficulties.  She continues to have shortness of breath when she exerts herself.  She has no associated chest pain.  She has been working out at [employer] and has been losing weight consistently.  She did have a sleep study which showed mild sleep apnea and oxygen desaturations at night.  She is getting it fit for a CPAP and a repeat study to see if she continues to desaturate.  Aside from that, she is doing well without complaint.  Past Medical History:  Diagnosis Date  . Atrial fibrillation ([place])   . Diabetes mellitus without complication ([place])   . Glaucoma    BILATERAL  . Hypertension   . Thyroid disease   . Volume overload 11/2019   Past Surgical History:  Procedure Laterality Date  . APPENDECTOMY    . CARDIOVERSION  11/24/2019  . CARDIOVERSION N/A 11/24/2019   Procedure: CARDIOVERSION;  Surgeon: Larey Dresser, MD;  Location: [facility] ENDOSCOPY;  Service: Cardiovascular;  Laterality: N/A;  . CARDIOVERSION N/A 11/29/2019   Procedure: CARDIOVERSION;  Surgeon: Larey Dresser, MD;  Location: [facility]  ENDOSCOPY;  Service: Cardiovascular;  Laterality: N/A;  . CHOLECYSTECTOMY    . HERNIA REPAIR     UMBILICAL  . LASIK     BILATERAL  . TEE WITHOUT CARDIOVERSION N/A 11/24/2019   Procedure: TRANSESOPHAGEAL ECHOCARDIOGRAM (TEE);  Surgeon: Larey Dresser, MD;  Location: [facility] ENDOSCOPY;  Service: Cardiovascular;  Laterality: N/A;     Current Outpatient Medications  Medication Sig Dispense Refill  . amiodarone (PACERONE) 200 MG tablet Take 1 tablet twice a day for 10 days, then reduce down to 1 tablet once daily 90 tablet 1  . busPIRone (BUSPAR) 15 MG tablet TAKE 1 TABLET THREE TIMES A DAY 270 tablet 1  . DULoxetine (CYMBALTA) 30 MG capsule Take 3 capsules (90 mg total) by mouth daily. 270 capsule 1  . esomeprazole (NEXIUM) 40 MG capsule Take 40 mg by mouth as needed.    Marland Kitchen levocetirizine (XYZAL) 5 MG tablet Take 5 mg by mouth at bedtime.     Marland Kitchen levothyroxine (SYNTHROID, LEVOTHROID) 75 MCG tablet Take 75 mcg by mouth daily before breakfast.     . metFORMIN (GLUCOPHAGE) 500 MG tablet Take 500 mg by mouth daily.    . metolazone (ZAROXOLYN) 2.5 MG tablet Take 1 tablet (2.5 mg total) by mouth once a week. On Fridays 30 tablet 8  . Misc. Devices (BARIATRIC ROLLATOR) MISC For home use 1 each 0  . montelukast (SINGULAIR) 10 MG tablet Take 10 mg by mouth at bedtime.     Marland Kitchen NYAMYC powder Apply 1 application topically 2 (two) times daily as needed (rash under breasts).     Marland Kitchen  Potassium Chloride ER 20 MEQ TBCR Take 20 mEq by mouth 2 (two) times daily. Take extra 20 meq on days she takes extra diuretic. 200 tablet 1  . rosuvastatin (CRESTOR) 5 MG tablet Take 5 mg by mouth daily.    Marland Kitchen spironolactone (ALDACTONE) 25 MG tablet TAKE 1/2 TABLET DAILY. 15 tablet 9  . torsemide (DEMADEX) 20 MG tablet Take 3 tablets (60 mg total) by mouth every morning AND 2 tablets (40 mg total) every evening. 150 tablet 1  . traZODone (DESYREL) 100 MG tablet Take 2 tablets (200 mg total) by mouth at bedtime. 180 tablet 1  .  triamcinolone cream (KENALOG) 0.1 % daily.    Marland Kitchen warfarin (COUMADIN) 5 MG tablet Take 1/2 a tablet to 1 tablet by mouth daily as directed by the coumadin clinic. 30 tablet 0   No current facility-administered medications for this visit.    Allergies:   Penicillins   Social History:  The patient  reports that she has never smoked. She has never used smokeless tobacco. She reports current alcohol use of about 1.0 standard drink of alcohol per week. She reports that she does not use drugs.   Family History:  The patient's family history includes Colon cancer in her father; Heart attack in her brother and paternal grandfather; Heart disease in her brother; Lung cancer in her maternal grandfather and mother.   ROS:  Please see the history of present illness.   Otherwise, review of systems is positive for none.   All other systems are reviewed and negative.   PHYSICAL EXAM: VS:  BP 114/60   Pulse 70   Ht 5\' 6"  (1.676 m)   Wt 268 lb (121.6 kg)   SpO2 97%   BMI 43.26 kg/m  , BMI Body mass index is 43.26 kg/m. GEN: Well nourished, well developed, in no acute distress  HEENT: normal  Neck: no JVD, carotid bruits, or masses Cardiac: irregular; no murmurs, rubs, or gallops,no edema  Respiratory:  clear to auscultation bilaterally, normal work of breathing GI: soft, nontender, nondistended, + BS MS: no deformity or atrophy  Skin: warm and dry Neuro:  Strength and sensation are intact Psych: euthymic mood, full affect  EKG:  EKG is not ordered today. Personal review of the ekg ordered 02/27/20 shows AF, RBBB    Recent Labs: 11/26/2019: Magnesium 2.2 02/27/2020: ALT 17; BUN 52; Creatinine, Ser 2.09; Hemoglobin 10.5; Platelets 222; Potassium 3.5; Sodium 138; TSH 6.923    Lipid Panel  No results found for: CHOL, TRIG, HDL, CHOLHDL, VLDL, LDLCALC, LDLDIRECT   Wt Readings from Last 3 Encounters:  03/20/20 268 lb (121.6 kg)  02/27/20 264 lb 4 oz (119.9 kg)  01/31/20 298 lb (135.2 kg)       Other studies Reviewed: Additional studies/ records that were reviewed today include: TTE 01/18/18 Review of the above records today demonstrates:  - Left ventricle: The cavity size was mildly dilated. Systolic   function was normal. The estimated ejection fraction was in the   range of 60% to 65%. Wall motion was normal; there were no   regional wall motion abnormalities. Left ventricular diastolic   function parameters were normal. - Left atrium: The atrium was severely dilated. - Atrial septum: There was increased thickness of the septum,   consistent with lipomatous hypertrophy.   ASSESSMENT AND PLAN:  1.  Persistent atrial fibrillation: Currently on amiodarone and warfarin with a CHA2DS2-VASc of 4.  Has unfortunately failed cardioversion x2.  She continues to be  in atrial fibrillation and then is symptomatic with shortness of breath and fatigue.  At this point, she has exhausted all of her medication options.  She has gotten a sleep study which is confirmed sleep apnea as well as enrolled in the [facility] to lose weight.  She would thus benefit from ablation.  Risks and benefits were discussed include bleeding, tamponade, heart block, stroke, damage to chest organs.  She understands these risks and is agreed to the procedure.  We Kimimila Tauzin plan to stop her Coumadin and start her on Eliquis around the time of her ablation.  2.  Chronic diastolic heart failure: Likely untreated sleep apnea and RV failure.  NYHA class III symptoms.  Diuresis complicated by cardiorenal syndrome.  Plan per primary cardiology.  3.  Obstructive sleep apnea: Patient was diagnosed on most recent sleep study.  Is currently getting fitted for CPAP.  4.  Morbid obesity: Patient has been referred to the [facility] prep class.  Diet and exercise encouraged.    Case discussed with primary cardiology  Current medicines are reviewed at length with the patient today.   The patient does not have concerns regarding her medicines.   The following changes were made today: Stop Coumadin, start Eliquis  Labs/ tests ordered today include:  Orders Placed This Encounter  Procedures  . CT CARDIAC MORPH/PULM VEIN W/CM&W/O CA SCORE  . Basic metabolic panel  . CBC     Disposition:   FU with Mertis Mosher 3 months  Signed, Anyelin Mogle Meredith Leeds, MD  03/20/2020 3:08 PM     [place] [facility] [place] [id number] [phone] (office) [phone] (fax)

## 2020-03-20 NOTE — Patient Instructions (Signed)
Do not take any more warfarin. Start taking Eliquis 5mg  twice a day on Thursday night.

## 2020-03-20 NOTE — Progress Notes (Signed)
Sounds good, thanks.

## 2020-03-20 NOTE — Patient Instructions (Signed)
Medication Instructions:  Your physician recommends that you continue on your current medications as directed. Please refer to the Current Medication list given to you today.  *If you need a refill on your cardiac medications before your next appointment, please call your pharmacy*   Lab Work: Pre procedure labs between 9/23 - 10/12:  BMP & CBC If you have labs (blood work) drawn today and your tests are completely normal, you will receive your results only by:  [place] (if you have MyChart) OR  A paper copy in the mail If you have any lab test that is abnormal or we need to change your treatment, we will call you to review the results.   Testing/Procedures: Your physician has requested that you have cardiac CT within 7 days PRIOR to your ablation. Cardiac computed tomography (CT) is a painless test that uses an x-ray machine to take clear, detailed pictures of your heart.  Please follow instruction below located under "other instructions". You will get a call from our office to schedule the date for this test.  Your physician has recommended that you have an ablation. Catheter ablation is a medical procedure used to treat some cardiac arrhythmias (irregular heartbeats). During catheter ablation, a long, thin, flexible tube is put into a blood vessel in your groin (upper thigh), or neck. This tube is called an ablation catheter. It is then guided to your heart through the blood vessel. Radio frequency waves destroy small areas of heart tissue where abnormal heartbeats may cause an arrhythmia to start. Please follow instruction below located under "other instructions".   Follow-Up: At [facility], you and your health needs are our priority.  As part of our continuing mission to provide you with exceptional heart care, we have created designated Provider Care Teams.  These Care Teams include your primary Cardiologist (physician) and Advanced Practice Providers (APPs -  Physician  Assistants and Nurse Practitioners) who all work together to provide you with the care you need, when you need it.  We recommend signing up for the patient portal called "MyChart".  Sign up information is provided on this After Visit Summary.  MyChart is used to connect with patients for Virtual Visits (Telemedicine).  Patients are able to view lab/test results, encounter notes, upcoming appointments, etc.  Non-urgent messages can be sent to your provider as well.   To learn more about what you can do with MyChart, go to [email or web address].    Your next appointment:   1 month(s) after your ablation  The format for your next appointment:   In Person  Provider:   AFib clinic   Thank you for choosing [facility]!!   Trinidad Curet, RN [phone]    Other Instructions   CT INSTRUCTIONS Your cardiac CT will be scheduled at:  [facility] [address] [place], [place] [address] [phone]   Please arrive at the [facility] main entrance of [facility] at ________________ on _________________, please arrive 30 minutes prior to test start time. Proceed to the [facility] Radiology Department (first floor) to check-in and test prep.  Please follow these instructions carefully (unless otherwise directed):  On the Night Before the Test:  Be sure to Drink plenty of water.  Do not consume any caffeinated/decaffeinated beverages or chocolate 12 hours prior to your test.  Do not take any antihistamines 12 hours prior to your test.   On the Day of the Test:  Drink plenty of water. Do not drink  any water within one hour of the test.  Do not eat any food 4 hours prior to the test.  You may take your regular medications prior to the test.   Take metoprolol (Lopressor) 50 mg  two hours prior to test.  HOLD Furosemide/Hydrochlorothiazide morning of the test.  FEMALES- please wear underwire-free bra if available       After the Test:  Drink  plenty of water.  After receiving IV contrast, you may experience a mild flushed feeling. This is normal.  On occasion, you may experience a mild rash up to 24 hours after the test. This is not dangerous. If this occurs, you can take Benadryl 25 mg and increase your fluid intake.  If you experience trouble breathing, this can be serious. If it is severe call 911 IMMEDIATELY. If it is mild, please call our office.  If you take any of these medications: Glipizide/Metformin, Avandament, Glucavance, please do not take 48 hours after completing test unless otherwise instructed.   Once we have confirmed authorization from your insurance company, we will call you to set up a date and time for your test. Based on how quickly your insurance processes prior authorizations requests, please allow up to 4 weeks to be contacted for scheduling your Cardiac CT appointment. Be advised that routine Cardiac CT appointments could be scheduled as many as 8 weeks after your provider has ordered it.  For non-scheduling related questions, please contact the cardiac imaging nurse navigator should you have any questions/concerns: Marchia Bond, Cardiac Imaging Nurse Navigator Burley Saver, Interim Cardiac Imaging Nurse [place] [facility] Direct Office Dial: [phone]   For scheduling needs, including cancellations and rescheduling, please call Vivien Rota at [phone], option 3.      Electrophysiology/Ablation Procedure Instructions   You are scheduled for a(n)  ablation on 05/04/2020 with Dr. Allegra Lai.   1.   Pre procedure testing-             A.  LAB WORK ---  Between 9/23 - 10/12 for your pre procedure blood work.  You do NOT need to be fasting.  You may stop by the [facility] office anytime that day between 7:30 am - 4:30 pm               B. COVID TEST-- On 05/02/2020 @ 2:00 pm -  This is a Drive Up Visit at [address] [place], [place] [address].  Someone will direct you to  the appropriate testing line. Stay in your car and someone will be with you shortly.   After you are tested please go home and self quarantine until the day of your procedure.     2. On the day of your procedure 05/04/2020 you will go to [facility] [phone]) at 8:30 am.  Dennis Bast will go to the main entrance A [employer]) and enter where the [employer] are.  Your driver will drop you off and you will head down the hallway to ADMITTING.  You may have one support person come in to the hospital with you.  They will be asked to wait in the waiting room.   3.   Do not eat or drink after midnight prior to your procedure.   4.   Do NOT take any medications the morning of your procedure.   5.  Plan for an overnight stay (if you go home same day, you will need someone to stay with  you for 24 hours after the procedure).  If you use your phone frequently bring your phone charger.   6. You will follow up with the AFIB clinic 4 weeks after your procedure.  You will follow up with Dr. Curt Bears  3 months after your procedure.  These appointments will be made for you.   * If you have ANY questions please call the office [phone] and ask for Lynnell Fiumara RN or send me a MyChart message   * Occasionally, EP Studies and ablations can become lengthy.  Please make your family aware of this before your procedure starts.  Average time ranges from 2-8 hours for EP studies/ablations.  Your physician will call your family after the procedure with the results.                                    Cardiac Ablation  Cardiac ablation is a procedure to stop some heart tissue from causing problems. The heart has many electrical connections. Sometimes these connections make the heart beat very fast or irregularly. Removing some problem areas can improve the heart rhythm or make it normal. What happens before the procedure?  Follow instructions from your doctor about what you cannot eat or drink.  Ask  your doctor about: ? Changing or stopping your normal medicines. This is important if you take diabetes medicines or blood thinners. ? Taking medicines such as aspirin and ibuprofen. These medicines can thin your blood. Do not take these medicines before your procedure if your doctor tells you not to.  Plan to have someone take you home.  If you will be going home right after the procedure, plan to have someone with you for 24 hours. What happens during the procedure?  To lower your risk of infection: ? Your health care team will wash or sanitize their hands. ? Your skin will be washed with soap. ? Hair may be removed from your neck or groin.  An IV tube will be put into one of your veins.  You will be given a medicine to help you relax (sedative).  Skin on your neck or groin will be numbed.  A cut (incision) will be made in your neck or groin.  A needle will be put through your cut and into a vein in your neck or groin.  A tube (catheter) will be put into the needle. The tube will be moved to your heart. X-rays (fluoroscopy) will be used to help guide the tube.  Small devices (electrodes) on the tip of the tube will send out electrical currents.  Dye may be put through the tube. This helps your surgeon see your heart.  Electrical energy will be used to scar (ablate) some heart tissue. Your surgeon may use: ? Heat (radiofrequency energy). ? Laser energy. ? Extreme cold (cryoablation).  The tube will be taken out.  Pressure will be held on your cut. This helps stop bleeding.  A bandage (dressing) will be put on your cut. The procedure may vary. What happens after the procedure?  You will be monitored until your medicines have worn off.  Your cut will be watched for bleeding. You will need to lie still for a few hours.  Do not drive for 24 hours or as long as your doctor tells you. Summary  Cardiac ablation is a procedure to stop some heart tissue from causing  problems.  Electrical energy will be  used to scar (ablate) some heart tissue. This information is not intended to replace advice given to you by your health care provider. Make sure you discuss any questions you have with your health care provider. Document Revised: 06/12/2017 Document Reviewed: 05/19/2016 Elsevier Patient Education  2020 [employer].

## 2020-03-27 ENCOUNTER — Other Ambulatory Visit: Payer: Self-pay | Admitting: Student

## 2020-03-27 DIAGNOSIS — I5032 Chronic diastolic (congestive) heart failure: Secondary | ICD-10-CM

## 2020-03-29 DIAGNOSIS — H04123 Dry eye syndrome of bilateral lacrimal glands: Secondary | ICD-10-CM | POA: Diagnosis not present

## 2020-03-29 DIAGNOSIS — H40013 Open angle with borderline findings, low risk, bilateral: Secondary | ICD-10-CM | POA: Diagnosis not present

## 2020-03-29 DIAGNOSIS — H353131 Nonexudative age-related macular degeneration, bilateral, early dry stage: Secondary | ICD-10-CM | POA: Diagnosis not present

## 2020-03-29 DIAGNOSIS — H2513 Age-related nuclear cataract, bilateral: Secondary | ICD-10-CM | POA: Diagnosis not present

## 2020-03-29 DIAGNOSIS — H18513 Endothelial corneal dystrophy, bilateral: Secondary | ICD-10-CM | POA: Diagnosis not present

## 2020-04-11 ENCOUNTER — Other Ambulatory Visit: Payer: Self-pay | Admitting: *Deleted

## 2020-04-11 MED ORDER — TORSEMIDE 20 MG PO TABS
ORAL_TABLET | ORAL | 3 refills | Status: DC
Start: 2020-04-11 — End: 2021-01-02

## 2020-04-18 ENCOUNTER — Telehealth: Payer: Self-pay | Admitting: Licensed Clinical Social Worker

## 2020-04-18 NOTE — Telephone Encounter (Signed)
CSW received referral to assist with transport to upcoming appointments and procedure. CSW contacted patient and left message that it's too early to arrange transport and will contact patient next week to arrange. [name], [place], [place]

## 2020-04-19 ENCOUNTER — Telehealth: Payer: Self-pay | Admitting: Cardiology

## 2020-04-19 NOTE — Telephone Encounter (Signed)
New message   Pt daughter asked for a call to go over instructions for procedure with pt.

## 2020-04-20 ENCOUNTER — Encounter: Payer: Self-pay | Admitting: *Deleted

## 2020-04-20 NOTE — Telephone Encounter (Signed)
Called and spoke to dtr. Reviewed Covid screening/pre procedure lab, TEE and ablation instructions. They will pick up instruction letter when they come on 10/20 for labs. Dtr verbalized understanding and agreeable to plan.

## 2020-04-24 ENCOUNTER — Telehealth: Payer: Self-pay | Admitting: Licensed Clinical Social Worker

## 2020-04-24 DIAGNOSIS — I5032 Chronic diastolic (congestive) heart failure: Secondary | ICD-10-CM | POA: Diagnosis not present

## 2020-04-24 DIAGNOSIS — G4733 Obstructive sleep apnea (adult) (pediatric): Secondary | ICD-10-CM | POA: Diagnosis not present

## 2020-04-24 NOTE — Telephone Encounter (Signed)
CSW referred to assist patient with transportation to labs and procedures on 10/20, 10/21 and 10/22. CSW referred patient to Alger Simons at [employer] for assistance with transport. Kasandra Knudsen will confirm pick up with patient's daughter. CSW available as needed. [name], [place], [place]

## 2020-04-27 ENCOUNTER — Ambulatory Visit (HOSPITAL_COMMUNITY): Payer: PPO

## 2020-05-02 ENCOUNTER — Other Ambulatory Visit: Payer: Self-pay

## 2020-05-02 ENCOUNTER — Other Ambulatory Visit: Payer: PPO | Admitting: *Deleted

## 2020-05-02 ENCOUNTER — Other Ambulatory Visit (HOSPITAL_COMMUNITY)
Admission: RE | Admit: 2020-05-02 | Discharge: 2020-05-02 | Disposition: A | Discharge status: Home / Self Care | Payer: PPO | Source: Ambulatory Visit | Attending: Cardiology | Admitting: Cardiology

## 2020-05-02 DIAGNOSIS — Z01812 Encounter for preprocedural laboratory examination: Secondary | ICD-10-CM | POA: Diagnosis not present

## 2020-05-02 DIAGNOSIS — Z20822 Contact with and (suspected) exposure to covid-19: Secondary | ICD-10-CM | POA: Diagnosis not present

## 2020-05-02 DIAGNOSIS — Z01818 Encounter for other preprocedural examination: Secondary | ICD-10-CM | POA: Insufficient documentation

## 2020-05-02 DIAGNOSIS — I4819 Other persistent atrial fibrillation: Secondary | ICD-10-CM | POA: Diagnosis not present

## 2020-05-02 LAB — BASIC METABOLIC PANEL
BUN/Creatinine Ratio: 26 (ref 12–28)
BUN: 40 mg/dL — ABNORMAL HIGH (ref 8–27)
CO2: 31 mmol/L — ABNORMAL HIGH (ref 20–29)
Calcium: 9.5 mg/dL (ref 8.7–10.3)
Chloride: 96 mmol/L (ref 96–106)
Creatinine, Ser: 1.56 mg/dL — ABNORMAL HIGH (ref 0.57–1.00)
GFR calc Af Amer: 38 mL/min/{1.73_m2} — ABNORMAL LOW (ref >59)
GFR calc non Af Amer: 33 mL/min/{1.73_m2} — ABNORMAL LOW (ref >59)
Glucose: 103 mg/dL — ABNORMAL HIGH (ref 65–99)
Potassium: 4 mmol/L (ref 3.5–5.2)
Sodium: 139 mmol/L (ref 134–144)

## 2020-05-02 LAB — CBC
Hematocrit: 31.4 % — ABNORMAL LOW (ref 34.0–46.6)
Hemoglobin: 9.8 g/dL — ABNORMAL LOW (ref 11.1–15.9)
MCH: 27.2 pg (ref 26.6–33.0)
MCHC: 31.2 g/dL — ABNORMAL LOW (ref 31.5–35.7)
MCV: 87 fL (ref 79–97)
Platelets: 216 10*3/uL (ref 150–450)
RBC: 3.6 x10E6/uL — ABNORMAL LOW (ref 3.77–5.28)
RDW: 17.1 % — ABNORMAL HIGH (ref 11.7–15.4)
WBC: 5.4 10*3/uL (ref 3.4–10.8)

## 2020-05-02 LAB — SARS CORONAVIRUS 2 (TAT 6-24 HRS): SARS Coronavirus 2: NEGATIVE

## 2020-05-03 ENCOUNTER — Ambulatory Visit (HOSPITAL_COMMUNITY): Payer: PPO | Admitting: Anesthesiology

## 2020-05-03 ENCOUNTER — Ambulatory Visit (HOSPITAL_COMMUNITY)
Admission: RE | Admit: 2020-05-03 | Discharge: 2020-05-03 | Disposition: A | Discharge status: Home / Self Care | Payer: PPO | Attending: Cardiology | Admitting: Cardiology

## 2020-05-03 ENCOUNTER — Other Ambulatory Visit: Payer: Self-pay

## 2020-05-03 ENCOUNTER — Encounter (HOSPITAL_COMMUNITY)
Admission: RE | Disposition: A | Discharge status: Home / Self Care | Payer: Self-pay | Source: Home / Self Care | Attending: Cardiology

## 2020-05-03 ENCOUNTER — Encounter (HOSPITAL_COMMUNITY): Payer: Self-pay | Admitting: Cardiology

## 2020-05-03 ENCOUNTER — Ambulatory Visit (HOSPITAL_BASED_OUTPATIENT_CLINIC_OR_DEPARTMENT_OTHER)
Admission: RE | Admit: 2020-05-03 | Discharge: 2020-05-03 | Disposition: A | Discharge status: Home / Self Care | Payer: PPO | Source: Ambulatory Visit | Attending: Cardiology | Admitting: Cardiology

## 2020-05-03 DIAGNOSIS — I081 Rheumatic disorders of both mitral and tricuspid valves: Secondary | ICD-10-CM | POA: Diagnosis not present

## 2020-05-03 DIAGNOSIS — I110 Hypertensive heart disease with heart failure: Secondary | ICD-10-CM | POA: Diagnosis not present

## 2020-05-03 DIAGNOSIS — E669 Obesity, unspecified: Secondary | ICD-10-CM | POA: Insufficient documentation

## 2020-05-03 DIAGNOSIS — Z6841 Body Mass Index (BMI) 40.0 and over, adult: Secondary | ICD-10-CM | POA: Diagnosis not present

## 2020-05-03 DIAGNOSIS — I4891 Unspecified atrial fibrillation: Secondary | ICD-10-CM | POA: Diagnosis not present

## 2020-05-03 DIAGNOSIS — I509 Heart failure, unspecified: Secondary | ICD-10-CM | POA: Diagnosis not present

## 2020-05-03 DIAGNOSIS — I4819 Other persistent atrial fibrillation: Secondary | ICD-10-CM | POA: Diagnosis not present

## 2020-05-03 DIAGNOSIS — N189 Chronic kidney disease, unspecified: Secondary | ICD-10-CM | POA: Insufficient documentation

## 2020-05-03 DIAGNOSIS — I361 Nonrheumatic tricuspid (valve) insufficiency: Secondary | ICD-10-CM | POA: Diagnosis not present

## 2020-05-03 DIAGNOSIS — I340 Nonrheumatic mitral (valve) insufficiency: Secondary | ICD-10-CM

## 2020-05-03 DIAGNOSIS — I480 Paroxysmal atrial fibrillation: Secondary | ICD-10-CM | POA: Diagnosis not present

## 2020-05-03 DIAGNOSIS — E119 Type 2 diabetes mellitus without complications: Secondary | ICD-10-CM | POA: Diagnosis not present

## 2020-05-03 DIAGNOSIS — I5033 Acute on chronic diastolic (congestive) heart failure: Secondary | ICD-10-CM | POA: Diagnosis not present

## 2020-05-03 HISTORY — PX: TEE WITHOUT CARDIOVERSION: SHX5443

## 2020-05-03 LAB — ECHO TEE
MV M vel: 6.03 m/s
MV Peak grad: 145.4 mmHg
Radius: 0.8 cm

## 2020-05-03 LAB — GLUCOSE, CAPILLARY: Glucose-Capillary: 106 mg/dL — ABNORMAL HIGH (ref 70–99)

## 2020-05-03 SURGERY — ECHOCARDIOGRAM, TRANSESOPHAGEAL
Anesthesia: Monitor Anesthesia Care

## 2020-05-03 MED ORDER — BUTAMBEN-TETRACAINE-BENZOCAINE 2-2-14 % EX AERO
INHALATION_SPRAY | CUTANEOUS | Status: DC | PRN
  Administered 2020-05-03: 2 via TOPICAL

## 2020-05-03 MED ORDER — SODIUM CHLORIDE 0.9 % IV SOLN: INTRAVENOUS | Status: DC

## 2020-05-03 MED ORDER — LACTATED RINGERS IV SOLN: INTRAVENOUS | Status: DC | PRN

## 2020-05-03 MED ORDER — PROPOFOL 10 MG/ML IV BOLUS
INTRAVENOUS | Status: DC | PRN
  Administered 2020-05-03 (×3): 20 mg via INTRAVENOUS

## 2020-05-03 MED ORDER — DEXMEDETOMIDINE HCL 200 MCG/2ML IV SOLN: INTRAVENOUS | Status: DC | PRN

## 2020-05-03 MED ORDER — PROPOFOL 500 MG/50ML IV EMUL
INTRAVENOUS | Status: DC | PRN
  Administered 2020-05-03: 50 ug/kg/min via INTRAVENOUS

## 2020-05-03 MED ORDER — LIDOCAINE HCL (CARDIAC) PF 100 MG/5ML IV SOSY
PREFILLED_SYRINGE | INTRAVENOUS | Status: DC | PRN
  Administered 2020-05-03: 50 mg via INTRAVENOUS

## 2020-05-03 MED ORDER — DEXMEDETOMIDINE (PRECEDEX) IN NS 20 MCG/5ML (4 MCG/ML) IV SYRINGE
PREFILLED_SYRINGE | INTRAVENOUS | Status: DC | PRN
  Administered 2020-05-03 (×3): 8 ug via INTRAVENOUS
  Administered 2020-05-03: 4 ug via INTRAVENOUS

## 2020-05-03 NOTE — CV Procedure (Signed)
     TRANSESOPHAGEAL ECHOCARDIOGRAM   NAME:  Hannah [name]   MRN: [id number] DOB:  [birth year]   ADMIT DATE: 05/03/2020  INDICATIONS: Afib  PROCEDURE:   Informed consent was obtained prior to the procedure. The risks, benefits and alternatives for the procedure were discussed and the patient comprehended these risks.  Risks include, but are not limited to, cough, sore throat, vomiting, nausea, somnolence, esophageal and stomach trauma or perforation, bleeding, low blood pressure, aspiration, pneumonia, infection, trauma to the teeth and death.    After a procedural time-out, the oropharynx was anesthetized and the patient was sedated by the anesthesia service. The transesophageal probe was inserted in the esophagus and stomach without difficulty and multiple views were obtained. Anesthesia was monitored by Tamala Fothergill, CRNA.    COMPLICATIONS:    There were no immediate complications.  FINDINGS:  No LAA thrombus   Oswaldo Milian MD [facility]  [address] [place], [place] [address] [phone]   11:31 AM

## 2020-05-03 NOTE — Discharge Instructions (Signed)

## 2020-05-03 NOTE — Anesthesia Procedure Notes (Signed)
Procedure Name: MAC Date/Time: 05/03/2020 10:19 AM Performed by: Mariea Clonts, CRNA Pre-anesthesia Checklist: Patient identified, Emergency Drugs available, Suction available, Patient being monitored and Timeout performed Patient Re-evaluated:Patient Re-evaluated prior to induction Oxygen Delivery Method: Nasal cannula and Simple face mask

## 2020-05-03 NOTE — Transfer of Care (Signed)
Immediate Anesthesia Transfer of Care Note  Patient: Hannah [name]  Procedure(s) Performed: TRANSESOPHAGEAL ECHOCARDIOGRAM (TEE) (N/A )  Patient Location: Endoscopy Unit  Anesthesia Type:MAC  Level of Consciousness: awake, alert  and oriented  Airway & Oxygen Therapy: Patient Spontanous Breathing and Patient connected to nasal cannula oxygen  Post-op Assessment: Report given to RN, Post -op Vital signs reviewed and stable and Patient moving all extremities X 4  Post vital signs: Reviewed and stable  Last Vitals:  Vitals Value Taken Time  BP    Temp    Pulse    Resp    SpO2      Last Pain:  Vitals:   05/03/20 0954  TempSrc: Temporal  PainSc: 0-No pain         Complications: No complications documented.

## 2020-05-03 NOTE — H&P (Signed)
69F with persistent AF, HFpEF, obesity, CKD presents for TEE prior to AF ablation tomorrow.  Doing well, denies any chest pain or dyspnea.  GEN: in no acute distress HEENT: normal Neck: no JVD Cardiac: irregular, no murmurs Respiratory:  clear to auscultation bilaterally GI: soft, nontender MS: no deformity or atrophy Skin: warm and dry Neuro:  Alert and Oriented x 3 Psych: euthymic mood, full affect  Plan for TEE today to rule out LAA thrombus prior to ablation tomorrow

## 2020-05-04 ENCOUNTER — Encounter (HOSPITAL_COMMUNITY): Payer: Self-pay | Admitting: Cardiology

## 2020-05-04 ENCOUNTER — Ambulatory Visit (HOSPITAL_COMMUNITY)
Admission: RE | Admit: 2020-05-04 | Discharge: 2020-05-04 | Disposition: A | Discharge status: Home / Self Care | Payer: PPO | Attending: Cardiology | Admitting: Cardiology

## 2020-05-04 ENCOUNTER — Other Ambulatory Visit: Payer: Self-pay

## 2020-05-04 ENCOUNTER — Ambulatory Visit (HOSPITAL_COMMUNITY): Payer: PPO | Admitting: Anesthesiology

## 2020-05-04 ENCOUNTER — Encounter (HOSPITAL_COMMUNITY)
Admission: RE | Disposition: A | Discharge status: Home / Self Care | Payer: Self-pay | Source: Home / Self Care | Attending: Cardiology

## 2020-05-04 DIAGNOSIS — I480 Paroxysmal atrial fibrillation: Secondary | ICD-10-CM | POA: Diagnosis not present

## 2020-05-04 DIAGNOSIS — I4819 Other persistent atrial fibrillation: Secondary | ICD-10-CM | POA: Insufficient documentation

## 2020-05-04 DIAGNOSIS — I5032 Chronic diastolic (congestive) heart failure: Secondary | ICD-10-CM | POA: Insufficient documentation

## 2020-05-04 DIAGNOSIS — E119 Type 2 diabetes mellitus without complications: Secondary | ICD-10-CM | POA: Diagnosis not present

## 2020-05-04 DIAGNOSIS — I5033 Acute on chronic diastolic (congestive) heart failure: Secondary | ICD-10-CM | POA: Diagnosis not present

## 2020-05-04 DIAGNOSIS — E785 Hyperlipidemia, unspecified: Secondary | ICD-10-CM | POA: Diagnosis not present

## 2020-05-04 DIAGNOSIS — Z6841 Body Mass Index (BMI) 40.0 and over, adult: Secondary | ICD-10-CM | POA: Diagnosis not present

## 2020-05-04 DIAGNOSIS — I110 Hypertensive heart disease with heart failure: Secondary | ICD-10-CM | POA: Insufficient documentation

## 2020-05-04 DIAGNOSIS — E079 Disorder of thyroid, unspecified: Secondary | ICD-10-CM | POA: Insufficient documentation

## 2020-05-04 DIAGNOSIS — E039 Hypothyroidism, unspecified: Secondary | ICD-10-CM | POA: Diagnosis not present

## 2020-05-04 DIAGNOSIS — Z880 Allergy status to penicillin: Secondary | ICD-10-CM | POA: Diagnosis not present

## 2020-05-04 HISTORY — PX: ATRIAL FIBRILLATION ABLATION: EP1191

## 2020-05-04 LAB — GLUCOSE, CAPILLARY
Glucose-Capillary: 116 mg/dL — ABNORMAL HIGH (ref 70–99)
Glucose-Capillary: 159 mg/dL — ABNORMAL HIGH (ref 70–99)

## 2020-05-04 LAB — POCT ACTIVATED CLOTTING TIME
Activated Clotting Time: 202 seconds
Activated Clotting Time: 246 seconds
Activated Clotting Time: 307 seconds
Activated Clotting Time: 318 seconds
Activated Clotting Time: 323 seconds

## 2020-05-04 SURGERY — ATRIAL FIBRILLATION ABLATION
Anesthesia: General

## 2020-05-04 MED ORDER — HEPARIN (PORCINE) IN NACL 1000-0.9 UT/500ML-% IV SOLN
INTRAVENOUS | Status: AC
  Filled 2020-05-04: qty 500

## 2020-05-04 MED ORDER — SODIUM CHLORIDE 0.9 % IV SOLN: 250.0000 mL | INTRAVENOUS | Status: DC | PRN

## 2020-05-04 MED ORDER — DOBUTAMINE IN D5W 4-5 MG/ML-% IV SOLN
INTRAVENOUS | Status: AC
  Filled 2020-05-04: qty 250

## 2020-05-04 MED ORDER — HEPARIN SODIUM (PORCINE) 1000 UNIT/ML IJ SOLN
INTRAMUSCULAR | Status: DC | PRN
  Administered 2020-05-04: 15000 [IU] via INTRAVENOUS
  Administered 2020-05-04: 4000 [IU] via INTRAVENOUS
  Administered 2020-05-04: 2000 [IU] via INTRAVENOUS
  Administered 2020-05-04 (×2): 10000 [IU] via INTRAVENOUS
  Administered 2020-05-04: 3000 [IU] via INTRAVENOUS

## 2020-05-04 MED ORDER — DEXAMETHASONE SODIUM PHOSPHATE 10 MG/ML IJ SOLN
INTRAMUSCULAR | Status: DC | PRN
  Administered 2020-05-04: 8 mg via INTRAVENOUS

## 2020-05-04 MED ORDER — DOBUTAMINE IN D5W 4-5 MG/ML-% IV SOLN
INTRAVENOUS | Status: DC | PRN
  Administered 2020-05-04: 20 ug/kg/min via INTRAVENOUS

## 2020-05-04 MED ORDER — SUCCINYLCHOLINE CHLORIDE 200 MG/10ML IV SOSY
PREFILLED_SYRINGE | INTRAVENOUS | Status: DC | PRN
  Administered 2020-05-04: 140 mg via INTRAVENOUS

## 2020-05-04 MED ORDER — ACETAMINOPHEN 500 MG PO TABS
ORAL_TABLET | ORAL | Status: AC
  Administered 2020-05-04: 1000 mg via ORAL
  Filled 2020-05-04: qty 2

## 2020-05-04 MED ORDER — PROPOFOL 10 MG/ML IV BOLUS
INTRAVENOUS | Status: DC | PRN
  Administered 2020-05-04: 150 mg via INTRAVENOUS

## 2020-05-04 MED ORDER — ACETAMINOPHEN 325 MG PO TABS: 650.0000 mg | ORAL_TABLET | ORAL | Status: DC | PRN

## 2020-05-04 MED ORDER — SODIUM CHLORIDE 0.9% FLUSH: 3.0000 mL | INTRAVENOUS | Status: DC | PRN

## 2020-05-04 MED ORDER — SUGAMMADEX SODIUM 200 MG/2ML IV SOLN
INTRAVENOUS | Status: DC | PRN
  Administered 2020-05-04: 300 mg via INTRAVENOUS

## 2020-05-04 MED ORDER — KETOROLAC TROMETHAMINE 0.5 % OP SOLN
1.0000 [drp] | Freq: Four times a day (QID) | OPHTHALMIC | Status: DC | PRN
  Administered 2020-05-04: 1 [drp] via OPHTHALMIC
  Filled 2020-05-04: qty 5

## 2020-05-04 MED ORDER — CELECOXIB 200 MG PO CAPS
ORAL_CAPSULE | ORAL | Status: AC
  Administered 2020-05-04: 200 mg via ORAL
  Filled 2020-05-04: qty 1

## 2020-05-04 MED ORDER — HEPARIN SODIUM (PORCINE) 1000 UNIT/ML IJ SOLN
INTRAMUSCULAR | Status: AC
  Filled 2020-05-04: qty 1

## 2020-05-04 MED ORDER — FENTANYL CITRATE (PF) 100 MCG/2ML IJ SOLN
INTRAMUSCULAR | Status: DC | PRN
  Administered 2020-05-04: 100 ug via INTRAVENOUS

## 2020-05-04 MED ORDER — ROCURONIUM BROMIDE 10 MG/ML (PF) SYRINGE
PREFILLED_SYRINGE | INTRAVENOUS | Status: DC | PRN
  Administered 2020-05-04: 40 mg via INTRAVENOUS

## 2020-05-04 MED ORDER — SODIUM CHLORIDE 0.9 % IV SOLN: INTRAVENOUS | Status: DC

## 2020-05-04 MED ORDER — HEPARIN (PORCINE) IN NACL 2-0.9 UNITS/ML
INTRAMUSCULAR | Status: AC | PRN
  Administered 2020-05-04 (×5): 500 mL

## 2020-05-04 MED ORDER — PROTAMINE SULFATE 10 MG/ML IV SOLN
INTRAVENOUS | Status: DC | PRN
  Administered 2020-05-04 (×3): 10 mg via INTRAVENOUS
  Administered 2020-05-04: 20 mg via INTRAVENOUS
  Administered 2020-05-04 (×2): 10 mg via INTRAVENOUS

## 2020-05-04 MED ORDER — ONDANSETRON HCL 4 MG/2ML IJ SOLN
INTRAMUSCULAR | Status: DC | PRN
  Administered 2020-05-04: 4 mg via INTRAVENOUS

## 2020-05-04 MED ORDER — SODIUM CHLORIDE 0.9% FLUSH: 3.0000 mL | Freq: Two times a day (BID) | INTRAVENOUS | Status: DC

## 2020-05-04 MED ORDER — ONDANSETRON HCL 4 MG/2ML IJ SOLN: 4.0000 mg | Freq: Four times a day (QID) | INTRAMUSCULAR | Status: DC | PRN

## 2020-05-04 MED ORDER — LIDOCAINE 20MG/ML (2%) 15 ML SYRINGE OPTIME
INTRAMUSCULAR | Status: DC | PRN
  Administered 2020-05-04: 100 mg via INTRAVENOUS

## 2020-05-04 MED ORDER — ACETAMINOPHEN 500 MG PO TABS: 1000.0000 mg | ORAL_TABLET | Freq: Once | ORAL | Status: AC

## 2020-05-04 MED ORDER — HEPARIN SODIUM (PORCINE) 1000 UNIT/ML IJ SOLN
INTRAMUSCULAR | Status: DC | PRN
  Administered 2020-05-04 (×2): 1000 [IU] via INTRAVENOUS

## 2020-05-04 MED ORDER — LABETALOL HCL 5 MG/ML IV SOLN
INTRAVENOUS | Status: DC | PRN
  Administered 2020-05-04 (×2): 5 mg via INTRAVENOUS

## 2020-05-04 MED ORDER — CELECOXIB 200 MG PO CAPS: 200.0000 mg | ORAL_CAPSULE | Freq: Once | ORAL | Status: AC

## 2020-05-04 SURGICAL SUPPLY — 21 items
BAG SNAP BAND KOVER 36X36 (MISCELLANEOUS) ×3 IMPLANT
BLANKET WARM UNDERBOD FULL ACC (MISCELLANEOUS) ×3 IMPLANT
CATH 8FR REPROCESSED SOUNDSTAR (CATHETERS) ×3 IMPLANT
CATH MAPPNG PENTARAY F 2-6-2MM (CATHETERS) ×1 IMPLANT
CATH S CIRCA THERM PROBE 10F (CATHETERS) ×3 IMPLANT
CATH SMTCH THERMOCOOL SF DF (CATHETERS) ×3 IMPLANT
CATH WEBSTER BI DIR CS D-F CRV (CATHETERS) ×6 IMPLANT
CLOSURE PERCLOSE PROSTYLE (VASCULAR PRODUCTS) ×12 IMPLANT
COVER SWIFTLINK CONNECTOR (BAG) ×3 IMPLANT
KIT VERSACROSS STEERABLE D1 (CATHETERS) ×3 IMPLANT
PACK EP LATEX FREE (CUSTOM PROCEDURE TRAY) ×2
PACK EP LF (CUSTOM PROCEDURE TRAY) ×1 IMPLANT
PAD PRO RADIOLUCENT 2001M-C (PAD) ×3 IMPLANT
PATCH CARTO3 (PAD) ×3 IMPLANT
PENTARAY F 2-6-2MM (CATHETERS) ×3
SHEATH CARTO VIZIGO SM CVD (SHEATH) ×3 IMPLANT
SHEATH PINNACLE 7F 10CM (SHEATH) ×3 IMPLANT
SHEATH PINNACLE 8F 10CM (SHEATH) ×6 IMPLANT
SHEATH PINNACLE 9F 10CM (SHEATH) ×3 IMPLANT
SHEATH PROBE COVER 6X72 (BAG) ×3 IMPLANT
TUBING SMART ABLATE COOLFLOW (TUBING) ×3 IMPLANT

## 2020-05-04 NOTE — Anesthesia Preprocedure Evaluation (Addendum)
Anesthesia Evaluation  Patient identified by MRN, date of birth, ID band Patient awake    Reviewed: Allergy & Precautions, NPO status , Patient's Chart, lab work & pertinent test results  Airway Mallampati: III  TM Distance: >3 FB     Dental  (+) Teeth Intact   Pulmonary    breath sounds clear to auscultation       Cardiovascular hypertension,  Rhythm:Irregular Rate:Normal     Neuro/Psych    GI/Hepatic   Endo/Other  diabetes  Renal/GU      Musculoskeletal   Abdominal (+) + obese,   Peds  Hematology   Anesthesia Other Findings   Reproductive/Obstetrics                            Anesthesia Physical Anesthesia Plan  ASA: III  Anesthesia Plan: MAC   Post-op Pain Management:    Induction: Intravenous  PONV Risk Score and Plan: Propofol infusion  Airway Management Planned: Nasal Cannula and Natural Airway  Additional Equipment:   Intra-op Plan:   Post-operative Plan: Extubation in OR  Informed Consent: I have reviewed the patients History and Physical, chart, labs and discussed the procedure including the risks, benefits and alternatives for the proposed anesthesia with the patient or authorized representative who has indicated his/her understanding and acceptance.       Plan Discussed with:   Anesthesia Plan Comments:         Anesthesia Quick Evaluation

## 2020-05-04 NOTE — Anesthesia Postprocedure Evaluation (Signed)
Anesthesia Post Note  Patient: Hannah [name]  Procedure(s) Performed: ATRIAL FIBRILLATION ABLATION (N/A )     Patient location during evaluation: Endoscopy Anesthesia Type: General Level of consciousness: awake and alert Pain management: pain level controlled Vital Signs Assessment: post-procedure vital signs reviewed and stable Respiratory status: spontaneous breathing, nonlabored ventilation, respiratory function stable and patient connected to nasal cannula oxygen Cardiovascular status: blood pressure returned to baseline and stable Postop Assessment: no apparent nausea or vomiting Anesthetic complications: no Comments: See progress notes. Possible mild R. Corneal abrasion.   No complications documented.  Last Vitals:  Vitals:   05/04/20 1600 05/04/20 1632  BP: 128/67 (!) 115/49  Pulse: 62 61  Resp: 13 13  Temp: (!) 36.2 C (!) 36.2 C  SpO2:      Last Pain:  Vitals:   05/04/20 1632  TempSrc: Temporal  PainSc: 0-No pain                 Ronya Gilcrest COKER

## 2020-05-04 NOTE — Discharge Instructions (Signed)
Scheduled for Ablation tomorrow, 05/04/2020, arrive at [facility] @ 8:30.  Nothing to eat or drink after midnight, no medications in the morning, need responsible adult to drive you home and stay overnight with you.  Be sure to take Eliquis today.  Cardiac Ablation, Care After  This sheet gives you information about how to care for yourself after your procedure. Your health care provider may also give you more specific instructions. If you have problems or questions, contact your health care provider. What can I expect after the procedure? After the procedure, it is common to have:  Bruising around your puncture site.  Tenderness around your puncture site.  Skipped heartbeats.  Tiredness (fatigue).  Follow these instructions at home: Puncture site care   Follow instructions from your health care provider about how to take care of your puncture site. Make sure you: ? If present, leave stitches (sutures), skin glue, or adhesive strips in place. These skin closures may need to stay in place for up to 2 weeks. If adhesive strip edges start to loosen and curl up, you may trim the loose edges. Do not remove adhesive strips completely unless your health care provider tells you to do that. ? If a large square bandage is present, this may be removed 24 hours after surgery.   Check your puncture site every day for signs of infection. Check for: ? Redness, swelling, or pain. ? Fluid or blood. If your puncture site starts to bleed, lie down on your back, apply firm pressure to the area, and contact your health care provider. ? Warmth. ? Pus or a bad smell. Driving  Do not drive for at least 4 days after your procedure or however long your health care provider recommends. (Do not resume driving if you have previously been instructed not to drive for other health reasons.)  Do not drive or use heavy machinery while taking prescription pain medicine. Activity  Avoid activities that take a lot of  effort for at least 7 days after your procedure.  Do not lift anything that is heavier than 5 lb (4.5 kg) for one week.   No sexual activity for 1 week.   Return to your normal activities as told by your health care provider. Ask your health care provider what activities are safe for you. General instructions  Take over-the-counter and prescription medicines only as told by your health care provider.  Do not use any products that contain nicotine or tobacco, such as cigarettes and e-cigarettes. If you need help quitting, ask your health care provider.  You may shower after 24 hours, but Do not take baths, swim, or use a hot tub for 1 week.   Do not drink alcohol for 24 hours after your procedure.  Keep all follow-up visits as told by your health care provider. This is important. Contact a health care provider if:  You have redness, mild swelling, or pain around your puncture site.  You have fluid or blood coming from your puncture site that stops after applying firm pressure to the area.  Your puncture site feels warm to the touch.  You have pus or a bad smell coming from your puncture site.  You have a fever.  You have chest pain or discomfort that spreads to your neck, jaw, or arm.  You are sweating a lot.  You feel nauseous.  You have a fast or irregular heartbeat.  You have shortness of breath.  You are dizzy or light-headed and feel the  need to lie down.  You have pain or numbness in the arm or leg closest to your puncture site. Get help right away if:  Your puncture site suddenly swells.  Your puncture site is bleeding and the bleeding does not stop after applying firm pressure to the area. These symptoms may represent a serious problem that is an emergency. Do not wait to see if the symptoms will go away. Get medical help right away. Call your local emergency services (911 in the [place] Do not drive yourself to the hospital. Summary  After the procedure, it  is normal to have bruising and tenderness at the puncture site in your groin, neck, or forearm.  Check your puncture site every day for signs of infection.  Get help right away if your puncture site is bleeding and the bleeding does not stop after applying firm pressure to the area. This is a medical emergency. This information is not intended to replace advice given to you by your health care provider. Make sure you discuss any questions you have with your health care provider.    Femoral Site Care This sheet gives you information about how to care for yourself after your procedure. Your health care provider may also give you more specific instructions. If you have problems or questions, contact your health care provider. What can I expect after the procedure? After the procedure, it is common to have:  Bruising that usually fades within 1-2 weeks.  Tenderness at the site. Follow these instructions at home: Wound care  Follow instructions from your health care provider about how to take care of your insertion site. Make sure you: ? Wash your hands with soap and water before you change your bandage (dressing). If soap and water are not available, use hand sanitizer. ? Change your dressing as told by your health care provider. ? Leave stitches (sutures), skin glue, or adhesive strips in place. These skin closures may need to stay in place for 2 weeks or longer. If adhesive strip edges start to loosen and curl up, you may trim the loose edges. Do not remove adhesive strips completely unless your health care provider tells you to do that.  Do not take baths, swim, or use a hot tub until your health care provider approves.  You may shower 24-48 hours after the procedure or as told by your health care provider. ? Gently wash the site with plain soap and water. ? Pat the area dry with a clean towel. ? Do not rub the site. This may cause bleeding.  Do not apply powder or lotion to the site.  Keep the site clean and dry.  Check your femoral site every day for signs of infection. Check for: ? Redness, swelling, or pain. ? Fluid or blood. ? Warmth. ? Pus or a bad smell. Activity  For the first 2-3 days after your procedure, or as long as directed: ? Avoid climbing stairs as much as possible. ? Do not squat.  Do not lift anything that is heavier than 10 lb (4.5 kg), or the limit that you are told, until your health care provider says that it is safe.  Rest as directed. ? Avoid sitting for a long time without moving. Get up to take short walks every 1-2 hours.  Do not drive for 24 hours if you were given a medicine to help you relax (sedative). General instructions  Take over-the-counter and prescription medicines only as told by your health care provider.  Keep  all follow-up visits as told by your health care provider. This is important. Contact a health care provider if you have:  A fever or chills.  You have redness, swelling, or pain around your insertion site. Get help right away if:  The catheter insertion area swells very fast.  You pass out.  You suddenly start to sweat or your skin gets clammy.  The catheter insertion area is bleeding, and the bleeding does not stop when you hold steady pressure on the area.  The area near or just beyond the catheter insertion site becomes pale, cool, tingly, or numb. These symptoms may represent a serious problem that is an emergency. Do not wait to see if the symptoms will go away. Get medical help right away. Call your local emergency services (911 in the [place] Do not drive yourself to the hospital. Summary  After the procedure, it is common to have bruising that usually fades within 1-2 weeks.  Check your femoral site every day for signs of infection.  Do not lift anything that is heavier than 10 lb (4.5 kg), or the limit that you are told, until your health care provider says that it is safe. This information is  not intended to replace advice given to you by your health care provider. Make sure you discuss any questions you have with your health care provider. Document Revised: 07/13/2017 Document Reviewed: 07/13/2017 Elsevier Patient Education  2020 [employer].

## 2020-05-04 NOTE — Transfer of Care (Signed)
Immediate Anesthesia Transfer of Care Note  Patient: Hannah [name]  Procedure(s) Performed: ATRIAL FIBRILLATION ABLATION (N/A )  Patient Location: PACU  Anesthesia Type:General  Level of Consciousness: awake, drowsy and patient cooperative  Airway & Oxygen Therapy: Patient Spontanous Breathing and Patient connected to face mask oxygen  Post-op Assessment: Report given to RN and Post -op Vital signs reviewed and stable  Post vital signs: Reviewed and stable  Last Vitals:  Vitals Value Taken Time  BP 115/49 05/04/20 1629  Temp 36.2 C 05/04/20 1600  Pulse 58 05/04/20 1633  Resp 17 05/04/20 1633  SpO2 99 % 05/04/20 1633  Vitals shown include unvalidated device data.  Last Pain:  Vitals:   05/04/20 1600  TempSrc: Temporal  PainSc: 0-No pain         Complications: No complications documented.

## 2020-05-04 NOTE — H&P (Signed)
Electrophysiology Office Note   Date:  05/04/2020   ID:  Day [name], DOB 17-Mar-1946, MRN [id number]  PCP:  Lujean Amel, MD Primary Electrophysiologist:  Constance Haw, MD    No chief complaint on file.    History of Present Illness: Hannah [name] is a 74 y.o. female who is being seen today for the evaluation of CHF, AF at the request of No ref. provider found. Presenting today for electrophysiology evaluation.  She has a history of atrial fibrillation, chronic diastolic heart failure, and hyperlipidemia as well as morbid obesity.  She has been on amiodarone, but unfortunately has had multiple cardioversions and has reverted back to atrial fibrillation.  Today, denies symptoms of palpitations, chest pain, shortness of breath, orthopnea, PND, lower extremity edema, claudication, dizziness, presyncope, syncope, bleeding, or neurologic sequela. The patient is tolerating medications without difficulties. Plan for AF ablation today.   Past Medical History:  Diagnosis Date  . Atrial fibrillation ([place])   . Diabetes mellitus without complication ([place])   . Glaucoma    BILATERAL  . Hypertension   . Thyroid disease   . Volume overload 11/2019   Past Surgical History:  Procedure Laterality Date  . APPENDECTOMY    . CARDIOVERSION  11/24/2019  . CARDIOVERSION N/A 11/24/2019   Procedure: CARDIOVERSION;  Surgeon: Larey Dresser, MD;  Location: [facility] ENDOSCOPY;  Service: Cardiovascular;  Laterality: N/A;  . CARDIOVERSION N/A 11/29/2019   Procedure: CARDIOVERSION;  Surgeon: Larey Dresser, MD;  Location: [facility] ENDOSCOPY;  Service: Cardiovascular;  Laterality: N/A;  . CHOLECYSTECTOMY    . HERNIA REPAIR     UMBILICAL  . LASIK     BILATERAL  . TEE WITHOUT CARDIOVERSION N/A 11/24/2019   Procedure: TRANSESOPHAGEAL ECHOCARDIOGRAM (TEE);  Surgeon: Larey Dresser, MD;  Location: [facility] ENDOSCOPY;  Service: Cardiovascular;  Laterality: N/A;  . TEE WITHOUT CARDIOVERSION N/A  05/03/2020   Procedure: TRANSESOPHAGEAL ECHOCARDIOGRAM (TEE);  Surgeon: Donato Heinz, MD;  Location: [facility] ENDOSCOPY;  Service: Cardiovascular;  Laterality: N/A;     Current Facility-Administered Medications  Medication Dose Route Frequency Provider Last Rate Last Admin  . 0.9 %  sodium chloride infusion   Intravenous Continuous Constance Haw, MD 50 mL/hr at 05/04/20 1035 Continued from Pre-op at 05/04/20 1035    Allergies:   Penicillins   Social History:  The patient  reports that she has never smoked. She has never used smokeless tobacco. She reports current alcohol use of about 1.0 standard drink of alcohol per week. She reports that she does not use drugs.   Family History:  The patient's family history includes Colon cancer in her father; Heart attack in her brother and paternal grandfather; Heart disease in her brother; Lung cancer in her maternal grandfather and mother.   ROS:  Please see the history of present illness.   Otherwise, review of systems is positive for none.   All other systems are reviewed and negative.   PHYSICAL EXAM: VS:  BP (!) 117/50   Pulse 80   Temp 97.7 F (36.5 C) (Oral)   Resp 16   Ht 5\' 6"  (1.676 m)   Wt 134.7 kg   SpO2 100%   BMI 47.94 kg/m  , BMI Body mass index is 47.94 kg/m. GEN: Well nourished, well developed, in no acute distress  HEENT: normal  Neck: no JVD, carotid bruits, or masses Cardiac: irregular; no murmurs, rubs, or gallops,no edema  Respiratory:  clear to auscultation bilaterally, normal work of breathing GI: soft,  nontender, nondistended, + BS MS: no deformity or atrophy  Skin: warm and dry Neuro:  Strength and sensation are intact Psych: euthymic mood, full affect  Recent Labs: 11/26/2019: Magnesium 2.2 02/27/2020: ALT 17; TSH 6.923 05/02/2020: BUN 40; Creatinine, Ser 1.56; Hemoglobin 9.8; Platelets 216; Potassium 4.0; Sodium 139    Lipid Panel  No results found for: CHOL, TRIG, HDL, CHOLHDL, VLDL,  LDLCALC, LDLDIRECT   Wt Readings from Last 3 Encounters:  05/04/20 134.7 kg  05/03/20 134.7 kg  03/20/20 121.6 kg      Other studies Reviewed: Additional studies/ records that were reviewed today include: TTE 01/18/18 Review of the above records today demonstrates:  - Left ventricle: The cavity size was mildly dilated. Systolic   function was normal. The estimated ejection fraction was in the   range of 60% to 65%. Wall motion was normal; there were no   regional wall motion abnormalities. Left ventricular diastolic   function parameters were normal. - Left atrium: The atrium was severely dilated. - Atrial septum: There was increased thickness of the septum,   consistent with lipomatous hypertrophy.   ASSESSMENT AND PLAN:  1.  Persistent atrial fibrillation: Hannah [name] has presented today for surgery, with the diagnosis of atrial fibrillation.  The various methods of treatment have been discussed with the patient and family. After consideration of risks, benefits and other options for treatment, the patient has consented to  Procedure(s): Catheter ablation as a surgical intervention .  Risks include but not limited to bleeding, vascular damage, tamponade, heart block, stroke, damage to surrounding organs, among others. The patient's history has been reviewed, patient examined, no change in status, stable for surgery.  I have reviewed the patient's chart and labs.  Questions were answered to the patient's satisfaction.    Kellin Bartling Curt Bears, MD 05/04/2020 10:56 AM

## 2020-05-04 NOTE — Anesthesia Preprocedure Evaluation (Addendum)
Anesthesia Evaluation  Patient identified by MRN, date of birth, ID band Patient awake    Reviewed: Allergy & Precautions, NPO status , Patient's Chart, lab work & pertinent test results  History of Anesthesia Complications Negative for: history of anesthetic complications  Airway Mallampati: III  TM Distance: >3 FB Neck ROM: Full    Dental no notable dental hx. (+) Dental Advisory Given, Poor Dentition, Chipped, Missing   Pulmonary neg pulmonary ROS,    Pulmonary exam normal        Cardiovascular hypertension, +CHF  + dysrhythmias Atrial Fibrillation  Rhythm:Irregular Rate:Normal  IMPRESSIONS    1. Left ventricular ejection fraction, by estimation, is 60 to 65%. The  left ventricle has normal function.  2. Right ventricular systolic function is normal. The right ventricular  size is mildly enlarged. There is moderately elevated pulmonary artery  systolic pressure.  3. Left atrial size was moderately dilated. No left atrial/left atrial  appendage thrombus was detected.  4. Right atrial size was mildly dilated.  5. The mitral valve is abnormal. Moderate mitral valve regurgitation. ERO  0.2 cm^2, RV 42 cc.  6. Tricuspid valve regurgitation is moderate.  7. The aortic valve is tricuspid. Aortic valve regurgitation is not  visualized. No aortic stenosis is present.    Neuro/Psych PSYCHIATRIC DISORDERS Anxiety PTSD (post-traumatic stress disorder)negative neurological ROS     GI/Hepatic negative GI ROS, Neg liver ROS,   Endo/Other  diabetes, Oral Hypoglycemic AgentsHypothyroidism Morbid obesity  Renal/GU Renal InsufficiencyRenal disease     Musculoskeletal negative musculoskeletal ROS (+)   Abdominal (+) + obese,   Peds  Hematology  (+) anemia , HLD   Anesthesia Other Findings atrial flutter  Reproductive/Obstetrics                           Anesthesia Physical  Anesthesia  Plan  ASA: III  Anesthesia Plan: General   Post-op Pain Management:    Induction: Intravenous  PONV Risk Score and Plan: 3 and Propofol infusion and Treatment may vary due to age or medical condition  Airway Management Planned: Mask  Additional Equipment:   Intra-op Plan:   Post-operative Plan:   Informed Consent: I have reviewed the patients History and Physical, chart, labs and discussed the procedure including the risks, benefits and alternatives for the proposed anesthesia with the patient or authorized representative who has indicated his/her understanding and acceptance.     Dental advisory given  Plan Discussed with: CRNA and Anesthesiologist  Anesthesia Plan Comments:        Anesthesia Quick Evaluation

## 2020-05-04 NOTE — Progress Notes (Signed)
     Patient complained about her R eye discomfort, and this was related to Dr Curt Bears. He had me call anesthesia, and Dr Linna Caprice came, and examined the patient R eye. Eye drops were prescribed to remedy the discomfort.

## 2020-05-04 NOTE — Anesthesia Postprocedure Evaluation (Signed)
Anesthesia Post Note  Patient: Hannah [name]  Procedure(s) Performed: TRANSESOPHAGEAL ECHOCARDIOGRAM (TEE) (N/A )     Patient location during evaluation: Endoscopy Anesthesia Type: General and MAC Level of consciousness: awake and alert Pain management: pain level controlled Vital Signs Assessment: post-procedure vital signs reviewed and stable Respiratory status: spontaneous breathing, nonlabored ventilation, respiratory function stable and patient connected to nasal cannula oxygen Cardiovascular status: blood pressure returned to baseline and stable Postop Assessment: no apparent nausea or vomiting Anesthetic complications: no   No complications documented.  Last Vitals:  Vitals:   05/03/20 1120 05/03/20 1130  BP: 117/60 132/66  Pulse: 70 61  Resp: (!) 21 (!) 22  Temp:    SpO2: 100% 100%    Last Pain:  Vitals:   05/03/20 1130  TempSrc:   PainSc: 0-No pain                 [name] COKER

## 2020-05-04 NOTE — Progress Notes (Signed)
Anesthesiology Note:  Hannah [name] 'Hannah [name] is a 74 year old female who underwent atrial flutter ablation ablation today by Dr. Curt Bears under general endotracheal anesthesia.  The usual eye protective measures were employed with eye taping.  In the recovery area she is complaining of irritation of her right eye.  Examination: Pupils equal and reactive.  Vision grossly normal.  Conjunctiva is not injected.  Impression: Possible mild corneal abrasion  Plan:  1. Irrigate right eye with balanced salt solution. t 2. Ketorolac eyedrops every 6h PRN.  3.  Follow-up in a.m. if symptoms persist or worsen, consider ophthalmology consult.  Roberts Gaudy

## 2020-05-04 NOTE — Anesthesia Procedure Notes (Signed)
Procedure Name: Intubation Date/Time: 05/04/2020 11:40 AM Performed by: Lowella Dell, CRNA Pre-anesthesia Checklist: Patient identified, Emergency Drugs available, Suction available and Patient being monitored Patient Re-evaluated:Patient Re-evaluated prior to induction Oxygen Delivery Method: Circle System Utilized Preoxygenation: Pre-oxygenation with 100% oxygen Induction Type: IV induction Ventilation: Mask ventilation without difficulty and Oral airway inserted - appropriate to patient size Laryngoscope Size: Mac and 3 Grade View: Grade I Tube type: Oral Tube size: 7.0 mm Number of attempts: 1 Airway Equipment and Method: Stylet (shoulder roll) Placement Confirmation: ETT inserted through vocal cords under direct vision,  positive ETCO2 and breath sounds checked- equal and bilateral Secured at: 22 cm Tube secured with: Tape Dental Injury: Teeth and Oropharynx as per pre-operative assessment

## 2020-05-07 ENCOUNTER — Encounter (HOSPITAL_COMMUNITY): Payer: Self-pay | Admitting: Cardiology

## 2020-05-25 DIAGNOSIS — I5032 Chronic diastolic (congestive) heart failure: Secondary | ICD-10-CM | POA: Diagnosis not present

## 2020-05-25 DIAGNOSIS — G4733 Obstructive sleep apnea (adult) (pediatric): Secondary | ICD-10-CM | POA: Diagnosis not present

## 2020-05-28 DIAGNOSIS — I129 Hypertensive chronic kidney disease with stage 1 through stage 4 chronic kidney disease, or unspecified chronic kidney disease: Secondary | ICD-10-CM | POA: Diagnosis not present

## 2020-05-28 DIAGNOSIS — M899 Disorder of bone, unspecified: Secondary | ICD-10-CM | POA: Diagnosis not present

## 2020-05-28 DIAGNOSIS — D631 Anemia in chronic kidney disease: Secondary | ICD-10-CM | POA: Diagnosis not present

## 2020-05-28 DIAGNOSIS — I5030 Unspecified diastolic (congestive) heart failure: Secondary | ICD-10-CM | POA: Diagnosis not present

## 2020-05-28 DIAGNOSIS — N1832 Chronic kidney disease, stage 3b: Secondary | ICD-10-CM | POA: Diagnosis not present

## 2020-05-30 DIAGNOSIS — E039 Hypothyroidism, unspecified: Secondary | ICD-10-CM | POA: Diagnosis not present

## 2020-05-30 DIAGNOSIS — M1710 Unilateral primary osteoarthritis, unspecified knee: Secondary | ICD-10-CM | POA: Diagnosis not present

## 2020-05-30 DIAGNOSIS — I482 Chronic atrial fibrillation, unspecified: Secondary | ICD-10-CM | POA: Diagnosis not present

## 2020-05-30 DIAGNOSIS — I4891 Unspecified atrial fibrillation: Secondary | ICD-10-CM | POA: Diagnosis not present

## 2020-05-30 DIAGNOSIS — I5032 Chronic diastolic (congestive) heart failure: Secondary | ICD-10-CM | POA: Diagnosis not present

## 2020-05-30 DIAGNOSIS — E7800 Pure hypercholesterolemia, unspecified: Secondary | ICD-10-CM | POA: Diagnosis not present

## 2020-05-30 DIAGNOSIS — N183 Chronic kidney disease, stage 3 unspecified: Secondary | ICD-10-CM | POA: Diagnosis not present

## 2020-05-30 DIAGNOSIS — E119 Type 2 diabetes mellitus without complications: Secondary | ICD-10-CM | POA: Diagnosis not present

## 2020-05-30 DIAGNOSIS — F321 Major depressive disorder, single episode, moderate: Secondary | ICD-10-CM | POA: Diagnosis not present

## 2020-05-31 ENCOUNTER — Other Ambulatory Visit: Payer: Self-pay | Admitting: Nephrology

## 2020-05-31 ENCOUNTER — Encounter (HOSPITAL_COMMUNITY): Payer: PPO | Admitting: Cardiology

## 2020-05-31 DIAGNOSIS — N1832 Chronic kidney disease, stage 3b: Secondary | ICD-10-CM

## 2020-06-04 ENCOUNTER — Other Ambulatory Visit: Payer: Self-pay

## 2020-06-04 ENCOUNTER — Ambulatory Visit (HOSPITAL_COMMUNITY)
Admission: RE | Admit: 2020-06-04 | Discharge: 2020-06-04 | Disposition: A | Discharge status: Home / Self Care | Payer: PPO | Source: Ambulatory Visit | Attending: Nurse Practitioner | Admitting: Nurse Practitioner

## 2020-06-04 VITALS — BP 142/68 | HR 77 | Ht 66.0 in

## 2020-06-04 DIAGNOSIS — I110 Hypertensive heart disease with heart failure: Secondary | ICD-10-CM | POA: Diagnosis not present

## 2020-06-04 DIAGNOSIS — I5032 Chronic diastolic (congestive) heart failure: Secondary | ICD-10-CM | POA: Insufficient documentation

## 2020-06-04 DIAGNOSIS — I4819 Other persistent atrial fibrillation: Secondary | ICD-10-CM | POA: Insufficient documentation

## 2020-06-04 DIAGNOSIS — Z7901 Long term (current) use of anticoagulants: Secondary | ICD-10-CM | POA: Insufficient documentation

## 2020-06-04 DIAGNOSIS — I4510 Unspecified right bundle-branch block: Secondary | ICD-10-CM | POA: Insufficient documentation

## 2020-06-04 DIAGNOSIS — D6869 Other thrombophilia: Secondary | ICD-10-CM | POA: Diagnosis not present

## 2020-06-04 LAB — COMPREHENSIVE METABOLIC PANEL
ALT: 18 U/L (ref 0–44)
AST: 18 U/L (ref 15–41)
Albumin: 3.6 g/dL (ref 3.5–5.0)
Alkaline Phosphatase: 53 U/L (ref 38–126)
Anion gap: 13 (ref 5–15)
BUN: 29 mg/dL — ABNORMAL HIGH (ref 8–23)
CO2: 32 mmol/L (ref 22–32)
Calcium: 9.3 mg/dL (ref 8.9–10.3)
Chloride: 94 mmol/L — ABNORMAL LOW (ref 98–111)
Creatinine, Ser: 1.7 mg/dL — ABNORMAL HIGH (ref 0.44–1.00)
GFR, Estimated: 31 mL/min — ABNORMAL LOW (ref >60)
Glucose, Bld: 108 mg/dL — ABNORMAL HIGH (ref 70–99)
Potassium: 3.5 mmol/L (ref 3.5–5.1)
Sodium: 139 mmol/L (ref 135–145)
Total Bilirubin: 1.2 mg/dL (ref 0.3–1.2)
Total Protein: 7.9 g/dL (ref 6.5–8.1)

## 2020-06-04 LAB — CBC
HCT: 33 % — ABNORMAL LOW (ref 36.0–46.0)
Hemoglobin: 9.9 g/dL — ABNORMAL LOW (ref 12.0–15.0)
MCH: 27.6 pg (ref 26.0–34.0)
MCHC: 30 g/dL (ref 30.0–36.0)
MCV: 91.9 fL (ref 80.0–100.0)
Platelets: 226 10*3/uL (ref 150–400)
RBC: 3.59 MIL/uL — ABNORMAL LOW (ref 3.87–5.11)
RDW: 15.9 % — ABNORMAL HIGH (ref 11.5–15.5)
WBC: 5.5 10*3/uL (ref 4.0–10.5)
nRBC: 0 % (ref 0.0–0.2)

## 2020-06-04 LAB — TSH: TSH: 5.705 u[IU]/mL — ABNORMAL HIGH (ref 0.350–4.500)

## 2020-06-04 MED ORDER — AMIODARONE HCL 200 MG PO TABS
ORAL_TABLET | ORAL | 1 refills | Status: DC
Start: 2020-06-04 — End: 2020-06-26

## 2020-06-04 NOTE — H&P (View-Only) (Signed)
Primary Care Physician: Lujean Amel, MD Referring Physician: Dr. Lennart Pall Hannah [name] is a 74 y.o. female with a h/o persistent afib, chronic diastolic HF, DM, that is in the afib clinic for f/u ablation one month ago. Unfortunately, she is in afib,rate controlled.she was unaware.  Discussed with pt that she would require cardioversion. She was very upset and crying over the news. I will increase amiodarone to 200 mg bid until time of cardioversion. She states no missed eliquis 5 mg bid. She  has had covid shots.  Today, she denies symptoms of palpitations, chest pain, shortness of breath, orthopnea, PND, lower extremity edema, dizziness, presyncope, syncope, or neurologic sequela. The patient is tolerating medications without difficulties and is otherwise without complaint today.   Past Medical History:  Diagnosis Date  . Atrial fibrillation ([place])   . Diabetes mellitus without complication ([place])   . Glaucoma    BILATERAL  . Hypertension   . Thyroid disease   . Volume overload 11/2019   Past Surgical History:  Procedure Laterality Date  . APPENDECTOMY    . ATRIAL FIBRILLATION ABLATION N/A 05/04/2020   Procedure: ATRIAL FIBRILLATION ABLATION;  Surgeon: Constance Haw, MD;  Location: [place] [facility];  Service: Cardiovascular;  Laterality: N/A;  . CARDIOVERSION  11/24/2019  . CARDIOVERSION N/A 11/24/2019   Procedure: CARDIOVERSION;  Surgeon: Larey Dresser, MD;  Location: [facility] ENDOSCOPY;  Service: Cardiovascular;  Laterality: N/A;  . CARDIOVERSION N/A 11/29/2019   Procedure: CARDIOVERSION;  Surgeon: Larey Dresser, MD;  Location: [facility] ENDOSCOPY;  Service: Cardiovascular;  Laterality: N/A;  . CHOLECYSTECTOMY    . HERNIA REPAIR     UMBILICAL  . LASIK     BILATERAL  . TEE WITHOUT CARDIOVERSION N/A 11/24/2019   Procedure: TRANSESOPHAGEAL ECHOCARDIOGRAM (TEE);  Surgeon: Larey Dresser, MD;  Location: [facility] ENDOSCOPY;  Service: Cardiovascular;  Laterality: N/A;  . TEE  WITHOUT CARDIOVERSION N/A 05/03/2020   Procedure: TRANSESOPHAGEAL ECHOCARDIOGRAM (TEE);  Surgeon: Donato Heinz, MD;  Location: [facility] ENDOSCOPY;  Service: Cardiovascular;  Laterality: N/A;    Current Outpatient Medications  Medication Sig Dispense Refill  . amiodarone (PACERONE) 200 MG tablet Take 1 tablet twice a day for 10 days, then reduce down to 1 tablet once daily (Patient taking differently: Take 200 mg by mouth daily. ) 90 tablet 1  . apixaban (ELIQUIS) 5 MG TABS tablet Take 1 tablet (5 mg total) by mouth 2 (two) times daily. 180 tablet 0  . busPIRone (BUSPAR) 15 MG tablet TAKE 1 TABLET THREE TIMES A DAY (Patient taking differently: Take 15 mg by mouth in the morning, at noon, and at bedtime. ) 270 tablet 1  . DULoxetine (CYMBALTA) 30 MG capsule Take 3 capsules (90 mg total) by mouth daily. 270 capsule 1  . esomeprazole (NEXIUM) 40 MG capsule Take 40 mg by mouth as needed (indigestion/heartburn).     Marland Kitchen levocetirizine (XYZAL) 5 MG tablet Take 5 mg by mouth at bedtime.     Marland Kitchen levothyroxine (SYNTHROID, LEVOTHROID) 75 MCG tablet Take 75 mcg by mouth daily before breakfast.     . metFORMIN (GLUCOPHAGE) 500 MG tablet Take 500 mg by mouth daily.    . metolazone (ZAROXOLYN) 2.5 MG tablet Take 1 tablet (2.5 mg total) by mouth once a week. On Fridays (Patient taking differently: Take 2.5 mg by mouth every Friday. ) 30 tablet 8  . Misc. Devices (BARIATRIC ROLLATOR) MISC For home use 1 each 0  . montelukast (SINGULAIR) 10 MG  tablet Take 10 mg by mouth at bedtime.     . Multiple Vitamins-Minerals (PRESERVISION AREDS 2 PO) Take 1 tablet by mouth in the morning and at bedtime.    Marland Kitchen NYAMYC powder Apply 1 application topically as needed (rash under breasts).     . Potassium Chloride ER 20 MEQ TBCR Take 20 mEq by mouth 2 (two) times daily. Take extra 20 meq on days she takes extra diuretic. 200 tablet 1  . rosuvastatin (CRESTOR) 5 MG tablet Take 5 mg by mouth daily.    Marland Kitchen spironolactone (ALDACTONE)  25 MG tablet TAKE 1/2 TABLET DAILY. (Patient taking differently: Take 12.5 mg by mouth daily. ) 15 tablet 9  . torsemide (DEMADEX) 20 MG tablet Take 3 tablets (60 mg total) every morning.  Take 2 tablets (40 mg total) every evening. (Patient taking differently: Take 40-60 mg by mouth See admin instructions. Take 3 tablets (60 mg total) every morning & take 2 tablets (40 mg total) every evening.) 150 tablet 3  . traZODone (DESYREL) 100 MG tablet Take 2 tablets (200 mg total) by mouth at bedtime. 180 tablet 1  . triamcinolone cream (KENALOG) 0.1 % Apply 1 application topically daily. Applied to legs     No current facility-administered medications for this encounter.    Allergies  Allergen Reactions  . Penicillins Other (See Comments)    Possibility; father and brother allergic     Social History   Socioeconomic History  . Marital status: Legally Separated    Spouse name: Not on file  . Number of children: Not on file  . Years of education: Not on file  . Highest education level: Not on file  Occupational History  . Not on file  Tobacco Use  . Smoking status: Never Smoker  . Smokeless tobacco: Never Used  Vaping Use  . Vaping Use: Never used  Substance and Sexual Activity  . Alcohol use: Yes    Alcohol/week: 1.0 standard drink    Types: 1 Glasses of wine per week    Comment: occ  . Drug use: No  . Sexual activity: Not Currently  Other Topics Concern  . Not on file  Social History Narrative  . Not on file   Social Determinants of Health   Financial Resource Strain:   . Difficulty of Paying Living Expenses: Not on file  Food Insecurity:   . Worried About Charity fundraiser in the Last Year: Not on file  . Ran Out of Food in the Last Year: Not on file  Transportation Needs:   . Lack of Transportation (Medical): Not on file  . Lack of Transportation (Non-Medical): Not on file  Physical Activity:   . Days of Exercise per Week: Not on file  . Minutes of Exercise per  Session: Not on file  Stress:   . Feeling of Stress : Not on file  Social Connections:   . Frequency of Communication with Friends and Family: Not on file  . Frequency of Social Gatherings with Friends and Family: Not on file  . Attends Religious Services: Not on file  . Active Member of Clubs or Organizations: Not on file  . Attends Archivist Meetings: Not on file  . Marital Status: Not on file  Intimate Partner Violence:   . Fear of Current or Ex-Partner: Not on file  . Emotionally Abused: Not on file  . Physically Abused: Not on file  . Sexually Abused: Not on file    Family History  Problem Relation Age of Onset  . Lung cancer Mother   . Colon cancer Father   . Heart attack Brother   . Heart disease Brother   . Lung cancer Maternal Grandfather   . Heart attack Paternal Grandfather     ROS- All systems are reviewed and negative except as per the HPI above  Physical Exam: Vitals:   06/04/20 1509  BP: (!) 142/68  Pulse: 77  Height: 5\' 6"  (1.676 m)   Wt Readings from Last 3 Encounters:  05/04/20 134.7 kg  05/03/20 134.7 kg  03/20/20 121.6 kg    Labs: Lab Results  Component Value Date   NA 139 05/02/2020   K 4.0 05/02/2020   CL 96 05/02/2020   CO2 31 (H) 05/02/2020   GLUCOSE 103 (H) 05/02/2020   BUN 40 (H) 05/02/2020   CREATININE 1.56 (H) 05/02/2020   CALCIUM 9.5 05/02/2020   MG 2.2 11/26/2019   Lab Results  Component Value Date   INR 2.9 03/20/2020   No results found for: CHOL, HDL, LDLCALC, TRIG   GEN- The patient is well appearing, alert and oriented x 3 today.   Head- normocephalic, atraumatic Eyes-  Sclera clear, conjunctiva pink Ears- hearing intact Oropharynx- clear Neck- supple, no JVP Lymph- no cervical lymphadenopathy Lungs- Clear to ausculation bilaterally, normal work of breathing Heart- irregular rate and rhythm, no murmurs, rubs or gallops, PMI not laterally displaced GI- soft, NT, ND, + BS Extremities- no clubbing,  cyanosis, or edema MS- no significant deformity or atrophy Skin- no rash or lesion Psych- euthymic mood, full affect Neuro- strength and sensation are intact  EKG-afib with RBBB, at 77 bpm, qrs int 156 ms, qtc 457 ms     Assessment and Plan: 1. Persistent  afib  S/p ablation one month ago and unfortunately is in Afib rate controlled, she was asymptomatic  I discussed cardioversion with her and she was upset about having to go thru it again but did agree to pursue I will increase amiodarone to 200 mg bid until cardioversion    2. CHA2DS2VAScore at least 4 Continue  eliquis 5 mg bid  States no missed doses for at least 3 weeks   3. Diastolic heart failure  Appears to be normovolemic Continue  torsemide, HCTZ, Zaroylan as scheduled     F/u in afib clinic one week after cardioversion  Butch Penny C. Lafonda Patron, [facility] [address] [place], [place] [address] [phone]

## 2020-06-04 NOTE — Progress Notes (Signed)
Primary Care Physician: Hannah Amel, MD Referring Physician: Dr. Lennart Pall Hannah [name] is a 74 y.o. female with a h/o persistent afib, chronic diastolic HF, DM, that is in the afib clinic for f/u ablation one month ago. Unfortunately, she is in afib,rate controlled.she was unaware.  Discussed with pt that she would require cardioversion. She was very upset and crying over the news. I will increase amiodarone to 200 mg bid until time of cardioversion. She states no missed eliquis 5 mg bid. She  has had covid shots.  Today, she denies symptoms of palpitations, chest pain, shortness of breath, orthopnea, PND, lower extremity edema, dizziness, presyncope, syncope, or neurologic sequela. The patient is tolerating medications without difficulties and is otherwise without complaint today.   Past Medical History:  Diagnosis Date  . Atrial fibrillation ([name])   . Diabetes mellitus without complication ([name])   . Glaucoma    BILATERAL  . Hypertension   . Thyroid disease   . Volume overload 11/2019   Past Surgical History:  Procedure Laterality Date  . APPENDECTOMY    . ATRIAL FIBRILLATION ABLATION N/A 05/04/2020   Procedure: ATRIAL FIBRILLATION ABLATION;  Surgeon: Constance Haw, MD;  Location: [place] [facility];  Service: Cardiovascular;  Laterality: N/A;  . CARDIOVERSION  11/24/2019  . CARDIOVERSION N/A 11/24/2019   Procedure: CARDIOVERSION;  Surgeon: Larey Dresser, MD;  Location: [facility] ENDOSCOPY;  Service: Cardiovascular;  Laterality: N/A;  . CARDIOVERSION N/A 11/29/2019   Procedure: CARDIOVERSION;  Surgeon: Larey Dresser, MD;  Location: [facility] ENDOSCOPY;  Service: Cardiovascular;  Laterality: N/A;  . CHOLECYSTECTOMY    . HERNIA REPAIR     UMBILICAL  . LASIK     BILATERAL  . TEE WITHOUT CARDIOVERSION N/A 11/24/2019   Procedure: TRANSESOPHAGEAL ECHOCARDIOGRAM (TEE);  Surgeon: Larey Dresser, MD;  Location: [facility] ENDOSCOPY;  Service: Cardiovascular;  Laterality: N/A;  . TEE  WITHOUT CARDIOVERSION N/A 05/03/2020   Procedure: TRANSESOPHAGEAL ECHOCARDIOGRAM (TEE);  Surgeon: Donato Heinz, MD;  Location: [facility] ENDOSCOPY;  Service: Cardiovascular;  Laterality: N/A;    Current Outpatient Medications  Medication Sig Dispense Refill  . amiodarone (PACERONE) 200 MG tablet Take 1 tablet twice a day for 10 days, then reduce down to 1 tablet once daily (Patient taking differently: Take 200 mg by mouth daily. ) 90 tablet 1  . apixaban (ELIQUIS) 5 MG TABS tablet Take 1 tablet (5 mg total) by mouth 2 (two) times daily. 180 tablet 0  . busPIRone (BUSPAR) 15 MG tablet TAKE 1 TABLET THREE TIMES A DAY (Patient taking differently: Take 15 mg by mouth in the morning, at noon, and at bedtime. ) 270 tablet 1  . DULoxetine (CYMBALTA) 30 MG capsule Take 3 capsules (90 mg total) by mouth daily. 270 capsule 1  . esomeprazole (NEXIUM) 40 MG capsule Take 40 mg by mouth as needed (indigestion/heartburn).     Marland Kitchen levocetirizine (XYZAL) 5 MG tablet Take 5 mg by mouth at bedtime.     Marland Kitchen levothyroxine (SYNTHROID, LEVOTHROID) 75 MCG tablet Take 75 mcg by mouth daily before breakfast.     . metFORMIN (GLUCOPHAGE) 500 MG tablet Take 500 mg by mouth daily.    . metolazone (ZAROXOLYN) 2.5 MG tablet Take 1 tablet (2.5 mg total) by mouth once a week. On Fridays (Patient taking differently: Take 2.5 mg by mouth every Friday. ) 30 tablet 8  . Misc. Devices (BARIATRIC ROLLATOR) MISC For home use 1 each 0  . montelukast (SINGULAIR) 10 MG  tablet Take 10 mg by mouth at bedtime.     . Multiple Vitamins-Minerals (PRESERVISION AREDS 2 PO) Take 1 tablet by mouth in the morning and at bedtime.    Marland Kitchen NYAMYC powder Apply 1 application topically as needed (rash under breasts).     . Potassium Chloride ER 20 MEQ TBCR Take 20 mEq by mouth 2 (two) times daily. Take extra 20 meq on days she takes extra diuretic. 200 tablet 1  . rosuvastatin (CRESTOR) 5 MG tablet Take 5 mg by mouth daily.    Marland Kitchen spironolactone (ALDACTONE)  25 MG tablet TAKE 1/2 TABLET DAILY. (Patient taking differently: Take 12.5 mg by mouth daily. ) 15 tablet 9  . torsemide (DEMADEX) 20 MG tablet Take 3 tablets (60 mg total) every morning.  Take 2 tablets (40 mg total) every evening. (Patient taking differently: Take 40-60 mg by mouth See admin instructions. Take 3 tablets (60 mg total) every morning & take 2 tablets (40 mg total) every evening.) 150 tablet 3  . traZODone (DESYREL) 100 MG tablet Take 2 tablets (200 mg total) by mouth at bedtime. 180 tablet 1  . triamcinolone cream (KENALOG) 0.1 % Apply 1 application topically daily. Applied to legs     No current facility-administered medications for this encounter.    Allergies  Allergen Reactions  . Penicillins Other (See Comments)    Possibility; father and brother allergic     Social History   Socioeconomic History  . Marital status: Legally Separated    Spouse name: Not on file  . Number of children: Not on file  . Years of education: Not on file  . Highest education level: Not on file  Occupational History  . Not on file  Tobacco Use  . Smoking status: Never Smoker  . Smokeless tobacco: Never Used  Vaping Use  . Vaping Use: Never used  Substance and Sexual Activity  . Alcohol use: Yes    Alcohol/week: 1.0 standard drink    Types: 1 Glasses of wine per week    Comment: occ  . Drug use: No  . Sexual activity: Not Currently  Other Topics Concern  . Not on file  Social History Narrative  . Not on file   Social Determinants of Health   Financial Resource Strain:   . Difficulty of Paying Living Expenses: Not on file  Food Insecurity:   . Worried About Charity fundraiser in the Last Year: Not on file  . Ran Out of Food in the Last Year: Not on file  Transportation Needs:   . Lack of Transportation (Medical): Not on file  . Lack of Transportation (Non-Medical): Not on file  Physical Activity:   . Days of Exercise per Week: Not on file  . Minutes of Exercise per  Session: Not on file  Stress:   . Feeling of Stress : Not on file  Social Connections:   . Frequency of Communication with Friends and Family: Not on file  . Frequency of Social Gatherings with Friends and Family: Not on file  . Attends Religious Services: Not on file  . Active Member of Clubs or Organizations: Not on file  . Attends Archivist Meetings: Not on file  . Marital Status: Not on file  Intimate Partner Violence:   . Fear of Current or Ex-Partner: Not on file  . Emotionally Abused: Not on file  . Physically Abused: Not on file  . Sexually Abused: Not on file    Family History  Problem Relation Age of Onset  . Lung cancer Mother   . Colon cancer Father   . Heart attack Brother   . Heart disease Brother   . Lung cancer Maternal Grandfather   . Heart attack Paternal Grandfather     ROS- All systems are reviewed and negative except as per the HPI above  Physical Exam: Vitals:   06/04/20 1509  BP: (!) 142/68  Pulse: 77  Height: 5\' 6"  (1.676 m)   Wt Readings from Last 3 Encounters:  05/04/20 134.7 kg  05/03/20 134.7 kg  03/20/20 121.6 kg    Labs: Lab Results  Component Value Date   NA 139 05/02/2020   K 4.0 05/02/2020   CL 96 05/02/2020   CO2 31 (H) 05/02/2020   GLUCOSE 103 (H) 05/02/2020   BUN 40 (H) 05/02/2020   CREATININE 1.56 (H) 05/02/2020   CALCIUM 9.5 05/02/2020   MG 2.2 11/26/2019   Lab Results  Component Value Date   INR 2.9 03/20/2020   No results found for: CHOL, HDL, LDLCALC, TRIG   GEN- The patient is well appearing, alert and oriented x 3 today.   Head- normocephalic, atraumatic Eyes-  Sclera clear, conjunctiva pink Ears- hearing intact Oropharynx- clear Neck- supple, no JVP Lymph- no cervical lymphadenopathy Lungs- Clear to ausculation bilaterally, normal work of breathing Heart- irregular rate and rhythm, no murmurs, rubs or gallops, PMI not laterally displaced GI- soft, NT, ND, + BS Extremities- no clubbing,  cyanosis, or edema MS- no significant deformity or atrophy Skin- no rash or lesion Psych- euthymic mood, full affect Neuro- strength and sensation are intact  EKG-afib with RBBB, at 77 bpm, qrs int 156 ms, qtc 457 ms     Assessment and Plan: 1. Persistent  afib  S/p ablation one month ago and unfortunately is in Afib rate controlled, she was asymptomatic  I discussed cardioversion with her and she was upset about having to go thru it again but did agree to pursue I will increase amiodarone to 200 mg bid until cardioversion    2. CHA2DS2VAScore at least 4 Continue  eliquis 5 mg bid  States no missed doses for at least 3 weeks   3. Diastolic heart failure  Appears to be normovolemic Continue  torsemide, HCTZ, Zaroylan as scheduled     F/u in afib clinic one week after cardioversion  Butch Penny C. Marli Diego, [facility] [address] [place], [place] [address] [phone]

## 2020-06-04 NOTE — Patient Instructions (Signed)
Increase amiodarone to 200mg  twice a day until December 7th then reduce back to amiodarone 200mg  once a day  Cardioversion scheduled for Tuesday, December 7th  - Arrive at the [facility] and go to admitting at 1130AM  - Do not eat or drink anything after midnight the night prior to your procedure.  - Take all your morning medication (except diabetic medications) with a sip of water prior to arrival.  - You will not be able to drive home after your procedure.  - Do NOT miss any doses of your blood thinner - if you should miss a dose please notify our office immediately.  - If you feel as if you go back into normal rhythm prior to scheduled cardioversion, please notify our office immediately. If your procedure is canceled in the cardioversion suite you will be charged a cancellation fee.   Social work will call you regarding transportation to covid testing and procedure.

## 2020-06-11 ENCOUNTER — Telehealth (HOSPITAL_COMMUNITY): Payer: Self-pay | Admitting: Licensed Clinical Social Worker

## 2020-06-11 NOTE — Telephone Encounter (Signed)
CSW consulted to help with transportation for COVID test on 12/3 and procedure on 12/7.  CSW called and spoke to pt daughter and confirmed transport details.  Requests sent in for both appts to [facility].  CSW will continue to follow and assist as needed  Jorge Ny, [facility] Desk#: [phone] Cell#: [phone]

## 2020-06-15 ENCOUNTER — Other Ambulatory Visit (HOSPITAL_COMMUNITY)
Admission: RE | Admit: 2020-06-15 | Discharge: 2020-06-15 | Disposition: A | Discharge status: Home / Self Care | Payer: PPO | Source: Ambulatory Visit | Attending: Cardiology | Admitting: Cardiology

## 2020-06-15 DIAGNOSIS — Z01812 Encounter for preprocedural laboratory examination: Secondary | ICD-10-CM | POA: Insufficient documentation

## 2020-06-15 DIAGNOSIS — Z20822 Contact with and (suspected) exposure to covid-19: Secondary | ICD-10-CM | POA: Diagnosis not present

## 2020-06-15 LAB — SARS CORONAVIRUS 2 (TAT 6-24 HRS): SARS Coronavirus 2: NEGATIVE

## 2020-06-19 ENCOUNTER — Ambulatory Visit (HOSPITAL_COMMUNITY): Payer: PPO | Admitting: Certified Registered Nurse Anesthetist

## 2020-06-19 ENCOUNTER — Encounter (HOSPITAL_COMMUNITY): Payer: Self-pay | Admitting: Cardiology

## 2020-06-19 ENCOUNTER — Ambulatory Visit (HOSPITAL_COMMUNITY)
Admission: RE | Admit: 2020-06-19 | Discharge: 2020-06-19 | Disposition: A | Discharge status: Home / Self Care | Payer: PPO | Attending: Cardiology | Admitting: Cardiology

## 2020-06-19 ENCOUNTER — Encounter (HOSPITAL_COMMUNITY)
Admission: RE | Disposition: A | Discharge status: Home / Self Care | Payer: Self-pay | Source: Home / Self Care | Attending: Cardiology

## 2020-06-19 DIAGNOSIS — E119 Type 2 diabetes mellitus without complications: Secondary | ICD-10-CM | POA: Diagnosis not present

## 2020-06-19 DIAGNOSIS — I4819 Other persistent atrial fibrillation: Secondary | ICD-10-CM | POA: Insufficient documentation

## 2020-06-19 DIAGNOSIS — I5032 Chronic diastolic (congestive) heart failure: Secondary | ICD-10-CM | POA: Diagnosis not present

## 2020-06-19 DIAGNOSIS — I4891 Unspecified atrial fibrillation: Secondary | ICD-10-CM | POA: Diagnosis not present

## 2020-06-19 DIAGNOSIS — Z79890 Hormone replacement therapy (postmenopausal): Secondary | ICD-10-CM | POA: Insufficient documentation

## 2020-06-19 DIAGNOSIS — Z79899 Other long term (current) drug therapy: Secondary | ICD-10-CM | POA: Insufficient documentation

## 2020-06-19 DIAGNOSIS — Z7901 Long term (current) use of anticoagulants: Secondary | ICD-10-CM | POA: Insufficient documentation

## 2020-06-19 DIAGNOSIS — Z880 Allergy status to penicillin: Secondary | ICD-10-CM | POA: Insufficient documentation

## 2020-06-19 DIAGNOSIS — I480 Paroxysmal atrial fibrillation: Secondary | ICD-10-CM | POA: Diagnosis not present

## 2020-06-19 DIAGNOSIS — I110 Hypertensive heart disease with heart failure: Secondary | ICD-10-CM | POA: Diagnosis not present

## 2020-06-19 DIAGNOSIS — I5033 Acute on chronic diastolic (congestive) heart failure: Secondary | ICD-10-CM | POA: Diagnosis not present

## 2020-06-19 DIAGNOSIS — Z7984 Long term (current) use of oral hypoglycemic drugs: Secondary | ICD-10-CM | POA: Diagnosis not present

## 2020-06-19 HISTORY — PX: CARDIOVERSION: SHX1299

## 2020-06-19 SURGERY — CARDIOVERSION
Anesthesia: General

## 2020-06-19 MED ORDER — LIDOCAINE 2% (20 MG/ML) 5 ML SYRINGE
INTRAMUSCULAR | Status: DC | PRN
  Administered 2020-06-19: 60 mg via INTRAVENOUS

## 2020-06-19 MED ORDER — SODIUM CHLORIDE 0.9 % IV SOLN: INTRAVENOUS | Status: DC | PRN

## 2020-06-19 MED ORDER — PROPOFOL 10 MG/ML IV BOLUS
INTRAVENOUS | Status: DC | PRN
  Administered 2020-06-19: 70 mg via INTRAVENOUS

## 2020-06-19 NOTE — Anesthesia Postprocedure Evaluation (Signed)
Anesthesia Post Note  Patient: Hannah [name]  Procedure(s) Performed: CARDIOVERSION (N/A )     Patient location during evaluation: Endoscopy Anesthesia Type: General Level of consciousness: awake and alert Pain management: pain level controlled Vital Signs Assessment: post-procedure vital signs reviewed and stable Respiratory status: spontaneous breathing, nonlabored ventilation and respiratory function stable Cardiovascular status: blood pressure returned to baseline and stable Postop Assessment: no apparent nausea or vomiting Anesthetic complications: no   No complications documented.  Last Vitals:  Vitals:   06/19/20 1232 06/19/20 1242  BP: (!) 148/90 (!) 134/58  Pulse: 68 67  Resp: (!) 22 15  Temp:    SpO2: 99% 100%    Last Pain:  Vitals:   06/19/20 1242  TempSrc:   PainSc: 0-No pain                 Audry Pili

## 2020-06-19 NOTE — CV Procedure (Signed)
    Cardioversion Note  Hannah [name] [id number] [birth year]  Procedure: DC Cardioversion Indications: atrial fibrillation  Procedure Details Consent: Obtained Time Out: Verified patient identification, verified procedure, site/side was marked, verified correct patient position, special equipment/implants available, Radiology Safety Procedures followed,  medications/allergies/relevent history reviewed, required imaging and test results available.  Performed  The patient has been on adequate anticoagulation.  The patient received IV Propofol 70 mg and IV Lidocaine 60 mg administered by anesthesia staff for deep sedation.  Synchronous cardioversion was performed at 200 joules.  The cardioversion was successful.   Complications: No apparent complications Patient did tolerate procedure well.   Hannah Dawley, MD, [facility] 06/19/2020, 12:21 PM

## 2020-06-19 NOTE — Discharge Instructions (Signed)
Electrical Cardioversion Electrical cardioversion is the delivery of a jolt of electricity to restore a normal rhythm to the heart. A rhythm that is too fast or is not regular keeps the heart from pumping well. In this procedure, sticky patches or metal paddles are placed on the chest to deliver electricity to the heart from a device. This procedure may be done in an emergency if:  There is low or no blood pressure as a result of the heart rhythm.  Normal rhythm must be restored as fast as possible to protect the brain and heart from further damage.  It may save a life. This may also be a scheduled procedure for irregular or fast heart rhythms that are not immediately life-threatening. Tell a health care provider about:  Any allergies you have.  All medicines you are taking, including vitamins, herbs, eye drops, creams, and over-the-counter medicines.  Any problems you or family members have had with anesthetic medicines.  Any blood disorders you have.  Any surgeries you have had.  Any medical conditions you have.  Whether you are pregnant or may be pregnant. What are the risks? Generally, this is a safe procedure. However, problems may occur, including:  Allergic reactions to medicines.  A blood clot that breaks free and travels to other parts of your body.  The possible return of an abnormal heart rhythm within hours or days after the procedure.  Your heart stopping (cardiac arrest). This is rare. What happens before the procedure? Medicines  Your health care provider may have you start taking: ? Blood-thinning medicines (anticoagulants) so your blood does not clot as easily. ? Medicines to help stabilize your heart rate and rhythm.  Ask your health care provider about: ? Changing or stopping your regular medicines. This is especially important if you are taking diabetes medicines or blood thinners. ? Taking medicines such as aspirin and ibuprofen. These medicines can  thin your blood. Do not take these medicines unless your health care provider tells you to take them. ? Taking over-the-counter medicines, vitamins, herbs, and supplements. General instructions  Follow instructions from your health care provider about eating or drinking restrictions.  Plan to have someone take you home from the hospital or clinic.  If you will be going home right after the procedure, plan to have someone with you for 24 hours.  Ask your health care provider what steps will be taken to help prevent infection. These may include washing your skin with a germ-killing soap. What happens during the procedure?   An IV will be inserted into one of your veins.  Sticky patches (electrodes) or metal paddles may be placed on your chest.  You will be given a medicine to help you relax (sedative).  An electrical shock will be delivered. The procedure may vary among health care providers and hospitals. What can I expect after the procedure?  Your blood pressure, heart rate, breathing rate, and blood oxygen level will be monitored until you leave the hospital or clinic.  Your heart rhythm will be watched to make sure it does not change.  You may have some redness on the skin where the shocks were given. Follow these instructions at home:  Do not drive for 24 hours if you were given a sedative during your procedure.  Take over-the-counter and prescription medicines only as told by your health care provider.  Ask your health care provider how to check your pulse. Check it often.  Rest for 48 hours after the procedure or   as told by your health care provider.  Avoid or limit your caffeine use as told by your health care provider.  Keep all follow-up visits as told by your health care provider. This is important. Contact a health care provider if:  You feel like your heart is beating too quickly or your pulse is not regular.  You have a serious muscle cramp that does not go  away. Get help right away if:  You have discomfort in your chest.  You are dizzy or you feel faint.  You have trouble breathing or you are short of breath.  Your speech is slurred.  You have trouble moving an arm or leg on one side of your body.  Your fingers or toes turn cold or blue. Summary  Electrical cardioversion is the delivery of a jolt of electricity to restore a normal rhythm to the heart.  This procedure may be done right away in an emergency or may be a scheduled procedure if the condition is not an emergency.  Generally, this is a safe procedure.  After the procedure, check your pulse often as told by your health care provider. This information is not intended to replace advice given to you by your health care provider. Make sure you discuss any questions you have with your health care provider. Document Revised: 01/31/2019 Document Reviewed: 01/31/2019 Elsevier Patient Education  2020 [employer]  

## 2020-06-19 NOTE — Interval H&P Note (Signed)
History and Physical Interval Note:  06/19/2020 11:25 AM  Hannah [name]  has presented today for surgery, with the diagnosis of AFIB.  The various methods of treatment have been discussed with the patient and family. After consideration of risks, benefits and other options for treatment, the patient has consented to  Procedure(s): CARDIOVERSION (N/A) as a surgical intervention.  The patient's history has been reviewed, patient examined, no change in status, stable for surgery.  I have reviewed the patient's chart and labs.  Questions were answered to the patient's satisfaction.     Ena Dawley

## 2020-06-19 NOTE — Transfer of Care (Signed)
Immediate Anesthesia Transfer of Care Note  Patient: Hannah [name]  Procedure(s) Performed: CARDIOVERSION (N/A )  Patient Location: Endoscopy Unit  Anesthesia Type:General  Level of Consciousness: drowsy and patient cooperative  Airway & Oxygen Therapy: Patient Spontanous Breathing  Post-op Assessment: Report given to RN and Post -op Vital signs reviewed and stable  Post vital signs: Reviewed and stable  Last Vitals:  Vitals Value Taken Time  BP 130/73   Temp    Pulse 65   Resp 12   SpO2 100%     Last Pain:  Vitals:   06/19/20 1149  TempSrc: Oral  PainSc: 0-No pain         Complications: No complications documented.

## 2020-06-20 ENCOUNTER — Encounter (HOSPITAL_COMMUNITY): Payer: Self-pay | Admitting: Cardiology

## 2020-06-20 NOTE — Anesthesia Preprocedure Evaluation (Addendum)
Anesthesia Evaluation  Patient identified by MRN, date of birth, ID band Patient awake    Reviewed: Patient's Chart, lab work & pertinent test results  Airway Mallampati: II  TM Distance: >3 FB Neck ROM: Full    Dental  (+) Teeth Intact   Pulmonary    Pulmonary exam normal        Cardiovascular hypertension, + dysrhythmias Atrial Fibrillation  Rhythm:Irregular Rate:Normal     Neuro/Psych Anxiety negative neurological ROS     GI/Hepatic Neg liver ROS, GERD  Medicated and Controlled,  Endo/Other  diabetes, Well Controlled, Type 2, Oral Hypoglycemic AgentsHypothyroidism   Renal/GU negative Renal ROS  negative genitourinary   Musculoskeletal negative musculoskeletal ROS (+)   Abdominal (+)  Abdomen: soft. Bowel sounds: normal.  Peds  Hematology negative hematology ROS (+)   Anesthesia Other Findings   Reproductive/Obstetrics negative OB ROS                             Anesthesia Physical Anesthesia Plan  ASA: III  Anesthesia Plan: General   Post-op Pain Management:    Induction: Intravenous  PONV Risk Score and Plan: 3 and Treatment may vary due to age or medical condition  Airway Management Planned: Mask  Additional Equipment: None  Intra-op Plan:   Post-operative Plan:   Informed Consent: I have reviewed the patients History and Physical, chart, labs and discussed the procedure including the risks, benefits and alternatives for the proposed anesthesia with the patient or authorized representative who has indicated his/her understanding and acceptance.     Dental advisory given  Plan Discussed with: CRNA  Anesthesia Plan Comments: (Lab Results      Component                Value               Date                      WBC                      5.5                 06/04/2020                HGB                      9.9 (L)             06/04/2020                HCT                       33.0 (L)            06/04/2020                MCV                      91.9                06/04/2020                PLT                      226  06/04/2020          )        Anesthesia Quick Evaluation

## 2020-06-24 DIAGNOSIS — G4733 Obstructive sleep apnea (adult) (pediatric): Secondary | ICD-10-CM | POA: Diagnosis not present

## 2020-06-24 DIAGNOSIS — I5032 Chronic diastolic (congestive) heart failure: Secondary | ICD-10-CM | POA: Diagnosis not present

## 2020-06-25 DIAGNOSIS — G4733 Obstructive sleep apnea (adult) (pediatric): Secondary | ICD-10-CM | POA: Diagnosis not present

## 2020-06-26 ENCOUNTER — Other Ambulatory Visit: Payer: Self-pay

## 2020-06-26 ENCOUNTER — Encounter (HOSPITAL_COMMUNITY): Payer: Self-pay | Admitting: Nurse Practitioner

## 2020-06-26 ENCOUNTER — Ambulatory Visit (HOSPITAL_COMMUNITY)
Admission: RE | Admit: 2020-06-26 | Discharge: 2020-06-26 | Disposition: A | Discharge status: Home / Self Care | Payer: PPO | Source: Ambulatory Visit | Attending: Nurse Practitioner | Admitting: Nurse Practitioner

## 2020-06-26 VITALS — BP 150/64 | HR 83 | Ht 66.0 in

## 2020-06-26 DIAGNOSIS — I110 Hypertensive heart disease with heart failure: Secondary | ICD-10-CM | POA: Diagnosis not present

## 2020-06-26 DIAGNOSIS — Z79899 Other long term (current) drug therapy: Secondary | ICD-10-CM | POA: Insufficient documentation

## 2020-06-26 DIAGNOSIS — I5032 Chronic diastolic (congestive) heart failure: Secondary | ICD-10-CM | POA: Insufficient documentation

## 2020-06-26 DIAGNOSIS — Z87891 Personal history of nicotine dependence: Secondary | ICD-10-CM | POA: Insufficient documentation

## 2020-06-26 DIAGNOSIS — D6869 Other thrombophilia: Secondary | ICD-10-CM | POA: Diagnosis not present

## 2020-06-26 DIAGNOSIS — I4819 Other persistent atrial fibrillation: Secondary | ICD-10-CM | POA: Insufficient documentation

## 2020-06-26 DIAGNOSIS — Z7984 Long term (current) use of oral hypoglycemic drugs: Secondary | ICD-10-CM | POA: Insufficient documentation

## 2020-06-26 DIAGNOSIS — Z7901 Long term (current) use of anticoagulants: Secondary | ICD-10-CM | POA: Insufficient documentation

## 2020-06-26 MED ORDER — AMIODARONE HCL 200 MG PO TABS: 200.0000 mg | ORAL_TABLET | Freq: Every day | ORAL | 1 refills | Status: DC

## 2020-06-26 NOTE — Addendum Note (Signed)
Encounter addended by: Louann Liv, LCSW on: 06/26/2020 3:31 PM  Actions taken: Clinical Note Signed

## 2020-06-26 NOTE — Progress Notes (Signed)
CSW referred to assist patient with financial resources for healthcare costs. Patient reports she is divorced and receives SSA retirement from ex husband. Her check amount is below the SSI limit and possibly will qualify for SSI benefits to supplement her income and provide medicaid as an additional insurance. Patient will contact SSA and make a phone appointment as she does not have Internet access. Patient also asked about transportation benefits and was provided with [facility] number as she has been referred in the past. Patient grateful for the support and resources provided. CSW available as needed. [name], [place], [place]

## 2020-06-26 NOTE — Patient Instructions (Signed)
Decrease Amiodarone to 200mg  once a day  Dr. Curt Bears office will call to move up your appointment with him.

## 2020-06-26 NOTE — Progress Notes (Signed)
Primary Care Physician: Lujean Amel, MD Referring Physician: Dr. Curt Bears  AHF- Dr. Yvonne Kendall [name] is a 74 y.o. female with a h/o persistent afib, chronic diastolic HF, DM, that is in the afib clinic for f/u ablation one month ago. Unfortunately, she is in afib,rate controlled. She was unaware.  Discussed with pt that she would require cardioversion. She was very upset and crying over the news. I will increase amiodarone to 200 mg bid until time of cardioversion. She states no missed eliquis 5 mg bid. She  has had covid shots.  Return to afib clinic, 06/26/20. She did have successful cardioversion but unfortunately, she is in afib today, rate controlled. She will lower Amiodarone back to 200 mg daily. I spoke to Dr. Shonna Chock and he suggested that I could offer her another cardioversion which the pt is not willingly to do at this point. He felt that her chances of returning to SR were lower than optimal going into procedure. She is also having some financial  concerns and I asked Kennyth Lose, SW with [facility] to speak to her and her daughter today.   Today, she denies symptoms of palpitations, chest pain, shortness of breath, orthopnea, PND, lower extremity edema, dizziness, presyncope, syncope, or neurologic sequela. The patient is tolerating medications without difficulties and is otherwise without complaint today.   Past Medical History:  Diagnosis Date  . Atrial fibrillation ([place])   . Diabetes mellitus without complication ([place])   . Glaucoma    BILATERAL  . Hypertension   . Thyroid disease   . Volume overload 11/2019   Past Surgical History:  Procedure Laterality Date  . APPENDECTOMY    . ATRIAL FIBRILLATION ABLATION N/A 05/04/2020   Procedure: ATRIAL FIBRILLATION ABLATION;  Surgeon: Constance Haw, MD;  Location: [place] [facility] LAB;  Service: Cardiovascular;  Laterality: N/A;  . CARDIOVERSION  11/24/2019  . CARDIOVERSION N/A 11/24/2019   Procedure: CARDIOVERSION;   Surgeon: Larey Dresser, MD;  Location: [facility] ENDOSCOPY;  Service: Cardiovascular;  Laterality: N/A;  . CARDIOVERSION N/A 11/29/2019   Procedure: CARDIOVERSION;  Surgeon: Larey Dresser, MD;  Location: [facility] ENDOSCOPY;  Service: Cardiovascular;  Laterality: N/A;  . CARDIOVERSION N/A 06/19/2020   Procedure: CARDIOVERSION;  Surgeon: Dorothy Spark, MD;  Location: [place];  Service: Cardiovascular;  Laterality: N/A;  . CHOLECYSTECTOMY    . HERNIA REPAIR     UMBILICAL  . LASIK     BILATERAL  . TEE WITHOUT CARDIOVERSION N/A 11/24/2019   Procedure: TRANSESOPHAGEAL ECHOCARDIOGRAM (TEE);  Surgeon: Larey Dresser, MD;  Location: [facility] ENDOSCOPY;  Service: Cardiovascular;  Laterality: N/A;  . TEE WITHOUT CARDIOVERSION N/A 05/03/2020   Procedure: TRANSESOPHAGEAL ECHOCARDIOGRAM (TEE);  Surgeon: Donato Heinz, MD;  Location: [facility] ENDOSCOPY;  Service: Cardiovascular;  Laterality: N/A;    Current Outpatient Medications  Medication Sig Dispense Refill  . apixaban (ELIQUIS) 5 MG TABS tablet Take 1 tablet (5 mg total) by mouth 2 (two) times daily. 180 tablet 0  . busPIRone (BUSPAR) 15 MG tablet TAKE 1 TABLET THREE TIMES A DAY (Patient taking differently: Take 15 mg by mouth in the morning, at noon, and at bedtime.) 270 tablet 1  . DULoxetine (CYMBALTA) 30 MG capsule Take 3 capsules (90 mg total) by mouth daily. 270 capsule 1  . esomeprazole (NEXIUM) 40 MG capsule Take 40 mg by mouth as needed (indigestion/heartburn).     Christianne Dolin HIGH-DOSE QUADRIVALENT 0.7 ML SUSY     . levocetirizine (XYZAL) 5 MG  tablet Take 5 mg by mouth at bedtime.     Marland Kitchen levothyroxine (SYNTHROID, LEVOTHROID) 75 MCG tablet Take 75 mcg by mouth daily before breakfast.     . metFORMIN (GLUCOPHAGE) 500 MG tablet Take 500 mg by mouth daily.    . metolazone (ZAROXOLYN) 2.5 MG tablet Take 1 tablet (2.5 mg total) by mouth once a week. On Fridays (Patient taking differently: Take 2.5 mg by mouth every Friday.) 30 tablet 8  . Misc.  Devices (BARIATRIC ROLLATOR) MISC For home use 1 each 0  . montelukast (SINGULAIR) 10 MG tablet Take 10 mg by mouth at bedtime.     . Multiple Vitamins-Minerals (PRESERVISION AREDS 2 PO) Take 1 tablet by mouth in the morning and at bedtime.    Marland Kitchen NYAMYC powder Apply 1 application topically as needed (rash under breasts).     . Potassium Chloride ER 20 MEQ TBCR Take 20 mEq by mouth 2 (two) times daily. Take extra 20 meq on days she takes extra diuretic. (Patient taking differently: Take 20 mEq by mouth 2 (two) times daily. Take 1 tablet (20 meq) by mouth twice daily & Take extra 20 meq on Fridays with extra diuretic.) 200 tablet 1  . rosuvastatin (CRESTOR) 5 MG tablet Take 5 mg by mouth daily.    Marland Kitchen spironolactone (ALDACTONE) 25 MG tablet TAKE 1/2 TABLET DAILY. (Patient taking differently: Take 12.5 mg by mouth daily.) 15 tablet 9  . torsemide (DEMADEX) 20 MG tablet Take 3 tablets (60 mg total) every morning.  Take 2 tablets (40 mg total) every evening. (Patient taking differently: Take 40-60 mg by mouth See admin instructions. Take 3 tablets (60 mg total) every morning & take 2 tablets (40 mg total) every evening.) 150 tablet 3  . traZODone (DESYREL) 100 MG tablet Take 2 tablets (200 mg total) by mouth at bedtime. 180 tablet 1  . triamcinolone cream (KENALOG) 0.1 % Apply 1 application topically daily. Applied to legs    . amiodarone (PACERONE) 200 MG tablet Take 1 tablet (200 mg total) by mouth daily. 90 tablet 1   No current facility-administered medications for this encounter.    Allergies  Allergen Reactions  . Azithromycin Rash    Severe rash    Social History   Socioeconomic History  . Marital status: Legally Separated    Spouse name: Not on file  . Number of children: Not on file  . Years of education: Not on file  . Highest education level: Not on file  Occupational History  . Not on file  Tobacco Use  . Smoking status: Never Smoker  . Smokeless tobacco: Never Used  Vaping Use   . Vaping Use: Never used  Substance and Sexual Activity  . Alcohol use: Yes    Alcohol/week: 1.0 standard drink    Types: 1 Glasses of wine per week    Comment: occ  . Drug use: No  . Sexual activity: Not Currently  Other Topics Concern  . Not on file  Social History Narrative  . Not on file   Social Determinants of Health   Financial Resource Strain: Not on file  Food Insecurity: Not on file  Transportation Needs: Not on file  Physical Activity: Not on file  Stress: Not on file  Social Connections: Not on file  Intimate Partner Violence: Not on file    Family History  Problem Relation Age of Onset  . Lung cancer Mother   . Colon cancer Father   . Heart attack Brother   .  Heart disease Brother   . Lung cancer Maternal Grandfather   . Heart attack Paternal Grandfather     ROS- All systems are reviewed and negative except as per the HPI above  Physical Exam: Vitals:   06/26/20 1353  BP: (!) 150/64  Pulse: 83  Height: 5\' 6"  (1.676 m)   Wt Readings from Last 3 Encounters:  06/19/20 127.9 kg  05/04/20 134.7 kg  05/03/20 134.7 kg    Labs: Lab Results  Component Value Date   NA 139 06/04/2020   K 3.5 06/04/2020   CL 94 (L) 06/04/2020   CO2 32 06/04/2020   GLUCOSE 108 (H) 06/04/2020   BUN 29 (H) 06/04/2020   CREATININE 1.70 (H) 06/04/2020   CALCIUM 9.3 06/04/2020   MG 2.2 11/26/2019   Lab Results  Component Value Date   INR 2.9 03/20/2020   No results found for: CHOL, HDL, LDLCALC, TRIG   GEN- The patient is well appearing, alert and oriented x 3 today.   Head- normocephalic, atraumatic Eyes-  Sclera clear, conjunctiva pink Ears- hearing intact Oropharynx- clear Neck- supple, no JVP Lymph- no cervical lymphadenopathy Lungs- Clear to ausculation bilaterally, normal work of breathing Heart- irregular rate and rhythm, no murmurs, rubs or gallops, PMI not laterally displaced GI- soft, NT, ND, + BS Extremities- no clubbing, cyanosis, or edema MS-  no significant deformity or atrophy Skin- no rash or lesion Psych- euthymic mood, full affect Neuro- strength and sensation are intact  EKG- afib at 83 bpm, RBBB qrs int 140 ms, qtc 481 ms    Assessment and Plan: 1. Persistent  afib  S/p ablation one month ago and unfortunately was in Afib rate controlled  I discussed cardioversion with her at one month f/u and she was upset about having to go thru it again but did agree to pursue It was initially successful but is back in rate controlled afib today She states that she can  not walk any distance at all without being very short of breath, QOL is poor Reduce amiodarone to 200 mg qd   She would like her appointment with Dr. Curt Bears moved up to early January Possibly could try amiodarone washout and attempt dofetilide in the spring if Dr. Curt Bears feels would be appropriate   2. CHA2DS2VAScore at least 4 Continue  eliquis 5 mg bid   3. Diastolic heart failure  Appears to be normovolemic Continue  torsemide, HCTZ, Zaroxolyn  as scheduled      Butch Penny C. Cortnee Steinmiller, [facility] [address] [place], [place] [address] [phone]

## 2020-07-05 DIAGNOSIS — E7800 Pure hypercholesterolemia, unspecified: Secondary | ICD-10-CM | POA: Diagnosis not present

## 2020-07-05 DIAGNOSIS — M1710 Unilateral primary osteoarthritis, unspecified knee: Secondary | ICD-10-CM | POA: Diagnosis not present

## 2020-07-05 DIAGNOSIS — F321 Major depressive disorder, single episode, moderate: Secondary | ICD-10-CM | POA: Diagnosis not present

## 2020-07-05 DIAGNOSIS — E119 Type 2 diabetes mellitus without complications: Secondary | ICD-10-CM | POA: Diagnosis not present

## 2020-07-05 DIAGNOSIS — N183 Chronic kidney disease, stage 3 unspecified: Secondary | ICD-10-CM | POA: Diagnosis not present

## 2020-07-05 DIAGNOSIS — I4891 Unspecified atrial fibrillation: Secondary | ICD-10-CM | POA: Diagnosis not present

## 2020-07-05 DIAGNOSIS — I5032 Chronic diastolic (congestive) heart failure: Secondary | ICD-10-CM | POA: Diagnosis not present

## 2020-07-05 DIAGNOSIS — I482 Chronic atrial fibrillation, unspecified: Secondary | ICD-10-CM | POA: Diagnosis not present

## 2020-07-05 DIAGNOSIS — E039 Hypothyroidism, unspecified: Secondary | ICD-10-CM | POA: Diagnosis not present

## 2020-07-19 ENCOUNTER — Encounter (HOSPITAL_COMMUNITY): Payer: PPO | Admitting: Cardiology

## 2020-07-23 ENCOUNTER — Other Ambulatory Visit (HOSPITAL_COMMUNITY): Payer: Self-pay | Admitting: Cardiology

## 2020-07-23 DIAGNOSIS — I5032 Chronic diastolic (congestive) heart failure: Secondary | ICD-10-CM

## 2020-07-24 ENCOUNTER — Ambulatory Visit (INDEPENDENT_AMBULATORY_CARE_PROVIDER_SITE_OTHER): Payer: PPO | Admitting: Cardiology

## 2020-07-24 ENCOUNTER — Other Ambulatory Visit: Payer: Self-pay

## 2020-07-24 ENCOUNTER — Encounter: Payer: Self-pay | Admitting: Cardiology

## 2020-07-24 VITALS — BP 130/74 | HR 84 | Ht 66.0 in | Wt 282.0 lb

## 2020-07-24 DIAGNOSIS — I4819 Other persistent atrial fibrillation: Secondary | ICD-10-CM | POA: Diagnosis not present

## 2020-07-24 DIAGNOSIS — Z79899 Other long term (current) drug therapy: Secondary | ICD-10-CM

## 2020-07-24 NOTE — Progress Notes (Signed)
Electrophysiology Office Note   Date:  07/24/2020   ID:  Hannah [name], DOB 08/12/1945, MRN [id number]  PCP:  Lujean Amel, MD Primary Electrophysiologist:  Constance Haw, MD    No chief complaint on file.    History of Present Illness: Hannah [name] is a 75 y.o. female who is being seen today for the evaluation of CHF, AF at the request of Koirala, Dibas, MD. Presenting today for electrophysiology evaluation.    She has a history of atrial fibrillation, chronic diastolic heart failure, hyperlipidemia, and morbid obesity.  She has been on amiodarone but unfortunately has had multiple cardioversions and has reverted back to atrial fibrillation each time.  She is now status post AF ablation 05/04/2020.  Unfortunately, after ablation she is gone back into atrial fibrillation.  Today, denies symptoms of palpitations, chest pain, shortness of breath, orthopnea, PND, lower extremity edema, claudication, dizziness, presyncope, syncope, bleeding, or neurologic sequela. The patient is tolerating medications without difficulties.  She continues to have episodes of fatigue and shortness of breath.  This occurred since going back into atrial fibrillation.  She is feels that she went into atrial fibrillation quite quickly after her ablation.  She has tried cardioversion, but each time, she quickly goes back into atrial fibrillation.  Past Medical History:  Diagnosis Date  . Atrial fibrillation ([place])   . Diabetes mellitus without complication ([place])   . Glaucoma    BILATERAL  . Hypertension   . Thyroid disease   . Volume overload 11/2019   Past Surgical History:  Procedure Laterality Date  . APPENDECTOMY    . ATRIAL FIBRILLATION ABLATION N/A 05/04/2020   Procedure: ATRIAL FIBRILLATION ABLATION;  Surgeon: Constance Haw, MD;  Location: [facility];  Service: Cardiovascular;  Laterality: N/A;  . CARDIOVERSION  11/24/2019  . CARDIOVERSION N/A 11/24/2019    Procedure: CARDIOVERSION;  Surgeon: Larey Dresser, MD;  Location: [facility] ENDOSCOPY;  Service: Cardiovascular;  Laterality: N/A;  . CARDIOVERSION N/A 11/29/2019   Procedure: CARDIOVERSION;  Surgeon: Larey Dresser, MD;  Location: [facility] ENDOSCOPY;  Service: Cardiovascular;  Laterality: N/A;  . CARDIOVERSION N/A 06/19/2020   Procedure: CARDIOVERSION;  Surgeon: Dorothy Spark, MD;  Location: [facility];  Service: Cardiovascular;  Laterality: N/A;  . CHOLECYSTECTOMY    . HERNIA REPAIR     UMBILICAL  . LASIK     BILATERAL  . TEE WITHOUT CARDIOVERSION N/A 11/24/2019   Procedure: TRANSESOPHAGEAL ECHOCARDIOGRAM (TEE);  Surgeon: Larey Dresser, MD;  Location: [facility] ENDOSCOPY;  Service: Cardiovascular;  Laterality: N/A;  . TEE WITHOUT CARDIOVERSION N/A 05/03/2020   Procedure: TRANSESOPHAGEAL ECHOCARDIOGRAM (TEE);  Surgeon: Donato Heinz, MD;  Location: [facility] ENDOSCOPY;  Service: Cardiovascular;  Laterality: N/A;     Current Outpatient Medications  Medication Sig Dispense Refill  . apixaban (ELIQUIS) 5 MG TABS tablet Take 1 tablet (5 mg total) by mouth 2 (two) times daily. 180 tablet 0  . busPIRone (BUSPAR) 15 MG tablet TAKE 1 TABLET THREE TIMES A DAY 270 tablet 1  . DULoxetine (CYMBALTA) 30 MG capsule Take 3 capsules (90 mg total) by mouth daily. 270 capsule 1  . esomeprazole (NEXIUM) 40 MG capsule Take 40 mg by mouth as needed (indigestion/heartburn).     Christianne Dolin HIGH-DOSE QUADRIVALENT 0.7 ML SUSY     . levocetirizine (XYZAL) 5 MG tablet Take 5 mg by mouth at bedtime.     Marland Kitchen levothyroxine (SYNTHROID, LEVOTHROID) 75 MCG tablet Take 75 mcg by mouth daily before breakfast.     .  metFORMIN (GLUCOPHAGE) 500 MG tablet Take 500 mg by mouth daily.    . metolazone (ZAROXOLYN) 2.5 MG tablet Take 1 tablet (2.5 mg total) by mouth once a week. On Fridays 30 tablet 8  . Misc. Devices (BARIATRIC ROLLATOR) MISC For home use 1 each 0  . montelukast (SINGULAIR) 10 MG tablet Take 10 mg by mouth at bedtime.      . Multiple Vitamins-Minerals (PRESERVISION AREDS 2 PO) Take 1 tablet by mouth in the morning and at bedtime.    Marland Kitchen NYAMYC powder Apply 1 application topically as needed (rash under breasts).     . Potassium Chloride ER 20 MEQ TBCR TAKE ONE TABLET BY MOUTH TWICE A DAY. TAKE EXTRA TABLET (20MEQ)ON DAYS YOU TAKE EXTRA DIURETIC AS DIRECTED BY YOUR DOCTOR. 200 tablet 1  . rosuvastatin (CRESTOR) 5 MG tablet Take 5 mg by mouth daily.    Marland Kitchen spironolactone (ALDACTONE) 25 MG tablet TAKE 1/2 TABLET DAILY. (Patient taking differently: Take 12.5 mg by mouth daily.) 15 tablet 9  . torsemide (DEMADEX) 20 MG tablet Take 3 tablets (60 mg total) every morning.  Take 2 tablets (40 mg total) every evening. (Patient taking differently: Take 40-60 mg by mouth See admin instructions. Take 3 tablets (60 mg total) every morning & take 2 tablets (40 mg total) every evening.) 150 tablet 3  . traZODone (DESYREL) 100 MG tablet Take 2 tablets (200 mg total) by mouth at bedtime. 180 tablet 1  . triamcinolone cream (KENALOG) 0.1 % Apply 1 application topically daily. Applied to legs     No current facility-administered medications for this visit.    Allergies:   Azithromycin   Social History:  The patient  reports that she has never smoked. She has never used smokeless tobacco. She reports current alcohol use of about 1.0 standard drink of alcohol per week. She reports that she does not use drugs.   Family History:  The patient's family history includes Colon cancer in her father; Heart attack in her brother and paternal grandfather; Heart disease in her brother; Lung cancer in her maternal grandfather and mother.   ROS:  Please see the history of present illness.   Otherwise, review of systems is positive for none.   All other systems are reviewed and negative.   PHYSICAL EXAM: VS:  BP 130/74   Pulse 84   Ht 5\' 6"  (1.676 m)   Wt 282 lb (127.9 kg) Comment: used last weight, refused to step on scale today az,cma  SpO2 98%    BMI 45.52 kg/m  , BMI Body mass index is 45.52 kg/m. GEN: Well nourished, well developed, in no acute distress  HEENT: normal  Neck: no JVD, carotid bruits, or masses Cardiac: irregular; no murmurs, rubs, or gallops,no edema  Respiratory:  clear to auscultation bilaterally, normal work of breathing GI: soft, nontender, nondistended, + BS MS: no deformity or atrophy  Skin: warm and dry Neuro:  Strength and sensation are intact Psych: euthymic mood, full affect  EKG:  EKG is ordered today. Personal review of the ekg ordered shows atrial fibrillation, right bundle branch block, rate 83  Recent Labs: 11/26/2019: Magnesium 2.2 06/04/2020: ALT 18; BUN 29; Creatinine, Ser 1.70; Hemoglobin 9.9; Platelets 226; Potassium 3.5; Sodium 139; TSH 5.705    Lipid Panel  No results found for: CHOL, TRIG, HDL, CHOLHDL, VLDL, LDLCALC, LDLDIRECT   Wt Readings from Last 3 Encounters:  07/24/20 282 lb (127.9 kg)  06/19/20 282 lb (127.9 kg)  05/04/20 297 lb (  134.7 kg)      Other studies Reviewed: Additional studies/ records that were reviewed today include: TTE 01/18/18 Review of the above records today demonstrates:  - Left ventricle: The cavity size was mildly dilated. Systolic   function was normal. The estimated ejection fraction was in the   range of 60% to 65%. Wall motion was normal; there were no   regional wall motion abnormalities. Left ventricular diastolic   function parameters were normal. - Left atrium: The atrium was severely dilated. - Atrial septum: There was increased thickness of the septum,   consistent with lipomatous hypertrophy.   ASSESSMENT AND PLAN:  1.  Persistent atrial fibrillation: Currently on amiodarone and Eliquis.  CHA2DS2-VASc of 4.  She is status post AF ablation 05/04/2020.  Unfortunately she has had more frequent episodes of atrial fibrillation.  At this point, she does not wish to try another cardioversion on amiodarone.  We Marrisa Kimber stop amiodarone.  We Zerah Hilyer  out for a washout and bring her back in 3 months for further discussion of other potential medications such as Tikosyn.  2.  Chronic diastolic heart failure: Likely untreated sleep apnea and RV failure.  NYHA class III symptoms.  Diuresis complicated by cardiorenal syndrome.  Plan per primary cardiology.  3.  Obstructive sleep apnea: CPAP compliance encouraged  4.  Morbid obesity: Patient referred to the [facility] prep class.  Diet and exercise encouraged.  Current medicines are reviewed at length with the patient today.   The patient does not have concerns regarding her medicines.  The following changes were made today: Stop amiodarone  Labs/ tests ordered today include:  Orders Placed This Encounter  Procedures  . EKG 12-Lead     Disposition:   FU with Doneshia [facility] 3 months  Signed, Ashutosh Dieguez Meredith Leeds, MD  07/24/2020 12:17 PM     [place] [id number] [phone] (office) [phone] (fax)

## 2020-07-24 NOTE — Patient Instructions (Signed)
Medication Instructions:  Your physician has recommended you make the following change in your medication:  1. STOP Amiodarone 2. He is recommending starting Tikosyn, after the Amiodarone has "washed out".    We will re-discuss this at your next office visit.  *If you need a refill on your cardiac medications before your next appointment, please call your pharmacy*   Lab Work: Your physician recommends that you return for lab work in: 3 months for Amiodarone level (have done before your follow up with [place])  If you have labs (blood work) drawn today and your tests are completely normal, you will receive your results only by: Marland Kitchen MyChart Message (if you have MyChart) OR . A paper copy in the mail If you have any lab test that is abnormal or we need to change your treatment, we will call you to review the results.   Testing/Procedures: None ordered   Follow-Up: At [facility], you and your health needs are our priority.  As part of our continuing mission to provide you with exceptional heart care, we have created designated Provider Care Teams.  These Care Teams include your primary Cardiologist (physician) and Advanced Practice Providers (APPs -  Physician Assistants and Nurse Practitioners) who all work together to provide you with the care you need, when you need it.  Your next appointment:   3 month(s)  The format for your next appointment:   In Person  Provider:   Allegra Lai, MD    Thank you for choosing [facility]!!   Trinidad Curet, RN [phone]   Other Instructions  Preparing for Tikosyn (Dofetilide) Admission  1) Check with insurance company for cost of drug to ensure affordability.  Dofetilide 500 mcg twice a day  2) Tikosyn admission requires a 3 day hospital stay with constant telemetry monitoring. You will have and EKG after each dose of Tikosyn.  If the drug does not convert you to normal rhythm a cardioversion will be performed prior to  discharge.  3) A pharmacist will review all of your medications.  If there are any interactions noted we will be in touch with you.  4) Please ensure no missed doses of your anticoagulation (blood thinner) for the past 3 weeks prior to admission.  5) No Benadryl is allowed 3 days prior to admission.  6) On day of admission - you will come in for office visit in the [facility] where lab work will be drawn.  IF the labs are acceptable an inpatient bed will be requested.  7) Normally admission to a bed is straight from our office.  This is all dependent on bed availability.  In some instances you may be sent home and bed placement will call later in the day when a bed is available.  8) You may bring personal belongings/clothing with you to the hospital.  Please leave your suitcase in the care until you arrive in admissions.  Questions - please call the office at [phone]

## 2020-07-25 DIAGNOSIS — G4733 Obstructive sleep apnea (adult) (pediatric): Secondary | ICD-10-CM | POA: Diagnosis not present

## 2020-07-25 DIAGNOSIS — I5032 Chronic diastolic (congestive) heart failure: Secondary | ICD-10-CM | POA: Diagnosis not present

## 2020-08-03 DIAGNOSIS — I4891 Unspecified atrial fibrillation: Secondary | ICD-10-CM | POA: Diagnosis not present

## 2020-08-03 DIAGNOSIS — E039 Hypothyroidism, unspecified: Secondary | ICD-10-CM | POA: Diagnosis not present

## 2020-08-03 DIAGNOSIS — I482 Chronic atrial fibrillation, unspecified: Secondary | ICD-10-CM | POA: Diagnosis not present

## 2020-08-03 DIAGNOSIS — E119 Type 2 diabetes mellitus without complications: Secondary | ICD-10-CM | POA: Diagnosis not present

## 2020-08-03 DIAGNOSIS — F321 Major depressive disorder, single episode, moderate: Secondary | ICD-10-CM | POA: Diagnosis not present

## 2020-08-03 DIAGNOSIS — N183 Chronic kidney disease, stage 3 unspecified: Secondary | ICD-10-CM | POA: Diagnosis not present

## 2020-08-03 DIAGNOSIS — I5032 Chronic diastolic (congestive) heart failure: Secondary | ICD-10-CM | POA: Diagnosis not present

## 2020-08-03 DIAGNOSIS — M1710 Unilateral primary osteoarthritis, unspecified knee: Secondary | ICD-10-CM | POA: Diagnosis not present

## 2020-08-03 DIAGNOSIS — E7800 Pure hypercholesterolemia, unspecified: Secondary | ICD-10-CM | POA: Diagnosis not present

## 2020-08-09 ENCOUNTER — Ambulatory Visit: Payer: PPO | Admitting: Cardiology

## 2020-08-25 DIAGNOSIS — G4733 Obstructive sleep apnea (adult) (pediatric): Secondary | ICD-10-CM | POA: Diagnosis not present

## 2020-08-25 DIAGNOSIS — I5032 Chronic diastolic (congestive) heart failure: Secondary | ICD-10-CM | POA: Diagnosis not present

## 2020-09-11 ENCOUNTER — Encounter (HOSPITAL_COMMUNITY): Payer: PPO | Admitting: Cardiology

## 2020-09-21 ENCOUNTER — Encounter (HOSPITAL_COMMUNITY): Payer: PPO | Admitting: Cardiology

## 2020-09-22 DIAGNOSIS — G4733 Obstructive sleep apnea (adult) (pediatric): Secondary | ICD-10-CM | POA: Diagnosis not present

## 2020-09-22 DIAGNOSIS — I5032 Chronic diastolic (congestive) heart failure: Secondary | ICD-10-CM | POA: Diagnosis not present

## 2020-09-26 DIAGNOSIS — I5032 Chronic diastolic (congestive) heart failure: Secondary | ICD-10-CM | POA: Diagnosis not present

## 2020-09-26 DIAGNOSIS — I482 Chronic atrial fibrillation, unspecified: Secondary | ICD-10-CM | POA: Diagnosis not present

## 2020-09-26 DIAGNOSIS — F321 Major depressive disorder, single episode, moderate: Secondary | ICD-10-CM | POA: Diagnosis not present

## 2020-09-26 DIAGNOSIS — E7800 Pure hypercholesterolemia, unspecified: Secondary | ICD-10-CM | POA: Diagnosis not present

## 2020-09-26 DIAGNOSIS — N183 Chronic kidney disease, stage 3 unspecified: Secondary | ICD-10-CM | POA: Diagnosis not present

## 2020-09-26 DIAGNOSIS — E039 Hypothyroidism, unspecified: Secondary | ICD-10-CM | POA: Diagnosis not present

## 2020-09-26 DIAGNOSIS — E119 Type 2 diabetes mellitus without complications: Secondary | ICD-10-CM | POA: Diagnosis not present

## 2020-09-26 DIAGNOSIS — M1710 Unilateral primary osteoarthritis, unspecified knee: Secondary | ICD-10-CM | POA: Diagnosis not present

## 2020-09-26 DIAGNOSIS — I4891 Unspecified atrial fibrillation: Secondary | ICD-10-CM | POA: Diagnosis not present

## 2020-10-20 ENCOUNTER — Other Ambulatory Visit: Payer: Self-pay | Admitting: Cardiology

## 2020-10-26 ENCOUNTER — Other Ambulatory Visit: Payer: PPO | Admitting: *Deleted

## 2020-10-26 ENCOUNTER — Other Ambulatory Visit: Payer: Self-pay

## 2020-10-26 ENCOUNTER — Other Ambulatory Visit: Payer: Self-pay | Admitting: Cardiology

## 2020-10-26 DIAGNOSIS — I4819 Other persistent atrial fibrillation: Secondary | ICD-10-CM | POA: Diagnosis not present

## 2020-10-26 DIAGNOSIS — Z79899 Other long term (current) drug therapy: Secondary | ICD-10-CM | POA: Diagnosis not present

## 2020-10-31 ENCOUNTER — Ambulatory Visit (INDEPENDENT_AMBULATORY_CARE_PROVIDER_SITE_OTHER): Payer: PPO | Admitting: Cardiology

## 2020-10-31 ENCOUNTER — Encounter: Payer: Self-pay | Admitting: Cardiology

## 2020-10-31 ENCOUNTER — Other Ambulatory Visit: Payer: Self-pay

## 2020-10-31 VITALS — BP 132/70 | HR 81 | Ht 66.0 in | Wt 305.0 lb

## 2020-10-31 DIAGNOSIS — I4819 Other persistent atrial fibrillation: Secondary | ICD-10-CM | POA: Diagnosis not present

## 2020-10-31 DIAGNOSIS — Z79899 Other long term (current) drug therapy: Secondary | ICD-10-CM | POA: Diagnosis not present

## 2020-10-31 LAB — BASIC METABOLIC PANEL
BUN/Creatinine Ratio: 26 (ref 12–28)
BUN: 42 mg/dL — ABNORMAL HIGH (ref 8–27)
CO2: 32 mmol/L — ABNORMAL HIGH (ref 20–29)
Calcium: 9.5 mg/dL (ref 8.7–10.3)
Chloride: 93 mmol/L — ABNORMAL LOW (ref 96–106)
Creatinine, Ser: 1.61 mg/dL — ABNORMAL HIGH (ref 0.57–1.00)
Glucose: 103 mg/dL — ABNORMAL HIGH (ref 65–99)
Potassium: 3.8 mmol/L (ref 3.5–5.2)
Sodium: 139 mmol/L (ref 134–144)
eGFR: 34 mL/min/{1.73_m2} — ABNORMAL LOW (ref >59)

## 2020-10-31 NOTE — Patient Instructions (Addendum)
Medication Instructions:  Your physician recommends that you continue on your current medications as directed. Please refer to the Current Medication list given to you today.  *If you need a refill on your cardiac medications before your next appointment, please call your pharmacy*   Lab Work: Today: BMET If you have labs (blood work) drawn today and your tests are completely normal, you will receive your results only by: Marland Kitchen MyChart Message (if you have MyChart) OR . A paper copy in the mail If you have any lab test that is abnormal or we need to change your treatment, we will call you to review the results.   Testing/Procedures: None ordered   Follow-Up: At [facility], you and your health needs are our priority.  As part of our continuing mission to provide you with exceptional heart care, we have created designated Provider Care Teams.  These Care Teams include your primary Cardiologist (physician) and Advanced Practice Providers (APPs -  Physician Assistants and Nurse Practitioners) who all work together to provide you with the care you need, when you need it.  Your next appointment:    to be determined  The format for your next appointment:   In Person  Provider:   Allegra Lai, MD    Thank you for choosing [facility]!!   Trinidad Curet, RN [phone]

## 2020-10-31 NOTE — Progress Notes (Signed)
Electrophysiology Office Note   Date:  10/31/2020   ID:  Hannah [name], DOB [birth year], MRN [id number]  PCP:  Lujean Amel, MD Primary Electrophysiologist:  Constance Haw, MD    No chief complaint on file.    History of Present Illness: Hannah [name] is a 75 y.o. female who is being seen today for the evaluation of CHF, AF at the request of Koirala, Dibas, MD. Presenting today for electrophysiology evaluation.    She has a history significant for atrial fibrillation, chronic diastolic heart failure, hyperlipidemia, and morbid obesity.  She is status post AF ablation 05/04/2020.  Unfortunately she has gone back into atrial fibrillation.  Her amiodarone was stopped.  Today, denies symptoms of palpitations, chest pain, shortness of breath, orthopnea, PND, lower extremity edema, claudication, dizziness, presyncope, syncope, bleeding, or neurologic sequela. The patient is tolerating medications without difficulties.  She continues to have shortness of breath and fatigue.  She is short of breath on walking around the grocery store.  She unfortunately remains in atrial fibrillation.  Past Medical History:  Diagnosis Date  . Atrial fibrillation ([place])   . Diabetes mellitus without complication ([place])   . Glaucoma    BILATERAL  . Hypertension   . Thyroid disease   . Volume overload 11/2019   Past Surgical History:  Procedure Laterality Date  . APPENDECTOMY    . ATRIAL FIBRILLATION ABLATION N/A 05/04/2020   Procedure: ATRIAL FIBRILLATION ABLATION;  Surgeon: Constance Haw, MD;  Location: [facility];  Service: Cardiovascular;  Laterality: N/A;  . CARDIOVERSION  11/24/2019  . CARDIOVERSION N/A 11/24/2019   Procedure: CARDIOVERSION;  Surgeon: Larey Dresser, MD;  Location: [facility] ENDOSCOPY;  Service: Cardiovascular;  Laterality: N/A;  . CARDIOVERSION N/A 11/29/2019   Procedure: CARDIOVERSION;  Surgeon: Larey Dresser, MD;  Location: [facility] ENDOSCOPY;  Service:  Cardiovascular;  Laterality: N/A;  . CARDIOVERSION N/A 06/19/2020   Procedure: CARDIOVERSION;  Surgeon: Dorothy Spark, MD;  Location: [facility];  Service: Cardiovascular;  Laterality: N/A;  . CHOLECYSTECTOMY    . HERNIA REPAIR     UMBILICAL  . LASIK     BILATERAL  . TEE WITHOUT CARDIOVERSION N/A 11/24/2019   Procedure: TRANSESOPHAGEAL ECHOCARDIOGRAM (TEE);  Surgeon: Larey Dresser, MD;  Location: [facility] ENDOSCOPY;  Service: Cardiovascular;  Laterality: N/A;  . TEE WITHOUT CARDIOVERSION N/A 05/03/2020   Procedure: TRANSESOPHAGEAL ECHOCARDIOGRAM (TEE);  Surgeon: Donato Heinz, MD;  Location: [facility] ENDOSCOPY;  Service: Cardiovascular;  Laterality: N/A;     Current Outpatient Medications  Medication Sig Dispense Refill  . apixaban (ELIQUIS) 5 MG TABS tablet Take 1 tablet (5 mg total) by mouth 2 (two) times daily. 180 tablet 0  . busPIRone (BUSPAR) 15 MG tablet TAKE 1 TABLET THREE TIMES A DAY 270 tablet 1  . DULoxetine (CYMBALTA) 30 MG capsule Take 3 capsules (90 mg total) by mouth daily. 270 capsule 1  . esomeprazole (NEXIUM) 40 MG capsule Take 40 mg by mouth as needed (indigestion/heartburn).     Christianne Dolin HIGH-DOSE QUADRIVALENT 0.7 ML SUSY     . levocetirizine (XYZAL) 5 MG tablet Take 5 mg by mouth at bedtime.     Marland Kitchen levothyroxine (SYNTHROID, LEVOTHROID) 75 MCG tablet Take 75 mcg by mouth daily before breakfast.     . metFORMIN (GLUCOPHAGE) 500 MG tablet Take 500 mg by mouth daily.    . metolazone (ZAROXOLYN) 2.5 MG tablet Take 1 tablet (2.5 mg total) by mouth once a week. On Fridays 30 tablet 8  .  Misc. Devices (BARIATRIC ROLLATOR) MISC For home use 1 each 0  . montelukast (SINGULAIR) 10 MG tablet Take 10 mg by mouth at bedtime.     . Multiple Vitamins-Minerals (PRESERVISION AREDS 2 PO) Take 1 tablet by mouth in the morning and at bedtime.    Marland Kitchen NYAMYC powder Apply 1 application topically as needed (rash under breasts).     . Potassium Chloride ER 20 MEQ TBCR TAKE ONE TABLET BY  MOUTH TWICE A DAY. TAKE EXTRA TABLET (20MEQ)ON DAYS YOU TAKE EXTRA DIURETIC AS DIRECTED BY YOUR DOCTOR. 200 tablet 1  . rosuvastatin (CRESTOR) 5 MG tablet Take 5 mg by mouth daily.    Marland Kitchen spironolactone (ALDACTONE) 25 MG tablet TAKE 1/2 TABLET BY MOUTH DAILY 45 tablet 10  . torsemide (DEMADEX) 20 MG tablet Take 3 tablets (60 mg total) every morning.  Take 2 tablets (40 mg total) every evening. (Patient taking differently: Take 40-60 mg by mouth See admin instructions. Take 3 tablets (60 mg total) every morning & take 2 tablets (40 mg total) every evening.) 150 tablet 3  . traZODone (DESYREL) 100 MG tablet Take 2 tablets (200 mg total) by mouth at bedtime. 180 tablet 1  . triamcinolone cream (KENALOG) 0.1 % Apply 1 application topically daily. Applied to legs     No current facility-administered medications for this visit.    Allergies:   Azithromycin   Social History:  The patient  reports that she has never smoked. She has never used smokeless tobacco. She reports current alcohol use of about 1.0 standard drink of alcohol per week. She reports that she does not use drugs.   Family History:  The patient's family history includes Colon cancer in her father; Heart attack in her brother and paternal grandfather; Heart disease in her brother; Lung cancer in her maternal grandfather and mother.   ROS:  Please see the history of present illness.   Otherwise, review of systems is positive for none.   All other systems are reviewed and negative.   PHYSICAL EXAM: VS:  BP 132/70   Pulse 81   Ht '5\' 6"'$  (1.676 m)   Wt (!) 305 lb (138.3 kg)   SpO2 98%   BMI 49.23 kg/m  , BMI Body mass index is 49.23 kg/m. GEN: Well nourished, well developed, in no acute distress  HEENT: normal  Neck: no JVD, carotid bruits, or masses Cardiac: Irregular; no murmurs, rubs, or gallops,no edema  Respiratory:  clear to auscultation bilaterally, normal work of breathing GI: soft, nontender, nondistended, + BS MS: no  deformity or atrophy  Skin: warm and dry Neuro:  Strength and sensation are intact Psych: euthymic mood, full affect  EKG:  EKG is ordered today. Personal review of the ekg ordered shows atrial fibrillation, right bundle branch block  Recent Labs: 11/26/2019: Magnesium 2.2 06/04/2020: ALT 18; BUN 29; Creatinine, Ser 1.70; Hemoglobin 9.9; Platelets 226; Potassium 3.5; Sodium 139; TSH 5.705    Lipid Panel  No results found for: CHOL, TRIG, HDL, CHOLHDL, VLDL, LDLCALC, LDLDIRECT   Wt Readings from Last 3 Encounters:  10/31/20 (!) 305 lb (138.3 kg)  07/24/20 282 lb (127.9 kg)  06/19/20 282 lb (127.9 kg)      Other studies Reviewed: Additional studies/ records that were reviewed today include: TTE 01/18/18 Review of the above records today demonstrates:  - Left ventricle: The cavity size was mildly dilated. Systolic   function was normal. The estimated ejection fraction was in the   range of 60%  to 65%. Wall motion was normal; there were no   regional wall motion abnormalities. Left ventricular diastolic   function parameters were normal. - Left atrium: The atrium was severely dilated. - Atrial septum: There was increased thickness of the septum,   consistent with lipomatous hypertrophy.   ASSESSMENT AND PLAN:  1.  Persistent atrial fibrillation: Currently on Eliquis.  CHA2DS2-VASc of 4.  Status post AF ablation 05/04/2020.  Unfortunately she is back in atrial fibrillation.  We Jacolyn Joaquin stop amiodarone.  We Kaelyn Innocent check amiodarone labs to see if we can start any other antiarrhythmic.  She would be amenable to going to the hospital for dofetilide load.  Her QTC is prolonged, but with correction for her right bundle branch block when she is in sinus rhythm, corrects to the 450s.  She does have CKD, and would require a reduced dose of Tikosyn which we have discussed.  2.  Chronic diastolic heart failure: Likely untreated sleep apnea and RV failure.  NYHA class III symptoms.  Diuresis  complicated by cardiorenal syndrome.  Plan per primary cardiology.    3.  Obstructive sleep apnea: CPAP compliance encouraged  4.  Morbid obesity: Going to the [facility] for exercise.  Diet and exercise encouraged.  Current medicines are reviewed at length with the patient today.   The patient does not have concerns regarding her medicines.  The following changes were made today: None  Labs/ tests ordered today include:  Orders Placed This Encounter  Procedures  . Basic metabolic panel  . EKG 12-Lead     Disposition:   FU with R[facility] [place] 6 months  Signed, Mckinnon Glick Meredith Leeds, MD  10/31/2020 2:50 PM     [place] [address] [phone] (office) [phone] (fax)

## 2020-11-02 LAB — AMIODARONE (CORDARONE), S/P
AMIODARONE: 209 ng/mL — ABNORMAL LOW (ref 1000–2500)
DESETHYLAMIODARONE: 274 ng/mL

## 2020-11-05 ENCOUNTER — Other Ambulatory Visit: Payer: Self-pay | Admitting: *Deleted

## 2020-11-05 MED ORDER — ELIQUIS 5 MG PO TABS
5.0000 mg | ORAL_TABLET | Freq: Two times a day (BID) | ORAL | 1 refills | Status: DC
Start: 2020-11-05 — End: 2021-06-05

## 2020-11-05 NOTE — Telephone Encounter (Signed)
Prescription refill request for Eliquis received.  Indication: afib  Last office visit: 10/31/2020, Camnitz Scr: 1.61, 10/31/2020 Age: 75 yo  Weight: 138.3 kg   Pt is on the correct dose of Eliquis per dosing criteria, prescription refill sent for Eliquis '5mg'$  BID.

## 2020-11-22 ENCOUNTER — Telehealth: Payer: Self-pay | Admitting: Cardiology

## 2020-11-22 NOTE — Telephone Encounter (Signed)
Patient's daughter calling to schedule hospital stay for the patient. She states it is supposed to be a 4 day stay where the patient transfers from one medication to another.

## 2020-11-22 NOTE — Telephone Encounter (Signed)
dtr aware Amiodarone level low enough. Tikosyn adx information sent via mychart. The will check on cost and let us know if affordable. dtr appreciates my call

## 2020-12-05 ENCOUNTER — Other Ambulatory Visit (HOSPITAL_COMMUNITY): Payer: Self-pay | Admitting: Cardiology

## 2020-12-05 DIAGNOSIS — I5032 Chronic diastolic (congestive) heart failure: Secondary | ICD-10-CM

## 2020-12-19 ENCOUNTER — Telehealth: Payer: Self-pay | Admitting: Pharmacist

## 2020-12-19 NOTE — Telephone Encounter (Signed)
Medication list reviewed in anticipation of upcoming Tikosyn initiation. Patient is  taking metolazone which will need to be stopped prior to Tikosyn start since it's a thiazide-type diuretic. She also takes high dose trazodone which is QTc prolonging - would need to be discussed with prescribing MD whether or not this could be stopped. Amiodarone level acceptable at < 0.22mg/mL (reported in ng/mL on 4/15 labs), med was stopped > 3 months ago on 07/24/20.  Patient is anticoagulated on Eliquis '5mg'$  BID on the appropriate dose. Please ensure that patient has not missed any anticoagulation doses in the 3 weeks prior to Tikosyn initiation.   Patient will need to be counseled to avoid use of Benadryl while on Tikosyn and in the 2-3 days prior to Tikosyn initiation.

## 2020-12-20 NOTE — Telephone Encounter (Signed)
If we are going to stop her metolazone, would increase her torsemide to 60 mg bid.

## 2020-12-24 DIAGNOSIS — N184 Chronic kidney disease, stage 4 (severe): Secondary | ICD-10-CM | POA: Diagnosis not present

## 2020-12-24 DIAGNOSIS — I5032 Chronic diastolic (congestive) heart failure: Secondary | ICD-10-CM | POA: Diagnosis not present

## 2020-12-24 DIAGNOSIS — E119 Type 2 diabetes mellitus without complications: Secondary | ICD-10-CM | POA: Diagnosis not present

## 2020-12-24 DIAGNOSIS — E039 Hypothyroidism, unspecified: Secondary | ICD-10-CM | POA: Diagnosis not present

## 2020-12-24 DIAGNOSIS — I4891 Unspecified atrial fibrillation: Secondary | ICD-10-CM | POA: Diagnosis not present

## 2020-12-24 DIAGNOSIS — F321 Major depressive disorder, single episode, moderate: Secondary | ICD-10-CM | POA: Diagnosis not present

## 2020-12-24 DIAGNOSIS — E7800 Pure hypercholesterolemia, unspecified: Secondary | ICD-10-CM | POA: Diagnosis not present

## 2020-12-31 DIAGNOSIS — N184 Chronic kidney disease, stage 4 (severe): Secondary | ICD-10-CM | POA: Diagnosis not present

## 2020-12-31 DIAGNOSIS — E039 Hypothyroidism, unspecified: Secondary | ICD-10-CM | POA: Diagnosis not present

## 2020-12-31 DIAGNOSIS — E119 Type 2 diabetes mellitus without complications: Secondary | ICD-10-CM | POA: Diagnosis not present

## 2021-01-01 NOTE — Telephone Encounter (Signed)
Pt notified of stopping metolazone and increasing toresmide to '60mg'$  BID Increase potassium to 11mq in the AM 272m in the PM. Pt also changed from trazodone to [place] PCP.

## 2021-01-02 ENCOUNTER — Other Ambulatory Visit (HOSPITAL_COMMUNITY): Payer: Self-pay | Admitting: *Deleted

## 2021-01-02 DIAGNOSIS — I5032 Chronic diastolic (congestive) heart failure: Secondary | ICD-10-CM | POA: Diagnosis not present

## 2021-01-02 DIAGNOSIS — E039 Hypothyroidism, unspecified: Secondary | ICD-10-CM | POA: Diagnosis not present

## 2021-01-02 DIAGNOSIS — M1710 Unilateral primary osteoarthritis, unspecified knee: Secondary | ICD-10-CM | POA: Diagnosis not present

## 2021-01-02 DIAGNOSIS — F321 Major depressive disorder, single episode, moderate: Secondary | ICD-10-CM | POA: Diagnosis not present

## 2021-01-02 DIAGNOSIS — E119 Type 2 diabetes mellitus without complications: Secondary | ICD-10-CM | POA: Diagnosis not present

## 2021-01-02 DIAGNOSIS — N184 Chronic kidney disease, stage 4 (severe): Secondary | ICD-10-CM | POA: Diagnosis not present

## 2021-01-02 DIAGNOSIS — I4891 Unspecified atrial fibrillation: Secondary | ICD-10-CM | POA: Diagnosis not present

## 2021-01-02 DIAGNOSIS — E7800 Pure hypercholesterolemia, unspecified: Secondary | ICD-10-CM | POA: Diagnosis not present

## 2021-01-02 MED ORDER — TORSEMIDE 20 MG PO TABS
60.0000 mg | ORAL_TABLET | Freq: Two times a day (BID) | ORAL | 3 refills | Status: DC
Start: 2021-01-02 — End: 2021-03-11

## 2021-01-25 ENCOUNTER — Other Ambulatory Visit (HOSPITAL_COMMUNITY)
Admission: RE | Admit: 2021-01-25 | Discharge: 2021-01-25 | Disposition: A | Discharge status: Home / Self Care | Payer: PPO | Source: Ambulatory Visit | Attending: Physician Assistant | Admitting: Physician Assistant

## 2021-01-25 DIAGNOSIS — I872 Venous insufficiency (chronic) (peripheral): Secondary | ICD-10-CM | POA: Diagnosis not present

## 2021-01-25 DIAGNOSIS — Z20822 Contact with and (suspected) exposure to covid-19: Secondary | ICD-10-CM | POA: Diagnosis not present

## 2021-01-25 DIAGNOSIS — N183 Chronic kidney disease, stage 3 unspecified: Secondary | ICD-10-CM | POA: Diagnosis not present

## 2021-01-25 DIAGNOSIS — Z8249 Family history of ischemic heart disease and other diseases of the circulatory system: Secondary | ICD-10-CM | POA: Diagnosis not present

## 2021-01-25 DIAGNOSIS — G4733 Obstructive sleep apnea (adult) (pediatric): Secondary | ICD-10-CM | POA: Diagnosis not present

## 2021-01-25 DIAGNOSIS — E1122 Type 2 diabetes mellitus with diabetic chronic kidney disease: Secondary | ICD-10-CM | POA: Diagnosis not present

## 2021-01-25 DIAGNOSIS — R238 Other skin changes: Secondary | ICD-10-CM | POA: Diagnosis not present

## 2021-01-25 DIAGNOSIS — I878 Other specified disorders of veins: Secondary | ICD-10-CM | POA: Diagnosis not present

## 2021-01-25 DIAGNOSIS — Z6841 Body Mass Index (BMI) 40.0 and over, adult: Secondary | ICD-10-CM | POA: Diagnosis not present

## 2021-01-25 DIAGNOSIS — Z888 Allergy status to other drugs, medicaments and biological substances status: Secondary | ICD-10-CM | POA: Diagnosis not present

## 2021-01-25 DIAGNOSIS — I4510 Unspecified right bundle-branch block: Secondary | ICD-10-CM | POA: Diagnosis not present

## 2021-01-25 DIAGNOSIS — I4819 Other persistent atrial fibrillation: Secondary | ICD-10-CM | POA: Diagnosis not present

## 2021-01-25 DIAGNOSIS — I5033 Acute on chronic diastolic (congestive) heart failure: Secondary | ICD-10-CM | POA: Diagnosis not present

## 2021-01-25 DIAGNOSIS — E669 Obesity, unspecified: Secondary | ICD-10-CM | POA: Diagnosis not present

## 2021-01-25 DIAGNOSIS — I130 Hypertensive heart and chronic kidney disease with heart failure and stage 1 through stage 4 chronic kidney disease, or unspecified chronic kidney disease: Secondary | ICD-10-CM | POA: Diagnosis not present

## 2021-01-25 DIAGNOSIS — Z7901 Long term (current) use of anticoagulants: Secondary | ICD-10-CM | POA: Diagnosis not present

## 2021-01-25 DIAGNOSIS — Z01812 Encounter for preprocedural laboratory examination: Secondary | ICD-10-CM | POA: Insufficient documentation

## 2021-01-25 LAB — SARS CORONAVIRUS 2 (TAT 6-24 HRS): SARS Coronavirus 2: NEGATIVE

## 2021-01-28 ENCOUNTER — Ambulatory Visit (HOSPITAL_COMMUNITY)
Admission: RE | Admit: 2021-01-28 | Discharge: 2021-01-28 | Disposition: A | Discharge status: Home / Self Care | Payer: PPO | Source: Ambulatory Visit | Attending: Physician Assistant | Admitting: Physician Assistant

## 2021-01-28 ENCOUNTER — Other Ambulatory Visit: Payer: Self-pay

## 2021-01-28 ENCOUNTER — Inpatient Hospital Stay (HOSPITAL_COMMUNITY)
Admission: AD | Admit: 2021-01-28 | Discharge: 2021-01-31 | DRG: 308 | Disposition: A | Discharge status: Home / Self Care | Payer: PPO | Source: Ambulatory Visit | Attending: Cardiology | Admitting: Cardiology

## 2021-01-28 ENCOUNTER — Inpatient Hospital Stay: Admission: AD | Admit: 2021-01-28 | Payer: PPO | Source: Ambulatory Visit | Admitting: Cardiology

## 2021-01-28 ENCOUNTER — Encounter (HOSPITAL_COMMUNITY): Payer: Self-pay | Admitting: Cardiology

## 2021-01-28 VITALS — BP 132/70 | HR 95 | Ht 66.0 in

## 2021-01-28 DIAGNOSIS — I878 Other specified disorders of veins: Secondary | ICD-10-CM | POA: Diagnosis present

## 2021-01-28 DIAGNOSIS — I5033 Acute on chronic diastolic (congestive) heart failure: Secondary | ICD-10-CM | POA: Diagnosis present

## 2021-01-28 DIAGNOSIS — G4733 Obstructive sleep apnea (adult) (pediatric): Secondary | ICD-10-CM | POA: Diagnosis present

## 2021-01-28 DIAGNOSIS — Z7901 Long term (current) use of anticoagulants: Secondary | ICD-10-CM | POA: Diagnosis not present

## 2021-01-28 DIAGNOSIS — I130 Hypertensive heart and chronic kidney disease with heart failure and stage 1 through stage 4 chronic kidney disease, or unspecified chronic kidney disease: Secondary | ICD-10-CM | POA: Diagnosis not present

## 2021-01-28 DIAGNOSIS — Z888 Allergy status to other drugs, medicaments and biological substances status: Secondary | ICD-10-CM

## 2021-01-28 DIAGNOSIS — E669 Obesity, unspecified: Secondary | ICD-10-CM | POA: Diagnosis not present

## 2021-01-28 DIAGNOSIS — Z8249 Family history of ischemic heart disease and other diseases of the circulatory system: Secondary | ICD-10-CM

## 2021-01-28 DIAGNOSIS — I4510 Unspecified right bundle-branch block: Secondary | ICD-10-CM | POA: Diagnosis present

## 2021-01-28 DIAGNOSIS — I4819 Other persistent atrial fibrillation: Secondary | ICD-10-CM | POA: Diagnosis not present

## 2021-01-28 DIAGNOSIS — N183 Chronic kidney disease, stage 3 unspecified: Secondary | ICD-10-CM | POA: Diagnosis not present

## 2021-01-28 DIAGNOSIS — R238 Other skin changes: Secondary | ICD-10-CM | POA: Diagnosis present

## 2021-01-28 DIAGNOSIS — E1122 Type 2 diabetes mellitus with diabetic chronic kidney disease: Secondary | ICD-10-CM | POA: Diagnosis present

## 2021-01-28 DIAGNOSIS — I872 Venous insufficiency (chronic) (peripheral): Secondary | ICD-10-CM | POA: Diagnosis not present

## 2021-01-28 DIAGNOSIS — Z6841 Body Mass Index (BMI) 40.0 and over, adult: Secondary | ICD-10-CM | POA: Diagnosis not present

## 2021-01-28 DIAGNOSIS — Z20822 Contact with and (suspected) exposure to covid-19: Secondary | ICD-10-CM | POA: Diagnosis not present

## 2021-01-28 DIAGNOSIS — D6869 Other thrombophilia: Secondary | ICD-10-CM

## 2021-01-28 LAB — BASIC METABOLIC PANEL
Anion gap: 15 (ref 5–15)
BUN: 33 mg/dL — ABNORMAL HIGH (ref 8–23)
CO2: 29 mmol/L (ref 22–32)
Calcium: 9.2 mg/dL (ref 8.9–10.3)
Chloride: 96 mmol/L — ABNORMAL LOW (ref 98–111)
Creatinine, Ser: 1.77 mg/dL — ABNORMAL HIGH (ref 0.44–1.00)
GFR, Estimated: 30 mL/min — ABNORMAL LOW (ref >60)
Glucose, Bld: 141 mg/dL — ABNORMAL HIGH (ref 70–99)
Potassium: 4 mmol/L (ref 3.5–5.1)
Sodium: 140 mmol/L (ref 135–145)

## 2021-01-28 LAB — MAGNESIUM: Magnesium: 2 mg/dL (ref 1.7–2.4)

## 2021-01-28 MED ORDER — MONTELUKAST SODIUM 10 MG PO TABS
10.0000 mg | ORAL_TABLET | Freq: Every day | ORAL | Status: DC
  Administered 2021-01-28 – 2021-01-30 (×3): 10 mg via ORAL
  Filled 2021-01-28 (×3): qty 1

## 2021-01-28 MED ORDER — TORSEMIDE 20 MG PO TABS
60.0000 mg | ORAL_TABLET | Freq: Two times a day (BID) | ORAL | Status: DC
  Administered 2021-01-28 – 2021-01-30 (×4): 60 mg via ORAL
  Filled 2021-01-28 (×4): qty 3

## 2021-01-28 MED ORDER — SODIUM CHLORIDE 0.9 % IV SOLN: 250.0000 mL | INTRAVENOUS | Status: DC | PRN

## 2021-01-28 MED ORDER — DOFETILIDE 500 MCG PO CAPS
500.0000 ug | ORAL_CAPSULE | Freq: Two times a day (BID) | ORAL | Status: DC
  Administered 2021-01-28 – 2021-01-31 (×6): 500 ug via ORAL
  Filled 2021-01-28 (×6): qty 1

## 2021-01-28 MED ORDER — SPIRONOLACTONE 12.5 MG HALF TABLET
12.5000 mg | ORAL_TABLET | Freq: Every day | ORAL | Status: DC
  Administered 2021-01-29 – 2021-01-31 (×3): 12.5 mg via ORAL
  Filled 2021-01-28 (×3): qty 1

## 2021-01-28 MED ORDER — APIXABAN 5 MG PO TABS
5.0000 mg | ORAL_TABLET | Freq: Two times a day (BID) | ORAL | Status: DC
  Administered 2021-01-28 – 2021-01-31 (×6): 5 mg via ORAL
  Filled 2021-01-28 (×6): qty 1

## 2021-01-28 MED ORDER — SODIUM CHLORIDE 0.9% FLUSH
3.0000 mL | Freq: Two times a day (BID) | INTRAVENOUS | Status: DC
  Administered 2021-01-28 – 2021-01-31 (×6): 3 mL via INTRAVENOUS

## 2021-01-28 MED ORDER — ROSUVASTATIN CALCIUM 5 MG PO TABS
5.0000 mg | ORAL_TABLET | Freq: Every day | ORAL | Status: DC
  Administered 2021-01-28 – 2021-01-30 (×3): 5 mg via ORAL
  Filled 2021-01-28 (×3): qty 1

## 2021-01-28 MED ORDER — LEVOTHYROXINE SODIUM 100 MCG PO TABS
100.0000 ug | ORAL_TABLET | Freq: Every day | ORAL | Status: DC
  Administered 2021-01-29 – 2021-01-31 (×3): 100 ug via ORAL
  Filled 2021-01-28 (×3): qty 1

## 2021-01-28 MED ORDER — OCUVITE-LUTEIN PO CAPS
1.0000 | ORAL_CAPSULE | Freq: Two times a day (BID) | ORAL | Status: DC
  Administered 2021-01-28 – 2021-01-29 (×2): 1 via ORAL
  Filled 2021-01-28 (×3): qty 1

## 2021-01-28 MED ORDER — LORATADINE 10 MG PO TABS
10.0000 mg | ORAL_TABLET | Freq: Every day | ORAL | Status: DC
  Administered 2021-01-28 – 2021-01-30 (×3): 10 mg via ORAL
  Filled 2021-01-28 (×3): qty 1

## 2021-01-28 MED ORDER — DULOXETINE HCL 60 MG PO CPEP
90.0000 mg | ORAL_CAPSULE | Freq: Every day | ORAL | Status: DC
  Administered 2021-01-29 – 2021-01-31 (×3): 90 mg via ORAL
  Filled 2021-01-28 (×3): qty 1

## 2021-01-28 MED ORDER — PANTOPRAZOLE SODIUM 40 MG PO TBEC
40.0000 mg | DELAYED_RELEASE_TABLET | Freq: Every day | ORAL | Status: DC
  Administered 2021-01-28 – 2021-01-31 (×4): 40 mg via ORAL
  Filled 2021-01-28 (×4): qty 1

## 2021-01-28 MED ORDER — POTASSIUM CHLORIDE ER 10 MEQ PO TBCR
20.0000 meq | EXTENDED_RELEASE_TABLET | Freq: Two times a day (BID) | ORAL | Status: DC
  Administered 2021-01-28 – 2021-01-31 (×6): 20 meq via ORAL
  Filled 2021-01-28 (×14): qty 2

## 2021-01-28 MED ORDER — SODIUM CHLORIDE 0.9% FLUSH: 3.0000 mL | INTRAVENOUS | Status: DC | PRN

## 2021-01-28 MED ORDER — ZOLPIDEM TARTRATE 5 MG PO TABS
5.0000 mg | ORAL_TABLET | Freq: Every evening | ORAL | Status: DC | PRN
  Administered 2021-01-29 – 2021-01-30 (×2): 5 mg via ORAL
  Filled 2021-01-28 (×2): qty 1

## 2021-01-28 MED ORDER — BUSPIRONE HCL 5 MG PO TABS
15.0000 mg | ORAL_TABLET | Freq: Three times a day (TID) | ORAL | Status: DC
  Administered 2021-01-28 – 2021-01-31 (×9): 15 mg via ORAL
  Filled 2021-01-28 (×9): qty 3

## 2021-01-28 MED ORDER — LEVOCETIRIZINE DIHYDROCHLORIDE 5 MG PO TABS: 5.0000 mg | ORAL_TABLET | Freq: Every day | ORAL | Status: DC

## 2021-01-28 NOTE — Progress Notes (Signed)
Primary Care Physician: Lujean Amel, MD Referring Physician: Dr. Curt Bears  AHF- Dr. Yvonne Kendall Hannah [name] is a 75 y.o. female with a h/o persistent afib, chronic diastolic HF, DM, that is in the afib clinic for f/u ablation one month ago. Unfortunately, she is in afib,rate controlled. She was unaware.  Discussed with pt that she would require cardioversion. She was very upset and crying over the news. I will increase amiodarone to 200 mg bid until time of cardioversion. She states no missed eliquis 5 mg bid. She  has had covid shots.  Return to afib clinic, 06/26/20. She did have successful cardioversion but unfortunately, she is in afib today, rate controlled. She will lower Amiodarone back to 200 mg daily. I spoke to Dr. Shonna Chock and he suggested that I could offer her another cardioversion which the pt is not willingly to do at this point. He felt that her chances of returning to SR were lower than optimal going into procedure. She is also having some financial  concerns and I asked Kennyth Lose, SW with [facility] to speak to her and her daughter today.   Follow up in the AF clinic 01/28/21. Patient presents for dofetilide admission. She is in rate controlled afib with symptoms of SOB and fatigue. She denies any missed doses of anticoagulation in the last 3 weeks.   Today, she denies symptoms of palpitations, chest pain, orthopnea, PND, dizziness, presyncope, syncope, or neurologic sequela. The patient is tolerating medications without difficulties and is otherwise without complaint today.   Past Medical History:  Diagnosis Date   Atrial fibrillation ([place])    Diabetes mellitus without complication ([place])    Glaucoma    BILATERAL   Hypertension    Thyroid disease    Volume overload 11/2019   Past Surgical History:  Procedure Laterality Date   APPENDECTOMY     ATRIAL FIBRILLATION ABLATION N/A 05/04/2020   Procedure: ATRIAL FIBRILLATION ABLATION;  Surgeon: Constance Haw, MD;   Location: [place] CV LAB;  Service: Cardiovascular;  Laterality: N/A;   CARDIOVERSION  11/24/2019   CARDIOVERSION N/A 11/24/2019   Procedure: CARDIOVERSION;  Surgeon: Larey Dresser, MD;  Location: [facility] ENDOSCOPY;  Service: Cardiovascular;  Laterality: N/A;   CARDIOVERSION N/A 11/29/2019   Procedure: CARDIOVERSION;  Surgeon: Larey Dresser, MD;  Location: [facility] ENDOSCOPY;  Service: Cardiovascular;  Laterality: N/A;   CARDIOVERSION N/A 06/19/2020   Procedure: CARDIOVERSION;  Surgeon: Dorothy Spark, MD;  Location: [facility] ENDOSCOPY;  Service: Cardiovascular;  Laterality: N/A;   CHOLECYSTECTOMY     HERNIA REPAIR     UMBILICAL   LASIK     BILATERAL   TEE WITHOUT CARDIOVERSION N/A 11/24/2019   Procedure: TRANSESOPHAGEAL ECHOCARDIOGRAM (TEE);  Surgeon: Larey Dresser, MD;  Location: [facility] ENDOSCOPY;  Service: Cardiovascular;  Laterality: N/A;   TEE WITHOUT CARDIOVERSION N/A 05/03/2020   Procedure: TRANSESOPHAGEAL ECHOCARDIOGRAM (TEE);  Surgeon: Donato Heinz, MD;  Location: [facility] ENDOSCOPY;  Service: Cardiovascular;  Laterality: N/A;    Current Outpatient Medications  Medication Sig Dispense Refill   apixaban (ELIQUIS) 5 MG TABS tablet Take 1 tablet (5 mg total) by mouth 2 (two) times daily. 180 tablet 1   busPIRone (BUSPAR) 15 MG tablet TAKE 1 TABLET THREE TIMES A DAY 270 tablet 1   DULoxetine (CYMBALTA) 30 MG capsule Take 3 capsules (90 mg total) by mouth daily. 270 capsule 1   esomeprazole (NEXIUM) 40 MG capsule Take 40 mg by mouth as needed (indigestion/heartburn).  levocetirizine (XYZAL) 5 MG tablet Take 5 mg by mouth at bedtime.      metFORMIN (GLUCOPHAGE) 500 MG tablet Take 500 mg by mouth daily.     Misc. Devices (BARIATRIC ROLLATOR) MISC For home use 1 each 0   montelukast (SINGULAIR) 10 MG tablet Take 10 mg by mouth at bedtime.      Multiple Vitamins-Minerals (PRESERVISION AREDS 2 PO) Take 1 tablet by mouth in the morning and at bedtime.     NYAMYC powder Apply 1  application topically as needed (rash under breasts).      Potassium Chloride ER 20 MEQ TBCR Take 20 mEq by mouth in the morning and at bedtime. May take an additional as needed with diuretic. Please call office for appointment [phone] 200 tablet 1   rosuvastatin (CRESTOR) 5 MG tablet Take 5 mg by mouth daily.     spironolactone (ALDACTONE) 25 MG tablet TAKE 1/2 TABLET BY MOUTH DAILY 45 tablet 10   torsemide (DEMADEX) 20 MG tablet Take 3 tablets (60 mg total) by mouth 2 (two) times daily. 180 tablet 3   triamcinolone cream (KENALOG) 0.1 % Apply 1 application topically daily. Applied to legs     zolpidem (AMBIEN) 10 MG tablet Take 10 mg by mouth at bedtime as needed.     levothyroxine (SYNTHROID, LEVOTHROID) 75 MCG tablet Take 75 mcg by mouth daily before breakfast.      No current facility-administered medications for this encounter.    Allergies  Allergen Reactions   Azithromycin Rash and Other (See Comments)    Severe rash    Social History   Socioeconomic History   Marital status: Legally Separated    Spouse name: Not on file   Number of children: Not on file   Years of education: Not on file   Highest education level: Not on file  Occupational History   Not on file  Tobacco Use   Smoking status: Never   Smokeless tobacco: Never  Vaping Use   Vaping Use: Never used  Substance and Sexual Activity   Alcohol use: Yes    Alcohol/week: 1.0 standard drink    Types: 1 Glasses of wine per week    Comment: occ   Drug use: No   Sexual activity: Not Currently  Other Topics Concern   Not on file  Social History Narrative   Not on file   Social Determinants of Health   Financial Resource Strain: Not on file  Food Insecurity: Not on file  Transportation Needs: Not on file  Physical Activity: Not on file  Stress: Not on file  Social Connections: Not on file  Intimate Partner Violence: Not on file    Family History  Problem Relation Age of Onset   Lung cancer Mother     Colon cancer Father    Heart attack Brother    Heart disease Brother    Lung cancer Maternal Grandfather    Heart attack Paternal Grandfather     ROS- All systems are reviewed and negative except as per the HPI above  Physical Exam: Vitals:   01/28/21 1138  BP: 132/70  Pulse: 95  Height: '5\' 6"'$  (1.676 m)    Wt Readings from Last 3 Encounters:  10/31/20 (!) 138.3 kg  07/24/20 127.9 kg  06/19/20 127.9 kg    Labs: Lab Results  Component Value Date   NA 139 10/31/2020   K 3.8 10/31/2020   CL 93 (L) 10/31/2020   CO2 32 (H) 10/31/2020  GLUCOSE 103 (H) 10/31/2020   BUN 42 (H) 10/31/2020   CREATININE 1.61 (H) 10/31/2020   CALCIUM 9.5 10/31/2020   MG 2.2 11/26/2019   Lab Results  Component Value Date   INR 2.9 03/20/2020   No results found for: CHOL, HDL, LDLCALC, TRIG   GEN- The patient is a well appearing obese female, alert and oriented x 3 today.   HEENT-head normocephalic, atraumatic, sclera clear, conjunctiva pink, hearing intact, trachea midline. Lungs- Clear to ausculation bilaterally, normal work of breathing Heart- irregular rate and rhythm, no murmurs, rubs or gallops  GI- soft, NT, ND, + BS Extremities- no clubbing, cyanosis. 1+ bilateral LEE with chronic venous stasis dermatitis.  MS- no significant deformity or atrophy Skin- no rash or lesion Psych- euthymic mood, full affect Neuro- strength and sensation are intact   EKG- afib, NST, RBBB Vent. rate 95 BPM PR interval * ms QRS duration 124 ms QT/QTcB 370/464 ms   Assessment and Plan: 1. Persistent atrial fibrillation  S/p ablation 05/04/20 Patient presents for dofetilide admission. Patient aware of price of dofetilide. Continue Eliquis 5 mg BID, states no missed doses in the last 3 weeks. No recent benadryl use PharmD has screened medications. Metolazone and trazodone have been discontinued.  QTc in SR 450s ms when correcting for RBBB Labs today show creatinine at 1.77, K+ 4.0 and mag  2.0, CrCl calculated at 62 mL/min  2. CHA2DS2VAScore at least 4 Continue  eliquis 5 mg bid   3. Chronic diastolic heart failure  No signs or symptoms of fluid overload. Patient refused to weigh today.  4. OSA The importance of adequate treatment of sleep apnea was discussed today in order to improve our ability to maintain sinus rhythm long term. Encouraged compliance with CPAP therapy.   To be admitted later today once a bed becomes available.    [facility] [address] [place], [place] [address] [phone]

## 2021-01-28 NOTE — H&P (Addendum)
Electrophysiology H&P  Note   Primary Care Physician: Lujean Amel, MD Referring Physician: Dr. Curt Bears  AHF- Dr. Yvonne Kendall Hannah [name] is a 75 y.o. female with a h/o persistent afib, chronic diastolic HF, DM, that is in the afib clinic for f/u ablation one month ago. Unfortunately, she is in afib,rate controlled. She was unaware.  Discussed with pt that she would require cardioversion. She was very upset and crying over the news. I will increase amiodarone to 200 mg bid until time of cardioversion. She states no missed eliquis 5 mg bid. She  has had covid shots.  Return to afib clinic, 06/26/20. She did have successful cardioversion but unfortunately, she is in afib today, rate controlled. She will lower Amiodarone back to 200 mg daily. I spoke to Dr. Shonna Chock and he suggested that I could offer her another cardioversion which the pt is not willingly to do at this point. He felt that her chances of returning to SR were lower than optimal going into procedure. She is also having some financial  concerns and I asked Kennyth Lose, SW with [facility] to speak to her and her daughter today.   Follow up in the AF clinic 01/28/21. Patient presents for dofetilide admission. She is in rate controlled afib with symptoms of SOB and fatigue. She denies any missed doses of anticoagulation in the last 3 weeks.   Today, she denies symptoms of palpitations, chest pain, orthopnea, PND, dizziness, presyncope, syncope, or neurologic sequela. The patient is tolerating medications without difficulties and is otherwise without complaint today.   Past Medical History:  Diagnosis Date   Atrial fibrillation ([place])    Diabetes mellitus without complication ([place])    Glaucoma    BILATERAL   Hypertension    Thyroid disease    Volume overload 11/2019   Past Surgical History:  Procedure Laterality Date   APPENDECTOMY     ATRIAL FIBRILLATION ABLATION N/A 05/04/2020   Procedure: ATRIAL FIBRILLATION ABLATION;  Surgeon:  Constance Haw, MD;  Location: [place] CV LAB;  Service: Cardiovascular;  Laterality: N/A;   CARDIOVERSION  11/24/2019   CARDIOVERSION N/A 11/24/2019   Procedure: CARDIOVERSION;  Surgeon: Larey Dresser, MD;  Location: [facility] ENDOSCOPY;  Service: Cardiovascular;  Laterality: N/A;   CARDIOVERSION N/A 11/29/2019   Procedure: CARDIOVERSION;  Surgeon: Larey Dresser, MD;  Location: [facility] ENDOSCOPY;  Service: Cardiovascular;  Laterality: N/A;   CARDIOVERSION N/A 06/19/2020   Procedure: CARDIOVERSION;  Surgeon: Dorothy Spark, MD;  Location: [facility] ENDOSCOPY;  Service: Cardiovascular;  Laterality: N/A;   CHOLECYSTECTOMY     HERNIA REPAIR     UMBILICAL   LASIK     BILATERAL   TEE WITHOUT CARDIOVERSION N/A 11/24/2019   Procedure: TRANSESOPHAGEAL ECHOCARDIOGRAM (TEE);  Surgeon: Larey Dresser, MD;  Location: [facility] ENDOSCOPY;  Service: Cardiovascular;  Laterality: N/A;   TEE WITHOUT CARDIOVERSION N/A 05/03/2020   Procedure: TRANSESOPHAGEAL ECHOCARDIOGRAM (TEE);  Surgeon: Donato Heinz, MD;  Location: [facility] ENDOSCOPY;  Service: Cardiovascular;  Laterality: N/A;    No current facility-administered medications for this encounter.    Allergies  Allergen Reactions   Azithromycin Rash and Other (See Comments)    Severe rash    Social History   Socioeconomic History   Marital status: Legally Separated    Spouse name: Not on file   Number of children: Not on file   Years of education: Not on file   Highest education level: Not on file  Occupational History   Not on  file  Tobacco Use   Smoking status: Never   Smokeless tobacco: Never  Vaping Use   Vaping Use: Never used  Substance and Sexual Activity   Alcohol use: Yes    Alcohol/week: 1.0 standard drink    Types: 1 Glasses of wine per week    Comment: occ   Drug use: No   Sexual activity: Not Currently  Other Topics Concern   Not on file  Social History Narrative   Not on file   Social Determinants of Health    Financial Resource Strain: Not on file  Food Insecurity: Not on file  Transportation Needs: Not on file  Physical Activity: Not on file  Stress: Not on file  Social Connections: Not on file  Intimate Partner Violence: Not on file    Family History  Problem Relation Age of Onset   Lung cancer Mother    Colon cancer Father    Heart attack Brother    Heart disease Brother    Lung cancer Maternal Grandfather    Heart attack Paternal Grandfather     ROS- All systems are reviewed and negative except as per the HPI above  Physical Exam: There were no vitals filed for this visit.   Wt Readings from Last 3 Encounters:  10/31/20 (!) 138.3 kg  07/24/20 127.9 kg  06/19/20 127.9 kg    Labs: Lab Results  Component Value Date   NA 140 01/28/2021   K 4.0 01/28/2021   CL 96 (L) 01/28/2021   CO2 29 01/28/2021   GLUCOSE 141 (H) 01/28/2021   BUN 33 (H) 01/28/2021   CREATININE 1.77 (H) 01/28/2021   CALCIUM 9.2 01/28/2021   MG 2.0 01/28/2021   Lab Results  Component Value Date   INR 2.9 03/20/2020   No results found for: CHOL, HDL, LDLCALC, TRIG   GEN- The patient is a well appearing obese female, alert and oriented x 3 today.   HEENT-head normocephalic, atraumatic, sclera clear, conjunctiva pink, hearing intact, trachea midline. Lungs- Clear to ausculation bilaterally, normal work of breathing Heart- irregular rate and rhythm, no murmurs, rubs or gallops  GI- soft, NT, ND, + BS Extremities- no clubbing, cyanosis. 1+ bilateral LEE with chronic venous stasis dermatitis.  MS- no significant deformity or atrophy Skin- no rash or lesion Psych- euthymic mood, full affect Neuro- strength and sensation are intact   EKG- afib, NST, RBBB Vent. rate 95 BPM PR interval * ms QRS duration 124 ms QT/QTcB 370/464 ms   Assessment and Plan: 1. Persistent atrial fibrillation  S/p ablation 05/04/20 Patient presents for dofetilide admission. Patient aware of price of  dofetilide. Continue Eliquis 5 mg BID, states no missed doses in the last 3 weeks. No recent benadryl use PharmD has screened medications. Metolazone and trazodone have been discontinued.  QTc in SR 450s ms when correcting for RBBB Labs today show creatinine at 1.77, K+ 4.0 and mag 2.0, CrCl calculated at 62 mL/min  2. CHA2DS2VAScore at least 4 Continue  eliquis 5 mg bid   3. Chronic diastolic heart failure  No signs or symptoms of fluid overload. Patient refused to weigh today.  4. OSA The importance of adequate treatment of sleep apnea was discussed today in order to improve our ability to maintain sinus rhythm long term. Encouraged compliance with CPAP therapy.   She presents today for tikosyn admission. Labs OK. Borderline candidate for dose reduction. Will follow renal function closely.   Legrand Como [address] [place], PA-C  01/28/2021 3:55 PM  EP Attending  Patient seen and examined. Agree with the findings as noted above. The patient presents today for initiation of dofetilide. She has a long h/o persistent atrial fib and obesity and has undergone multiple DCCV's and catheter ablatio with PVI of her atrial fib. She has been off of her amio for many months. She presents for dofetilide initiation. Her other medical problems are well described as above. The patient is an obese 75 yo woman with an IRIR rhythm and her lungs demonstrate no increased work of breathing. Her ECG has been reviewed and her corrected QT is around 440 by my calculation. She may start dofetilide based on her renal function and weight and age. She will undergo DCCV on Wednesday if she has not already converted.   Carleene Overlie Cashmere Ding[place],MD

## 2021-01-28 NOTE — Progress Notes (Signed)
Discussed with Dr Curt Bears.  Will use actual body weight for CrCl of 61.8 and start with 500 mcg dosing.  Follow closely.  Legrand Como [address] [place], [place]

## 2021-01-28 NOTE — Progress Notes (Signed)
Pharmacy Review for Dofetilide (Tikosyn) Initiation  Admit Complaint: 75 y.o. female admitted 01/28/2021 with atrial fibrillation to be initiated on dofetilide.   Assessment:  Patient Exclusion Criteria: If any screening criteria checked as "Yes", then  patient  should NOT receive dofetilide until criteria item is corrected. If "Yes" please indicate correction plan.  YES  NO Patient  Exclusion Criteria Correction Plan  '[x]'$  '[]'$  Baseline QTc interval is greater than or equal to 440 msec. IF above YES box checked dofetilide contraindicated unless patient has ICD; then may proceed if QTc 500-550 msec or with known ventricular conduction abnormalities may proceed with QTc 550-600 msec. QTc =  464 MD aware  '[]'$  '[x]'$  Magnesium level is less than 1.8 mEq/l : Last magnesium:  Lab Results  Component Value Date   MG 2.0 01/28/2021         '[]'$  '[x]'$  Potassium level is less than 4 mEq/l : Last potassium:  Lab Results  Component Value Date   K 4.0 01/28/2021         '[]'$  '[x]'$  Patient is known or suspected to have a digoxin level greater than 2 ng/ml: No results found for: DIGOXIN    '[]'$  '[x]'$  Creatinine clearance less than 20 ml/min (calculated using Cockcroft-Gault, actual body weight and serum creatinine): Estimated Creatinine Clearance: 40.6 mL/min (A) (by C-G formula based on SCr of 1.77 mg/dL (H)).    '[x]'$  '[]'$  Patient has received drugs known to prolong the QT intervals within the last 48 hours (phenothiazines, tricyclics or tetracyclic antidepressants, erythromycin, H-1 antihistamines, cisapride, fluoroquinolones, azithromycin). Drugs not listed above may have an, as yet, undetected potential to prolong the QT interval, updated information on QT prolonging agents is available at this website:QT prolonging agents   '[]'$  '[x]'$  Patient received a dose of hydrochlorothiazide (Oretic) alone or in any combination including triamterene (Dyazide, Maxzide) in the last 48 hours.   '[]'$  '[x]'$  Patient received a medication  known to increase dofetilide plasma concentrations prior to initial dofetilide dose:  Trimethoprim (Primsol, Proloprim) in the last 36 hours Verapamil (Calan, Verelan) in the last 36 hours or a sustained release dose in the last 72 hours Megestrol (Megace) in the last 5 days  Cimetidine (Tagamet) in the last 6 hours Ketoconazole (Nizoral) in the last 24 hours Itraconazole (Sporanox) in the last 48 hours  Prochlorperazine (Compazine) in the last 36 hours    '[]'$  '[x]'$  Patient is known to have a history of torsades de pointes; congenital or acquired long QT syndromes.   '[]'$  '[x]'$  Patient has received a Class 1 antiarrhythmic with less than 2 half-lives since last dose. (Disopyramide, Quinidine, Procainamide, Lidocaine, Mexiletine, Flecainide, Propafenone)   '[]'$  '[x]'$  Patient has received amiodarone therapy in the past 3 months or amiodarone level is greater than 0.3 ng/ml.    Patient has been appropriately anticoagulated with Apixaban.  Ordering provider was confirmed at [email or web address] if they are not listed on the [place] Prescribers list.  Goal of Therapy: Follow renal function, electrolytes, potential drug interactions, and dose adjustment. Provide education and 1 week supply at discharge.  Plan:  '[x]'$   Physician selected initial dose within range recommended for patients level of renal function - will monitor for response.  '[]'$   Physician selected initial dose outside of range recommended for patients level of renal function - will discuss if the dose should be altered at this time.   Select One Calculated CrCl  Dose q12h  '[x]'$  > 60 ml/min 500 mcg  '[]'$  40-60 ml/min 250  mcg  '[]'$  20-40 ml/min 125 mcg   2. Follow up QTc after the first 5 doses, renal function, electrolytes (K & Mg) daily x 3     days, dose adjustment, success of initiation and facilitate 1 week discharge supply as     clinically indicated.  3. Initiate Tikosyn education video (Call [phone] and ask for Tikosyn Video #  [id number]).  4. Place Enrollment Form on the chart for discharge supply of dofetilide.   Corinda Gubler 4:29 PM 01/28/2021

## 2021-01-28 NOTE — Progress Notes (Signed)
Spoke with pharmacist, ambient should be safe with tikosyn, however pharmacist recommend 5 mg instead of the home dose of 10 mg given her age >86.

## 2021-01-29 LAB — BASIC METABOLIC PANEL
Anion gap: 9 (ref 5–15)
BUN: 34 mg/dL — ABNORMAL HIGH (ref 8–23)
CO2: 29 mmol/L (ref 22–32)
Calcium: 9.1 mg/dL (ref 8.9–10.3)
Chloride: 101 mmol/L (ref 98–111)
Creatinine, Ser: 1.61 mg/dL — ABNORMAL HIGH (ref 0.44–1.00)
GFR, Estimated: 34 mL/min — ABNORMAL LOW (ref >60)
Glucose, Bld: 131 mg/dL — ABNORMAL HIGH (ref 70–99)
Potassium: 3.7 mmol/L (ref 3.5–5.1)
Sodium: 139 mmol/L (ref 135–145)

## 2021-01-29 LAB — MAGNESIUM: Magnesium: 2.1 mg/dL (ref 1.7–2.4)

## 2021-01-29 LAB — BRAIN NATRIURETIC PEPTIDE: B Natriuretic Peptide: 102.1 pg/mL — ABNORMAL HIGH (ref 0.0–100.0)

## 2021-01-29 MED ORDER — POTASSIUM CHLORIDE CRYS ER 20 MEQ PO TBCR
40.0000 meq | EXTENDED_RELEASE_TABLET | Freq: Once | ORAL | Status: AC
  Administered 2021-01-29: 40 meq via ORAL
  Filled 2021-01-29: qty 2

## 2021-01-29 MED ORDER — PROSIGHT PO TABS
1.0000 | ORAL_TABLET | Freq: Two times a day (BID) | ORAL | Status: DC
  Administered 2021-01-29 – 2021-01-31 (×4): 1 via ORAL
  Filled 2021-01-29 (×4): qty 1

## 2021-01-29 NOTE — Care Management (Signed)
1145 01-29-21 Patient presented for Tikosyn Load. Benefits check submitted and Case Manager will follow for cost and pharmacy of choice.

## 2021-01-29 NOTE — Progress Notes (Signed)
Pharmacy: Dofetilide (Tikosyn) - Follow Up Assessment and Electrolyte Replacement  Pharmacy consulted to assist in monitoring and replacing electrolytes in this 75 y.o. female admitted on 01/28/2021 undergoing dofetilide initiation. First dofetilide dose: 581mg Q12 hr  Labs:    Component Value Date/Time   K 3.7 01/29/2021 0143   MG 2.1 01/29/2021 0143     Plan: Potassium: K 3.5-3.7:  Give KCl 60 mEq po x1   > 40 mEq already given by team plus on scheduled 20 BID. Will not give additional doses   Magnesium: Mg > 2: No additional supplementation needed     Thank you for allowing pharmacy to participate in this patient's care   LBenetta Spar PharmD, BCPS, BHoytPharmacist  Please check AMION for all MBethanyphone numbers After 10:00 PM, call [place]. [phone]

## 2021-01-29 NOTE — TOC Benefit Eligibility Note (Signed)
Transition of Care [facility]) Benefit Eligibility Note    Patient Details  Name: Hannah [name] MRN: [id number] Date of Birth: Feb 23, 1946   Medication/Dose: DOFETILIDE 500 MCG BID  CO-PAY- $1.25    250 MCG BID  CO-PAY- $1.25   125 MCG BID  CO-PAY- $1.25  Covered?: Yes  Tier:  (TIER- 4 DRUG)  Prescription Coverage Preferred Pharmacy: [facility]  , [place] with Person/Company/Phone Number:: JULIANNA  @  [employer] [id number] #  [phone]  Co-Pay: $1.25  Prior Approval: No  Deductible:  (NO DEDUCTIBLE WITH PLAN)  Additional Notes: TIKOSYN  : NOT COVER / [place] #  [id number]    Memory Argue Phone Number: 01/29/2021, 2:11 PM

## 2021-01-29 NOTE — Progress Notes (Signed)
Morning EKG reviewed  Shows has converted to NSR at 62 bpm with stable QTc at ~472 ms.  Continue Tikosyn 500 mcg BID.   Pt will not require DCCV  Pt did have some nausea and asked for Zofran. Explained issue with potential QTc prolongation with Zofran.  On my exam she has had crackers and ginger ale and is feeling better already.  It's possible she felt odd when she converted.  Currently asymptomatic.   Shirley Friar, PA-C  Pager: [phone]  01/29/2021 12:18 PM

## 2021-01-29 NOTE — Progress Notes (Addendum)
Electrophysiology Rounding Note  Patient Name: Hannah [name] Date of Encounter: 01/29/2021  Primary Cardiologist: Zyaire Mccleod Meredith Leeds, MD  Heart Failure: Dr. Aundra Dubin Electrophysiologist: Dr. Curt Bears   Subjective   Pt remains in afib on Tikosyn 500 mcg BID   QTc from EKG last pm shows stable QTc at ~440-460 when measured manually. (Qt ~ 360-380)  Very anxious that her medication times are different in the hospital (specifically re: Eliquis).  She confirms she has NOT missed a dose.   Inpatient Medications    Scheduled Meds:  apixaban  5 mg Oral BID   busPIRone  15 mg Oral TID   dofetilide  500 mcg Oral BID   DULoxetine  90 mg Oral Daily   levothyroxine  100 mcg Oral Q0600   loratadine  10 mg Oral QHS   montelukast  10 mg Oral QHS   multivitamin-lutein  1 capsule Oral BID   pantoprazole  40 mg Oral Daily   potassium chloride  20 mEq Oral BID   rosuvastatin  5 mg Oral QHS   sodium chloride flush  3 mL Intravenous Q12H   spironolactone  12.5 mg Oral Daily   torsemide  60 mg Oral BID   Continuous Infusions:  sodium chloride     PRN Meds: sodium chloride, sodium chloride flush, zolpidem   Vital Signs    Vitals:   01/28/21 1611 01/28/21 2016 01/29/21 0558  BP: (!) 143/69 (!) 122/53 117/79  Pulse: 90 86 94  Resp: 16 16   Temp:  98.5 F (36.9 C)   TempSrc:  Oral   SpO2: 100% 100% 97%  Weight: (!) 138.3 kg    Height: '5\' 6"'$  (1.676 m)     No intake or output data in the 24 hours ending 01/29/21 0705 Filed Weights   01/28/21 1611  Weight: (!) 138.3 kg    Physical Exam    GEN- The patient is well appearing, alert and oriented x 3 today.   Head- normocephalic, atraumatic Eyes-  Sclera clear, conjunctiva pink Ears- hearing intact Oropharynx- clear Neck- supple Lungs- Clear to ausculation bilaterally, normal work of breathing Heart- Irregularly irregular rate and rhythm, no murmurs, rubs or gallops GI- soft, NT, ND, + BS Extremities- no clubbing,  cyanosis, or edema Skin- no rash or lesion Psych- euthymic mood, full affect Neuro- strength and sensation are intact  Labs    CBC No results for input(s): WBC, NEUTROABS, HGB, HCT, MCV, PLT in the last 72 hours. Basic Metabolic Panel Recent Labs    01/28/21 1209 01/29/21 0143  NA 140 139  K 4.0 3.7  CL 96* 101  CO2 29 29  GLUCOSE 141* 131*  BUN 33* 34*  CREATININE 1.77* 1.61*  CALCIUM 9.2 9.1  MG 2.0 2.1    Potassium  Date/Time Value Ref Range Status  01/29/2021 01:43 AM 3.7 3.5 - 5.1 mmol/L Final   Magnesium  Date/Time Value Ref Range Status  01/29/2021 01:43 AM 2.1 1.7 - 2.4 mg/dL Final    Comment:    Performed at [facility], [place] [address], [place], [place] [address]    Telemetry    Atrial fibrillation 90-120s (personally reviewed)  Radiology    No results found.   Patient Profile     Hannah [name] is a 75 y.o. female with a past medical history significant for persistent atrial fibrillation.  They were admitted for tikosyn load.   Assessment & Plan    Persistent atrial fibrillation Pt remains in afib on  Tikosyn 500 mcg BID  Continue Eliquis Mg 2.1. K 3.7. Hannah [name] supp K CHA2DS2VASC is at least 4.  2. Chronic diastolic CHF Volume status OK.  Follow closely having recently stopped metolazone for tikosyn initiation.   3. CKD III Borderline for dose adjustment. Follow closely.  Cr of 1.83 is her threshold for dose decrease based on her current "actual" weight.   If pt does not convert chemically, plan on DCCV tomorrow   For questions or updates, please contact [place] Please consult [email or web address] for contact info under Cardiology/STEMI.  Signed, Shirley Friar, PA-C  01/29/2021, 7:05 AM   I have seen and examined this patient with Oda Kilts.  Agree with above, note added to reflect my findings.  On exam, irregular, no mumurs.  QTc remains stable. Continue tikosyn at the current dose. Plan for DCCV tomorrow after  the 4th dose.  Witten Certain M. Dynisha Due MD 01/29/2021 7:37 AM

## 2021-01-29 NOTE — Progress Notes (Signed)
Pt set up on CPAP with full face mask this is patients home regimen.  Explained to patient how the mask can be quickly and easily removed her concern was getting up for restroom.  Pt states she understands and mask feels fine.

## 2021-01-29 NOTE — Progress Notes (Signed)
Visit made to patients room to deliver CPAP machine.  Advised patient that I would be back later to place her on it and go over precaution measures of how to remove the mask etc.

## 2021-01-30 LAB — BASIC METABOLIC PANEL
Anion gap: 10 (ref 5–15)
BUN: 33 mg/dL — ABNORMAL HIGH (ref 8–23)
CO2: 28 mmol/L (ref 22–32)
Calcium: 9 mg/dL (ref 8.9–10.3)
Chloride: 100 mmol/L (ref 98–111)
Creatinine, Ser: 1.71 mg/dL — ABNORMAL HIGH (ref 0.44–1.00)
GFR, Estimated: 31 mL/min — ABNORMAL LOW (ref >60)
Glucose, Bld: 141 mg/dL — ABNORMAL HIGH (ref 70–99)
Potassium: 4.3 mmol/L (ref 3.5–5.1)
Sodium: 138 mmol/L (ref 135–145)

## 2021-01-30 LAB — MAGNESIUM: Magnesium: 2.1 mg/dL (ref 1.7–2.4)

## 2021-01-30 MED ORDER — FUROSEMIDE 10 MG/ML IJ SOLN
80.0000 mg | Freq: Once | INTRAMUSCULAR | Status: AC
  Administered 2021-01-30: 80 mg via INTRAVENOUS
  Filled 2021-01-30: qty 8

## 2021-01-30 NOTE — Progress Notes (Signed)
Orthopedic Tech Progress Note Patient Details:  Hannah [name] 09-12-1945 [id number]  Ortho Devices Type of Ortho Device: Haematologist Ortho Device/Splint Location: Bi LE Ortho Device/Splint Interventions: Application   Post Interventions Patient Tolerated: Well  [name] [name] 01/30/2021, 3:43 PM

## 2021-01-30 NOTE — Progress Notes (Signed)
Pharmacy: Dofetilide (Tikosyn) - Follow Up Assessment and Electrolyte Replacement  Pharmacy consulted to assist in monitoring and replacing electrolytes in this 75 y.o. female admitted on 01/28/2021 undergoing dofetilide initiation. First dofetilide dose: 7/18 pm  Labs:    Component Value Date/Time   K 4.3 01/30/2021 0109   MG 2.1 01/30/2021 0109     Plan: Potassium: K >/= 4: No additional supplementation needed- on 20 mEq BID  Magnesium: Mg > 2: No additional supplementation needed    Thank you for allowing pharmacy to participate in this patient's care   Benetta Spar, PharmD, BCPS, [place] Pharmacist  Please check AMION for all [place] phone numbers After 10:00 PM, call [place] [phone]

## 2021-01-30 NOTE — Progress Notes (Addendum)
Electrophysiology Rounding Note  Patient Name: Hannah [name] Date of Encounter: 01/30/2021  Primary Cardiologist: Tatsuya Okray Meredith Leeds, MD  Electrophysiologist: Dr. Curt Bears   Subjective   Pt converted to sinus rhythm on Tikosyn 500 mcg BID   QTc from EKG last pm shows stable QTc at closer to 440-460 when measured manually.  The patient is doing well today.  At this time, the patient denies chest pain or shortness of breath  Chronic peripheral edema, worse somewhat since off metolazone. Left legs weeping today.   Inpatient Medications    Scheduled Meds:  apixaban  5 mg Oral BID   busPIRone  15 mg Oral TID   dofetilide  500 mcg Oral BID   DULoxetine  90 mg Oral Daily   furosemide  80 mg Intravenous Once   levothyroxine  100 mcg Oral Q0600   loratadine  10 mg Oral QHS   montelukast  10 mg Oral QHS   multivitamin  1 tablet Oral BID   pantoprazole  40 mg Oral Daily   potassium chloride  20 mEq Oral BID   rosuvastatin  5 mg Oral QHS   sodium chloride flush  3 mL Intravenous Q12H   spironolactone  12.5 mg Oral Daily   Continuous Infusions:  sodium chloride     PRN Meds: sodium chloride, sodium chloride flush, zolpidem   Vital Signs    Vitals:   01/29/21 0558 01/29/21 1337 01/29/21 2239 01/29/21 2306  BP: 117/79 125/78 124/61   Pulse: 94 69 74 85  Resp:  18  19  Temp:  98.2 F (36.8 C) 98.1 F (36.7 C)   TempSrc:  Oral Oral   SpO2: 97% 98% 100% 100%  Weight:      Height:       No intake or output data in the 24 hours ending 01/30/21 0833 Filed Weights   01/28/21 1611  Weight: (!) 138.3 kg    Physical Exam    GEN- The patient is well appearing, alert and oriented x 3 today.   Head- normocephalic, atraumatic Eyes-  Sclera clear, conjunctiva pink Ears- hearing intact Oropharynx- clear Neck- supple Lungs- Clear to ausculation bilaterally, normal work of breathing Heart- Regular rate and rhythm, no murmurs, rubs or gallops GI- soft, NT, ND, +  BS Extremities- no clubbing, cyanosis, or edema Skin- no rash or lesion Psych- euthymic mood, full affect Neuro- strength and sensation are intact  Labs    CBC No results for input(s): WBC, NEUTROABS, HGB, HCT, MCV, PLT in the last 72 hours. Basic Metabolic Panel Recent Labs    01/29/21 0143 01/30/21 0109  NA 139 138  K 3.7 4.3  CL 101 100  CO2 29 28  GLUCOSE 131* 141*  BUN 34* 33*  CREATININE 1.61* 1.71*  CALCIUM 9.1 9.0  MG 2.1 2.1    Potassium  Date/Time Value Ref Range Status  01/30/2021 01:09 AM 4.3 3.5 - 5.1 mmol/L Final   Magnesium  Date/Time Value Ref Range Status  01/30/2021 01:09 AM 2.1 1.7 - 2.4 mg/dL Final    Comment:    Performed at [facility], [place] [address] [place], [place] [address]    Telemetry    NSR 70-90s (personally reviewed)  Radiology    No results found.   Patient Profile     Hannah [name] is a 75 y.o. female with a past medical history significant for persistent atrial fibrillation.  They were admitted for tikosyn load.   Assessment & Plan  Persistent atrial fibrillation Pt converted to sinus rhythm on Tikosyn 500 mcg BID  Continue Eliquis Electrolytes stable.  CHA2DS2VASC is at least 4.   2. Acute on chronic diastolic CHF Central volume status OK but chronic peripheral edema worse today with leg weeping.  Hannah [name] ask WOC to assess left leg to see if UNNA boots recommended. Hannah [name] give dose of IV lasix this afternoon. Follow electrolytes closely.  Hannah [name] need close HF clinic follow up on d/c. Of note she has cancelled numerous appointments to follow up with them.   3. OSA on CPAP In room.    For questions or updates, please contact [place] Please consult [email or web address] for contact info under Cardiology/STEMI.  Signed, Hannah Friar, PA-C  01/30/2021, 8:33 AM   I have seen and examined this patient with Hannah [name].  Agree with above, note added to reflect my findings.  On exam, RRR, no  murmurs, lungs clear, 2+ edema. Converted to sinus rhythm. Now th 2-3+ edema and weeping. Have called wound care. Hannah [name] also give extra dose of lasix.    Hannah [name] M. Roselind Klus MD 01/30/2021 11:03 AM

## 2021-01-30 NOTE — Care Management (Signed)
[id number] 01-30-21 Case Manager spoke with the patient regarding Tikosyn cost. Patients cost is $1.25 and she wants to get the first fill here via [facility] and refills for 90 day supply sent to [facility]. Case Manager called the Pharmacy and the medication is available in stock. Case Manager will continue to follow for additional needs.

## 2021-01-30 NOTE — Consult Note (Signed)
WOC Nurse Consult Note: Reason for Consult: Chronic Lower extremity edema in the setting of CHF, atrial fibrillation Wound type: inflammatory Pressure Injury POA: NA Measurement: 4 ruptured serum filled blisters to left lower leg.  Chronic skin changes to bilateral lower legs.  States she has worn compression in the hospital before but has not been prescribed home compression.  We discuss her shortness of breath and that wraps will be removed if she becomes increasingly short of breath.  She understands that staff will remove compression prior to discharge and she can follow up with cardiologist regarding ongoing compression.  Wound [id number] and moist Drainage (amount, consistency, odor) moderate weeping to left lower leg.  Right leg is edematous and intact at this time. Skin is dry.  Periwound: chronic skin changes Dressing procedure/placement/frequency:  Will implement Unna boots to manage edema.  Unna boots should be removed prior to discharge.  Will not follow at this time.  Please re-consult if needed.  Domenic Moras MSN, RN, FNP-BC CWON Wound, Ostomy, Continence Nurse Pager [phone]

## 2021-01-30 NOTE — Progress Notes (Signed)
Morning EKG reviewed  Shows remains in NSR at 62 bpm with stable QTc close to 440-460 ms range when measured manually  Continue Tikosyn 500 mcg BID.   Plan for home tomorrow if QTc remains stable.     Shirley Friar, PA-C  Pager: [phone]  01/30/2021 11:51 AM

## 2021-01-31 ENCOUNTER — Other Ambulatory Visit (HOSPITAL_COMMUNITY): Payer: Self-pay

## 2021-01-31 LAB — BASIC METABOLIC PANEL
Anion gap: 11 (ref 5–15)
BUN: 39 mg/dL — ABNORMAL HIGH (ref 8–23)
CO2: 26 mmol/L (ref 22–32)
Calcium: 8.9 mg/dL (ref 8.9–10.3)
Chloride: 102 mmol/L (ref 98–111)
Creatinine, Ser: 1.82 mg/dL — ABNORMAL HIGH (ref 0.44–1.00)
GFR, Estimated: 29 mL/min — ABNORMAL LOW (ref >60)
Glucose, Bld: 135 mg/dL — ABNORMAL HIGH (ref 70–99)
Potassium: 4.2 mmol/L (ref 3.5–5.1)
Sodium: 139 mmol/L (ref 135–145)

## 2021-01-31 LAB — MAGNESIUM: Magnesium: 2.3 mg/dL (ref 1.7–2.4)

## 2021-01-31 MED ORDER — SPIRONOLACTONE 25 MG PO TABS: 12.5000 mg | ORAL_TABLET | Freq: Every day | ORAL | Status: DC

## 2021-01-31 MED ORDER — DOFETILIDE 500 MCG PO CAPS
500.0000 ug | ORAL_CAPSULE | Freq: Two times a day (BID) | ORAL | 6 refills | Status: DC
  Filled 2021-01-31: qty 60, 30d supply, fill #0

## 2021-01-31 MED ORDER — TORSEMIDE 20 MG PO TABS
60.0000 mg | ORAL_TABLET | Freq: Two times a day (BID) | ORAL | Status: DC
  Administered 2021-01-31: 60 mg via ORAL
  Filled 2021-01-31: qty 3

## 2021-01-31 NOTE — Progress Notes (Signed)
Orthopedic Tech Progress Note Patient Details:  Hannah [name] 1946/03/01 [id number]  Ortho Devices Type of Ortho Device: Haematologist Ortho Device/Splint Location: BLE Ortho Device/Splint Interventions: Ordered, Application, Adjustment   Post Interventions Patient Tolerated: Well Instructions Provided: Care of device  Janit Pagan 01/31/2021, 2:17 PM

## 2021-01-31 NOTE — Progress Notes (Signed)
Pharmacy: Dofetilide (Tikosyn) - Follow Up Assessment and Electrolyte Replacement  Pharmacy consulted to assist in monitoring and replacing electrolytes in this 75 y.o. female admitted on 01/28/2021 undergoing dofetilide initiation.   Labs:    Component Value Date/Time   K 4.2 01/31/2021 0250   MG 2.3 01/31/2021 0250     Plan: Potassium: K >/= 4: No additional supplementation needed  Magnesium: Mg > 2: No additional supplementation needed   As patient has required on average 40 mEq of potassium replacement every day, recommend discharging patient with prescription for:  Potassium chloride 20 mEq  twice daily  Thank you for allowing pharmacy to participate in this patient's care   Hildred Laser, PharmD Clinical Pharmacist **Pharmacist phone directory can now be found on [email or web address] ([email or web address]).  Listed under [place].

## 2021-01-31 NOTE — Care Management (Signed)
01-31-21 1604 Patient in need of unna boots for home. Case Manager discussed home care with patient. Several agencies called and they were unable to assist with [facility], [facility], [facility], [facility], and [facility]. [facility] was able to accept the patient for services. Start of care to begin within 24-48 hours post transition home. Patient will use [facility] for home and the unit secretary will arrange for the services.

## 2021-01-31 NOTE — Progress Notes (Addendum)
01/30/2021 evening EKG reviewed  Shows remains in NSR at 68 bpm with stable QTc close to 440-460 ms when measured manually  Continue Tikosyn 500 mcg BID for now.   Her Cr is borderline for dose adjustment. Reviewed with Dr. Curt Bears and will continue current dose for now.   Remove UNNA boots and re-assess legs. Torsemide this am. Close HF clinic follow up with cessation of metolazone.   Home if QTc remains stable. Full note pending disposition.   Shirley Friar, PA-C  Pager: [phone]  01/31/2021 7:09 AM

## 2021-01-31 NOTE — Plan of Care (Signed)
  Problem: Education: Goal: Knowledge of General Education information will improve Description Including pain rating scale, medication(s)/side effects and non-pharmacologic comfort measures Outcome: Progressing   

## 2021-01-31 NOTE — Discharge Summary (Addendum)
ELECTROPHYSIOLOGY PROCEDURE DISCHARGE SUMMARY    Patient ID: Hannah [name],  MRN: [id number], DOB/AGE: 1946-03-14 75 y.o.  Admit date: 01/28/2021 Discharge date: 01/31/2021  Primary Care Physician: Lujean Amel, MD  Primary Cardiologist: Jaron Czarnecki Meredith Leeds, MD  Electrophysiologist: Dr. Curt Bears Heart Failure: Dr. Aundra Dubin  Primary Discharge Diagnosis:  1.  Persistent atrial fibrillation status post Tikosyn loading this admission  Secondary Discharge Diagnosis:  2. Acute on chronic diastolic CHF 3. Chronic venous insufficiency 4. OSA on CPAP  Allergies  Allergen Reactions   Azithromycin Rash and Other (See Comments)    Severe rash     Procedures This Admission:  1.  Tikosyn loading  Brief HPI: Hannah [name] is a 75 y.o. female with a past medical history as noted above.  They were referred to EP in the outpatient setting for treatment options of atrial fibrillation.  Risks, benefits, and alternatives to Tikosyn were reviewed with the patient who wished to proceed.    Hospital Course:  The patient was admitted and Tikosyn was initiated.  Renal function and electrolytes were followed during the hospitalization.  Their QTc remained stable.  She converted to NSR on tikosyn and did not require direct current cardioversion. They were monitored until discharge on telemetry which demonstrated NSR.   She had chronic diastolic CHF with peripheral edema and chronic venous stasis changes to LEs. Left LE with several serous filled blisters that burst with weeping this admission. UNNA boots placed with improvement. UNNA boots Jarmal Lewelling be replaced and removed by [facility] as outpatient. Order placed.   On the day of discharge, they were examined by Dr. Curt Bears  who considered them stable for discharge to home.  Follow-up has been arranged with the Atrial Fibrillation clinic in approximately 1 week and with Dr. Curt Bears  in 4 weeks.   Close HF clinic follow up also arranged as below.    Physical Exam: Vitals:   01/30/21 2024 01/30/21 2253 01/31/21 0432 01/31/21 0815  BP: 137/71  (!) 139/54   Pulse: 66 65 70   Resp:  18 16   Temp: 98.5 F (36.9 C)  98.5 F (36.9 C)   TempSrc: Oral  Oral   SpO2: 100%  100%   Weight:    (!) 139 kg  Height:        GEN- The patient is well appearing, alert and oriented x 3 today.   HEENT: normocephalic, atraumatic; sclera clear, conjunctiva pink; hearing intact; oropharynx clear; neck supple, no JVP Lymph- no cervical lymphadenopathy Lungs- Clear to ausculation bilaterally, normal work of breathing.  No wheezes, rales, rhonchi Heart- Regular rate and rhythm, no murmurs, rubs or gallops, PMI not laterally displaced GI- soft, non-tender, non-distended, bowel sounds present, no hepatosplenomegaly Extremities- no clubbing, cyanosis, or edema; DP/PT/radial pulses 2+ bilaterally MS- no significant deformity or atrophy Skin- warm and dry, no rash or lesion Psych- euthymic mood, full affect Neuro- strength and sensation are intact   Labs:   Lab Results  Component Value Date   WBC 5.5 06/04/2020   HGB 9.9 (L) 06/04/2020   HCT 33.0 (L) 06/04/2020   MCV 91.9 06/04/2020   PLT 226 06/04/2020    Recent Labs  Lab 01/31/21 0250  NA 139  K 4.2  CL 102  CO2 26  BUN 39*  CREATININE 1.82*  CALCIUM 8.9  GLUCOSE 135*     Discharge Medications:  Allergies as of 01/31/2021       Reactions   Azithromycin Rash, Other (See Comments)  Severe rash        Medication List     TAKE these medications    Bariatric Rollator Misc For home use   dofetilide 500 MCG capsule Commonly known as: TIKOSYN Take 1 capsule (500 mcg total) by mouth 2 (two) times daily.   DULoxetine 30 MG capsule Commonly known as: CYMBALTA Take 3 capsules (90 mg total) by mouth daily.   Eliquis 5 MG Tabs tablet Generic drug: apixaban Take 1 tablet (5 mg total) by mouth 2 (two) times daily.   esomeprazole 40 MG capsule Commonly known as:  NEXIUM Take 40 mg by mouth as needed (indigestion/heartburn).   levocetirizine 5 MG tablet Commonly known as: XYZAL Take 5 mg by mouth at bedtime.   levothyroxine 100 MCG tablet Commonly known as: SYNTHROID Take 100 mcg by mouth daily.   montelukast 10 MG tablet Commonly known as: SINGULAIR Take 10 mg by mouth at bedtime.   Nyamyc powder Generic drug: nystatin Apply 1 application topically as needed (rash under breasts).   Potassium Chloride ER 20 MEQ Tbcr Take 20 mEq by mouth in the morning and at bedtime. May take an additional as needed with diuretic. Please call office for appointment [phone]   PRESERVISION AREDS 2 PO Take 1 tablet by mouth in the morning and at bedtime.   rosuvastatin 5 MG tablet Commonly known as: CRESTOR Take 5 mg by mouth daily.   spironolactone 25 MG tablet Commonly known as: ALDACTONE Take 0.5 tablets (12.5 mg total) by mouth daily.   torsemide 20 MG tablet Commonly known as: DEMADEX Take 3 tablets (60 mg total) by mouth 2 (two) times daily.   triamcinolone cream 0.1 % Commonly known as: KENALOG Apply 1 application topically daily. Applied to legs   zolpidem 10 MG tablet Commonly known as: AMBIEN Take 10 mg by mouth at bedtime as needed for sleep.       ASK your doctor about these medications    busPIRone 15 MG tablet Commonly known as: BUSPAR TAKE 1 TABLET THREE TIMES A DAY               Durable Medical Equipment  (From admission, onward)           Start     Ordered   01/31/21 1002  Heart failure home health orders  (Heart failure home health orders / Face to face)  Once       Comments: Heart Failure Follow-up Care:  Verify follow-up appointments per Patient Discharge Instructions. Confirm transportation arranged. Reconcile home medications with discharge medication list. Remove discontinued medications from use. Assist patient/caregiver to manage medications using pill box. Reinforce low sodium food  selection Assessments: Vital signs and oxygen saturation at each visit. Assess home environment for safety concerns, caregiver support and availability of low-sodium foods. Consult Education officer, museum, PT/OT, Dietitian, and CNA based on assessments. Perform comprehensive cardiopulmonary assessment. Notify MD for any change in condition or weight gain of 3 pounds in one day or 5 pounds in one week with symptoms. Daily Weights and Symptom Monitoring: Ensure patient has access to scales. Teach patient/caregiver to weigh daily before breakfast and after voiding using same scale and record.    Teach patient/caregiver to track weight and symptoms and when to notify Provider. Activity: Develop individualized activity plan with patient/caregiver.  Please change UNNA boots q 3-5 days until drainage has stopped.  Question Answer Comment  Heart Failure Follow-up Care [facility] ([facility]F) [facility] at [phone]   Obtain the following labs  Basic Metabolic Panel   Lab frequency Other see comments   Fax lab results to AHF Clinic at [phone]   Diet Low Sodium Heart Healthy   Fluid restrictions: 1500 mL Fluid      01/31/21 1003            Disposition:    Follow-up Information     [facility] Follow up.   Specialty: Cardiology Why: on 8/1 at 1200 noon for post hospital HF follow up. Contact information: [address] [id number] [place] [id number] [phone]        [facility], [place] Follow up.   Specialty: Cardiology Why: on 7/28 at 215 for post hospital tikosyn follow up Contact information: [place] [address] [phone]                 Duration of Discharge Encounter: Greater than 30 minutes including physician time.  Signed, Shirley Friar, PA-C  01/31/2021 12:15 PM    I have seen and examined this patient with Oda Kilts.  Agree with above, note added to  reflect my findings.  On exam, RRR, no murmurs.  Patient converted to sinus rhythm on Tikosyn.  She did not require cardioversion.  We Breniyah Romm plan for discharge today.  She Stefanos Haynesworth have home health follow-up for lower extremity blistering.  We Monty Spicher arrange for follow-up in heart failure clinic for fluid management as she was taken off of her metolazone due to dofetilide.  Wai Minotti M. Gilliam Hawkes MD 01/31/2021 12:39 PM

## 2021-02-01 DIAGNOSIS — I5032 Chronic diastolic (congestive) heart failure: Secondary | ICD-10-CM | POA: Diagnosis not present

## 2021-02-01 DIAGNOSIS — M1710 Unilateral primary osteoarthritis, unspecified knee: Secondary | ICD-10-CM | POA: Diagnosis not present

## 2021-02-01 DIAGNOSIS — E119 Type 2 diabetes mellitus without complications: Secondary | ICD-10-CM | POA: Diagnosis not present

## 2021-02-01 DIAGNOSIS — E7800 Pure hypercholesterolemia, unspecified: Secondary | ICD-10-CM | POA: Diagnosis not present

## 2021-02-01 DIAGNOSIS — N183 Chronic kidney disease, stage 3 unspecified: Secondary | ICD-10-CM | POA: Diagnosis not present

## 2021-02-01 DIAGNOSIS — F321 Major depressive disorder, single episode, moderate: Secondary | ICD-10-CM | POA: Diagnosis not present

## 2021-02-01 DIAGNOSIS — I482 Chronic atrial fibrillation, unspecified: Secondary | ICD-10-CM | POA: Diagnosis not present

## 2021-02-01 DIAGNOSIS — E039 Hypothyroidism, unspecified: Secondary | ICD-10-CM | POA: Diagnosis not present

## 2021-02-01 DIAGNOSIS — I4891 Unspecified atrial fibrillation: Secondary | ICD-10-CM | POA: Diagnosis not present

## 2021-02-01 DIAGNOSIS — N184 Chronic kidney disease, stage 4 (severe): Secondary | ICD-10-CM | POA: Diagnosis not present

## 2021-02-03 ENCOUNTER — Encounter (HOSPITAL_COMMUNITY): Payer: Self-pay

## 2021-02-03 ENCOUNTER — Telehealth: Payer: Self-pay | Admitting: Internal Medicine

## 2021-02-03 NOTE — Telephone Encounter (Signed)
Patient called on-call provider tonight to report that she has having worsening severe left lower extremity pain along with some swelling and drainage of fluid.  Notes that this was happening during her most recent hospitalization and her leg was wrapped and placed in an [employer].  Notes that when she took off the dressing because of pain, she noted that the leaking was worse.  She has been taking some pain medication over-the-counter which has subsided the pain some, however sounds like she is having some redness and swelling but pain that is significantly out of proportion to the appearance of her leg.  Her and her daughter called for recommendations and they were concerned that this could be infection.  They were requesting antibiotic therapy.  However given the above history, I am concerned that this could be a deeper tissue infection because the pain is so severe per report.  Given this, and her recent hospitalization I have recommended that she come to the emergency department immediately for further evaluation and imaging as needed per the discretion of the emergency department physicians.  The patient notes that she has appointments on Wednesday with her heart doctors, and asked if it was okay to wait until then.  I strongly recommended against waiting given reported symptoms and elevated concern, I recommended that the safest thing to do was come to the emergency department immediately for evaluation given my concern.  The patient and her daughter reported that the patient does not have reliable transportation to get to the emergency department, and therefore I recommended that she call EMS to come and evaluate and potentially transport her to the emergency department for further immediate evaluation.  At the end of the call, the patient and her daughter reported understanding of my concern and my recommendation to come to the emergency department for immediate evaluation.  I answered all of their  questions.  She did not have a primary cardiology complaint.  Billey Chang, MD Cardiology Fellow

## 2021-02-06 ENCOUNTER — Other Ambulatory Visit: Payer: Self-pay

## 2021-02-06 ENCOUNTER — Ambulatory Visit (HOSPITAL_COMMUNITY)
Admission: RE | Admit: 2021-02-06 | Discharge: 2021-02-06 | Disposition: A | Discharge status: Home / Self Care | Payer: PPO | Source: Ambulatory Visit | Attending: Physician Assistant | Admitting: Physician Assistant

## 2021-02-06 ENCOUNTER — Encounter (HOSPITAL_COMMUNITY): Payer: Self-pay | Admitting: Physician Assistant

## 2021-02-06 VITALS — BP 134/70 | HR 62 | Ht 66.0 in | Wt 307.6 lb

## 2021-02-06 DIAGNOSIS — Z7901 Long term (current) use of anticoagulants: Secondary | ICD-10-CM | POA: Insufficient documentation

## 2021-02-06 DIAGNOSIS — I10 Essential (primary) hypertension: Secondary | ICD-10-CM | POA: Insufficient documentation

## 2021-02-06 DIAGNOSIS — E119 Type 2 diabetes mellitus without complications: Secondary | ICD-10-CM | POA: Insufficient documentation

## 2021-02-06 DIAGNOSIS — Z79899 Other long term (current) drug therapy: Secondary | ICD-10-CM | POA: Diagnosis not present

## 2021-02-06 DIAGNOSIS — I4510 Unspecified right bundle-branch block: Secondary | ICD-10-CM | POA: Diagnosis not present

## 2021-02-06 DIAGNOSIS — I4819 Other persistent atrial fibrillation: Secondary | ICD-10-CM | POA: Diagnosis not present

## 2021-02-06 DIAGNOSIS — I5032 Chronic diastolic (congestive) heart failure: Secondary | ICD-10-CM | POA: Diagnosis not present

## 2021-02-06 DIAGNOSIS — I498 Other specified cardiac arrhythmias: Secondary | ICD-10-CM | POA: Insufficient documentation

## 2021-02-06 DIAGNOSIS — D6869 Other thrombophilia: Secondary | ICD-10-CM

## 2021-02-06 DIAGNOSIS — G4733 Obstructive sleep apnea (adult) (pediatric): Secondary | ICD-10-CM | POA: Diagnosis not present

## 2021-02-06 LAB — BASIC METABOLIC PANEL
Anion gap: 8 (ref 5–15)
BUN: 25 mg/dL — ABNORMAL HIGH (ref 8–23)
CO2: 28 mmol/L (ref 22–32)
Calcium: 9 mg/dL (ref 8.9–10.3)
Chloride: 103 mmol/L (ref 98–111)
Creatinine, Ser: 1.54 mg/dL — ABNORMAL HIGH (ref 0.44–1.00)
GFR, Estimated: 35 mL/min — ABNORMAL LOW (ref >60)
Glucose, Bld: 177 mg/dL — ABNORMAL HIGH (ref 70–99)
Potassium: 3.6 mmol/L (ref 3.5–5.1)
Sodium: 139 mmol/L (ref 135–145)

## 2021-02-06 LAB — MAGNESIUM: Magnesium: 2.5 mg/dL — ABNORMAL HIGH (ref 1.7–2.4)

## 2021-02-06 NOTE — Progress Notes (Signed)
Primary Care Physician: Lujean Amel, MD Referring Physician: Dr. Curt Bears  AHF- Dr. Yvonne Kendall Hannah [name] is a 75 y.o. female with a h/o persistent afib, chronic diastolic HF, DM, that is in the afib clinic for f/u ablation one month ago. Unfortunately, she is in afib,rate controlled. She was unaware.  Discussed with pt that she would require cardioversion. She was very upset and crying over the news. I will increase amiodarone to 200 mg bid until time of cardioversion. She states no missed eliquis 5 mg bid. She  has had covid shots.  Return to afib clinic, 06/26/20. She did have successful cardioversion but unfortunately, she is in afib today, rate controlled. She will lower Amiodarone back to 200 mg daily. I spoke to Dr. Shonna Chock and he suggested that I could offer her another cardioversion which the pt is not willingly to do at this point. He felt that her chances of returning to SR were lower than optimal going into procedure. She is also having some financial  concerns and I asked Kennyth Lose, SW with [facility] to speak to her and her daughter today.   Follow up in the AF clinic 01/28/21. Patient presents for dofetilide admission. She is in rate controlled afib with symptoms of SOB and fatigue. She denies any missed doses of anticoagulation in the last 3 weeks.   Follow up in the AF clinic 02/06/21. Patient is s/p dofetilide admission 7/18-7/21/22. She converted to SR on the medication and did not require DCCV. Patient felt she was back in afib with symptoms of fatigue and lower extremity edema. She is in SR today. Patient states "I don't believe I am in normal rhythm."  Today, she denies symptoms of palpitations, chest pain, orthopnea, PND, dizziness, presyncope, syncope, or neurologic sequela. The patient is tolerating medications without difficulties and is otherwise without complaint today.   Past Medical History:  Diagnosis Date   Atrial fibrillation ([place])    Diabetes mellitus without  complication ([place])    Glaucoma    BILATERAL   Hypertension    Thyroid disease    Volume overload 11/2019   Past Surgical History:  Procedure Laterality Date   APPENDECTOMY     ATRIAL FIBRILLATION ABLATION N/A 05/04/2020   Procedure: ATRIAL FIBRILLATION ABLATION;  Surgeon: Constance Haw, MD;  Location: [facility];  Service: Cardiovascular;  Laterality: N/A;   CARDIOVERSION  11/24/2019   CARDIOVERSION N/A 11/24/2019   Procedure: CARDIOVERSION;  Surgeon: Larey Dresser, MD;  Location: [facility] ENDOSCOPY;  Service: Cardiovascular;  Laterality: N/A;   CARDIOVERSION N/A 11/29/2019   Procedure: CARDIOVERSION;  Surgeon: Larey Dresser, MD;  Location: [facility] ENDOSCOPY;  Service: Cardiovascular;  Laterality: N/A;   CARDIOVERSION N/A 06/19/2020   Procedure: CARDIOVERSION;  Surgeon: Dorothy Spark, MD;  Location: [facility] ENDOSCOPY;  Service: Cardiovascular;  Laterality: N/A;   CHOLECYSTECTOMY     HERNIA REPAIR     UMBILICAL   LASIK     BILATERAL   TEE WITHOUT CARDIOVERSION N/A 11/24/2019   Procedure: TRANSESOPHAGEAL ECHOCARDIOGRAM (TEE);  Surgeon: Larey Dresser, MD;  Location: [facility] ENDOSCOPY;  Service: Cardiovascular;  Laterality: N/A;   TEE WITHOUT CARDIOVERSION N/A 05/03/2020   Procedure: TRANSESOPHAGEAL ECHOCARDIOGRAM (TEE);  Surgeon: Donato Heinz, MD;  Location: [facility] ENDOSCOPY;  Service: Cardiovascular;  Laterality: N/A;    Current Outpatient Medications  Medication Sig Dispense Refill   apixaban (ELIQUIS) 5 MG TABS tablet Take 1 tablet (5 mg total) by mouth 2 (two) times daily. 180 tablet  1   busPIRone (BUSPAR) 15 MG tablet TAKE 1 TABLET THREE TIMES A DAY 270 tablet 1   dofetilide (TIKOSYN) 500 MCG capsule Take 1 capsule (500 mcg total) by mouth 2 (two) times daily. 60 capsule 6   DULoxetine (CYMBALTA) 30 MG capsule Take 3 capsules (90 mg total) by mouth daily. 270 capsule 1   esomeprazole (NEXIUM) 40 MG capsule Take 40 mg by mouth as needed (indigestion/heartburn).       levocetirizine (XYZAL) 5 MG tablet Take 5 mg by mouth at bedtime.      levothyroxine (SYNTHROID) 100 MCG tablet Take 100 mcg by mouth daily.     Misc. Devices (BARIATRIC ROLLATOR) MISC For home use 1 each 0   montelukast (SINGULAIR) 10 MG tablet Take 10 mg by mouth at bedtime.      Multiple Vitamins-Minerals (PRESERVISION AREDS 2 PO) Take 1 tablet by mouth in the morning and at bedtime.     NYAMYC powder Apply 1 application topically as needed (rash under breasts).      Potassium Chloride ER 20 MEQ TBCR Take 20 mEq by mouth in the morning and at bedtime. May take an additional as needed with diuretic. Please call office for appointment [phone] 200 tablet 1   rosuvastatin (CRESTOR) 5 MG tablet Take 5 mg by mouth daily.     spironolactone (ALDACTONE) 25 MG tablet Take 0.5 tablets (12.5 mg total) by mouth daily.     torsemide (DEMADEX) 20 MG tablet Take 3 tablets (60 mg total) by mouth 2 (two) times daily. 180 tablet 3   triamcinolone cream (KENALOG) 0.1 % Apply 1 application topically daily. Applied to legs     zolpidem (AMBIEN) 10 MG tablet Take 10 mg by mouth at bedtime as needed for sleep.     No current facility-administered medications for this encounter.    Allergies  Allergen Reactions   Azithromycin Rash and Other (See Comments)    Severe rash    Social History   Socioeconomic History   Marital status: Legally Separated    Spouse name: Not on file   Number of children: Not on file   Years of education: Not on file   Highest education level: Not on file  Occupational History   Not on file  Tobacco Use   Smoking status: Never   Smokeless tobacco: Never  Vaping Use   Vaping Use: Never used  Substance and Sexual Activity   Alcohol use: Yes    Alcohol/week: 1.0 standard drink    Types: 1 Glasses of wine per week    Comment: occ   Drug use: No   Sexual activity: Not Currently  Other Topics Concern   Not on file  Social History Narrative   Not on file   Social  Determinants of Health   Financial Resource Strain: Not on file  Food Insecurity: Not on file  Transportation Needs: Not on file  Physical Activity: Not on file  Stress: Not on file  Social Connections: Not on file  Intimate Partner Violence: Not on file    Family History  Problem Relation Age of Onset   Lung cancer Mother    Colon cancer Father    Heart attack Brother    Heart disease Brother    Lung cancer Maternal Grandfather    Heart attack Paternal Grandfather     ROS- All systems are reviewed and negative except as per the HPI above  Physical Exam: Vitals:   02/06/21 1529  BP: 134/70  Pulse: 62  Weight: (!) 139.5 kg  Height: '5\' 6"'$  (1.676 m)    Wt Readings from Last 3 Encounters:  02/06/21 (!) 139.5 kg  01/31/21 (!) 139 kg  10/31/20 (!) 138.3 kg    Labs: Lab Results  Component Value Date   NA 139 01/31/2021   K 4.2 01/31/2021   CL 102 01/31/2021   CO2 26 01/31/2021   GLUCOSE 135 (H) 01/31/2021   BUN 39 (H) 01/31/2021   CREATININE 1.82 (H) 01/31/2021   CALCIUM 8.9 01/31/2021   MG 2.3 01/31/2021   Lab Results  Component Value Date   INR 2.9 03/20/2020   No results found for: CHOL, HDL, LDLCALC, TRIG   GEN- The patient is a well appearing obese elderly female, alert and oriented x 3 today.   HEENT-head normocephalic, atraumatic, sclera clear, conjunctiva pink, hearing intact, trachea midline. Lungs- Clear to ausculation bilaterally, normal work of breathing Heart- Regular rate and rhythm, no murmurs, rubs or gallops  GI- soft, NT, ND, + BS Extremities- no clubbing, cyanosis, 1-2+ bilateral edema, chronic stasis dermatitis  MS- no significant deformity or atrophy Skin- no rash or lesion Psych- euthymic mood, full affect Neuro- strength and sensation are intact   EKG- SR, 1st degree AV block, RBBB Vent. rate 62 BPM PR interval 252 ms QRS duration 124 ms QT/QTcB 534/542 ms   Assessment and Plan: 1. Persistent atrial fibrillation  S/p  ablation 05/04/20 S/p dofetilide admission 7/18-7/21/22 Patient remains in [place]. Patient denies being in SR despite ECG interpretation. We discussed that other medical conditions can cause fatigue, not only afib. Continue dofetilide 500 mcg BID. QT stable when corrected for RBBB Continue Eliquis 5 mg BID Check bmet/mag today  2. CHA2DS2VAScore at least 4 Continue  eliquis 5 mg bid   3. Chronic diastolic heart failure  Patient initially refused to weigh today but eventually agreed.  Weight up ~ 1 lbs. D/w [facility] team, will increase torsemide to 80 mg BID x 3 days. Take an extra K+ for 3 days. F/u with [facility] as scheduled.   4. OSA Encouraged compliance with CPAP therapy.   Follow up with Oda Kilts as scheduled.    [facility] [address] [place], [place] [address] [phone]

## 2021-02-06 NOTE — Patient Instructions (Signed)
Increase Torsemide '80mg'$  twice a day for the next 3 days then reduce to normal dosing.  Take an extra potassium tablet for the next 3 days then reduce to normal dosing  Call heart failure team on Friday at [phone] if swelling has not improved with extra dosing of toresmide

## 2021-02-07 ENCOUNTER — Other Ambulatory Visit (HOSPITAL_COMMUNITY): Payer: Self-pay | Admitting: *Deleted

## 2021-02-07 ENCOUNTER — Encounter (HOSPITAL_COMMUNITY): Payer: PPO | Admitting: Physician Assistant

## 2021-02-07 DIAGNOSIS — I5032 Chronic diastolic (congestive) heart failure: Secondary | ICD-10-CM

## 2021-02-07 MED ORDER — DOFETILIDE 500 MCG PO CAPS: 500.0000 ug | ORAL_CAPSULE | Freq: Two times a day (BID) | ORAL | 6 refills | Status: DC

## 2021-02-07 MED ORDER — POTASSIUM CHLORIDE ER 20 MEQ PO TBCR: 20.0000 meq | EXTENDED_RELEASE_TABLET | Freq: Three times a day (TID) | ORAL | 2 refills | Status: DC

## 2021-02-11 ENCOUNTER — Encounter (HOSPITAL_COMMUNITY): Payer: PPO

## 2021-02-14 ENCOUNTER — Other Ambulatory Visit (HOSPITAL_BASED_OUTPATIENT_CLINIC_OR_DEPARTMENT_OTHER): Payer: Self-pay | Admitting: Family Medicine

## 2021-02-14 ENCOUNTER — Ambulatory Visit (HOSPITAL_BASED_OUTPATIENT_CLINIC_OR_DEPARTMENT_OTHER): Payer: PPO | Admitting: Radiology

## 2021-02-14 DIAGNOSIS — Z1231 Encounter for screening mammogram for malignant neoplasm of breast: Secondary | ICD-10-CM

## 2021-02-14 NOTE — Progress Notes (Addendum)
Advanced Heart Failure Clinic Progress Note   PCP: Lujean Amel, MD  EP: Dr. Curt Bears HF: Cardiology: Dr. Aundra Dubin   Reason for Visit: Chronic Diastolic Heart Failure and Persistent Atrial Fibrillation   Hannah [name] is a 75 y.o. with history of atrial fibrillation and chronic diastolic CHF was initially referred to [facility] by Dr. Curt Bears for evaluation of CHF.  She moved to [facility] from [place] in 2018.  She was first diagnosed with atrial fibrillation in [place], had cardioversion.  She has been maintained on amiodarone for several years and has been on torsemide for several years for chronic diastolic HF.   Follow up visit 5/21 w/ Dr. Aundra Dubin, she was persistent atrial fibrillation despite being on amiodarone.  Also w/ volume overload. She had developed worsening exertional dyspnea and weeping LEE.  She has developed gradually worsening peripheral edema. Her home diuretics were adjusted. Torsemide was increased and metolazone added. She was set up for TEE guided DCCV on 5/13. TEE negative for cardiac thrombus. EF 60-65%. She converted to NSR w/ cardioversion but given continued marked edema, she was directly admitted for IV diuretics. She responded well to IV diuretics but reverted back to atrial fibrillation and required repeat DCCV. Was loaded on IV amio before being transitioned back to PO. D/c wt was 305 lb.    She had post hospital f/u w/ Dr. Aundra Dubin on 5/28 and was back in afib but rate controlled. She was referred back to Dr. Curt Bears for consideration for afib ablation. He saw her 7/21 and had ordered for her to get a sleep study to r/o OSA before pursuing ablation. She has return f/u w/ Dr. Curt Bears on 9/7.   Also of note, at her visit in 5/21, BMP showed a bump in SCr to 2.3. she was instructed to hold torsemide x 2 days then return to reduced dose 60 qam/40 qpm. She had f/u BMP 1 week later, 6/7, and SCr was still elevated but stable at 2.3  TEE 05/03/21 with no thrombus, underwent ablation 05/04/20  with Dr. Curt Bears. Follow up in afib clinic 11/21 she was back in afib, asymptomatic and ate controlled. Amio increased to 200 mg bid and referred for DCCV.  Underwent successful DCCV 12/21, unfortunately back in afib several days later. She did not want to undergo another DCCV. She underwent amio washout in anticipation of trial of Tikosyn. Metolazone was stopped for initiation of Tikosyn, qtc corrected for rBBB 450 ms, and torsemide increased to 60 mg bid.  Admitted 7/22 for initiation of Tikosyn. She converted to NSR without requiring DCCV. Had LLE edema with fluid filled blisters for which unna boots were placed. She was discharged with [facility].  Today she returns for post hospitalization HF follow up with her daughter. She was overloaded at her follow up with afib cllinic last week and we instructed to increase her torsemide to 80 mg bid x 3 days. Says she feels "lousy". Has swelling in her legs, SOB with increased physical activity. Denies CP, dizziness, or PND/Orthopnea. Appetite ok. No fever or chills. She does not weigh consistently at home. She does not wear her CPAP. Sometimes does not take her diuretics. Frustrated she is not being seen by Dr. Aundra Dubin.  Labs (4/21): TSH elevated 10.9, K 4.3, creatinine 1.17, LFTs normal.  Labs (5/21): K 3.6, creatinine 2.05  Labs (5/21): TSH elevated 6.9, K 4.1, creatinine 2.3, hgb 10.5  Labs (6/21): K 3.7, creatinine 2.39  Labs (7/22): K 3.6, creatinine 1.54  ECG (personally reviewed): SR  PMH: 1. Type 2 diabetes 2. HTN 3. Atrial fibrillation: Paroxysmal. 1st noted in 2010.  Had DCCV in the past.  4. Chronic diastolic CHF: Echo (A999333) with EF 60-65%, severe LAE.  5. Hypothyroidism.   Social History   Socioeconomic History   Marital status: Divorced    Spouse name: Not on file   Number of children: Not on file   Years of education: Not on file   Highest education level: Not on file  Occupational History   Not on file  Tobacco Use   Smoking  status: Never   Smokeless tobacco: Never  Vaping Use   Vaping Use: Never used  Substance and Sexual Activity   Alcohol use: Yes    Alcohol/week: 1.0 standard drink    Types: 1 Glasses of wine per week    Comment: occ   Drug use: No   Sexual activity: Not Currently  Other Topics Concern   Not on file  Social History Narrative   Not on file   Social Determinants of Health   Financial Resource Strain: Not on file  Food Insecurity: Not on file  Transportation Needs: Not on file  Physical Activity: Not on file  Stress: Not on file  Social Connections: Not on file  Intimate Partner Violence: Not on file   Family History  Problem Relation Age of Onset   Lung cancer Mother    Colon cancer Father    Heart attack Brother    Heart disease Brother    Lung cancer Maternal Grandfather    Heart attack Paternal Grandfather    ROS: All systems reviewed and negative except as per HPI.   Current Outpatient Medications  Medication Sig Dispense Refill   apixaban (ELIQUIS) 5 MG TABS tablet Take 1 tablet (5 mg total) by mouth 2 (two) times daily. 180 tablet 1   busPIRone (BUSPAR) 15 MG tablet TAKE 1 TABLET THREE TIMES A DAY 270 tablet 1   dofetilide (TIKOSYN) 500 MCG capsule Take 1 capsule (500 mcg total) by mouth 2 (two) times daily. 60 capsule 6   DULoxetine (CYMBALTA) 30 MG capsule Take 3 capsules (90 mg total) by mouth daily. 270 capsule 1   esomeprazole (NEXIUM) 40 MG capsule Take 40 mg by mouth as needed (indigestion/heartburn).      levocetirizine (XYZAL) 5 MG tablet Take 5 mg by mouth at bedtime.      levothyroxine (SYNTHROID) 100 MCG tablet Take 100 mcg by mouth daily.     Misc. Devices (BARIATRIC ROLLATOR) MISC For home use 1 each 0   montelukast (SINGULAIR) 10 MG tablet Take 10 mg by mouth at bedtime.      NYAMYC powder Apply 1 application topically as needed (rash under breasts).      Potassium Chloride ER 20 MEQ TBCR Take 20 mEq by mouth 3 (three) times daily with meals.  (Patient taking differently: Take 20 mEq by mouth. Patient is taking 40 mEq in the morning and 20 mEq in the evening.) 270 tablet 2   rosuvastatin (CRESTOR) 5 MG tablet Take 5 mg by mouth daily.     spironolactone (ALDACTONE) 25 MG tablet Take 0.5 tablets (12.5 mg total) by mouth daily.     torsemide (DEMADEX) 20 MG tablet Take 3 tablets (60 mg total) by mouth 2 (two) times daily. 180 tablet 3   triamcinolone cream (KENALOG) 0.1 % Apply 1 application topically daily. Applied to legs as needed     zolpidem (AMBIEN) 10 MG tablet Take 10 mg by  mouth at bedtime as needed for sleep.     No current facility-administered medications for this encounter.   BP 129/70   Pulse 69   SpO2 97%    PHYSICAL EXAM: ReDs Clip: 32% General:  NAD. No resp difficulty, arrived in [facility] HEENT: Normal Neck: Supple. Thick neck, JVP difficult. Carotids 2+ bilat; no bruits. No lymphadenopathy or thryomegaly appreciated. Cor: PMI nondisplaced. Regular rate & rhythm. No rubs, gallops or murmurs. Lungs: Clear, diminished in bases Abdomen: Soft, nontender, nondistended. No hepatosplenomegaly. No bruits or masses. Good bowel sounds. Extremities: No cyanosis, clubbing, rash, 2+ BLE edema; appears to have some degree of chronic venous insufficiency Neuro: Alert & oriented x 3, cranial nerves grossly intact. Moves all 4 extremities w/o difficulty. Irritable affect.  Assessment/plan: 1. Chronic diastolic CHF: Echo in A999333 with EF 60-65%, severe LAE. TEE at time of DCCV 5/21 showed EF 60-65%.  Chronically NYHA III, confounded by previously persistent afib and obesity/deconditoning. She is at least mildly volume overloaded today, she has refused to weigh, ReDs 32%, R>L sided. - Previously managed with once weekly metolazone but this was discontinued due to Tikosyn. - Start Jardiance 10 mg daily. We discussed potential GU side effects. Check A1C today. - Increase torsemide to 80 mg bid x 3 days, then back to 60 mg bid. - Will  have her increase her KCl to 40 mEq bid for now. - Continue spiro 12.5 mg daily. - BMET & BNP today, repeat BMET in 10 days. 2. Atrial fibrillation: Persistent. Failed DCCV x 2, failed Amiodarone. She is in SR today on ECG post Tikosyn. - QTc 504 ms today. - Continue Eliquis. 3. OSA: Strongly encouraged compliance with CPAP. 4. Hyperlipidemia: She is on Crestor.  5. Stage III CKD: BMET today. 6. Obesity: There is no height or weight on file to calculate BMI.  Follow up in 3 weeks with APP to reassess volume (may be able to increase spiro) and in 4-5 months with Dr. Aundra Dubin.  [place], FNP 02/15/2021

## 2021-02-15 ENCOUNTER — Other Ambulatory Visit (HOSPITAL_COMMUNITY): Payer: Self-pay

## 2021-02-15 ENCOUNTER — Other Ambulatory Visit: Payer: Self-pay

## 2021-02-15 ENCOUNTER — Telehealth (HOSPITAL_COMMUNITY): Payer: Self-pay | Admitting: Family Medicine

## 2021-02-15 ENCOUNTER — Ambulatory Visit (HOSPITAL_COMMUNITY)
Admission: RE | Admit: 2021-02-15 | Discharge: 2021-02-15 | Disposition: A | Discharge status: Home / Self Care | Payer: PPO | Source: Ambulatory Visit | Attending: Family Medicine | Admitting: Family Medicine

## 2021-02-15 ENCOUNTER — Encounter (HOSPITAL_COMMUNITY): Payer: Self-pay

## 2021-02-15 VITALS — BP 129/70 | HR 69

## 2021-02-15 DIAGNOSIS — I4819 Other persistent atrial fibrillation: Secondary | ICD-10-CM | POA: Insufficient documentation

## 2021-02-15 DIAGNOSIS — I5032 Chronic diastolic (congestive) heart failure: Secondary | ICD-10-CM | POA: Insufficient documentation

## 2021-02-15 DIAGNOSIS — Z7901 Long term (current) use of anticoagulants: Secondary | ICD-10-CM | POA: Diagnosis not present

## 2021-02-15 DIAGNOSIS — N183 Chronic kidney disease, stage 3 unspecified: Secondary | ICD-10-CM | POA: Diagnosis not present

## 2021-02-15 DIAGNOSIS — I130 Hypertensive heart and chronic kidney disease with heart failure and stage 1 through stage 4 chronic kidney disease, or unspecified chronic kidney disease: Secondary | ICD-10-CM | POA: Insufficient documentation

## 2021-02-15 DIAGNOSIS — Z79890 Hormone replacement therapy (postmenopausal): Secondary | ICD-10-CM | POA: Diagnosis not present

## 2021-02-15 DIAGNOSIS — E1122 Type 2 diabetes mellitus with diabetic chronic kidney disease: Secondary | ICD-10-CM | POA: Diagnosis not present

## 2021-02-15 DIAGNOSIS — G4733 Obstructive sleep apnea (adult) (pediatric): Secondary | ICD-10-CM | POA: Diagnosis not present

## 2021-02-15 DIAGNOSIS — I4891 Unspecified atrial fibrillation: Secondary | ICD-10-CM | POA: Diagnosis not present

## 2021-02-15 DIAGNOSIS — Z79899 Other long term (current) drug therapy: Secondary | ICD-10-CM | POA: Insufficient documentation

## 2021-02-15 DIAGNOSIS — E1100 Type 2 diabetes mellitus with hyperosmolarity without nonketotic hyperglycemic-hyperosmolar coma (NKHHC): Secondary | ICD-10-CM | POA: Insufficient documentation

## 2021-02-15 DIAGNOSIS — E669 Obesity, unspecified: Secondary | ICD-10-CM | POA: Insufficient documentation

## 2021-02-15 DIAGNOSIS — E785 Hyperlipidemia, unspecified: Secondary | ICD-10-CM | POA: Diagnosis not present

## 2021-02-15 LAB — BRAIN NATRIURETIC PEPTIDE: B Natriuretic Peptide: 142 pg/mL — ABNORMAL HIGH (ref 0.0–100.0)

## 2021-02-15 LAB — BASIC METABOLIC PANEL
Anion gap: 10 (ref 5–15)
BUN: 25 mg/dL — ABNORMAL HIGH (ref 8–23)
CO2: 27 mmol/L (ref 22–32)
Calcium: 9 mg/dL (ref 8.9–10.3)
Chloride: 103 mmol/L (ref 98–111)
Creatinine, Ser: 1.41 mg/dL — ABNORMAL HIGH (ref 0.44–1.00)
GFR, Estimated: 39 mL/min — ABNORMAL LOW (ref >60)
Glucose, Bld: 148 mg/dL — ABNORMAL HIGH (ref 70–99)
Potassium: 3.7 mmol/L (ref 3.5–5.1)
Sodium: 140 mmol/L (ref 135–145)

## 2021-02-15 LAB — HEMOGLOBIN A1C
Hgb A1c MFr Bld: 6.8 % — ABNORMAL HIGH (ref 4.8–5.6)
Mean Plasma Glucose: 148.46 mg/dL

## 2021-02-15 MED ORDER — EMPAGLIFLOZIN 10 MG PO TABS: 10.0000 mg | ORAL_TABLET | Freq: Every day | ORAL | 3 refills | Status: AC

## 2021-02-15 NOTE — Addendum Note (Signed)
Encounter addended by: Rafael Bihari, FNP on: 02/15/2021 5:38 PM  Actions taken: Clinical Note Signed

## 2021-02-15 NOTE — Progress Notes (Signed)
ReDS Vest / Clip - 02/15/21 1500       ReDS Vest / Clip   Station Marker D    Ruler Value 43    ReDS Value Range Low volume    ReDS Actual Value 32

## 2021-02-15 NOTE — Addendum Note (Signed)
Encounter addended by: Rafael Bihari, FNP on: 02/15/2021 5:43 PM  Actions taken: Order Reconciliation Section accessed, Order list changed, Home Medications modified

## 2021-02-15 NOTE — Patient Instructions (Signed)
Increase Torsemide to 80 mg (4 tabs) Twice daily FOR 3 DAYS ONLY then back to 60 mg (3 tabs) Twice daily   Start Jardiance 10 mg Daily  Your provider has prescribed Jardiance for you. Please be aware the most common side effect of this medication is urinary tract infections and yeast infections. Please practice good hygiene and keep this area clean and dry to help prevent this. If you do begin to have symptoms of these infections, such as difficulty urinating or painful urination,  please let us know.  Your physician recommends that you schedule a follow-up appointment in:  3-4 weeks with a Nurse Practitioner  4 months with Dr Liam Graham done today, your results will be available in MyChart, we will contact you for abnormal readings.  Your physician recommends that you return for lab work in: 1-2 weeks  Do the following things EVERYDAY: Weigh yourself in the morning before breakfast. Write it down and keep it in a log. Take your medicines as prescribed Eat low salt foods--Limit salt (sodium) to 2000 mg per day.  Stay as active as you can everyday Limit all fluids for the day to less than 2 liters  If you have any questions or concerns before your next appointment please send [place] a message through [place] or call our office at [phone].    TO LEAVE A MESSAGE FOR THE NURSE SELECT OPTION 2, PLEASE LEAVE A MESSAGE INCLUDING: YOUR NAME DATE OF BIRTH CALL BACK NUMBER REASON FOR CALL**this is important as we prioritize the call backs  YOU WILL RECEIVE A CALL BACK THE SAME DAY AS LONG AS YOU CALL BEFORE 4:00 PM  milAt the [facility], you and your health needs are our priority. As part of our continuing mission to provide you with exceptional heart care, we have created designated Provider Care Teams. These Care Teams include your primary Cardiologist (physician) and Advanced Practice Providers (APPs- Physician Assistants and Nurse Practitioners) who all work together to  provide you with the care you need, when you need it.   You may see any of the following providers on your designated Care Team at your next follow up: Dr Glori Bickers Dr Loralie Champagne Dr Patrice Paradise, NP Lyda Jester, [place] Ginnie Smart Audry Riles, PharmD   Please be sure to bring in all your medications bottles to every appointment.

## 2021-02-15 NOTE — Telephone Encounter (Signed)
Spoke to patient's daughter, Tilda Burrow, concerning her lab results today. Advised to increase her daily KCl to 40 mEq bid. Also notified her of new diabetes diagnosis. Advised to follow up with PCP. Daughter verbalized understanding and was in agreement with plan. Repeat labs scheduled.  Allena Katz, FNP-BC

## 2021-02-20 ENCOUNTER — Ambulatory Visit (HOSPITAL_BASED_OUTPATIENT_CLINIC_OR_DEPARTMENT_OTHER): Payer: PPO | Admitting: Radiology

## 2021-02-26 ENCOUNTER — Other Ambulatory Visit (HOSPITAL_COMMUNITY): Payer: PPO

## 2021-02-27 ENCOUNTER — Other Ambulatory Visit (HOSPITAL_COMMUNITY): Payer: PPO

## 2021-03-04 ENCOUNTER — Other Ambulatory Visit: Payer: Self-pay

## 2021-03-04 ENCOUNTER — Ambulatory Visit (HOSPITAL_COMMUNITY): Admission: RE | Admit: 2021-03-04 | Payer: PPO | Source: Ambulatory Visit

## 2021-03-05 ENCOUNTER — Other Ambulatory Visit (HOSPITAL_COMMUNITY): Payer: PPO

## 2021-03-06 ENCOUNTER — Other Ambulatory Visit (HOSPITAL_COMMUNITY): Payer: PPO

## 2021-03-07 ENCOUNTER — Other Ambulatory Visit (HOSPITAL_COMMUNITY): Payer: Self-pay | Admitting: Cardiology

## 2021-03-07 ENCOUNTER — Encounter (HOSPITAL_COMMUNITY): Payer: PPO

## 2021-03-08 ENCOUNTER — Encounter (HOSPITAL_COMMUNITY): Payer: PPO

## 2021-03-12 ENCOUNTER — Ambulatory Visit: Payer: PPO | Admitting: Student

## 2021-03-13 ENCOUNTER — Other Ambulatory Visit: Payer: Self-pay

## 2021-03-13 ENCOUNTER — Emergency Department (HOSPITAL_COMMUNITY): Payer: PPO

## 2021-03-13 ENCOUNTER — Encounter (HOSPITAL_COMMUNITY): Payer: Self-pay | Admitting: Emergency Medicine

## 2021-03-13 ENCOUNTER — Emergency Department (HOSPITAL_COMMUNITY)
Admission: EM | Admit: 2021-03-13 | Discharge: 2021-03-14 | Disposition: A | Discharge status: Home / Self Care | Payer: PPO | Attending: Emergency Medicine | Admitting: Emergency Medicine

## 2021-03-13 DIAGNOSIS — W19XXXA Unspecified fall, initial encounter: Secondary | ICD-10-CM | POA: Diagnosis not present

## 2021-03-13 DIAGNOSIS — M25561 Pain in right knee: Secondary | ICD-10-CM | POA: Diagnosis not present

## 2021-03-13 DIAGNOSIS — Y92481 Parking lot as the place of occurrence of the external cause: Secondary | ICD-10-CM | POA: Insufficient documentation

## 2021-03-13 DIAGNOSIS — W010XXA Fall on same level from slipping, tripping and stumbling without subsequent striking against object, initial encounter: Secondary | ICD-10-CM | POA: Diagnosis not present

## 2021-03-13 DIAGNOSIS — I5033 Acute on chronic diastolic (congestive) heart failure: Secondary | ICD-10-CM | POA: Insufficient documentation

## 2021-03-13 DIAGNOSIS — E119 Type 2 diabetes mellitus without complications: Secondary | ICD-10-CM | POA: Diagnosis not present

## 2021-03-13 DIAGNOSIS — S8991XA Unspecified injury of right lower leg, initial encounter: Secondary | ICD-10-CM | POA: Diagnosis not present

## 2021-03-13 DIAGNOSIS — Z7901 Long term (current) use of anticoagulants: Secondary | ICD-10-CM | POA: Diagnosis not present

## 2021-03-13 DIAGNOSIS — I110 Hypertensive heart disease with heart failure: Secondary | ICD-10-CM | POA: Insufficient documentation

## 2021-03-13 DIAGNOSIS — S80919A Unspecified superficial injury of unspecified knee, initial encounter: Secondary | ICD-10-CM | POA: Diagnosis not present

## 2021-03-13 DIAGNOSIS — E039 Hypothyroidism, unspecified: Secondary | ICD-10-CM | POA: Insufficient documentation

## 2021-03-13 DIAGNOSIS — I4819 Other persistent atrial fibrillation: Secondary | ICD-10-CM | POA: Diagnosis not present

## 2021-03-13 DIAGNOSIS — M79604 Pain in right leg: Secondary | ICD-10-CM | POA: Diagnosis not present

## 2021-03-13 DIAGNOSIS — M7989 Other specified soft tissue disorders: Secondary | ICD-10-CM | POA: Diagnosis not present

## 2021-03-13 MED ORDER — LORAZEPAM 1 MG PO TABS
1.0000 mg | ORAL_TABLET | Freq: Once | ORAL | Status: AC
  Administered 2021-03-13: 1 mg via ORAL
  Filled 2021-03-13: qty 1

## 2021-03-13 MED ORDER — KETOROLAC TROMETHAMINE 15 MG/ML IJ SOLN
15.0000 mg | Freq: Once | INTRAMUSCULAR | Status: AC
  Administered 2021-03-13: 15 mg via INTRAMUSCULAR
  Filled 2021-03-13: qty 1

## 2021-03-13 MED ORDER — LIDOCAINE 5 % EX PTCH
3.0000 | MEDICATED_PATCH | CUTANEOUS | Status: DC
  Administered 2021-03-13: 3 via TRANSDERMAL
  Filled 2021-03-13: qty 3

## 2021-03-13 NOTE — ED Provider Notes (Signed)
[facility] EMERGENCY DEPARTMENT Provider Note   CSN: [id number] Arrival date & time: 03/13/21  1846     History Chief Complaint  Patient presents with   Hannah [name] is a 75 y.o. female.  Medical history of type 2 diabetes, paroxysmal atrial fibrillation on Eliquis who presents emergency department today after fall.  States that she was attempting [place] get on the [employer] bus this afternoon.  States that she was walking up a ramp to the bus when she tripped and fell.  Was able to return to standing position with assistance and then was walking toward a chair when she fell again.  States that on the second fall she landed on her knees and felt immediate pain in her right knee.  She denies hitting her head, loss of consciousness, chest pain or dizziness prior to falling.  Denies head or neck pain. Denies previous trauma/falls/surgeries on the knee.  She has not ambulated on the leg since the fall.  Denies pain to the right hip or ankle.  Fall      Past Medical History:  Diagnosis Date   Atrial fibrillation ([place])    Diabetes mellitus without complication ([place])    Glaucoma    BILATERAL   Hypertension    Thyroid disease    Volume overload 11/2019    Patient Active Problem List   Diagnosis Date Noted   Secondary hypercoagulable state ([place]) 01/28/2021   Persistent atrial fibrillation ([place]) 01/28/2021   Acute on chronic diastolic heart failure ([place]) 11/24/2019   PAF (paroxysmal atrial fibrillation) ([place]) 11/24/2019   Hypothyroidism 11/24/2019   DMII (diabetes mellitus, type 2) ([place]) 11/24/2019   Acute respiratory failure ([place]) 12/07/2018   PTSD (post-traumatic stress disorder) 08/29/2017   Atrial fibrillation ([place]) [I48.91] 06/16/2017   Long term (current) use of anticoagulants [Z79.01] 06/16/2017   Chronic venous insufficiency 09/18/2016   CHF (congestive heart failure) ([place]) 09/13/2016   Community acquired pneumonia 09/13/2016   Cellulitis of  right lower extremity 09/13/2016    Past Surgical History:  Procedure Laterality Date   APPENDECTOMY     ATRIAL FIBRILLATION ABLATION N/A 05/04/2020   Procedure: ATRIAL FIBRILLATION ABLATION;  Surgeon: Constance Haw, MD;  Location: [facility];  Service: Cardiovascular;  Laterality: N/A;   CARDIOVERSION  11/24/2019   CARDIOVERSION N/A 11/24/2019   Procedure: CARDIOVERSION;  Surgeon: Larey Dresser, MD;  Location: [facility] ENDOSCOPY;  Service: Cardiovascular;  Laterality: N/A;   CARDIOVERSION N/A 11/29/2019   Procedure: CARDIOVERSION;  Surgeon: Larey Dresser, MD;  Location: [facility] ENDOSCOPY;  Service: Cardiovascular;  Laterality: N/A;   CARDIOVERSION N/A 06/19/2020   Procedure: CARDIOVERSION;  Surgeon: Dorothy Spark, MD;  Location: [facility] ENDOSCOPY;  Service: Cardiovascular;  Laterality: N/A;   CHOLECYSTECTOMY     HERNIA REPAIR     UMBILICAL   LASIK     BILATERAL   TEE WITHOUT CARDIOVERSION N/A 11/24/2019   Procedure: TRANSESOPHAGEAL ECHOCARDIOGRAM (TEE);  Surgeon: Larey Dresser, MD;  Location: [facility] ENDOSCOPY;  Service: Cardiovascular;  Laterality: N/A;   TEE WITHOUT CARDIOVERSION N/A 05/03/2020   Procedure: TRANSESOPHAGEAL ECHOCARDIOGRAM (TEE);  Surgeon: Donato Heinz, MD;  Location: [facility] ENDOSCOPY;  Service: Cardiovascular;  Laterality: N/A;     OB History   No obstetric history on file.     Family History  Problem Relation Age of Onset   Lung cancer Mother    Colon cancer Father    Heart attack Brother    Heart disease  Brother    Lung cancer Maternal Grandfather    Heart attack Paternal Grandfather     Social History   Tobacco Use   Smoking status: Never   Smokeless tobacco: Never  Vaping Use   Vaping Use: Never used  Substance Use Topics   Alcohol use: Yes    Alcohol/week: 1.0 standard drink    Types: 1 Glasses of wine per week    Comment: occ   Drug use: No    Home Medications Prior to Admission medications   Medication Sig Start Date End  Date Taking? Authorizing Provider  apixaban (ELIQUIS) 5 MG TABS tablet Take 1 tablet (5 mg total) by mouth 2 (two) times daily. 11/05/20   Camnitz, Will Hassell Done, MD  busPIRone (BUSPAR) 15 MG tablet TAKE 1 TABLET THREE TIMES A DAY 12/24/17   Eksir, Richard Miu, MD  dofetilide (TIKOSYN) 500 MCG capsule Take 1 capsule (500 mcg total) by mouth 2 (two) times daily. 02/07/21   Fenton, Clint R, PA  DULoxetine (CYMBALTA) 30 MG capsule Take 3 capsules (90 mg total) by mouth daily. 11/05/17   Eksir, Richard Miu, MD  empagliflozin (JARDIANCE) 10 MG TABS tablet Take 1 tablet (10 mg total) by mouth daily before breakfast. 02/15/21   Milford, Maricela Bo, FNP  esomeprazole (NEXIUM) 40 MG capsule Take 40 mg by mouth as needed (indigestion/heartburn).  11/14/19   [provider]  levocetirizine (XYZAL) 5 MG tablet Take 5 mg by mouth at bedtime.     [provider]  levothyroxine (SYNTHROID) 100 MCG tablet Take 100 mcg by mouth daily. 01/11/21   [provider]  [employer] (BARIATRIC ROLLATOR) MISC For home use 11/30/19   Lyda Jester M, PA-C  montelukast (SINGULAIR) 10 MG tablet Take 10 mg by mouth at bedtime.     [provider]  [facility] powder Apply 1 application topically as needed (rash under breasts).  03/19/18   [provider]  potassium chloride SA (KLOR-CON) 20 MEQ tablet Take 40 mEq by mouth 2 (two) times daily. She is taking 40 mEq in AM/20 mEq in PM    [provider]  rosuvastatin (CRESTOR) 5 MG tablet Take 5 mg by mouth daily. 12/23/17   [provider]  spironolactone (ALDACTONE) 25 MG tablet Take 0.5 tablets (12.5 mg total) by mouth daily. 01/31/21   Shirley Friar, PA-C  torsemide (DEMADEX) 20 MG tablet TAKE THREE TABLETS BY MOUTH TWICE A DAY 03/11/21   Larey Dresser, MD  triamcinolone cream (KENALOG) 0.1 % Apply 1 application topically daily. Applied to legs as needed 11/14/19   [provider]  zolpidem (AMBIEN) 10 MG  tablet Take 10 mg by mouth at bedtime as needed for sleep. 12/28/20   [provider]    Allergies    Azithromycin  Review of Systems   Review of Systems  Musculoskeletal:  Positive for arthralgias. Negative for neck pain and neck stiffness.  Skin:  Negative for wound.  Neurological:  Negative for dizziness, syncope and light-headedness.  All other systems reviewed and are negative.  Physical Exam Updated Vital Signs BP 132/60 (BP Location: Right Arm)   Pulse 65   Temp 98.3 F (36.8 C) (Oral)   Resp 18   Ht '5\' 6"'$  (1.676 m)   Wt (!) 140 kg   SpO2 100%   BMI 49.82 kg/m   Physical Exam Constitutional:      General: She is not in acute distress.    Appearance: Normal appearance. She  is obese.  HENT:     Head: Normocephalic and atraumatic.  Eyes:     Extraocular Movements: Extraocular movements intact.     Pupils: Pupils are equal, round, and reactive to light.  Pulmonary:     Effort: Pulmonary effort is normal.  Musculoskeletal:        General: Tenderness present. No deformity.     Cervical back: Normal range of motion and neck supple. No tenderness.     Right knee: Ecchymosis and bony tenderness present. Decreased range of motion. Tenderness present.     Left knee: Normal.     Right lower leg: Normal. No swelling.     Left lower leg: Normal. No swelling.     Right ankle: Normal.     Left ankle: Normal.     Comments: 5/5 strength in left lower extremity. 5/5 strength of right ankle to dorsi/plantar flexion. 5/5 strength on right leg extension. 3/5 and exam not completed due to pain to right knee flexion.   Skin:    General: Skin is warm and dry.     Capillary Refill: Capillary refill takes less than 2 seconds.     Findings: Bruising present.  Neurological:     General: No focal deficit present.     Mental Status: She is alert and oriented to person, place, and time. Mental status is at baseline.     Sensory: No sensory deficit.    ED Results / Procedures /  Treatments   Labs (all labs ordered are listed, but only abnormal results are displayed) Labs Reviewed - No data to display  EKG None  Radiology DG Knee Complete 4 Views Right  Result Date: 03/13/2021 CLINICAL DATA:  Fall on knee. Right knee pain and decreased range of motion. Initial encounter. EXAM: RIGHT KNEE - COMPLETE 4+ VIEW COMPARISON:  None. FINDINGS: Anterior soft tissue swelling is noted. No evidence of fracture, dislocation, or joint effusion. Severe osteoarthritis is seen involving the medial compartment. Enthesopathic changes are seen involving the patella. Generalized osteopenia also noted. IMPRESSION: Anterior soft tissue swelling. No evidence of fracture or dislocation. Severe medial compartment osteoarthritis. Electronically Signed   By: Marlaine Hind M.D.   On: 03/13/2021 20:16    Procedures Procedures   Medications Ordered in ED Medications  ketorolac (TORADOL) 15 MG/ML injection 15 mg (15 mg Intramuscular Given 03/13/21 1903)  LORazepam (ATIVAN) tablet 1 mg (1 mg Oral Given 03/13/21 1902)   ED Course  I have reviewed the triage vital signs and the nursing notes.  Pertinent labs & imaging results that were available during my care of the patient were reviewed by me and considered in my medical decision making (see chart for details).  0022: Patient refused CT head scan.  I went and discussed the risk and benefits of not scanning her head given that she is on blood thinners.  She continues to refuse CT scan.  States that she did not hit her head and just wants to go home.  MDM Rules/Calculators/A&P 75 year old female presents emergency department after fall on Eliquis. Only complaint is right knee pain.  Right knee plain films with soft tissue swelling with no evidence of fracture or dislocation.  Patient is otherwise well-appearing, hemodynamically stable, shows no evidence of neurovascular injury or compartment syndrome.  I have low suspicion for other ligamentous  injury, septic arthritis, gout flare.  Placed Ace wrap with Lidoderm cream on right knee, with ice.  Will prescribe some out patient topical pain medication.  Not a  candidate for NSAIDs given that she is on Eliquis.  Hesitant for prescribing narcotics given recent falls.  Safe for discharge home.  Patient is agreeable to the plan. Final Clinical Impression(s) / ED Diagnoses Final diagnoses:  Fall, initial encounter  Acute pain of right knee    Rx / DC Orders ED Discharge Orders          Ordered    diclofenac Sodium (VOLTAREN) 1 % GEL  4 times daily        03/14/21 0026    lidocaine (LIDODERM) 5 %  Every 24 hours        03/14/21 0026             Mickie Hillier, PA-C 03/14/21 0029    Palumbo, April, MD 03/14/21 0157

## 2021-03-13 NOTE — ED Provider Notes (Signed)
Emergency Medicine Provider Triage Evaluation Note  Hannah [name] , a 75 y.o. female  was evaluated in triage.  Pt complains of right knee pain after a fall. She is on eliquis for afib. She received 186mg of fentanyl in route. Patient denies head pain, LOC, bleeding.  Review of Systems  Positive: Right Knee pain Negative: LOC, head pain  Physical Exam  BP (!) 147/39 (BP Location: Right Arm)   Pulse 76   Temp 98.3 F (36.8 C) (Oral)   Resp 20   Ht '5\' 6"'$  (1.676 m)   Wt (!) 140 kg   SpO2 97%   BMI 49.82 kg/m  Gen:   Awake, significant distress, screaming in pain intermittently Resp:  Normal effort MSK:   Does not move right leg 2/2 pain, no obvious deformity. TTP of right patella, bruising of knee, no obvious hemarthrosis Other:  Pulses intact in BIL LE  Medical Decision Making  Medically screening exam initiated at 6:59 PM.  Appropriate orders placed.  Hannah [name] was informed that the remainder of the evaluation will be completed by another provider, this initial triage assessment does not replace that evaluation, and the importance of remaining in the ED until their evaluation is complete.  fall   PDorien Chihuahua08/31/22 1902    STruddie Hidden MD 03/14/21 [phone]

## 2021-03-13 NOTE — ED Notes (Signed)
Patient transported to CT 

## 2021-03-13 NOTE — ED Triage Notes (Signed)
Pt here via [facility] after she tripped and fell in the [facility] parking lot. Landed on knees on pavement, did not hit head, no LOC, is on eliquis. EMS gave 173mg fentanyl, pt in triage c/o 10/10 R knee pain. 116/62, hr 62, 98% RA, CBG 247, 20g LFA

## 2021-03-14 MED ORDER — LIDOCAINE 5 % EX PTCH: 1.0000 | MEDICATED_PATCH | CUTANEOUS | 0 refills | Status: DC

## 2021-03-14 MED ORDER — DICLOFENAC SODIUM 1 % EX GEL: 2.0000 g | Freq: Four times a day (QID) | CUTANEOUS | 0 refills | Status: AC

## 2021-03-14 NOTE — Discharge Instructions (Addendum)
You are seen in the emergency department today after falling and hitting your right knee.  Took x-rays of the knee which just showed some soft tissue swelling.  There is no fracture or dislocation of your knee.  Additionally we wanted to take a scan of your head to make sure that there was no bleeding given that you are on blood thinners.  However this is not something that you wanted to do.  We discussed the risk and benefits of not scanning your head, and you are okay with not having the images done.  At this time is safe for you to be discharged home.  We gave you some IV pain medication while you were here.  Additionally we Ace wrap your knee and placed some ice on it.  I have prescribed you lidocaine patches and a medication called Voltaren gel.  Both of these are topical pain medications for your knee, please use them as prescribed.  Additionally you want to elevate your leg, use ice intermittently and continue to move the knee as tolerated to promote healing.  Please return to the emergency department for any reason.  I have also put the name and address of [facility] which is a local orthopedic for follow-up as needed.

## 2021-03-14 NOTE — ED Notes (Signed)
Pt refused CT scan. PA notified.

## 2021-03-15 ENCOUNTER — Encounter (HOSPITAL_COMMUNITY): Payer: PPO

## 2021-03-26 ENCOUNTER — Ambulatory Visit: Payer: PPO | Admitting: Cardiology

## 2021-04-02 ENCOUNTER — Inpatient Hospital Stay (HOSPITAL_COMMUNITY)
Admission: RE | Admit: 2021-04-02 | Discharge: 2021-04-02 | Disposition: A | Discharge status: Home / Self Care | Payer: PPO | Source: Ambulatory Visit

## 2021-04-20 ENCOUNTER — Encounter (HOSPITAL_COMMUNITY): Payer: Self-pay | Admitting: Emergency Medicine

## 2021-04-20 ENCOUNTER — Emergency Department (HOSPITAL_COMMUNITY): Payer: PPO

## 2021-04-20 ENCOUNTER — Other Ambulatory Visit: Payer: Self-pay

## 2021-04-20 ENCOUNTER — Inpatient Hospital Stay (HOSPITAL_COMMUNITY)
Admission: EM | Admit: 2021-04-20 | Discharge: 2021-04-26 | DRG: 603 | Disposition: A | Discharge status: Home Health | Payer: PPO | Attending: Internal Medicine | Admitting: Internal Medicine

## 2021-04-20 DIAGNOSIS — M545 Low back pain, unspecified: Secondary | ICD-10-CM | POA: Diagnosis not present

## 2021-04-20 DIAGNOSIS — I5032 Chronic diastolic (congestive) heart failure: Secondary | ICD-10-CM | POA: Diagnosis present

## 2021-04-20 DIAGNOSIS — N1832 Chronic kidney disease, stage 3b: Secondary | ICD-10-CM | POA: Diagnosis present

## 2021-04-20 DIAGNOSIS — Z881 Allergy status to other antibiotic agents status: Secondary | ICD-10-CM

## 2021-04-20 DIAGNOSIS — R7989 Other specified abnormal findings of blood chemistry: Secondary | ICD-10-CM | POA: Diagnosis present

## 2021-04-20 DIAGNOSIS — L02415 Cutaneous abscess of right lower limb: Secondary | ICD-10-CM | POA: Diagnosis present

## 2021-04-20 DIAGNOSIS — M79672 Pain in left foot: Secondary | ICD-10-CM | POA: Diagnosis not present

## 2021-04-20 DIAGNOSIS — F4310 Post-traumatic stress disorder, unspecified: Secondary | ICD-10-CM | POA: Diagnosis present

## 2021-04-20 DIAGNOSIS — K219 Gastro-esophageal reflux disease without esophagitis: Secondary | ICD-10-CM | POA: Diagnosis present

## 2021-04-20 DIAGNOSIS — H409 Unspecified glaucoma: Secondary | ICD-10-CM | POA: Diagnosis present

## 2021-04-20 DIAGNOSIS — R0602 Shortness of breath: Secondary | ICD-10-CM | POA: Diagnosis not present

## 2021-04-20 DIAGNOSIS — D649 Anemia, unspecified: Secondary | ICD-10-CM | POA: Diagnosis not present

## 2021-04-20 DIAGNOSIS — M25561 Pain in right knee: Secondary | ICD-10-CM | POA: Diagnosis not present

## 2021-04-20 DIAGNOSIS — B9562 Methicillin resistant Staphylococcus aureus infection as the cause of diseases classified elsewhere: Secondary | ICD-10-CM | POA: Diagnosis present

## 2021-04-20 DIAGNOSIS — I130 Hypertensive heart and chronic kidney disease with heart failure and stage 1 through stage 4 chronic kidney disease, or unspecified chronic kidney disease: Secondary | ICD-10-CM | POA: Diagnosis present

## 2021-04-20 DIAGNOSIS — Z79890 Hormone replacement therapy (postmenopausal): Secondary | ICD-10-CM | POA: Diagnosis not present

## 2021-04-20 DIAGNOSIS — I509 Heart failure, unspecified: Secondary | ICD-10-CM

## 2021-04-20 DIAGNOSIS — L03115 Cellulitis of right lower limb: Secondary | ICD-10-CM | POA: Diagnosis present

## 2021-04-20 DIAGNOSIS — L0390 Cellulitis, unspecified: Secondary | ICD-10-CM | POA: Diagnosis not present

## 2021-04-20 DIAGNOSIS — E119 Type 2 diabetes mellitus without complications: Secondary | ICD-10-CM | POA: Diagnosis not present

## 2021-04-20 DIAGNOSIS — M1711 Unilateral primary osteoarthritis, right knee: Secondary | ICD-10-CM | POA: Diagnosis not present

## 2021-04-20 DIAGNOSIS — E669 Obesity, unspecified: Secondary | ICD-10-CM | POA: Diagnosis present

## 2021-04-20 DIAGNOSIS — I480 Paroxysmal atrial fibrillation: Secondary | ICD-10-CM

## 2021-04-20 DIAGNOSIS — I482 Chronic atrial fibrillation, unspecified: Secondary | ICD-10-CM | POA: Diagnosis present

## 2021-04-20 DIAGNOSIS — M25461 Effusion, right knee: Secondary | ICD-10-CM | POA: Diagnosis not present

## 2021-04-20 DIAGNOSIS — M71061 Abscess of bursa, right knee: Secondary | ICD-10-CM | POA: Diagnosis not present

## 2021-04-20 DIAGNOSIS — D631 Anemia in chronic kidney disease: Secondary | ICD-10-CM | POA: Diagnosis present

## 2021-04-20 DIAGNOSIS — S8001XA Contusion of right knee, initial encounter: Secondary | ICD-10-CM | POA: Diagnosis present

## 2021-04-20 DIAGNOSIS — Z7901 Long term (current) use of anticoagulants: Secondary | ICD-10-CM | POA: Diagnosis not present

## 2021-04-20 DIAGNOSIS — Z79899 Other long term (current) drug therapy: Secondary | ICD-10-CM

## 2021-04-20 DIAGNOSIS — I517 Cardiomegaly: Secondary | ICD-10-CM | POA: Diagnosis not present

## 2021-04-20 DIAGNOSIS — E039 Hypothyroidism, unspecified: Secondary | ICD-10-CM | POA: Diagnosis present

## 2021-04-20 DIAGNOSIS — Z6841 Body Mass Index (BMI) 40.0 and over, adult: Secondary | ICD-10-CM | POA: Diagnosis not present

## 2021-04-20 DIAGNOSIS — L8990 Pressure ulcer of unspecified site, unspecified stage: Secondary | ICD-10-CM | POA: Insufficient documentation

## 2021-04-20 DIAGNOSIS — Z20822 Contact with and (suspected) exposure to covid-19: Secondary | ICD-10-CM | POA: Diagnosis present

## 2021-04-20 DIAGNOSIS — Z8249 Family history of ischemic heart disease and other diseases of the circulatory system: Secondary | ICD-10-CM | POA: Diagnosis not present

## 2021-04-20 DIAGNOSIS — Z7984 Long term (current) use of oral hypoglycemic drugs: Secondary | ICD-10-CM | POA: Diagnosis not present

## 2021-04-20 DIAGNOSIS — E1151 Type 2 diabetes mellitus with diabetic peripheral angiopathy without gangrene: Secondary | ICD-10-CM | POA: Diagnosis present

## 2021-04-20 DIAGNOSIS — Z872 Personal history of diseases of the skin and subcutaneous tissue: Secondary | ICD-10-CM | POA: Diagnosis not present

## 2021-04-20 DIAGNOSIS — W1830XA Fall on same level, unspecified, initial encounter: Secondary | ICD-10-CM | POA: Diagnosis present

## 2021-04-20 DIAGNOSIS — E1122 Type 2 diabetes mellitus with diabetic chronic kidney disease: Secondary | ICD-10-CM | POA: Diagnosis present

## 2021-04-20 DIAGNOSIS — M009 Pyogenic arthritis, unspecified: Secondary | ICD-10-CM | POA: Diagnosis present

## 2021-04-20 DIAGNOSIS — M23221 Derangement of posterior horn of medial meniscus due to old tear or injury, right knee: Secondary | ICD-10-CM | POA: Diagnosis not present

## 2021-04-20 DIAGNOSIS — I890 Lymphedema, not elsewhere classified: Secondary | ICD-10-CM | POA: Diagnosis present

## 2021-04-20 DIAGNOSIS — J811 Chronic pulmonary edema: Secondary | ICD-10-CM | POA: Diagnosis not present

## 2021-04-20 DIAGNOSIS — E785 Hyperlipidemia, unspecified: Secondary | ICD-10-CM | POA: Diagnosis present

## 2021-04-20 DIAGNOSIS — R52 Pain, unspecified: Secondary | ICD-10-CM

## 2021-04-20 LAB — CBC WITH DIFFERENTIAL/PLATELET
Abs Immature Granulocytes: 0.04 10*3/uL (ref 0.00–0.07)
Basophils Absolute: 0 10*3/uL (ref 0.0–0.1)
Basophils Relative: 1 %
Eosinophils Absolute: 0.1 10*3/uL (ref 0.0–0.5)
Eosinophils Relative: 2 %
HCT: 22.5 % — ABNORMAL LOW (ref 36.0–46.0)
Hemoglobin: 5.7 g/dL — CL (ref 12.0–15.0)
Immature Granulocytes: 1 %
Lymphocytes Relative: 11 %
Lymphs Abs: 0.9 10*3/uL (ref 0.7–4.0)
MCH: 20.9 pg — ABNORMAL LOW (ref 26.0–34.0)
MCHC: 25.3 g/dL — ABNORMAL LOW (ref 30.0–36.0)
MCV: 82.4 fL (ref 80.0–100.0)
Monocytes Absolute: 0.4 10*3/uL (ref 0.1–1.0)
Monocytes Relative: 5 %
Neutro Abs: 7 10*3/uL (ref 1.7–7.7)
Neutrophils Relative %: 80 %
Platelets: 357 10*3/uL (ref 150–400)
RBC: 2.73 MIL/uL — ABNORMAL LOW (ref 3.87–5.11)
RDW: 19.8 % — ABNORMAL HIGH (ref 11.5–15.5)
WBC: 8.5 10*3/uL (ref 4.0–10.5)
nRBC: 0.2 % (ref 0.0–0.2)

## 2021-04-20 LAB — BASIC METABOLIC PANEL
Anion gap: 9 (ref 5–15)
BUN: 22 mg/dL (ref 8–23)
CO2: 23 mmol/L (ref 22–32)
Calcium: 9.1 mg/dL (ref 8.9–10.3)
Chloride: 107 mmol/L (ref 98–111)
Creatinine, Ser: 1.26 mg/dL — ABNORMAL HIGH (ref 0.44–1.00)
GFR, Estimated: 45 mL/min — ABNORMAL LOW (ref >60)
Glucose, Bld: 113 mg/dL — ABNORMAL HIGH (ref 70–99)
Potassium: 4.6 mmol/L (ref 3.5–5.1)
Sodium: 139 mmol/L (ref 135–145)

## 2021-04-20 LAB — ABO/RH: ABO/RH(D): AB POS

## 2021-04-20 LAB — PROTIME-INR
INR: 1.3 — ABNORMAL HIGH (ref 0.8–1.2)
Prothrombin Time: 16.5 seconds — ABNORMAL HIGH (ref 11.4–15.2)

## 2021-04-20 LAB — RESP PANEL BY RT-PCR (FLU A&B, COVID) ARPGX2
Influenza A by PCR: NEGATIVE
Influenza B by PCR: NEGATIVE
SARS Coronavirus 2 by RT PCR: NEGATIVE

## 2021-04-20 LAB — TROPONIN I (HIGH SENSITIVITY)
Troponin I (High Sensitivity): 17 ng/L (ref <18)
Troponin I (High Sensitivity): 16 ng/L (ref <18)

## 2021-04-20 LAB — BRAIN NATRIURETIC PEPTIDE: B Natriuretic Peptide: 214.3 pg/mL — ABNORMAL HIGH (ref 0.0–100.0)

## 2021-04-20 LAB — LACTIC ACID, PLASMA
Lactic Acid, Venous: 1.9 mmol/L (ref 0.5–1.9)
Lactic Acid, Venous: 1.4 mmol/L (ref 0.5–1.9)

## 2021-04-20 LAB — PREPARE RBC (CROSSMATCH)

## 2021-04-20 MED ORDER — LEVOTHYROXINE SODIUM 100 MCG PO TABS
100.0000 ug | ORAL_TABLET | Freq: Every day | ORAL | Status: DC
  Administered 2021-04-21 – 2021-04-23 (×3): 100 ug via ORAL
  Filled 2021-04-20 (×3): qty 1

## 2021-04-20 MED ORDER — MORPHINE SULFATE (PF) 2 MG/ML IV SOLN
2.0000 mg | Freq: Once | INTRAVENOUS | Status: AC
  Administered 2021-04-20: 2 mg via INTRAVENOUS
  Filled 2021-04-20: qty 1

## 2021-04-20 MED ORDER — TORSEMIDE 20 MG PO TABS
60.0000 mg | ORAL_TABLET | Freq: Two times a day (BID) | ORAL | Status: DC
  Administered 2021-04-21 – 2021-04-26 (×11): 60 mg via ORAL
  Filled 2021-04-20 (×11): qty 3

## 2021-04-20 MED ORDER — PANTOPRAZOLE SODIUM 40 MG PO TBEC
40.0000 mg | DELAYED_RELEASE_TABLET | Freq: Every day | ORAL | Status: DC
  Administered 2021-04-21 – 2021-04-26 (×6): 40 mg via ORAL
  Filled 2021-04-20 (×6): qty 1

## 2021-04-20 MED ORDER — SODIUM CHLORIDE 0.9% FLUSH
3.0000 mL | Freq: Two times a day (BID) | INTRAVENOUS | Status: DC
  Administered 2021-04-20 – 2021-04-25 (×11): 3 mL via INTRAVENOUS

## 2021-04-20 MED ORDER — MONTELUKAST SODIUM 10 MG PO TABS
10.0000 mg | ORAL_TABLET | Freq: Every day | ORAL | Status: DC
  Administered 2021-04-20 – 2021-04-25 (×6): 10 mg via ORAL
  Filled 2021-04-20 (×7): qty 1

## 2021-04-20 MED ORDER — VANCOMYCIN HCL 10 G IV SOLR
2500.0000 mg | Freq: Once | INTRAVENOUS | Status: AC
  Administered 2021-04-20: 2500 mg via INTRAVENOUS
  Filled 2021-04-20: qty 2500

## 2021-04-20 MED ORDER — FUROSEMIDE 10 MG/ML IJ SOLN
60.0000 mg | Freq: Once | INTRAMUSCULAR | Status: AC
  Administered 2021-04-20: 60 mg via INTRAVENOUS
  Filled 2021-04-20: qty 6

## 2021-04-20 MED ORDER — ACETAMINOPHEN 325 MG PO TABS
650.0000 mg | ORAL_TABLET | Freq: Four times a day (QID) | ORAL | Status: DC | PRN
  Administered 2021-04-21 – 2021-04-25 (×4): 650 mg via ORAL
  Filled 2021-04-20 (×4): qty 2

## 2021-04-20 MED ORDER — DOFETILIDE 250 MCG PO CAPS
500.0000 ug | ORAL_CAPSULE | Freq: Two times a day (BID) | ORAL | Status: DC
  Administered 2021-04-20 – 2021-04-26 (×12): 500 ug via ORAL
  Filled 2021-04-20: qty 2
  Filled 2021-04-20: qty 1
  Filled 2021-04-20 (×4): qty 2
  Filled 2021-04-20: qty 1
  Filled 2021-04-20 (×2): qty 2
  Filled 2021-04-20: qty 1
  Filled 2021-04-20 (×3): qty 2

## 2021-04-20 MED ORDER — DULOXETINE HCL 30 MG PO CPEP
90.0000 mg | ORAL_CAPSULE | Freq: Every day | ORAL | Status: DC
  Administered 2021-04-21 – 2021-04-26 (×6): 90 mg via ORAL
  Filled 2021-04-20 (×6): qty 3

## 2021-04-20 MED ORDER — ROSUVASTATIN CALCIUM 5 MG PO TABS
5.0000 mg | ORAL_TABLET | Freq: Every day | ORAL | Status: DC
  Administered 2021-04-21 – 2021-04-26 (×6): 5 mg via ORAL
  Filled 2021-04-20 (×6): qty 1

## 2021-04-20 MED ORDER — BUSPIRONE HCL 5 MG PO TABS
15.0000 mg | ORAL_TABLET | Freq: Three times a day (TID) | ORAL | Status: DC
  Administered 2021-04-20 – 2021-04-26 (×17): 15 mg via ORAL
  Filled 2021-04-20 (×3): qty 1
  Filled 2021-04-20: qty 2
  Filled 2021-04-20 (×11): qty 1
  Filled 2021-04-20: qty 2
  Filled 2021-04-20: qty 1

## 2021-04-20 MED ORDER — POLYETHYLENE GLYCOL 3350 17 G PO PACK: 17.0000 g | PACK | Freq: Every day | ORAL | Status: DC | PRN

## 2021-04-20 MED ORDER — DICLOFENAC SODIUM 1 % EX GEL
2.0000 g | Freq: Four times a day (QID) | CUTANEOUS | Status: DC | PRN
  Administered 2021-04-22 – 2021-04-23 (×2): 2 g via TOPICAL
  Filled 2021-04-20: qty 100

## 2021-04-20 MED ORDER — SPIRONOLACTONE 12.5 MG HALF TABLET
12.5000 mg | ORAL_TABLET | Freq: Every day | ORAL | Status: DC
  Administered 2021-04-21 – 2021-04-26 (×6): 12.5 mg via ORAL
  Filled 2021-04-20 (×6): qty 1

## 2021-04-20 MED ORDER — ACETAMINOPHEN 650 MG RE SUPP: 650.0000 mg | Freq: Four times a day (QID) | RECTAL | Status: DC | PRN

## 2021-04-20 MED ORDER — INSULIN ASPART 100 UNIT/ML IJ SOLN
0.0000 [IU] | Freq: Three times a day (TID) | INTRAMUSCULAR | Status: DC
  Administered 2021-04-21 – 2021-04-22 (×3): 2 [IU] via SUBCUTANEOUS
  Administered 2021-04-22 – 2021-04-23 (×2): 3 [IU] via SUBCUTANEOUS
  Administered 2021-04-23 – 2021-04-26 (×7): 2 [IU] via SUBCUTANEOUS

## 2021-04-20 MED ORDER — SODIUM CHLORIDE 0.9 % IV SOLN
2.0000 g | Freq: Two times a day (BID) | INTRAVENOUS | Status: DC
  Administered 2021-04-21 – 2021-04-25 (×10): 2 g via INTRAVENOUS
  Filled 2021-04-20 (×12): qty 2

## 2021-04-20 MED ORDER — VANCOMYCIN HCL 1250 MG/250ML IV SOLN
1250.0000 mg | INTRAVENOUS | Status: DC
  Administered 2021-04-21: 1250 mg via INTRAVENOUS
  Filled 2021-04-20 (×3): qty 250

## 2021-04-20 MED ORDER — SODIUM CHLORIDE 0.9 % IV SOLN
2.0000 g | INTRAVENOUS | Status: DC
  Administered 2021-04-20: 2 g via INTRAVENOUS
  Filled 2021-04-20: qty 2

## 2021-04-20 MED ORDER — SODIUM CHLORIDE 0.9 % IV SOLN
10.0000 mL/h | Freq: Once | INTRAVENOUS | Status: AC
  Administered 2021-04-20: 10 mL/h via INTRAVENOUS

## 2021-04-20 NOTE — H&P (Signed)
History and Physical   Hannah [name] [id number] DOB: 1946/06/14 DOA: 04/20/2021  PCP: Hannah Amel, MD   Patient coming from: Home  Chief Complaint: Continued intermittent pain in the knee since fall as well as some shortness of breath and chest tightness.  HPI: Hannah [name] is a 75 y.o. female with medical history significant of CHF, atrial fibrillation, cellulitis, venous insufficiency, diabetes, hypothyroidism, PTSD who presents with drainage from her knee.  Patient states she had a fall on at the end of August and initially she was evaluated and was doing okay.  Since that fall she has had intermittent pain and then she started having drainage about a week ago initially it was a lot of blood per her report and she has since then had some pus draining with some redness around the knee.  She also reports some intermittent low back pain since the fall and some shortness of breath and chest tightness similar to her previous CHF symptoms.  She is currently on Eliquis for her A. fib.  She denies fevers, chills, abdominal pain, constipation, diarrhea, nausea, vomiting.  ED Course: Vital signs in the ED were stable.  Lab work-up showed BMP with creatinine stable at 1.2 and glucose 113.  CBC showed hemoglobin 5.7 down from baseline of 9.9.  PT 16.5 and INR 1.3 consistent with anticoagulation use.  BNP mildly elevated at 214, troponin negative x2, lactic acid negative x2, respiratory panel flu and COVID-negative.  Patient was typed and screened in the ED and blood cultures were obtained.  Chest x-ray showed cardiomegaly with pulmonary vascular congestion, lumbar x-ray showed no acute normality but did show chronic degenerative disc disease, right knee x-ray showed severe osteoarthritis but no acute abnormality.  Patient was ordered 2 units of blood in the ED and started on vancomycin and cefepime.  Review of Systems: As per HPI otherwise all other systems reviewed and are  negative.  Past Medical History:  Diagnosis Date   Atrial fibrillation ([place])    Diabetes mellitus without complication ([place])    Glaucoma    BILATERAL   Hypertension    Thyroid disease    Volume overload 11/2019    Past Surgical History:  Procedure Laterality Date   APPENDECTOMY     ATRIAL FIBRILLATION ABLATION N/A 05/04/2020   Procedure: ATRIAL FIBRILLATION ABLATION;  Surgeon: Hannah Haw, MD;  Location: [facility];  Service: Cardiovascular;  Laterality: N/A;   CARDIOVERSION  11/24/2019   CARDIOVERSION N/A 11/24/2019   Procedure: CARDIOVERSION;  Surgeon: Hannah Dresser, MD;  Location: [facility] ENDOSCOPY;  Service: Cardiovascular;  Laterality: N/A;   CARDIOVERSION N/A 11/29/2019   Procedure: CARDIOVERSION;  Surgeon: Hannah Dresser, MD;  Location: [facility] ENDOSCOPY;  Service: Cardiovascular;  Laterality: N/A;   CARDIOVERSION N/A 06/19/2020   Procedure: CARDIOVERSION;  Surgeon: Hannah Spark, MD;  Location: [facility] ENDOSCOPY;  Service: Cardiovascular;  Laterality: N/A;   CHOLECYSTECTOMY     HERNIA REPAIR     UMBILICAL   LASIK     BILATERAL   TEE WITHOUT CARDIOVERSION N/A 11/24/2019   Procedure: TRANSESOPHAGEAL ECHOCARDIOGRAM (TEE);  Surgeon: Hannah Dresser, MD;  Location: [facility] ENDOSCOPY;  Service: Cardiovascular;  Laterality: N/A;   TEE WITHOUT CARDIOVERSION N/A 05/03/2020   Procedure: TRANSESOPHAGEAL ECHOCARDIOGRAM (TEE);  Surgeon: Hannah Heinz, MD;  Location: [facility] ENDOSCOPY;  Service: Cardiovascular;  Laterality: N/A;    Social History  reports that she has never smoked. She has never used smokeless tobacco. She reports current alcohol use of about  1.0 standard drink per week. She reports that she does not use drugs.  Allergies  Allergen Reactions   Azithromycin Rash and Other (See Comments)    Severe rash    Family History  Problem Relation Age of Onset   Lung cancer Mother    Colon cancer Father    Heart attack Brother    Heart disease Brother     Lung cancer Maternal Grandfather    Heart attack Paternal Grandfather   Reviewed on admission  Prior to Admission medications   Medication Sig Start Date End Date Taking? Authorizing Provider  acetaminophen (TYLENOL) 500 MG tablet Take 500 mg by mouth every 6 (six) hours as needed for moderate pain.   Yes [provider]  apixaban (ELIQUIS) 5 MG TABS tablet Take 1 tablet (5 mg total) by mouth 2 (two) times daily. 11/05/20  Yes Hannah [name], Hannah Hassell Done, MD  busPIRone (BUSPAR) 15 MG tablet TAKE 1 TABLET THREE TIMES A DAY 12/24/17  Yes Hannah [name], Hannah Miu, MD  diclofenac Sodium (VOLTAREN) 1 % GEL Apply 2 g topically 4 (four) times daily. Patient taking differently: Apply 2 g topically 4 (four) times daily as needed (pain). 03/14/21  Yes Hannah Hillier, PA-C  dofetilide (TIKOSYN) 500 MCG capsule Take 1 capsule (500 mcg total) by mouth 2 (two) times daily. 02/07/21  Yes Hannah [name], Hannah R, PA  DULoxetine (CYMBALTA) 30 MG capsule Take 3 capsules (90 mg total) by mouth daily. 11/05/17  Yes Hannah [name], Hannah Miu, MD  empagliflozin (JARDIANCE) 10 MG TABS tablet Take 1 tablet (10 mg total) by mouth daily before breakfast. 02/15/21  Yes Hannah [name], Hannah Bo, FNP  esomeprazole (NEXIUM) 40 MG capsule Take 40 mg by mouth as needed (indigestion/heartburn).  11/14/19  Yes [provider]  levocetirizine (XYZAL) 5 MG tablet Take 5 mg by mouth at bedtime.    Yes [provider]  levothyroxine (SYNTHROID) 100 MCG tablet Take 100 mcg by mouth daily. 01/11/21  Yes [provider]  lidocaine (LIDODERM) 5 % Place 1 patch onto the skin daily. Remove & Discard patch within 12 hours or as directed by MD 03/14/21  Yes Hannah Hillier, PA-C  montelukast (SINGULAIR) 10 MG tablet Take 10 mg by mouth at bedtime.    Yes [provider]  Multiple Vitamins-Minerals (PRESERVISION AREDS) CAPS Take 1 capsule by mouth in the morning and at bedtime.   Yes [provider]  [facility] powder Apply 1  application topically as needed (rash under breasts).  03/19/18  Yes [provider]  potassium chloride SA (KLOR-CON) 20 MEQ tablet Take 20-40 mEq by mouth 2 (two) times daily. She is taking 40 mEq in AM/20 mEq in PM   Yes [provider]  rosuvastatin (CRESTOR) 5 MG tablet Take 5 mg by mouth daily. 12/23/17  Yes [provider]  spironolactone (ALDACTONE) 25 MG tablet Take 0.5 tablets (12.5 mg total) by mouth daily. 01/31/21  Yes Shirley Friar, PA-C  torsemide (DEMADEX) 20 MG tablet TAKE THREE TABLETS BY MOUTH TWICE A DAY 03/11/21  Yes Hannah Dresser, MD  triamcinolone cream (KENALOG) 0.1 % Apply 1 application topically daily as needed (rash). 11/14/19  Yes [provider]  zolpidem (AMBIEN) 10 MG tablet Take 10 mg by mouth at bedtime. 12/28/20  Yes [provider]  [employer] Devices (BARIATRIC ROLLATOR) MISC For home use 11/30/19   Consuelo Pandy, PA-C    Physical Exam: Vitals:   04/20/21 1757 04/20/21 1845 04/20/21 2122 04/20/21 2153  BP: (!) 142/59 (!) 135/50 127/68 (!) 149/69  Pulse: 66 (!) 52 71 73  Resp: 17  (!) 24 20  Temp: 98.2 F (36.8 C)   97.8 F (36.6 C)  TempSrc: Oral   Oral  SpO2: 100% 92% 98%    Physical Exam Constitutional:      General: She is not in acute distress.    Appearance: Normal appearance.  HENT:     Head: Normocephalic and atraumatic.     Mouth/Throat:     Mouth: Mucous membranes are moist.     Pharynx: Oropharynx is clear.  Eyes:     Extraocular Movements: Extraocular movements intact.     Pupils: Pupils are equal, round, and reactive to light.  Cardiovascular:     Rate and Rhythm: Normal rate and regular rhythm.     Pulses: Normal pulses.     Heart sounds: Normal heart sounds.  Pulmonary:     Effort: Pulmonary effort is normal. No respiratory distress.     Breath sounds: Normal breath sounds.  Abdominal:     General: Bowel sounds are normal. There is no distension.     Palpations: Abdomen is  soft.     Tenderness: There is no abdominal tenderness.  Musculoskeletal:        General: No swelling or deformity.  Skin:    General: Skin is warm and dry.     Comments: Venous stasis changes Red warm area at site of drainage from knee  Neurological:     General: No focal deficit present.     Mental Status: Mental status is at baseline.   Labs on Admission: I have personally reviewed following labs and imaging studies  CBC: Recent Labs  Lab 04/20/21 1631  WBC 8.5  NEUTROABS 7.0  HGB 5.7*  HCT 22.5*  MCV 82.4  PLT [id number]    Basic Metabolic Panel: Recent Labs  Lab 04/20/21 1631  NA 139  K 4.6  CL 107  CO2 23  GLUCOSE 113*  BUN 22  CREATININE 1.26*  CALCIUM 9.1    GFR: CrCl cannot be calculated (Unknown ideal weight.).  Liver Function Tests: No results for input(s): AST, ALT, ALKPHOS, BILITOT, PROT, ALBUMIN in the last 168 hours.  Urine analysis: No results found for: COLORURINE, APPEARANCEUR, [place], [place], GLUCOSEU, [place], BILIRUBINUR, KETONESUR, PROTEINUR, [place], NITRITE, LEUKOCYTESUR  Radiological Exams on Admission: DG Chest 2 View  Result Date: 04/20/2021 CLINICAL DATA:  Shortness of breath EXAM: CHEST - 2 VIEW COMPARISON:  11/26/2019 FINDINGS: Bilateral mild interstitial thickening. No focal consolidation. No pleural effusion or pneumothorax. Stable cardiomegaly. No acute osseous abnormality. IMPRESSION: Cardiomegaly with pulmonary vascular congestion. Electronically Signed   By: Kathreen Devoid M.D.   On: 04/20/2021 17:53   DG Lumbar Spine Complete  Result Date: 04/20/2021 CLINICAL DATA:  Pain post fall. EXAM: LUMBAR SPINE - COMPLETE 4+ VIEW COMPARISON:  None. FINDINGS: There is no evidence of lumbar spine fracture. Alignment is normal. Multilevel spondylosis and posterior facet arthropathy. IMPRESSION: 1. No acute fracture or dislocation identified about the lumbosacral spine. 2. Multilevel spondylosis and posterior facet arthropathy. Electronically  Signed   By: Fidela Salisbury M.D.   On: 04/20/2021 17:47   DG Knee Complete 4 Views Right  Result Date: 04/20/2021 CLINICAL DATA:  Pain post fall. EXAM: RIGHT KNEE - COMPLETE 4+ VIEW COMPARISON:  None. FINDINGS: No evidence of fracture, dislocation, or joint effusion. Moderate to severe osteoarthritic changes, worse in the medial compartment. Soft tissues are unremarkable. IMPRESSION: Moderate to severe  osteoarthritic changes, worse in the medial compartment. No evidence fracture or dislocation. Electronically Signed   By: Fidela Salisbury M.D.   On: 04/20/2021 17:49    EKG: Not yet performed  Assessment/Plan Principal Problem:   Symptomatic anemia Active Problems:   CHF (congestive heart failure) (HCC)   PTSD (post-traumatic stress disorder)   PAF (paroxysmal atrial fibrillation) (HCC)   Hypothyroidism   DMII (diabetes mellitus, type 2) (HCC)   Cellulitis  Symptomatic anemia > Patient noted to have hemoglobin of 4.7 down from baseline 9.9.  In the setting of large knee hematoma with significant blood drainage over the past week. - Monitor on telemetry - 2 units of pRBCs transfusion started in the ED - Hannah likely need a dose of Lasix given her CHF - We Hannah check FOBT to be safe - Trend hemoglobin - Goal hemoglobin of 8 given history of CHF - Hold home anticoagulation  Cellulitis > History of fall and knee with significant blood drainage as above followed by significant pus with erythema and warmth around the knee. - Started on Vanco and cefepime in the ED Hannah continue this for now - Monitor fever curve and white count currently no leukocytosis.  CHF > Mildly elevated BNP at 214, having some symptoms of shortness of breath and chest tightness however this is likely related to her symptomatic anemia. > Hannah likely need diuresis with her 2 units transfusion - We Hannah continue with home torsemide and give a one-time dose of IV Lasix with transfusion - Continue with home  spironolactone - Check magnesium  Atrial fibrillation - Continue home Tikosyn - Holding anticoagulation in the setting symptomatic anemia as below  Diabetes - SSI   Hypothyroidism - Continue home Synthroid  PTSD - Continue home BuSpar and duloxetine  Hyperlipidemia - Continue home statin  GERD - Continue home PPI  DVT prophylaxis: SCDs  Code Status:   Full  Family Communication:  None on admission  Disposition Plan:   Patient is from:  Home  Anticipated DC to:  Home  Anticipated DC date:  1 to 3 days  Anticipated DC barriers: None  Consults called:  None  Admission status:  Observation, telemetry   Severity of Illness: The appropriate patient status for this patient is OBSERVATION. Observation status is judged to be reasonable and necessary in order to provide the required intensity of service to ensure the patient's safety. The patient's presenting symptoms, physical exam findings, and initial radiographic and laboratory data in the context of their medical condition is felt to place them at decreased risk for further clinical deterioration. Furthermore, it is anticipated that the patient Hannah be medically stable for discharge from the hospital within 2 midnights of admission. The following factors support the patient status of observation.   " The patient's presenting symptoms include knee pain, bloody drainage, purulent drainage. " The physical exam findings include red warm swollen right knee,obese. " The initial radiographic and laboratory data are  Lab work-up showed BMP with creatinine stable at 1.2 and glucose 113.  CBC showed hemoglobin 5.7 down from baseline of 9.9.  PT 16.5 and INR 1.3 consistent with anticoagulation use.  BNP mildly elevated at 214, troponin negative x2, lactic acid negative x2, respiratory panel flu and COVID-negative.  Patient was typed and screened in the ED and blood cultures were obtained.  Chest x-ray showed cardiomegaly with pulmonary vascular  congestion, lumbar x-ray showed no acute normality but did show chronic degenerative disc disease, right knee x-ray showed severe  osteoarthritis but no acute abnormality.    Marcelyn Bruins MD [facility]  How to contact the [facility] Attending or Consulting provider [place] or covering provider during after hours [place], for this patient?   Check the care team in [facility] and look for a) attending/consulting [facility] provider listed and b) the [facility] team listed Log into [email or web address] and use [place]'s universal password to access. If you do not have the password, please contact the hospital operator. Locate the [facility] provider you are looking for under [facility] and page to a number that you can be directly reached. If you still have difficulty reaching the provider, please page the [facility] (Director on Call) for the Hospitalists listed on amion for assistance.  04/20/2021, 10:06 PM

## 2021-04-20 NOTE — ED Triage Notes (Addendum)
Pt states she fell on 8/31 and was seen in ED.  Since fall her R knee has continued to swell and have intermittent pain.  States her R knee "busted open" 1 week ago and has been draining.  Denies pain at present.  Denies fever and chills.    Also reports intermittent lower back pain and SOB with exertion since fall.  Reports history of Afib.

## 2021-04-20 NOTE — ED Notes (Signed)
Called transport for hospital bed ,

## 2021-04-20 NOTE — ED Notes (Signed)
Critical lab results- HGB 5.7.  [place] notified.

## 2021-04-20 NOTE — ED Provider Notes (Signed)
[facility] EMERGENCY DEPARTMENT Provider Note   CSN: [id number] Arrival date & time: 04/20/21  1605     History Chief Complaint  Patient presents with   Knee Pain   Wound Check   Shortness of Breath    Hannah [name] is a 75 y.o. female.  75 year old female with prior medical history as detailed below presents for evaluation.  Patient reports that she had a fall on August 31.  She had a contusion to the anterior right knee and right shin.  Apparently 2 weeks ago this area started to drain.  Initially there was significant blood draining per report.  Then there was purulent drainage.  Patient reports increased redness around the area.  She denies fever.  She reports increasing weakness over the last week or 2 as well.  The history is provided by the patient.  Illness Location:  Right shin pain and drainage, weakness Severity:  Moderate Onset quality:  Gradual Duration:  2 weeks Timing:  Constant Progression:  Worsening     Past Medical History:  Diagnosis Date   Atrial fibrillation ([place])    Diabetes mellitus without complication ([place])    Glaucoma    BILATERAL   Hypertension    Thyroid disease    Volume overload 11/2019    Patient Active Problem List   Diagnosis Date Noted   Secondary hypercoagulable state ([place]) 01/28/2021   Persistent atrial fibrillation ([place]) 01/28/2021   Acute on chronic diastolic heart failure ([place]) 11/24/2019   PAF (paroxysmal atrial fibrillation) ([place]) 11/24/2019   Hypothyroidism 11/24/2019   DMII (diabetes mellitus, type 2) ([place]) 11/24/2019   Acute respiratory failure ([place]) 12/07/2018   PTSD (post-traumatic stress disorder) 08/29/2017   Atrial fibrillation ([place]) [I48.91] 06/16/2017   Long term (current) use of anticoagulants [Z79.01] 06/16/2017   Chronic venous insufficiency 09/18/2016   CHF (congestive heart failure) ([place]) 09/13/2016   Community acquired pneumonia 09/13/2016   Cellulitis of right lower extremity  09/13/2016    Past Surgical History:  Procedure Laterality Date   APPENDECTOMY     ATRIAL FIBRILLATION ABLATION N/A 05/04/2020   Procedure: ATRIAL FIBRILLATION ABLATION;  Surgeon: Constance Haw, MD;  Location: [facility];  Service: Cardiovascular;  Laterality: N/A;   CARDIOVERSION  11/24/2019   CARDIOVERSION N/A 11/24/2019   Procedure: CARDIOVERSION;  Surgeon: Larey Dresser, MD;  Location: [facility] ENDOSCOPY;  Service: Cardiovascular;  Laterality: N/A;   CARDIOVERSION N/A 11/29/2019   Procedure: CARDIOVERSION;  Surgeon: Larey Dresser, MD;  Location: [facility] ENDOSCOPY;  Service: Cardiovascular;  Laterality: N/A;   CARDIOVERSION N/A 06/19/2020   Procedure: CARDIOVERSION;  Surgeon: Dorothy Spark, MD;  Location: [facility] ENDOSCOPY;  Service: Cardiovascular;  Laterality: N/A;   CHOLECYSTECTOMY     HERNIA REPAIR     UMBILICAL   LASIK     BILATERAL   TEE WITHOUT CARDIOVERSION N/A 11/24/2019   Procedure: TRANSESOPHAGEAL ECHOCARDIOGRAM (TEE);  Surgeon: Larey Dresser, MD;  Location: [facility] ENDOSCOPY;  Service: Cardiovascular;  Laterality: N/A;   TEE WITHOUT CARDIOVERSION N/A 05/03/2020   Procedure: TRANSESOPHAGEAL ECHOCARDIOGRAM (TEE);  Surgeon: Donato Heinz, MD;  Location: [facility] ENDOSCOPY;  Service: Cardiovascular;  Laterality: N/A;     OB History   No obstetric history on file.     Family History  Problem Relation Age of Onset   Lung cancer Mother    Colon cancer Father    Heart attack Brother    Heart disease Brother    Lung cancer Maternal Grandfather  Heart attack Paternal Grandfather     Social History   Tobacco Use   Smoking status: Never   Smokeless tobacco: Never  Vaping Use   Vaping Use: Never used  Substance Use Topics   Alcohol use: Yes    Alcohol/week: 1.0 standard drink    Types: 1 Glasses of wine per week    Comment: occ   Drug use: No    Home Medications Prior to Admission medications   Medication Sig Start Date End Date Taking?  Authorizing Provider  acetaminophen (TYLENOL) 500 MG tablet Take 500 mg by mouth every 6 (six) hours as needed for moderate pain.   Yes [provider]  apixaban (ELIQUIS) 5 MG TABS tablet Take 1 tablet (5 mg total) by mouth 2 (two) times daily. 11/05/20  Yes Camnitz, Will Hassell Done, MD  busPIRone (BUSPAR) 15 MG tablet TAKE 1 TABLET THREE TIMES A DAY 12/24/17  Yes Eksir, Richard Miu, MD  diclofenac Sodium (VOLTAREN) 1 % GEL Apply 2 g topically 4 (four) times daily. Patient taking differently: Apply 2 g topically 4 (four) times daily as needed (pain). 03/14/21  Yes Mickie Hillier, PA-C  dofetilide (TIKOSYN) 500 MCG capsule Take 1 capsule (500 mcg total) by mouth 2 (two) times daily. 02/07/21  Yes Fenton, Clint R, PA  DULoxetine (CYMBALTA) 30 MG capsule Take 3 capsules (90 mg total) by mouth daily. 11/05/17  Yes Eksir, Richard Miu, MD  empagliflozin (JARDIANCE) 10 MG TABS tablet Take 1 tablet (10 mg total) by mouth daily before breakfast. 02/15/21  Yes [place], Maricela Bo, FNP  esomeprazole (NEXIUM) 40 MG capsule Take 40 mg by mouth as needed (indigestion/heartburn).  11/14/19  Yes [provider]  levocetirizine (XYZAL) 5 MG tablet Take 5 mg by mouth at bedtime.    Yes [provider]  levothyroxine (SYNTHROID) 100 MCG tablet Take 100 mcg by mouth daily. 01/11/21  Yes [provider]  lidocaine (LIDODERM) 5 % Place 1 patch onto the skin daily. Remove & Discard patch within 12 hours or as directed by MD 03/14/21  Yes Mickie Hillier, PA-C  montelukast (SINGULAIR) 10 MG tablet Take 10 mg by mouth at bedtime.    Yes [provider]  Multiple Vitamins-Minerals (PRESERVISION AREDS) CAPS Take 1 capsule by mouth in the morning and at bedtime.   Yes [provider]  [facility] powder Apply 1 application topically as needed (rash under breasts).  03/19/18  Yes [provider]  potassium chloride SA (KLOR-CON) 20 MEQ tablet Take 20-40 mEq by mouth 2 (two) times  daily. She is taking 40 mEq in AM/20 mEq in PM   Yes [provider]  rosuvastatin (CRESTOR) 5 MG tablet Take 5 mg by mouth daily. 12/23/17  Yes [provider]  spironolactone (ALDACTONE) 25 MG tablet Take 0.5 tablets (12.5 mg total) by mouth daily. 01/31/21  Yes Shirley Friar, PA-C  torsemide (DEMADEX) 20 MG tablet TAKE THREE TABLETS BY MOUTH TWICE A DAY 03/11/21  Yes Larey Dresser, MD  triamcinolone cream (KENALOG) 0.1 % Apply 1 application topically daily as needed (rash). 11/14/19  Yes [provider]  zolpidem (AMBIEN) 10 MG tablet Take 10 mg by mouth at bedtime. 12/28/20  Yes [provider]  [place]. Devices (BARIATRIC ROLLATOR) MISC For home use 11/30/19   Consuelo Pandy, PA-C    Allergies    Azithromycin  Review of Systems   Review of Systems  All other systems reviewed and are negative.  Physical Exam  Updated Vital Signs BP 127/68 (BP Location: Left Wrist)   Pulse 71   Temp 98.2 F (36.8 C) (Oral)   Resp (!) 24   SpO2 98%   Physical Exam Vitals and nursing note reviewed.  Constitutional:      General: She is not in acute distress.    Appearance: Normal appearance. She is well-developed.  HENT:     Head: Normocephalic and atraumatic.  Eyes:     Conjunctiva/sclera: Conjunctivae normal.     Pupils: Pupils are equal, round, and reactive to light.  Cardiovascular:     Rate and Rhythm: Normal rate and regular rhythm.     Heart sounds: Normal heart sounds.  Pulmonary:     Effort: Pulmonary effort is normal. No respiratory distress.     Breath sounds: Normal breath sounds.  Abdominal:     General: There is no distension.     Palpations: Abdomen is soft.     Tenderness: There is no abdominal tenderness.  Musculoskeletal:        General: No deformity. Normal range of motion.     Cervical back: Normal range of motion and neck supple.     Right lower leg: Tenderness present. Edema present.     Left lower leg: Edema present.   Skin:    General: Skin is warm and dry.     Comments: Area of erythema to the anterior right shin with expressible purulence.  See photo below  Neurological:     General: No focal deficit present.     Mental Status: She is alert and oriented to person, place, and time.     ED Results / Procedures / Treatments   Labs (all labs ordered are listed, but only abnormal results are displayed) Labs Reviewed  CBC WITH DIFFERENTIAL/PLATELET - Abnormal; Notable for the following components:      Result Value   RBC 2.73 (*)    Hemoglobin 5.7 (*)    HCT 22.5 (*)    MCH 20.9 (*)    MCHC 25.3 (*)    RDW 19.8 (*)    All other components within normal limits  BASIC METABOLIC PANEL - Abnormal; Notable for the following components:   Glucose, Bld 113 (*)    Creatinine, Ser 1.26 (*)    GFR, Estimated 45 (*)    All other components within normal limits  BRAIN NATRIURETIC PEPTIDE - Abnormal; Notable for the following components:   B Natriuretic Peptide 214.3 (*)    All other components within normal limits  PROTIME-INR - Abnormal; Notable for the following components:   Prothrombin Time 16.5 (*)    INR 1.3 (*)    All other components within normal limits  RESP PANEL BY RT-PCR (FLU A&B, COVID) ARPGX2  CULTURE, BLOOD (ROUTINE X 2)  CULTURE, BLOOD (ROUTINE X 2)  LACTIC ACID, PLASMA  LACTIC ACID, PLASMA  TYPE AND SCREEN  ABO/RH  PREPARE RBC (CROSSMATCH)  TROPONIN I (HIGH SENSITIVITY)  TROPONIN I (HIGH SENSITIVITY)    EKG EKG Interpretation  Date/Time:  Saturday April 20 2021 16:39:09 EDT Ventricular Rate:  58 PR Interval:    QRS Duration: 106 QT Interval:  476 QTC Calculation: 467 R Axis:   36 Text Interpretation: Low voltage QRS Incomplete right bundle branch block Nonspecific ST abnormality Abnormal ECG Confirmed by Dene Gentry [phone]) on 04/20/2021 6:26:46 PM  Radiology DG Chest 2 View  Result Date: 04/20/2021 CLINICAL DATA:  Shortness of breath EXAM: CHEST - 2 VIEW  COMPARISON:  11/26/2019 FINDINGS:  Bilateral mild interstitial thickening. No focal consolidation. No pleural effusion or pneumothorax. Stable cardiomegaly. No acute osseous abnormality. IMPRESSION: Cardiomegaly with pulmonary vascular congestion. Electronically Signed   By: Kathreen Devoid M.D.   On: 04/20/2021 17:53   DG Lumbar Spine Complete  Result Date: 04/20/2021 CLINICAL DATA:  Pain post fall. EXAM: LUMBAR SPINE - COMPLETE 4+ VIEW COMPARISON:  None. FINDINGS: There is no evidence of lumbar spine fracture. Alignment is normal. Multilevel spondylosis and posterior facet arthropathy. IMPRESSION: 1. No acute fracture or dislocation identified about the lumbosacral spine. 2. Multilevel spondylosis and posterior facet arthropathy. Electronically Signed   By: Fidela Salisbury M.D.   On: 04/20/2021 17:47   DG Knee Complete 4 Views Right  Result Date: 04/20/2021 CLINICAL DATA:  Pain post fall. EXAM: RIGHT KNEE - COMPLETE 4+ VIEW COMPARISON:  None. FINDINGS: No evidence of fracture, dislocation, or joint effusion. Moderate to severe osteoarthritic changes, worse in the medial compartment. Soft tissues are unremarkable. IMPRESSION: Moderate to severe osteoarthritic changes, worse in the medial compartment. No evidence fracture or dislocation. Electronically Signed   By: Fidela Salisbury M.D.   On: 04/20/2021 17:49    Procedures Procedures   Medications Ordered in ED Medications  vancomycin (VANCOCIN) 2,500 mg in sodium chloride 0.9 % 500 mL IVPB (2,500 mg Intravenous New Bag/Given 04/20/21 2055)  ceFEPIme (MAXIPIME) 2 g in sodium chloride 0.9 % 100 mL IVPB (has no administration in time range)  vancomycin (VANCOREADY) IVPB 1250 mg/250 mL (has no administration in time range)  morphine 2 MG/ML injection 2 mg (has no administration in time range)  0.9 %  sodium chloride infusion (10 mL/hr Intravenous New Bag/Given 04/20/21 2111)    ED Course  I have reviewed the triage vital signs and the nursing  notes.  Pertinent labs & imaging results that were available during my care of the patient were reviewed by me and considered in my medical decision making (see chart for details).    MDM Rules/Calculators/A&P                           MDM  MSE complete  Hannah [name] was evaluated in Emergency Department on 04/20/2021 for the symptoms described in the history of present illness. She was evaluated in the context of the global COVID-19 pandemic, which necessitated consideration that the patient might be at risk for infection with the SARS-CoV-2 virus that causes COVID-19. Institutional protocols and algorithms that pertain to the evaluation of patients at risk for COVID-19 are in a state of rapid change based on information released by regulatory bodies including the CDC and federal and state organizations. These policies and algorithms were followed during the patient's care in the ED.  Patient is presenting with complaint of pain, redness, and drainage from the anterior right shin.  Patient with reported contusion that occurred several weeks prior.  Patient's presentation is consistent with likely hematoma that became infected.  Patient is noted to be anemic and likely symptomatic as well.  Patient with likely infection of the right lower extremity.  Broad-spectrum antibiotics initiated in the ED.  Patient would benefit from transfusion and concurrent diuresis.  Hospitalist services aware of case and will evaluate for admission.   Final Clinical Impression(s) / ED Diagnoses Final diagnoses:  Anemia, unspecified type  Cellulitis, unspecified cellulitis site    Rx / DC Orders ED Discharge Orders     None        Valarie Merino,  MD 04/20/21 2146

## 2021-04-20 NOTE — ED Provider Notes (Addendum)
Emergency Medicine Provider Triage Evaluation Note  Hannah [name] , a 75 y.o. female  was evaluated in triage.  Pt complains of multiple complaints.  Was seen here 8/31 for fall.  Patient states since then she has had swelling, redness, purulent drainage to her right anterior knee.  Apparently wound ruptured open last week however she is now having increased redness and warmth to this area.  She also admits to lower back pain which she states was not imaging on prior evaluation.  No bowel or bladder incontinence, saddle paresthesia, numbness.  No fevers, chills.  No new falls or injuries since prior assessment  She also reports shortness of breath and some chest tightness.  States she feels like her lower extremities are swelling, consistent with her CHF. She thinks she is taking her diuretics.  She is anticoagulated on Eliquis for her A. fib which she has been compliant with.  Review of Systems  Positive: Short of breath, chest tightness, right knee swelling, back pain Negative: Fever, chills, emesis, abdominal pain, numbness, weakness  Physical Exam  There were no vitals taken for this visit. Gen:   Awake, no distress   Resp:  Normal effort  MSK:   Moves extremities without difficulty, erythema, warmth, drainage to right anterior knee with 2 open wounds.  Midline lumbar tenderness without step-off or crepitus Other:    Medical Decision Making  Medically screening exam initiated at 4:31 PM.  Appropriate orders placed.  Shakeela [name] was informed that the remainder of the evaluation will be completed by another provider, this initial triage assessment does not replace that evaluation, and the importance of remaining in the ED until their evaluation is complete.  Right anterior knee pain, shortness of breath, chest tightness, low back pain       Bobbette Eakes A, PA-C 04/20/21 1638    Daleen Bo, MD 04/21/21 [phone]

## 2021-04-20 NOTE — Progress Notes (Signed)
Pharmacy Antibiotic Note  Hannah [name] is a 75 y.o. female admitted on 04/20/2021 with  wound infection .  Pharmacy has been consulted for cefepime and vancomycin dosing.  Patient with a history of T2DM, HTN, pAF (on eliquis), HFpEF, hypothyroidism. Patient presenting with swelling, redness, purulent drainage to her right anterior knee since a fall on 8/31.  SCr 1.26 - near BL WBC 8.5; LA 1.9; afeb  Plan: Cefepime 2 g q12h Vancomycin '2500mg'$  once then '1250mg'$  q24h (eAUC 527) unless change in renal function Trend WBC, fever, renal function, clinical course F/u cultures De-escalate when able Levels at steady state      Temp (24hrs), Avg:98.2 F (36.8 C), Min:98.2 F (36.8 C), Max:98.2 F (36.8 C)  Recent Labs  Lab 04/20/21 1631  WBC 8.5  CREATININE 1.26*    CrCl cannot be calculated (Unknown ideal weight.).    Allergies  Allergen Reactions   Azithromycin Rash and Other (See Comments)    Severe rash   Antimicrobials this admission: cefepime 10/8 >>  vancomycin 10/8 >>   Microbiology results: Pending  Thank you for allowing pharmacy to be a part of this patient's care.  Heloise Purpura 04/20/2021 7:19 PM

## 2021-04-21 ENCOUNTER — Inpatient Hospital Stay (HOSPITAL_COMMUNITY): Payer: PPO

## 2021-04-21 DIAGNOSIS — D649 Anemia, unspecified: Secondary | ICD-10-CM | POA: Diagnosis not present

## 2021-04-21 DIAGNOSIS — I130 Hypertensive heart and chronic kidney disease with heart failure and stage 1 through stage 4 chronic kidney disease, or unspecified chronic kidney disease: Secondary | ICD-10-CM | POA: Diagnosis present

## 2021-04-21 DIAGNOSIS — L03115 Cellulitis of right lower limb: Secondary | ICD-10-CM | POA: Diagnosis present

## 2021-04-21 DIAGNOSIS — F4310 Post-traumatic stress disorder, unspecified: Secondary | ICD-10-CM | POA: Diagnosis present

## 2021-04-21 DIAGNOSIS — E669 Obesity, unspecified: Secondary | ICD-10-CM | POA: Diagnosis present

## 2021-04-21 DIAGNOSIS — Z7901 Long term (current) use of anticoagulants: Secondary | ICD-10-CM | POA: Diagnosis not present

## 2021-04-21 DIAGNOSIS — H409 Unspecified glaucoma: Secondary | ICD-10-CM | POA: Diagnosis present

## 2021-04-21 DIAGNOSIS — W1830XA Fall on same level, unspecified, initial encounter: Secondary | ICD-10-CM | POA: Diagnosis present

## 2021-04-21 DIAGNOSIS — I5032 Chronic diastolic (congestive) heart failure: Secondary | ICD-10-CM | POA: Diagnosis present

## 2021-04-21 DIAGNOSIS — D631 Anemia in chronic kidney disease: Secondary | ICD-10-CM | POA: Diagnosis present

## 2021-04-21 DIAGNOSIS — L02415 Cutaneous abscess of right lower limb: Secondary | ICD-10-CM | POA: Diagnosis present

## 2021-04-21 DIAGNOSIS — Z79899 Other long term (current) drug therapy: Secondary | ICD-10-CM | POA: Diagnosis not present

## 2021-04-21 DIAGNOSIS — Z6841 Body Mass Index (BMI) 40.0 and over, adult: Secondary | ICD-10-CM | POA: Diagnosis not present

## 2021-04-21 DIAGNOSIS — R7989 Other specified abnormal findings of blood chemistry: Secondary | ICD-10-CM | POA: Diagnosis present

## 2021-04-21 DIAGNOSIS — Z8249 Family history of ischemic heart disease and other diseases of the circulatory system: Secondary | ICD-10-CM | POA: Diagnosis not present

## 2021-04-21 DIAGNOSIS — L0390 Cellulitis, unspecified: Secondary | ICD-10-CM

## 2021-04-21 DIAGNOSIS — Z20822 Contact with and (suspected) exposure to covid-19: Secondary | ICD-10-CM | POA: Diagnosis present

## 2021-04-21 DIAGNOSIS — M009 Pyogenic arthritis, unspecified: Secondary | ICD-10-CM | POA: Diagnosis present

## 2021-04-21 DIAGNOSIS — Z79890 Hormone replacement therapy (postmenopausal): Secondary | ICD-10-CM | POA: Diagnosis not present

## 2021-04-21 DIAGNOSIS — E1122 Type 2 diabetes mellitus with diabetic chronic kidney disease: Secondary | ICD-10-CM | POA: Diagnosis present

## 2021-04-21 DIAGNOSIS — Z7984 Long term (current) use of oral hypoglycemic drugs: Secondary | ICD-10-CM | POA: Diagnosis not present

## 2021-04-21 DIAGNOSIS — E039 Hypothyroidism, unspecified: Secondary | ICD-10-CM | POA: Diagnosis present

## 2021-04-21 DIAGNOSIS — I482 Chronic atrial fibrillation, unspecified: Secondary | ICD-10-CM | POA: Diagnosis present

## 2021-04-21 DIAGNOSIS — Z881 Allergy status to other antibiotic agents status: Secondary | ICD-10-CM | POA: Diagnosis not present

## 2021-04-21 DIAGNOSIS — E1151 Type 2 diabetes mellitus with diabetic peripheral angiopathy without gangrene: Secondary | ICD-10-CM | POA: Diagnosis present

## 2021-04-21 DIAGNOSIS — S8001XA Contusion of right knee, initial encounter: Secondary | ICD-10-CM | POA: Diagnosis present

## 2021-04-21 DIAGNOSIS — N1832 Chronic kidney disease, stage 3b: Secondary | ICD-10-CM | POA: Diagnosis present

## 2021-04-21 DIAGNOSIS — B9562 Methicillin resistant Staphylococcus aureus infection as the cause of diseases classified elsewhere: Secondary | ICD-10-CM | POA: Diagnosis present

## 2021-04-21 LAB — CBC
HCT: 23.1 % — ABNORMAL LOW (ref 36.0–46.0)
HCT: 25.4 % — ABNORMAL LOW (ref 36.0–46.0)
Hemoglobin: 6.6 g/dL — CL (ref 12.0–15.0)
Hemoglobin: 7.4 g/dL — ABNORMAL LOW (ref 12.0–15.0)
MCH: 23.7 pg — ABNORMAL LOW (ref 26.0–34.0)
MCH: 23.8 pg — ABNORMAL LOW (ref 26.0–34.0)
MCHC: 28.6 g/dL — ABNORMAL LOW (ref 30.0–36.0)
MCHC: 29.1 g/dL — ABNORMAL LOW (ref 30.0–36.0)
MCV: 82.8 fL (ref 80.0–100.0)
MCV: 81.7 fL (ref 80.0–100.0)
Platelets: 305 10*3/uL (ref 150–400)
Platelets: 338 10*3/uL (ref 150–400)
RBC: 2.79 MIL/uL — ABNORMAL LOW (ref 3.87–5.11)
RBC: 3.11 MIL/uL — ABNORMAL LOW (ref 3.87–5.11)
RDW: 19.1 % — ABNORMAL HIGH (ref 11.5–15.5)
RDW: 19.2 % — ABNORMAL HIGH (ref 11.5–15.5)
WBC: 7.1 10*3/uL (ref 4.0–10.5)
WBC: 7.5 10*3/uL (ref 4.0–10.5)
nRBC: 0.3 % — ABNORMAL HIGH (ref 0.0–0.2)
nRBC: 0 % (ref 0.0–0.2)

## 2021-04-21 LAB — CBG MONITORING, ED
Glucose-Capillary: 105 mg/dL — ABNORMAL HIGH (ref 70–99)
Glucose-Capillary: 133 mg/dL — ABNORMAL HIGH (ref 70–99)

## 2021-04-21 LAB — BASIC METABOLIC PANEL
Anion gap: 8 (ref 5–15)
BUN: 21 mg/dL (ref 8–23)
CO2: 24 mmol/L (ref 22–32)
Calcium: 8.5 mg/dL — ABNORMAL LOW (ref 8.9–10.3)
Chloride: 106 mmol/L (ref 98–111)
Creatinine, Ser: 1.27 mg/dL — ABNORMAL HIGH (ref 0.44–1.00)
GFR, Estimated: 44 mL/min — ABNORMAL LOW (ref >60)
Glucose, Bld: 176 mg/dL — ABNORMAL HIGH (ref 70–99)
Potassium: 4.4 mmol/L (ref 3.5–5.1)
Sodium: 138 mmol/L (ref 135–145)

## 2021-04-21 LAB — GLUCOSE, CAPILLARY
Glucose-Capillary: 122 mg/dL — ABNORMAL HIGH (ref 70–99)
Glucose-Capillary: 127 mg/dL — ABNORMAL HIGH (ref 70–99)

## 2021-04-21 LAB — MAGNESIUM: Magnesium: 2.2 mg/dL (ref 1.7–2.4)

## 2021-04-21 MED ORDER — HYDROCODONE-ACETAMINOPHEN 5-325 MG PO TABS
1.0000 | ORAL_TABLET | Freq: Once | ORAL | Status: AC | PRN
  Administered 2021-04-21: 1 via ORAL
  Filled 2021-04-21: qty 1

## 2021-04-21 MED ORDER — RAMELTEON 8 MG PO TABS
8.0000 mg | ORAL_TABLET | Freq: Every day | ORAL | Status: DC
  Administered 2021-04-21 – 2021-04-25 (×6): 8 mg via ORAL
  Filled 2021-04-21 (×8): qty 1

## 2021-04-21 MED ORDER — TRAMADOL HCL 50 MG PO TABS
50.0000 mg | ORAL_TABLET | Freq: Once | ORAL | Status: AC
  Administered 2021-04-21: 50 mg via ORAL
  Filled 2021-04-21: qty 1

## 2021-04-21 MED ORDER — MORPHINE SULFATE (PF) 2 MG/ML IV SOLN
2.0000 mg | Freq: Once | INTRAVENOUS | Status: AC
Start: 2021-04-21 — End: 2021-04-21
  Administered 2021-04-21: 2 mg via INTRAVENOUS
  Filled 2021-04-21: qty 1

## 2021-04-21 NOTE — Progress Notes (Signed)
PROGRESS NOTE    Hannah [name]  SD:[id number] DOB: Aug 14, 1945 DOA: 04/20/2021 PCP: Lujean Amel, MD    Brief Narrative:  75 y.o. female with medical history significant of CHF, atrial fibrillation, cellulitis, venous insufficiency, diabetes, hypothyroidism, PTSD who presents with drainage from her knee. Pt described R knee effusion following mechanical fall in 8/22 with worsening effusion afterwards. Spontaneous self-draining of R knee noted 2 weeks prior to this hospital visit described as mostly bloody, but also purulent as well  Assessment & Plan:   Principal Problem:   Symptomatic anemia Active Problems:   CHF (congestive heart failure) (HCC)   PTSD (post-traumatic stress disorder)   PAF (paroxysmal atrial fibrillation) (HCC)   Hypothyroidism   DMII (diabetes mellitus, type 2) (HCC)   Cellulitis    Symptomatic anemia > Patient noted to have hemoglobin of 4.7 down from baseline 9.9.  In the setting of hx of large knee hematoma with significant blood drainage - 2 units of pRBCs initially given in ED - FOBT pending, pt denies bloody or melenotic stools - Eliquis held at time of presentation - Cont to follow hgb trends and cont to transfuse as needed   Likely septic R knee with Cellulitis > History of fall and knee with significant blood drainage as above followed by significant pus with erythema and warmth around the knee. -R knee markedly tender with draining effusion, purulence noted on overlying pad - increased erythema noted over R shin below - Pt is continued on empiric vanc and cefepime - given concerns of septic R knee, have consulted Orthopedic Surgery, will f/u on recs - Will f/u on cultures. Thus far, blood cx neg x <24hrs   CHF > Mildly elevated BNP at 214, having some symptoms of shortness of breath and chest tightness however this is likely related to her symptomatic anemia. > appears euvolemic at this time   Atrial fibrillation - Continue home  Tikosyn - eliquis currently on hold  Diabetes - SSI   Hypothyroidism -Most recent TSH of 5.705 from 06/04/20 -Will repeat TSH - Continue home Synthroid  PTSD - Continue home BuSpar and duloxetine  Hyperlipidemia - Continue home statin  GERD - Continue home PPI   DVT prophylaxis: SCD's Code Status: Full Family Communication: Pt in room, family at bedside  Status is: Observation  The patient will require care spanning > 2 midnights and should be moved to inpatient because: Inpatient level of care appropriate due to severity of illness  Dispo: The patient is from: Home              Anticipated d/c is to: Home              Patient currently is not medically stable to d/c.   Difficult to place patient No   Consultants:  Orthopedic Surgery  Procedures:    Antimicrobials: Anti-infectives (From admission, onward)    Start     Dose/Rate Route Frequency Ordered Stop   04/21/21 2200  vancomycin (VANCOREADY) IVPB 1250 mg/250 mL        1,250 mg 166.7 mL/hr over 90 Minutes Intravenous Every 24 hours 04/20/21 2018     04/21/21 1000  ceFEPIme (MAXIPIME) 2 g in sodium chloride 0.9 % 100 mL IVPB        2 g 200 mL/hr over 30 Minutes Intravenous Every 12 hours 04/20/21 2018     04/20/21 1930  ceFEPIme (MAXIPIME) 2 g in sodium chloride 0.9 % 100 mL IVPB  Status:  Discontinued  2 g 200 mL/hr over 30 Minutes Intravenous Every 24 hours 04/20/21 1919 04/20/21 2018   04/20/21 1930  vancomycin (VANCOCIN) 2,500 mg in sodium chloride 0.9 % 500 mL IVPB        2,500 mg 250 mL/hr over 120 Minutes Intravenous  Once 04/20/21 1919 04/20/21 2325       Subjective: Complaining of continued R knee pain and drainage  Objective: Vitals:   04/21/21 1115 04/21/21 1130 04/21/21 1145 04/21/21 1200  BP: (!) 117/54 112/70 120/85 119/63  Pulse: (!) 40 (!) 55 (!) 55 (!) 58  Resp:  17    Temp:      TempSrc:      SpO2: 94% 100% 100% 98%    Intake/Output Summary (Last 24 hours) at  04/21/2021 1420 Last data filed at 04/21/2021 0900 Gross per 24 hour  Intake --  Output 1000 ml  Net -1000 ml   There were no vitals filed for this visit.  Examination: General exam: Awake, laying in bed, in nad Respiratory system: Normal respiratory effort, no wheezing Cardiovascular system: regular rate, s1, s2 Gastrointestinal system: Soft, nondistended, positive BS Central nervous system: CN2-12 grossly intact, strength intact Extremities: Perfused, R knee erythematous, markedly tender with two areas of active drainage Skin: Normal skin turgor, erythema and warmth over R shin Psychiatry: Mood normal // no visual hallucinations   Data Reviewed: I have personally reviewed following labs and imaging studies  CBC: Recent Labs  Lab 04/20/21 1631 04/21/21 0505  WBC 8.5 7.1  NEUTROABS 7.0  --   HGB 5.7* 6.6*  HCT 22.5* 23.1*  MCV 82.4 82.8  PLT 357 [id number]   Basic Metabolic Panel: Recent Labs  Lab 04/20/21 1631 04/21/21 0600  NA 139 138  K 4.6 4.4  CL 107 106  CO2 23 24  GLUCOSE 113* 176*  BUN 22 21  CREATININE 1.26* 1.27*  CALCIUM 9.1 8.5*  MG  --  2.2   GFR: CrCl cannot be calculated (Unknown ideal weight.). Liver Function Tests: No results for input(s): AST, ALT, ALKPHOS, BILITOT, PROT, ALBUMIN in the last 168 hours. No results for input(s): LIPASE, AMYLASE in the last 168 hours. No results for input(s): AMMONIA in the last 168 hours. Coagulation Profile: Recent Labs  Lab 04/20/21 1848  INR 1.3*   Cardiac Enzymes: No results for input(s): CKTOTAL, CKMB, CKMBINDEX, TROPONINI in the last 168 hours. BNP (last 3 results) No results for input(s): PROBNP in the last 8760 hours. HbA1C: No results for input(s): HGBA1C in the last 72 hours. CBG: Recent Labs  Lab 04/21/21 0743 04/21/21 1138  GLUCAP 105* 133*   Lipid Profile: No results for input(s): CHOL, HDL, LDLCALC, TRIG, CHOLHDL, LDLDIRECT in the last 72 hours. Thyroid Function Tests: No results for  input(s): TSH, T4TOTAL, FREET4, T3FREE, THYROIDAB in the last 72 hours. Anemia Panel: No results for input(s): VITAMINB12, FOLATE, FERRITIN, TIBC, IRON, RETICCTPCT in the last 72 hours. Sepsis Labs: Recent Labs  Lab 04/20/21 1848 04/20/21 2005  LATICACIDVEN 1.9 1.4    Recent Results (from the past 240 hour(s))  Resp Panel by RT-PCR (Flu A&B, Covid)     Status: None   Collection Time: 04/20/21  6:48 PM   Specimen: Nasopharyngeal(NP) swabs in vial transport medium  Result Value Ref Range Status   SARS Coronavirus 2 by RT PCR NEGATIVE NEGATIVE Final    Comment: (NOTE) SARS-CoV-2 target nucleic acids are NOT DETECTED.  The SARS-CoV-2 RNA is generally detectable in upper respiratory specimens during the acute phase  of infection. The lowest concentration of SARS-CoV-2 viral copies this assay can detect is 138 copies/mL. A negative result does not preclude SARS-Cov-2 infection and should not be used as the sole basis for treatment or other patient management decisions. A negative result may occur with  improper specimen collection/handling, submission of specimen other than nasopharyngeal swab, presence of viral mutation(s) within the areas targeted by this assay, and inadequate number of viral copies(<138 copies/mL). A negative result must be combined with clinical observations, patient history, and epidemiological information. The expected result is Negative.  Fact Sheet for Patients:  EntrepreneurPulse.com.au  Fact Sheet for Healthcare Providers:  IncredibleEmployment.be  This test is no t yet approved or cleared by the [place] FDA and  has been authorized for detection and/or diagnosis of SARS-CoV-2 by FDA under an Emergency Use Authorization (EUA). This EUA will remain  in effect (meaning this test can be used) for the duration of the COVID-19 declaration under Section 564(b)(1) of the Act, 21 U.S.C.section 360bbb-3(b)(1), unless the  authorization is terminated  or revoked sooner.       Influenza A by PCR NEGATIVE NEGATIVE Final   Influenza B by PCR NEGATIVE NEGATIVE Final    Comment: (NOTE) The Xpert Xpress SARS-CoV-2/FLU/RSV plus assay is intended as an aid in the diagnosis of influenza from Nasopharyngeal swab specimens and should not be used as a sole basis for treatment. Nasal washings and aspirates are unacceptable for Xpert Xpress SARS-CoV-2/FLU/RSV testing.  Fact Sheet for Patients: EntrepreneurPulse.com.au  Fact Sheet for Healthcare Providers: IncredibleEmployment.be  This test is not yet approved or cleared by the [place] FDA and has been authorized for detection and/or diagnosis of SARS-CoV-2 by FDA under an Emergency Use Authorization (EUA). This EUA will remain in effect (meaning this test can be used) for the duration of the COVID-19 declaration under Section 564(b)(1) of the Act, 21 U.S.C. section 360bbb-3(b)(1), unless the authorization is terminated or revoked.  Performed at [facility], [place] [address] [place], [place] [address]   Culture, blood (routine x 2)     Status: None (Preliminary result)   Collection Time: 04/20/21  6:48 PM   Specimen: BLOOD  Result Value Ref Range Status   Specimen Description BLOOD LEFT ANTECUBITAL  Final   Special Requests   Final    BOTTLES DRAWN AEROBIC AND ANAEROBIC Blood Culture adequate volume   Culture   Final    NO GROWTH < 24 HOURS Performed at [facility], [place] [address], [place], [place] [address]    Report Status PENDING  Incomplete  Culture, blood (routine x 2)     Status: None (Preliminary result)   Collection Time: 04/20/21  6:52 PM   Specimen: BLOOD LEFT WRIST  Result Value Ref Range Status   Specimen Description BLOOD LEFT WRIST  Final   Special Requests   Final    BOTTLES DRAWN AEROBIC AND ANAEROBIC Blood Culture results may not be optimal due to an inadequate volume of blood  received in culture bottles   Culture   Final    NO GROWTH < 24 HOURS Performed at [facility], [place] [address] [place], [place] [address]    Report Status PENDING  Incomplete     Radiology Studies: DG Chest 2 View  Result Date: 04/20/2021 CLINICAL DATA:  Shortness of breath EXAM: CHEST - 2 VIEW COMPARISON:  11/26/2019 FINDINGS: Bilateral mild interstitial thickening. No focal consolidation. No pleural effusion or pneumothorax. Stable cardiomegaly. No acute osseous  abnormality. IMPRESSION: Cardiomegaly with pulmonary vascular congestion. Electronically Signed   By: Kathreen Devoid M.D.   On: 04/20/2021 17:53   DG Lumbar Spine Complete  Result Date: 04/20/2021 CLINICAL DATA:  Pain post fall. EXAM: LUMBAR SPINE - COMPLETE 4+ VIEW COMPARISON:  None. FINDINGS: There is no evidence of lumbar spine fracture. Alignment is normal. Multilevel spondylosis and posterior facet arthropathy. IMPRESSION: 1. No acute fracture or dislocation identified about the lumbosacral spine. 2. Multilevel spondylosis and posterior facet arthropathy. Electronically Signed   By: Fidela Salisbury M.D.   On: 04/20/2021 17:47   DG Knee Complete 4 Views Right  Result Date: 04/20/2021 CLINICAL DATA:  Pain post fall. EXAM: RIGHT KNEE - COMPLETE 4+ VIEW COMPARISON:  None. FINDINGS: No evidence of fracture, dislocation, or joint effusion. Moderate to severe osteoarthritic changes, worse in the medial compartment. Soft tissues are unremarkable. IMPRESSION: Moderate to severe osteoarthritic changes, worse in the medial compartment. No evidence fracture or dislocation. Electronically Signed   By: Fidela Salisbury M.D.   On: 04/20/2021 17:49    Scheduled Meds:  busPIRone  15 mg Oral TID   dofetilide  500 mcg Oral BID   DULoxetine  90 mg Oral Daily   insulin aspart  0-15 Units Subcutaneous TID WC   levothyroxine  100 mcg Oral Q0600   montelukast  10 mg Oral QHS    morphine injection  2 mg Intravenous Once   pantoprazole   40 mg Oral Daily   ramelteon  8 mg Oral QHS   rosuvastatin  5 mg Oral Daily   sodium chloride flush  3 mL Intravenous Q12H   spironolactone  12.5 mg Oral Daily   torsemide  60 mg Oral BID   Continuous Infusions:  ceFEPime (MAXIPIME) IV Stopped (04/21/21 1014)   vancomycin       LOS: 0 days   Marylu Lund, MD [facility] Pager On Amion  If 7PM-7AM, please contact night-coverage 04/21/2021, 2:20 PM

## 2021-04-21 NOTE — ED Notes (Signed)
Lunch ordered 

## 2021-04-21 NOTE — Consult Note (Signed)
ORTHOPAEDIC CONSULTATION  REQUESTING PHYSICIAN: Donne Hazel, MD  Chief Complaint: Right knee pain and drainage  HPI:  Hannah [name] is a 75 y.o. female who complains of Right knee pain that started after a fall she sustained on August 31.  She developed a bruise over the front of the knee that 2 weeks ago opened up and started draining bloody and purulent fluid.  She presented to the ER yesterday for further evaluation.  Orthopedics was consulted to rule out a septic knee arthritis.  She also has severe lymphedema bilateral lower extremities.  Past Medical History:  Diagnosis Date   Atrial fibrillation ([place])    Diabetes mellitus without complication ([place])    Glaucoma    BILATERAL   Hypertension    Thyroid disease    Volume overload 11/2019   Past Surgical History:  Procedure Laterality Date   APPENDECTOMY     ATRIAL FIBRILLATION ABLATION N/A 05/04/2020   Procedure: ATRIAL FIBRILLATION ABLATION;  Surgeon: Constance Haw, MD;  Location: [facility];  Service: Cardiovascular;  Laterality: N/A;   CARDIOVERSION  11/24/2019   CARDIOVERSION N/A 11/24/2019   Procedure: CARDIOVERSION;  Surgeon: Larey Dresser, MD;  Location: [facility] ENDOSCOPY;  Service: Cardiovascular;  Laterality: N/A;   CARDIOVERSION N/A 11/29/2019   Procedure: CARDIOVERSION;  Surgeon: Larey Dresser, MD;  Location: [facility] ENDOSCOPY;  Service: Cardiovascular;  Laterality: N/A;   CARDIOVERSION N/A 06/19/2020   Procedure: CARDIOVERSION;  Surgeon: Dorothy Spark, MD;  Location: [facility] ENDOSCOPY;  Service: Cardiovascular;  Laterality: N/A;   CHOLECYSTECTOMY     HERNIA REPAIR     UMBILICAL   LASIK     BILATERAL   TEE WITHOUT CARDIOVERSION N/A 11/24/2019   Procedure: TRANSESOPHAGEAL ECHOCARDIOGRAM (TEE);  Surgeon: Larey Dresser, MD;  Location: [facility] ENDOSCOPY;  Service: Cardiovascular;  Laterality: N/A;   TEE WITHOUT CARDIOVERSION N/A 05/03/2020   Procedure: TRANSESOPHAGEAL ECHOCARDIOGRAM (TEE);  Surgeon:  Donato Heinz, MD;  Location[facility] ENDOSCOPY;  Service: Cardiovascular;  Laterality: N/A;   Social History   Socioeconomic History   Marital status: Divorced    Spouse name: Not on file   Number of children: Not on file   Years of education: Not on file   Highest education level: Not on file  Occupational History   Not on file  Tobacco Use   Smoking status: Never   Smokeless tobacco: Never  Vaping Use   Vaping Use: Never used  Substance and Sexual Activity   Alcohol use: Yes    Alcohol/week: 1.0 standard drink    Types: 1 Glasses of wine per week    Comment: occ   Drug use: No   Sexual activity: Not Currently  Other Topics Concern   Not on file  Social History Narrative   Not on file   Social Determinants of Health   Financial Resource Strain: Not on file  Food Insecurity: Not on file  Transportation Needs: Not on file  Physical Activity: Not on file  Stress: Not on file  Social Connections: Not on file   Family History  Problem Relation Age of Onset   Lung cancer Mother    Colon cancer Father    Heart attack Brother    Heart disease Brother    Lung cancer Maternal Grandfather    Heart attack Paternal Grandfather    Allergies  Allergen Reactions   Azithromycin Rash and Other (See Comments)    Severe rash     Positive ROS: All other systems  have been reviewed and were otherwise negative with the exception of those mentioned in the HPI and as above.  Physical Exam: General: Alert, no acute distress Cardiovascular: No pedal edema Respiratory: No cyanosis, no use of accessory musculature GI: No organomegaly, abdomen is soft and non-tender Skin: Small superficial abrasions of the anterior aspect of the knee with surrounding erythema over the anterior knee.  Also significant erythema healing wounds over the anterior calf Neurologic: Sensation intact distally Psychiatric: Patient is competent for consent with normal mood and affect Lymphatic: Severe  lymphedema bilateral lower extremities  MUSCULOSKELETAL:  LLE No traumatic wounds, ecchymosis, or rash  Nontender  No knee or ankle effusion  Knee stable to varus/ valgus  Sens DPN, SPN, TN intact  Motor EHL, ext, flex, evers 5/5    RLE Erythema and small superficial abrasions with granulation tissue and no active drainage over the anterior knee, significant erythema also distally over the anterior calf consistent with lymphedema  Tender over the anterior knee  Tolerates knee range of motion 5 to 100 degrees with minimal discomfort  Knee stable to varus/ valgus   Motor EHL, ext, flex, evers 5/5      IMAGING: X-rays of the left knee demonstrate no fracture or dislocation.  Severe degenerative changes medial compartment arthritis  Assessment: Principal Problem:   Symptomatic anemia Active Problems:   CHF (congestive heart failure) (HCC)   PTSD (post-traumatic stress disorder)   PAF (paroxysmal atrial fibrillation) (HCC)   Hypothyroidism   DMII (diabetes mellitus, type 2) (HCC)   Cellulitis  Anterior knee wound with surrounding erythema Low suspicion for septic arthritis of the knee given the focal erythema to the anterior knee and good range of motion with minimal discomfort, but given the history and duration of symptoms a knee aspiration was attempted..  Using an 18-gauge spinal needle the superolateral aspect of the knee away from the erythema was prepped with ChloraPrep.  The spinal needle was inserted into the knee underneath the patella.  Despite attempts to reposition the needle were unable to aspirate any fluid from the knee, thus yielding a dry tap.  A sterile dressing was applied.  Plan: Examined negative aspiration times reassuring against septic arthritis.  The patient has a draining wound over the anterior knee, and may be a communicating abscess that per history sounds like he has been notably decompressed.  Would consider a contrast CT or MRI to evaluate in case  there is another abscess that has not been decompressed that would require surgical debridement.  We will follow-up the imaging study to discuss further management.    Willaim Sheng, MD Cell [phone]

## 2021-04-22 DIAGNOSIS — L8990 Pressure ulcer of unspecified site, unspecified stage: Secondary | ICD-10-CM | POA: Insufficient documentation

## 2021-04-22 DIAGNOSIS — L0390 Cellulitis, unspecified: Secondary | ICD-10-CM | POA: Diagnosis not present

## 2021-04-22 DIAGNOSIS — D649 Anemia, unspecified: Secondary | ICD-10-CM | POA: Diagnosis not present

## 2021-04-22 LAB — GLUCOSE, CAPILLARY
Glucose-Capillary: 113 mg/dL — ABNORMAL HIGH (ref 70–99)
Glucose-Capillary: 161 mg/dL — ABNORMAL HIGH (ref 70–99)
Glucose-Capillary: 172 mg/dL — ABNORMAL HIGH (ref 70–99)
Glucose-Capillary: 126 mg/dL — ABNORMAL HIGH (ref 70–99)
Glucose-Capillary: 133 mg/dL — ABNORMAL HIGH (ref 70–99)

## 2021-04-22 LAB — BPAM RBC
Blood Product Expiration Date: 202210312359
Blood Product Expiration Date: 202211022359
ISSUE DATE / TIME: 202210082147
ISSUE DATE / TIME: 202210090019
Unit Type and Rh: 6200
Unit Type and Rh: 6200

## 2021-04-22 LAB — TYPE AND SCREEN
ABO/RH(D): AB POS
Antibody Screen: NEGATIVE
Unit division: 0
Unit division: 0

## 2021-04-22 LAB — COMPREHENSIVE METABOLIC PANEL
ALT: 12 U/L (ref 0–44)
AST: 10 U/L — ABNORMAL LOW (ref 15–41)
Albumin: 2.5 g/dL — ABNORMAL LOW (ref 3.5–5.0)
Alkaline Phosphatase: 81 U/L (ref 38–126)
Anion gap: 10 (ref 5–15)
BUN: 25 mg/dL — ABNORMAL HIGH (ref 8–23)
CO2: 26 mmol/L (ref 22–32)
Calcium: 8.5 mg/dL — ABNORMAL LOW (ref 8.9–10.3)
Chloride: 103 mmol/L (ref 98–111)
Creatinine, Ser: 1.47 mg/dL — ABNORMAL HIGH (ref 0.44–1.00)
GFR, Estimated: 37 mL/min — ABNORMAL LOW (ref >60)
Glucose, Bld: 106 mg/dL — ABNORMAL HIGH (ref 70–99)
Potassium: 3.6 mmol/L (ref 3.5–5.1)
Sodium: 139 mmol/L (ref 135–145)
Total Bilirubin: 1.3 mg/dL — ABNORMAL HIGH (ref 0.3–1.2)
Total Protein: 7.2 g/dL (ref 6.5–8.1)

## 2021-04-22 LAB — CBC
HCT: 24.5 % — ABNORMAL LOW (ref 36.0–46.0)
Hemoglobin: 7.1 g/dL — ABNORMAL LOW (ref 12.0–15.0)
MCH: 23.8 pg — ABNORMAL LOW (ref 26.0–34.0)
MCHC: 29 g/dL — ABNORMAL LOW (ref 30.0–36.0)
MCV: 82.2 fL (ref 80.0–100.0)
Platelets: 305 10*3/uL (ref 150–400)
RBC: 2.98 MIL/uL — ABNORMAL LOW (ref 3.87–5.11)
RDW: 19.5 % — ABNORMAL HIGH (ref 11.5–15.5)
WBC: 9 10*3/uL (ref 4.0–10.5)
nRBC: 0 % (ref 0.0–0.2)

## 2021-04-22 MED ORDER — POTASSIUM CHLORIDE CRYS ER 10 MEQ PO TBCR
EXTENDED_RELEASE_TABLET | ORAL | Status: AC
  Filled 2021-04-22: qty 1

## 2021-04-22 MED ORDER — VANCOMYCIN HCL IN DEXTROSE 1-5 GM/200ML-% IV SOLN
1000.0000 mg | INTRAVENOUS | Status: DC
  Administered 2021-04-23 – 2021-04-25 (×3): 1000 mg via INTRAVENOUS
  Filled 2021-04-22 (×4): qty 200

## 2021-04-22 MED ORDER — ALUM & MAG HYDROXIDE-SIMETH 200-200-20 MG/5ML PO SUSP
30.0000 mL | Freq: Four times a day (QID) | ORAL | Status: DC | PRN
  Administered 2021-04-22: 30 mL via ORAL

## 2021-04-22 MED ORDER — HYDROCODONE-ACETAMINOPHEN 5-325 MG PO TABS
1.0000 | ORAL_TABLET | Freq: Four times a day (QID) | ORAL | Status: AC | PRN
  Administered 2021-04-22 (×4): 1 via ORAL
  Filled 2021-04-22 (×4): qty 1

## 2021-04-22 MED ORDER — POTASSIUM CHLORIDE CRYS ER 20 MEQ PO TBCR
40.0000 meq | EXTENDED_RELEASE_TABLET | Freq: Once | ORAL | Status: AC
  Administered 2021-04-22: 40 meq via ORAL
  Filled 2021-04-22: qty 2

## 2021-04-22 NOTE — Progress Notes (Signed)
PROGRESS NOTE    Hannah [name]  [id number] DOB: 1945/09/09 DOA: 04/20/2021 PCP: Lujean Amel, MD    Brief Narrative:  75 y.o. female with medical history significant of CHF, atrial fibrillation, cellulitis, venous insufficiency, diabetes, hypothyroidism, PTSD who presents with drainage from her knee. Pt described R knee effusion following mechanical fall in 8/22 with worsening effusion afterwards. Spontaneous self-draining of R knee noted 2 weeks prior to this hospital visit described as mostly bloody, but also purulent as well  Assessment & Plan:   Principal Problem:   Symptomatic anemia Active Problems:   CHF (congestive heart failure) (HCC)   PTSD (post-traumatic stress disorder)   PAF (paroxysmal atrial fibrillation) (HCC)   Hypothyroidism   DMII (diabetes mellitus, type 2) (HCC)   Cellulitis   Septic arthritis (HCC)   Pressure injury of skin    Symptomatic anemia > Patient noted to have hemoglobin of 4.7 down from baseline 9.9.  In the setting of hx of large knee hematoma with significant blood drainage - 2 units of pRBCs were given in the emergency department, appropriate correction with hemoglobin remaining over 7 this morning - FOBT pending, pt denies bloody or melenotic stools - Eliquis held at time of presentation, would resume when okay with orthopedic surgery -Repeat CBC in the morning   Hemarthrosis of right knee with Cellulitis of the right shin > History of fall and knee with significant blood drainage as above followed by significant pus with erythema and warmth around the knee. -R knee markedly tender with draining effusion, purulence noted on overlying pad - increased erythema noted over R shin below, improving with antibiotics - Pt is continued on empiric vanc and cefepime -Greatly appreciate assistance by orthopedic surgery. -MRI of right knee reviewed.  Minimal residual fluid noted.  Discussed case with orthopedic surgery who recommends  consulting interventional radiology for possible drain placement.     CHF > Mildly elevated BNP at 214, having some symptoms of shortness of breath and chest tightness however this is likely related to her symptomatic anemia. > Remains euvolemic this morning   Atrial fibrillation - Continue home Tikosyn - eliquis currently on hold  Diabetes - SSI   Hypothyroidism -Most recent TSH of 5.705 from 06/04/20 -We will check TSH in a morning - Continue home Synthroid  PTSD - Continue home BuSpar and duloxetine  Hyperlipidemia - Continue home statin  GERD - Continue home PPI   DVT prophylaxis: SCD's Code Status: Full Family Communication: Pt in room, family currently not at bedside  Status is: Inpatient   Inpatient level of care appropriate due to severity of illness  Dispo: The patient is from: Home              Anticipated d/c is to: Home              Patient currently is not medically stable to d/c.   Difficult to place patient No   Consultants:  Orthopedic Surgery  Procedures:    Antimicrobials: Anti-infectives (From admission, onward)    Start     Dose/Rate Route Frequency Ordered Stop   04/23/21 0800  vancomycin (VANCOCIN) IVPB 1000 mg/200 mL premix        1,000 mg 200 mL/hr over 60 Minutes Intravenous Every 24 hours 04/22/21 1213     04/21/21 2200  vancomycin (VANCOREADY) IVPB 1250 mg/250 mL  Status:  Discontinued        1,250 mg 166.7 mL/hr over 90 Minutes Intravenous Every 24 hours 04/20/21 2018 04/22/21 1213  04/21/21 1000  ceFEPIme (MAXIPIME) 2 g in sodium chloride 0.9 % 100 mL IVPB        2 g 200 mL/hr over 30 Minutes Intravenous Every 12 hours 04/20/21 2018     04/20/21 1930  ceFEPIme (MAXIPIME) 2 g in sodium chloride 0.9 % 100 mL IVPB  Status:  Discontinued        2 g 200 mL/hr over 30 Minutes Intravenous Every 24 hours 04/20/21 1919 04/20/21 2018   04/20/21 1930  vancomycin (VANCOCIN) 2,500 mg in sodium chloride 0.9 % 500 mL IVPB        2,500  mg 250 mL/hr over 120 Minutes Intravenous  Once 04/20/21 1919 04/20/21 2325       Subjective: Pt seen after Orthopedic Surgery had seen pt. Pt is visibly anxious and upset, only fixated on the potential risk of losing limb  Objective: Vitals:   04/22/21 0322 04/22/21 0329 04/22/21 0845 04/22/21 1245  BP:  (!) 118/54 (!) 129/59 130/64  Pulse:  70 78 67  Resp:  '20 16 16  '$ Temp:  98.5 F (36.9 C)  98 F (36.7 C)  TempSrc:  Oral  Oral  SpO2:  94%  100%  Weight: 135.1 kg     Height:        Intake/Output Summary (Last 24 hours) at 04/22/2021 1346 Last data filed at 04/22/2021 1315 Gross per 24 hour  Intake 1000 ml  Output 3850 ml  Net -2850 ml    Filed Weights   04/22/21 0322  Weight: 135.1 kg    Examination: General exam: Conversant, in no acute distress Respiratory system: normal chest rise, clear, no audible wheezing Cardiovascular system: regular rhythm, s1-s2 Gastrointestinal system: Nondistended, nontender, pos BS Central nervous system: No seizures, no tremors Extremities: R knee with dressings in place, RLE with swelling and erytyema improved Skin: No rashes, no pallor Psychiatry: visibly anxious // no auditory hallucinations   Data Reviewed: I have personally reviewed following labs and imaging studies  CBC: Recent Labs  Lab 04/20/21 1631 04/21/21 0505 04/21/21 1603 04/22/21 0308  WBC 8.5 7.1 7.5 9.0  NEUTROABS 7.0  --   --   --   HGB 5.7* 6.6* 7.4* 7.1*  HCT 22.5* 23.1* 25.4* 24.5*  MCV 82.4 82.8 81.7 82.2  PLT 357 305 338 [id number]    Basic Metabolic Panel: Recent Labs  Lab 04/20/21 1631 04/21/21 0600 04/22/21 0308  NA 139 138 139  K 4.6 4.4 3.6  CL 107 106 103  CO2 '23 24 26  '$ GLUCOSE 113* 176* 106*  BUN 22 21 25*  CREATININE 1.26* 1.27* 1.47*  CALCIUM 9.1 8.5* 8.5*  MG  --  2.2  --     GFR: Estimated Creatinine Clearance: 48.2 mL/min (A) (by C-G formula based on SCr of 1.47 mg/dL (H)). Liver Function Tests: Recent Labs  Lab  04/22/21 0308  AST 10*  ALT 12  ALKPHOS 81  BILITOT 1.3*  PROT 7.2  ALBUMIN 2.5*   No results for input(s): LIPASE, AMYLASE in the last 168 hours. No results for input(s): AMMONIA in the last 168 hours. Coagulation Profile: Recent Labs  Lab 04/20/21 1848  INR 1.3*    Cardiac Enzymes: No results for input(s): CKTOTAL, CKMB, CKMBINDEX, TROPONINI in the last 168 hours. BNP (last 3 results) No results for input(s): PROBNP in the last 8760 hours. HbA1C: No results for input(s): HGBA1C in the last 72 hours. CBG: Recent Labs  Lab 04/21/21 1631 04/21/21 2122 04/22/21 [id number] 04/22/21 [id number]  04/22/21 1243  GLUCAP 122* 127* 113* 161* 172*    Lipid Profile: No results for input(s): CHOL, HDL, LDLCALC, TRIG, CHOLHDL, LDLDIRECT in the last 72 hours. Thyroid Function Tests: No results for input(s): TSH, T4TOTAL, FREET4, T3FREE, THYROIDAB in the last 72 hours. Anemia Panel: No results for input(s): VITAMINB12, FOLATE, FERRITIN, TIBC, IRON, RETICCTPCT in the last 72 hours. Sepsis Labs: Recent Labs  Lab 04/20/21 1848 04/20/21 2005  LATICACIDVEN 1.9 1.4     Recent Results (from the past 240 hour(s))  Resp Panel by RT-PCR (Flu A&B, Covid)     Status: None   Collection Time: 04/20/21  6:48 PM   Specimen: Nasopharyngeal(NP) swabs in vial transport medium  Result Value Ref Range Status   SARS Coronavirus 2 by RT PCR NEGATIVE NEGATIVE Final    Comment: (NOTE) SARS-CoV-2 target nucleic acids are NOT DETECTED.  The SARS-CoV-2 RNA is generally detectable in upper respiratory specimens during the acute phase of infection. The lowest concentration of SARS-CoV-2 viral copies this assay can detect is 138 copies/mL. A negative result does not preclude SARS-Cov-2 infection and should not be used as the sole basis for treatment or other patient management decisions. A negative result may occur with  improper specimen collection/handling, submission of specimen other than nasopharyngeal  swab, presence of viral mutation(s) within the areas targeted by this assay, and inadequate number of viral copies(<138 copies/mL). A negative result must be combined with clinical observations, patient history, and epidemiological information. The expected result is Negative.  Fact Sheet for Patients:  EntrepreneurPulse.com.au  Fact Sheet for Healthcare Providers:  IncredibleEmployment.be  This test is no t yet approved or cleared by the [place] FDA and  has been authorized for detection and/or diagnosis of SARS-CoV-2 by FDA under an Emergency Use Authorization (EUA). This EUA will remain  in effect (meaning this test can be used) for the duration of the COVID-19 declaration under Section 564(b)(1) of the Act, 21 U.S.C.section 360bbb-3(b)(1), unless the authorization is terminated  or revoked sooner.       Influenza A by PCR NEGATIVE NEGATIVE Final   Influenza B by PCR NEGATIVE NEGATIVE Final    Comment: (NOTE) The Xpert Xpress SARS-CoV-2/FLU/RSV plus assay is intended as an aid in the diagnosis of influenza from Nasopharyngeal swab specimens and should not be used as a sole basis for treatment. Nasal washings and aspirates are unacceptable for Xpert Xpress SARS-CoV-2/FLU/RSV testing.  Fact Sheet for Patients: EntrepreneurPulse.com.au  Fact Sheet for Healthcare Providers: IncredibleEmployment.be  This test is not yet approved or cleared by the [place] FDA and has been authorized for detection and/or diagnosis of SARS-CoV-2 by FDA under an Emergency Use Authorization (EUA). This EUA will remain in effect (meaning this test can be used) for the duration of the COVID-19 declaration under Section 564(b)(1) of the Act, 21 U.S.C. section 360bbb-3(b)(1), unless the authorization is terminated or revoked.  Performed at [facility], [place] [address], [place], [place] [address]   Culture,  blood (routine x 2)     Status: None (Preliminary result)   Collection Time: 04/20/21  6:48 PM   Specimen: BLOOD  Result Value Ref Range Status   Specimen Description BLOOD LEFT ANTECUBITAL  Final   Special Requests   Final    BOTTLES DRAWN AEROBIC AND ANAEROBIC Blood Culture adequate volume   Culture   Final    NO GROWTH 2 DAYS Performed at [facility], [place] [address], [place], [place] [address]  Report Status PENDING  Incomplete  Culture, blood (routine x 2)     Status: None (Preliminary result)   Collection Time: 04/20/21  6:52 PM   Specimen: BLOOD LEFT WRIST  Result Value Ref Range Status   Specimen Description BLOOD LEFT WRIST  Final   Special Requests   Final    BOTTLES DRAWN AEROBIC AND ANAEROBIC Blood Culture results may not be optimal due to an inadequate volume of blood received in culture bottles   Culture   Final    NO GROWTH 2 DAYS Performed at [facility], [place] [address] [place], [place] [address]    Report Status PENDING  Incomplete      Radiology Studies: DG Chest 2 View  Result Date: 04/20/2021 CLINICAL DATA:  Shortness of breath EXAM: CHEST - 2 VIEW COMPARISON:  11/26/2019 FINDINGS: Bilateral mild interstitial thickening. No focal consolidation. No pleural effusion or pneumothorax. Stable cardiomegaly. No acute osseous abnormality. IMPRESSION: Cardiomegaly with pulmonary vascular congestion. Electronically Signed   By: Kathreen Devoid M.D.   On: 04/20/2021 17:53   DG Lumbar Spine Complete  Result Date: 04/20/2021 CLINICAL DATA:  Pain post fall. EXAM: LUMBAR SPINE - COMPLETE 4+ VIEW COMPARISON:  None. FINDINGS: There is no evidence of lumbar spine fracture. Alignment is normal. Multilevel spondylosis and posterior facet arthropathy. IMPRESSION: 1. No acute fracture or dislocation identified about the lumbosacral spine. 2. Multilevel spondylosis and posterior facet arthropathy. Electronically Signed   By: Fidela Salisbury M.D.   On: 04/20/2021 17:47    MR KNEE RIGHT WO CONTRAST  Result Date: 04/22/2021 CLINICAL DATA:  Intermittent knee pain since falling 2 months ago. Recent development of erythema and drainage from the over the last week. Clinical concern for septic arthritis. EXAM: MRI OF THE RIGHT KNEE WITHOUT CONTRAST TECHNIQUE: Multiplanar, multisequence MR imaging of the knee was performed. No intravenous contrast was administered. COMPARISON:  Radiographs 04/20/2021 FINDINGS: MENISCI Medial meniscus: Diffuse degenerative tearing of the posterior horn. The medial meniscus is partially extruded peripherally from the joint. No definite centrally displaced meniscal fragment. Lateral meniscus: Mild degeneration of the anterior horn without evidence of tear or displaced meniscal fragment. LIGAMENTS Cruciates:  Intact. Collaterals: Intact. There is mild medial bowing of the MCL related to the medial meniscal extrusion. CARTILAGE Patellofemoral: Mild patellofemoral chondral thinning, surface irregularity and osteophyte formation. Medial: Diffuse chondral thinning with prominent peripheral and central osteophytes. Lateral: Mild chondral thinning with prominent peripheral osteophytes. MISCELLANEOUS Joint:  Small nonspecific joint effusion. Popliteal Fossa: The popliteus muscle and tendon are intact. No significant Baker's cyst. Extensor Mechanism:  Intact. Bones: No acute osseous findings. No cortical destruction or suspicious subchondral edema to suggest osteomyelitis/septic joint. Other: There is a complex subcutaneous fluid collection anterolateral to the proximal tibia, measuring approximately 8.1 cm length and 5.7 x 2.3 cm transverse. This lies superficial to the lateral patellar retinaculum and iliotibial band and demonstrates heterogeneous T1 and T2 hyperintensity. There is ill-defined T2 hyperintensity extending from the medial aspect of this collection into the pretibial subcutaneous tissues, the presumed site of the drainage. There is generalized  subcutaneous edema around the knee without additional focal fluid collection. IMPRESSION: 1. Complex subcutaneous fluid collection anterolaterally with features suggesting a subacute hematoma. Given the drainage and clinical symptoms, this may be secondarily infected. 2. No specific signs of septic arthritis or osteomyelitis. There is a small nonspecific knee joint effusion. 3. Moderate tricompartmental osteoarthritis, most advanced in the medial compartment. 4. Diffuse degenerative tearing of the posterior horn of the  medial meniscus. Electronically Signed   By: Richardean Sale M.D.   On: 04/22/2021 07:46   DG Knee Complete 4 Views Right  Result Date: 04/20/2021 CLINICAL DATA:  Pain post fall. EXAM: RIGHT KNEE - COMPLETE 4+ VIEW COMPARISON:  None. FINDINGS: No evidence of fracture, dislocation, or joint effusion. Moderate to severe osteoarthritic changes, worse in the medial compartment. Soft tissues are unremarkable. IMPRESSION: Moderate to severe osteoarthritic changes, worse in the medial compartment. No evidence fracture or dislocation. Electronically Signed   By: Fidela Salisbury M.D.   On: 04/20/2021 17:49    Scheduled Meds:  busPIRone  15 mg Oral TID   dofetilide  500 mcg Oral BID   DULoxetine  90 mg Oral Daily   insulin aspart  0-15 Units Subcutaneous TID WC   levothyroxine  100 mcg Oral Q0600   montelukast  10 mg Oral QHS   pantoprazole  40 mg Oral Daily   potassium chloride       ramelteon  8 mg Oral QHS   rosuvastatin  5 mg Oral Daily   sodium chloride flush  3 mL Intravenous Q12H   spironolactone  12.5 mg Oral Daily   torsemide  60 mg Oral BID   Continuous Infusions:  ceFEPime (MAXIPIME) IV 2 g (04/22/21 1038)   [START ON 04/23/2021] vancomycin       LOS: 1 day   Marylu Lund, MD [facility] Pager On Amion  If 7PM-7AM, please contact night-coverage 04/22/2021, 1:46 PM

## 2021-04-22 NOTE — Plan of Care (Signed)
Patient progressing 

## 2021-04-22 NOTE — Evaluation (Signed)
Physical Therapy Evaluation Patient Details Name: Hannah [name] MRN: [id number] DOB: 1945/11/09 Today's Date: 04/22/2021  History of Present Illness  75 yo female with onset of generalized weakness and symptomatic anemia was admitted 10/8 and dx with septic R knee cellulitis.  Previously had fallen, with R knee having developed effusion.  Pt has been transfused, having hgb of 4.7.  PMHx:  CHF, a-fib, celluliitis, venous insufficiency, DM, hypothyroidism, PTSD, skin injury from pressure,  Clinical Impression  Pt was seen for progression of mobility on RW to get to chair with CNA and PT.  Has purwick and is not talking about going to rehab, but rather to go home.  Her plan is to get rehab placement due to her safety and low endurance, need to recover independence in her control of balance and use of LE's.   Pt currently is recommended to SNF for these needs, and is up in chair at end of session to increase strength and aid in recovery of standing skills.  Nursing in to assist the transfer so that they will have ability to help her back to bed.       Recommendations for follow up therapy are one component of a multi-disciplinary discharge planning process, led by the attending physician.  Recommendations may be updated based on patient status, additional functional criteria and insurance authorization.  Follow Up Recommendations SNF    Equipment Recommendations  None recommended by PT    Recommendations for Other Services       Precautions / Restrictions Precautions Precautions: Fall Precaution Comments: pt has anxiety and weakness Restrictions Weight Bearing Restrictions: No      Mobility  Bed Mobility Overal bed mobility: Needs Assistance Bed Mobility: Supine to Sit     Supine to sit: Mod assist     General bed mobility comments: mod assist to support trunk and help scoot out to EOB    Transfers Overall transfer level: Needs assistance Equipment used: Rolling walker  (2 wheeled);2 person hand held assist Transfers: Sit to/from Stand Sit to Stand: Mod assist;+2 physical assistance;+2 safety/equipment;From elevated surface         General transfer comment: mod to support power up and then sidesteps to chair  Ambulation/Gait             General Gait Details: steps were a part of transfer  Stairs            Wheelchair Mobility    Modified Rankin (Stroke Patients Only)       Balance Overall balance assessment: Needs assistance;History of Falls Sitting-balance support: Feet supported Sitting balance-Leahy Scale: Fair     Standing balance support: Bilateral upper extremity supported;During functional activity Standing balance-Leahy Scale: Poor Standing balance comment: pt able to assist her effort to sit in chair with control of LE's to descend                             Pertinent Vitals/Pain Pain Assessment: Faces Faces Pain Scale: Hurts little more Pain Location: R knee    Home Living Family/patient expects to be discharged to:: Private residence Living Arrangements: Children Available Help at Discharge: Family;Available 24 hours/day Type of Home: House Home Access: Stairs to enter Entrance Stairs-Rails: None Entrance Stairs-Number of Steps: 1 Home Layout: One level Home Equipment: Walker - 2 wheels;Cane - single point Additional Comments: pt was difficult to keep on track for information, but did use SPC alone    Prior Function Level  of Independence: Independent with assistive device(s)         Comments: did have a fall history     Hand Dominance   Dominant Hand: Right    Extremity/Trunk Assessment   Upper Extremity Assessment Upper Extremity Assessment: Overall WFL for tasks assessed    Lower Extremity Assessment Lower Extremity Assessment: Generalized weakness;RLE deficits/detail RLE Deficits / Details: R knee in semibandaged state with edema and visible wound RLE Coordination: decreased  gross motor    Cervical / Trunk Assessment Cervical / Trunk Assessment: Kyphotic  Communication   Communication: No difficulties  Cognition Arousal/Alertness: Awake/alert Behavior During Therapy: Anxious;Agitated Overall Cognitive Status: Difficult to assess                                 General Comments: daughter was on phone and did correct some information      General Comments General comments (skin integrity, edema, etc.): Pt was seen for progressing her mobility on RW, assisted to chair where pt began to ask how long to be up.  Pt was actually supportive of the idea of being up in chair.    Exercises     Assessment/Plan    PT Assessment Patient needs continued PT services  PT Problem List Decreased strength;Decreased range of motion;Decreased activity tolerance;Decreased balance;Decreased mobility;Decreased coordination;Decreased cognition;Decreased knowledge of use of DME;Decreased safety awareness;Decreased knowledge of precautions;Cardiopulmonary status limiting activity;Decreased skin integrity;Pain;Obesity       PT Treatment Interventions DME instruction;Gait training;Stair training;Functional mobility training;Therapeutic activities;Therapeutic exercise;Balance training;Neuromuscular re-education;Patient/family education    PT Goals (Current goals can be found in the Care Plan section)  Acute Rehab PT Goals Patient Stated Goal: to get home and feel stronger PT Goal Formulation: With patient/family Time For Goal Achievement: 05/06/21 Potential to Achieve Goals: Good    Frequency Min 3X/week   Barriers to discharge Decreased caregiver support home in accessible home but one step to enter    Co-evaluation               AM-PAC PT "6 Clicks" Mobility  Outcome Measure Help needed turning from your back to your side while in a flat bed without using bedrails?: A Little Help needed moving from lying on your back to sitting on the side of a  flat bed without using bedrails?: A Lot Help needed moving to and from a bed to a chair (including a wheelchair)?: A Lot Help needed standing up from a chair using your arms (e.g., wheelchair or bedside chair)?: A Lot Help needed to walk in hospital room?: A Lot Help needed climbing 3-5 steps with a railing? : Total 6 Click Score: 12    End of Session Equipment Utilized During Treatment: Gait belt Activity Tolerance: Patient tolerated treatment well;Patient limited by fatigue;Treatment limited secondary to medical complications (Comment) Patient left: in chair;with call bell/phone within reach;with chair alarm set Nurse Communication: Mobility status PT Visit Diagnosis: Unsteadiness on feet (R26.81);Muscle weakness (generalized) (M62.81);Pain Pain - Right/Left: Right Pain - part of body: Knee    Time: [id number] PT Time Calculation (min) (ACUTE ONLY): 30 min   Charges:   PT Evaluation $PT Eval Moderate Complexity: 1 Mod PT Treatments $Therapeutic Activity: 8-22 mins       Ramond Dial 04/22/2021, 3:51 PM  Mee Hives, PT PhD Acute Rehab Dept. Number: [place]

## 2021-04-22 NOTE — Progress Notes (Signed)
Subjective:  Patient lying comfortably in bed this morning.  Endorsing some new pain in the back and left leg.  Stocking on the left leg and ICD which she reports were tied to have been removed.  In regards to her right knee if symptoms have been unchanged she denies any worsening pain in the right knee. She has a dressing with some drainage on it.   Objective:   VITALS:   Vitals:   04/22/21 0032 04/22/21 0322 04/22/21 0329 04/22/21 0845  BP: (!) 107/53  (!) 118/54 (!) 129/59  Pulse: 65  70 78  Resp: '20  20 16  ' Temp: 98 F (36.7 C)  98.5 F (36.9 C)   TempSrc: Oral  Oral   SpO2: 98%  94%   Weight:  135.1 kg    Height:        RLE: Area over the anterior knee with minimal erythema, small draining wound over the anterior lateral aspect of the knee with mild purulent drainage.  Minimal discomfort with knee range of motion.  Lower leg with chronic lymphedema.  No new open wounds.   Lab Results  Component Value Date   WBC 9.0 04/22/2021   HGB 7.1 (L) 04/22/2021   HCT 24.5 (L) 04/22/2021   MCV 82.2 04/22/2021   PLT 305 04/22/2021   BMET    Component Value Date/Time   NA 139 04/22/2021 0308   NA 139 10/31/2020 1452   K 3.6 04/22/2021 0308   CL 103 04/22/2021 0308   CO2 26 04/22/2021 0308   GLUCOSE 106 (H) 04/22/2021 0308   BUN 25 (H) 04/22/2021 0308   BUN 42 (H) 10/31/2020 1452   CREATININE 1.47 (H) 04/22/2021 0308   CALCIUM 8.5 (L) 04/22/2021 0308   EGFR 34 (L) 10/31/2020 1452   GFRNONAA 37 (L) 04/22/2021 0308   MRI of the right knee was reviewed, no intra-articular fluid collections concerning for arthritis. Moderate sized fluid collection noted at the anterior lateral aspect of the knee.  Assessment/Plan:    Right leg fluid collection over the anterior aspect of the knee concerning for infected hematoma.  Currently patient is afebrile, normal white count, and draining wound likely communicating with the fluid collection.  Principal Problem:   Symptomatic  anemia Active Problems:   CHF (congestive heart failure) (HCC)   PTSD (post-traumatic stress disorder)   PAF (paroxysmal atrial fibrillation) (HCC)   Hypothyroidism   DMII (diabetes mellitus, type 2) (HCC)   Cellulitis   Septic arthritis (HCC)   Pressure injury of skin  I had a long discussion today with the patient in person and the daughter over the phone regarding the imaging findings and further treatment options.  Discussed that given that she is afebrile with a normal white count and a wound that likely already communicates with the hematoma which is being actively decompressed it would be reasonable to try nonoperative treatment and continue to monitor the leg for continued improvement with wound care, dressing changes, and antibiotics.  The concern would be that the fluid collection at the infected becomes walled off again creating an abscess in which case she would likely have increasing pain, fevers, and elevated white count.  In this case surgery would be recommended.  Alternatively we can proceed with surgical debridement now and likely placement of a drain but given her severe lower extremity lymphedema she would be a high complication for wound healing problems.  Discussed that if she is unable to heal and incision made  over her leg would lead to a large open wound that could ultimately lead to an amputation.  Additionally surgical debridement we would require general anesthesia which has its own risks and complications and given her size it is difficult to extubate could end up in the ICU postoperatively.  Finally a third option would be to consult interventional radiology to see if the fluid collection would be amenable to IR drainage and drain placement.  The patient will discuss with her daughter these options and will let me know how to proceed.  She is tentatively posted for the OR for tomorrow and made n.p.o. at midnight.   Hannah [name] A Hannah [name] 04/22/2021, 12:23 PM   Hannah Constable, MD Cell [phone]

## 2021-04-22 NOTE — Progress Notes (Signed)
Pharmacy Antibiotic Note  Hannah [name] is a 75 y.o. female admitted on 04/20/2021 with  wound infection .  Pharmacy consulted on 10/8 for cefepime and vancomycin dosing.  Patient with a history of T2DM, HTN, pAF (on eliquis), HFpEF, hypothyroidism. Patient presenting with swelling, redness, purulent drainage to her right anterior knee since a fall on 8/31.  SCr 1.26 - near  baseline, SCr up to 1.47 today.  UOP 3500 ml/24 hr (10/9) s/p  IV lasix  '60mg'$  given 10/8 PM. Continues on Torsemide 60 mg po BID.   LA 1.9; WBC 8.5>>7.1> 9.0 ,afeb   Goal AUC 400-550   Plan: Adjust Vancomycin dose to 1000 mg IV q24h  (eAUC 470, SCr used 1.47, Vd used 0.5l/kg d/t BMI 46) Continue Cefepime 2 g q12h Trend WBC, fever, renal function, clinical course F/u cultures De-escalate when able,  Vancomycin levels at steady state   Height: '5\' 7"'$  (170.2 cm) Weight: 135.1 kg (297 lb 13.5 oz) IBW/kg (Calculated) : 61.6  Temp (24hrs), Avg:98.3 F (36.8 C), Min:98 F (36.7 C), Max:98.8 F (37.1 C)  Recent Labs  Lab 04/20/21 1631 04/20/21 1848 04/20/21 2005 04/21/21 0505 04/21/21 0600 04/21/21 1603 04/22/21 0308  WBC 8.5  --   --  7.1  --  7.5 9.0  CREATININE 1.26*  --   --   --  1.27*  --  1.47*  LATICACIDVEN  --  1.9 1.4  --   --   --   --      Estimated Creatinine Clearance: 48.2 mL/min (A) (by C-G formula based on SCr of 1.47 mg/dL (H)).    Allergies  Allergen Reactions   Azithromycin Rash and Other (See Comments)    Severe rash   Antimicrobials this admission: cefepime 10/8 >>  vancomycin 10/8 >>    Levels, dose adjustments:  10/10: Scr up : Adjust Vanc to 1000 mg IV q24h  (eAUC 470,SCr used 1.47, Vd used 0.5 L/kg d/t BMI 46)  Microbiology results: 10/8 BCx:  no growth to date  10/8 covid /flu pcr: neg   Thank you for allowing pharmacy to be a part of this patient's care.  Nicole Cella, RPh Clinical Pharmacist [phone] 04/22/2021 12:13 PM Please check AMION for all [place] phone numbers After 10:00 PM, call [place] [phone]

## 2021-04-23 ENCOUNTER — Inpatient Hospital Stay (HOSPITAL_COMMUNITY): Payer: PPO

## 2021-04-23 ENCOUNTER — Encounter (HOSPITAL_COMMUNITY)
Admission: EM | Disposition: A | Discharge status: Home Health | Payer: Self-pay | Source: Home / Self Care | Attending: Internal Medicine

## 2021-04-23 DIAGNOSIS — L0390 Cellulitis, unspecified: Secondary | ICD-10-CM | POA: Diagnosis not present

## 2021-04-23 DIAGNOSIS — D649 Anemia, unspecified: Secondary | ICD-10-CM | POA: Diagnosis not present

## 2021-04-23 LAB — CBC
HCT: 25.8 % — ABNORMAL LOW (ref 36.0–46.0)
Hemoglobin: 7.2 g/dL — ABNORMAL LOW (ref 12.0–15.0)
MCH: 22.7 pg — ABNORMAL LOW (ref 26.0–34.0)
MCHC: 27.9 g/dL — ABNORMAL LOW (ref 30.0–36.0)
MCV: 81.4 fL (ref 80.0–100.0)
Platelets: 343 10*3/uL (ref 150–400)
RBC: 3.17 MIL/uL — ABNORMAL LOW (ref 3.87–5.11)
RDW: 19.9 % — ABNORMAL HIGH (ref 11.5–15.5)
WBC: 10.2 10*3/uL (ref 4.0–10.5)
nRBC: 0 % (ref 0.0–0.2)

## 2021-04-23 LAB — COMPREHENSIVE METABOLIC PANEL
ALT: 10 U/L (ref 0–44)
AST: 12 U/L — ABNORMAL LOW (ref 15–41)
Albumin: 2.5 g/dL — ABNORMAL LOW (ref 3.5–5.0)
Alkaline Phosphatase: 79 U/L (ref 38–126)
Anion gap: 10 (ref 5–15)
BUN: 25 mg/dL — ABNORMAL HIGH (ref 8–23)
CO2: 28 mmol/L (ref 22–32)
Calcium: 8.6 mg/dL — ABNORMAL LOW (ref 8.9–10.3)
Chloride: 101 mmol/L (ref 98–111)
Creatinine, Ser: 1.42 mg/dL — ABNORMAL HIGH (ref 0.44–1.00)
GFR, Estimated: 39 mL/min — ABNORMAL LOW (ref >60)
Glucose, Bld: 136 mg/dL — ABNORMAL HIGH (ref 70–99)
Potassium: 3.2 mmol/L — ABNORMAL LOW (ref 3.5–5.1)
Sodium: 139 mmol/L (ref 135–145)
Total Bilirubin: 1.3 mg/dL — ABNORMAL HIGH (ref 0.3–1.2)
Total Protein: 7.6 g/dL (ref 6.5–8.1)

## 2021-04-23 LAB — TSH: TSH: 5.239 u[IU]/mL — ABNORMAL HIGH (ref 0.350–4.500)

## 2021-04-23 LAB — GLUCOSE, CAPILLARY
Glucose-Capillary: 132 mg/dL — ABNORMAL HIGH (ref 70–99)
Glucose-Capillary: 151 mg/dL — ABNORMAL HIGH (ref 70–99)
Glucose-Capillary: 81 mg/dL (ref 70–99)
Glucose-Capillary: 113 mg/dL — ABNORMAL HIGH (ref 70–99)

## 2021-04-23 SURGERY — INCISION AND DRAINAGE, ABSCESS
Anesthesia: General | Laterality: Right

## 2021-04-23 MED ORDER — LEVOTHYROXINE SODIUM 25 MCG PO TABS
125.0000 ug | ORAL_TABLET | Freq: Every day | ORAL | Status: DC
  Administered 2021-04-24 – 2021-04-26 (×3): 125 ug via ORAL
  Filled 2021-04-23 (×3): qty 1

## 2021-04-23 MED ORDER — LIDOCAINE HCL (PF) 1 % IJ SOLN
INTRAMUSCULAR | Status: AC
  Filled 2021-04-23: qty 30

## 2021-04-23 MED ORDER — POTASSIUM CHLORIDE CRYS ER 20 MEQ PO TBCR
40.0000 meq | EXTENDED_RELEASE_TABLET | Freq: Four times a day (QID) | ORAL | Status: AC
  Administered 2021-04-23 – 2021-04-24 (×4): 40 meq via ORAL
  Filled 2021-04-23 (×4): qty 2

## 2021-04-23 NOTE — Progress Notes (Signed)
     Subjective:  Patient lying comfortably in bed this morning.  Complaining of pain primarily in the left forefoot. Denies new injury or trauma. Minimal pain in the R knee. After discussion yesterday she was not interested in surgery I&D. Discussed possiblity of IR drainage and possible drain placement into the residual fluid collection. Awaiting IR consult today.   Objective:   VITALS:   Vitals:   04/22/21 1600 04/22/21 2044 04/23/21 0053 04/23/21 0503  BP: 121/76 134/76 101/88 (!) 112/55  Pulse: 78 74 88 (!) 51  Resp: 18 (!) 22 (!) 22 (!) 24  Temp: 98.4 F (36.9 C) 98.4 F (36.9 C) 98.3 F (36.8 C) 98.5 F (36.9 C)  TempSrc: Oral Oral Oral Oral  SpO2: 100%  94% 94%  Weight:    135.1 kg  Height:        RLE: Area over the anterior knee with minimal erythema, small draining wound over the anterior lateral aspect of the knee with mild purulent drainage.  Minimal discomfort with knee range of motion.  Lower leg with chronic lymphedema.  No new open wounds.   Lab Results  Component Value Date   WBC 10.2 04/23/2021   HGB 7.2 (L) 04/23/2021   HCT 25.8 (L) 04/23/2021   MCV 81.4 04/23/2021   PLT 343 04/23/2021   BMET    Component Value Date/Time   NA 139 04/23/2021 0145   NA 139 10/31/2020 1452   K 3.2 (L) 04/23/2021 0145   CL 101 04/23/2021 0145   CO2 28 04/23/2021 0145   GLUCOSE 136 (H) 04/23/2021 0145   BUN 25 (H) 04/23/2021 0145   BUN 42 (H) 10/31/2020 1452   CREATININE 1.42 (H) 04/23/2021 0145   CALCIUM 8.6 (L) 04/23/2021 0145   EGFR 34 (L) 10/31/2020 1452   GFRNONAA 39 (L) 04/23/2021 0145   MRI of the right knee was reviewed, no intra-articular fluid collections concerning for arthritis. Moderate sized fluid collection noted at the anterior lateral aspect of the knee.  Assessment/Plan:    Right leg fluid collection over the anterior aspect of the knee concerning for infected hematoma.  Currently patient is afebrile, normal white count, and draining wound  likely communicating with the fluid collection.  Principal Problem:   Symptomatic anemia Active Problems:   CHF (congestive heart failure) (HCC)   PTSD (post-traumatic stress disorder)   PAF (paroxysmal atrial fibrillation) (HCC)   Hypothyroidism   DMII (diabetes mellitus, type 2) (HCC)   Cellulitis   Septic arthritis (HCC)   Pressure injury of skin   Left foot - possible metatarsal stress fx or metatarsalgia. No swelling, erythema or wound notes. Low suspicion for infectious process.  R knee- Anterolateral superficial drainage wound and deep hematoma, no concern for septic knee arthritis at this time. Patient would not like to proceed with surgery at this time given the high risk of wound healing complications. She would like to consider IR drainage. Awaiting IR consult to see if this would be feasible. She remains afebrile and with normal white count and knee erythema and drainage are minimal at this time so I feel surgical debridement is not urgently indicated at this time, but this could change if her condition worsens. She expressed understanding and agreement.  Arvo Ealy A Reubin Bushnell 04/23/2021, 7:43 AM   Charlies Constable, MD Cell [phone]

## 2021-04-23 NOTE — Progress Notes (Signed)
PROGRESS NOTE    Hannah [name]  [id number] DOB: 1946-01-22 DOA: 04/20/2021 PCP: Lujean Amel, MD    Brief Narrative:  75 y.o. female with medical history significant of CHF, atrial fibrillation, cellulitis, venous insufficiency, diabetes, hypothyroidism, PTSD who presents with drainage from her knee. Pt described R knee effusion following mechanical fall in 8/22 with worsening effusion afterwards. Spontaneous self-draining of R knee noted 2 weeks prior to this hospital visit described as mostly bloody, but also purulent as well  Assessment & Plan:   Principal Problem:   Symptomatic anemia Active Problems:   CHF (congestive heart failure) (HCC)   PTSD (post-traumatic stress disorder)   PAF (paroxysmal atrial fibrillation) (HCC)   Hypothyroidism   DMII (diabetes mellitus, type 2) (HCC)   Cellulitis   Septic arthritis (HCC)   Pressure injury of skin    Symptomatic anemia > Patient noted to have hemoglobin of 4.7 down from baseline 9.9.  In the setting of hx of large knee hematoma with significant blood drainage - 2 units of pRBCs were given in the emergency department, appropriate correction with hemoglobin remaining over 7. - FOBT pending, pt denies bloody or melenotic stools -Eliquis remains on hold, would resume when okay with orthopedic surgery -Recheck CBC in the morning   Hemarthrosis of right knee with Cellulitis of the right shin > History of fall and knee with significant blood drainage as above followed by significant pus with erythema and warmth around the knee. -R knee markedly tender with draining effusion, purulence noted on overlying pad - increased erythema noted over R shin below, improving with antibiotics - Pt is continued on empiric vanc and cefepime -Greatly appreciate assistance by orthopedic surgery. -MRI of right knee reviewed.  Minimal residual fluid noted.  Have since consulted interventional radiology and patient is now status post drainage of  residual purulent content, yielding 15 cc -Overlying erythema and edema has improved with antibiotics   CHF > Mildly elevated BNP at 214, having some symptoms of shortness of breath and chest tightness however this is likely related to her symptomatic anemia. > Clinically appears euvolemic   Atrial fibrillation - Continue home Tikosyn -Eliquis remains on hold as per above  Diabetes - SSI   Hypothyroidism -Most recent TSH of 5.705 from 06/04/20 -TSH of 5.239 this morning -We will increase dose of Synthroid to 125 mcg, would repeat TSH in 4 to 6 weeks  PTSD - Continue home BuSpar and duloxetine  Hyperlipidemia - Continue home statin  GERD - Continue home PPI   DVT prophylaxis: SCD's Code Status: Full Family Communication: Pt in room, family currently not at bedside  Status is: Inpatient   Inpatient level of care appropriate due to severity of illness  Dispo: The patient is from: Home              Anticipated d/c is to: Home              Patient currently is not medically stable to d/c.   Difficult to place patient No   Consultants:  Orthopedic Surgery IR  Procedures:    Antimicrobials: Anti-infectives (From admission, onward)    Start     Dose/Rate Route Frequency Ordered Stop   04/23/21 0800  vancomycin (VANCOCIN) IVPB 1000 mg/200 mL premix        1,000 mg 200 mL/hr over 60 Minutes Intravenous Every 24 hours 04/22/21 1213     04/21/21 2200  vancomycin (VANCOREADY) IVPB 1250 mg/250 mL  Status:  Discontinued  1,250 mg 166.7 mL/hr over 90 Minutes Intravenous Every 24 hours 04/20/21 2018 04/22/21 1213   04/21/21 1000  ceFEPIme (MAXIPIME) 2 g in sodium chloride 0.9 % 100 mL IVPB        2 g 200 mL/hr over 30 Minutes Intravenous Every 12 hours 04/20/21 2018     04/20/21 1930  ceFEPIme (MAXIPIME) 2 g in sodium chloride 0.9 % 100 mL IVPB  Status:  Discontinued        2 g 200 mL/hr over 30 Minutes Intravenous Every 24 hours 04/20/21 1919 04/20/21 2018    04/20/21 1930  vancomycin (VANCOCIN) 2,500 mg in sodium chloride 0.9 % 500 mL IVPB        2,500 mg 250 mL/hr over 120 Minutes Intravenous  Once 04/20/21 1919 04/20/21 2325       Subjective: Complaining of continued right knee discomfort  Objective: Vitals:   04/23/21 0053 04/23/21 0503 04/23/21 1300 04/23/21 1500  BP: 101/88 (!) 112/55 113/64 136/76  Pulse: 88 (!) 51 83 81  Resp: (!) 22 (!) '24 18 18  '$ Temp: 98.3 F (36.8 C) 98.5 F (36.9 C) 98.7 F (37.1 C) 98.8 F (37.1 C)  TempSrc: Oral Oral Oral   SpO2: 94% 94% 99% 100%  Weight:  135.1 kg    Height:        Intake/Output Summary (Last 24 hours) at 04/23/2021 1604 Last data filed at 04/23/2021 1300 Gross per 24 hour  Intake 240 ml  Output 2700 ml  Net -2460 ml    Filed Weights   04/22/21 0322 04/23/21 0503  Weight: 135.1 kg 135.1 kg    Examination: General exam: Awake, laying in bed, in nad Respiratory system: Normal respiratory effort, no wheezing Cardiovascular system: regular rate, s1, s2 Gastrointestinal system: Soft, nondistended, positive BS Central nervous system: CN2-12 grossly intact, strength intact Extremities: Perfused, R knee with dressings in place, Skin: Right shin erythema and swelling has improved Psychiatry: Mood normal // no visual hallucinations   Data Reviewed: I have personally reviewed following labs and imaging studies  CBC: Recent Labs  Lab 04/20/21 1631 04/21/21 0505 04/21/21 1603 04/22/21 0308 04/23/21 0145  WBC 8.5 7.1 7.5 9.0 10.2  NEUTROABS 7.0  --   --   --   --   HGB 5.7* 6.6* 7.4* 7.1* 7.2*  HCT 22.5* 23.1* 25.4* 24.5* 25.8*  MCV 82.4 82.8 81.7 82.2 81.4  PLT 357 305 338 305 [id number]    Basic Metabolic Panel: Recent Labs  Lab 04/20/21 1631 04/21/21 0600 04/22/21 0308 04/23/21 0145  NA 139 138 139 139  K 4.6 4.4 3.6 3.2*  CL 107 106 103 101  CO2 '23 24 26 28  '$ GLUCOSE 113* 176* 106* 136*  BUN 22 21 25* 25*  CREATININE 1.26* 1.27* 1.47* 1.42*  CALCIUM 9.1 8.5*  8.5* 8.6*  MG  --  2.2  --   --     GFR: Estimated Creatinine Clearance: 49.9 mL/min (A) (by C-G formula based on SCr of 1.42 mg/dL (H)). Liver Function Tests: Recent Labs  Lab 04/22/21 0308 04/23/21 0145  AST 10* 12*  ALT 12 10  ALKPHOS 81 79  BILITOT 1.3* 1.3*  PROT 7.2 7.6  ALBUMIN 2.5* 2.5*    No results for input(s): LIPASE, AMYLASE in the last 168 hours. No results for input(s): AMMONIA in the last 168 hours. Coagulation Profile: Recent Labs  Lab 04/20/21 1848  INR 1.3*    Cardiac Enzymes: No results for input(s): CKTOTAL, CKMB, CKMBINDEX, TROPONINI  in the last 168 hours. BNP (last 3 results) No results for input(s): PROBNP in the last 8760 hours. HbA1C: No results for input(s): HGBA1C in the last 72 hours. CBG: Recent Labs  Lab 04/22/21 1243 04/22/21 1631 04/22/21 2157 04/23/21 0613 04/23/21 1100  GLUCAP 172* 126* 133* 132* 151*    Lipid Profile: No results for input(s): CHOL, HDL, LDLCALC, TRIG, CHOLHDL, LDLDIRECT in the last 72 hours. Thyroid Function Tests: Recent Labs    04/23/21 0145  TSH 5.239*   Anemia Panel: No results for input(s): VITAMINB12, FOLATE, FERRITIN, TIBC, IRON, RETICCTPCT in the last 72 hours. Sepsis Labs: Recent Labs  Lab 04/20/21 1848 04/20/21 2005  LATICACIDVEN 1.9 1.4     Recent Results (from the past 240 hour(s))  Resp Panel by RT-PCR (Flu A&B, Covid)     Status: None   Collection Time: 04/20/21  6:48 PM   Specimen: Nasopharyngeal(NP) swabs in vial transport medium  Result Value Ref Range Status   SARS Coronavirus 2 by RT PCR NEGATIVE NEGATIVE Final    Comment: (NOTE) SARS-CoV-2 target nucleic acids are NOT DETECTED.  The SARS-CoV-2 RNA is generally detectable in upper respiratory specimens during the acute phase of infection. The lowest concentration of SARS-CoV-2 viral copies this assay can detect is 138 copies/mL. A negative result does not preclude SARS-Cov-2 infection and should not be used as the sole  basis for treatment or other patient management decisions. A negative result may occur with  improper specimen collection/handling, submission of specimen other than nasopharyngeal swab, presence of viral mutation(s) within the areas targeted by this assay, and inadequate number of viral copies(<138 copies/mL). A negative result must be combined with clinical observations, patient history, and epidemiological information. The expected result is Negative.  Fact Sheet for Patients:  EntrepreneurPulse.com.au  Fact Sheet for Healthcare Providers:  IncredibleEmployment.be  This test is no t yet approved or cleared by the [place] FDA and  has been authorized for detection and/or diagnosis of SARS-CoV-2 by FDA under an Emergency Use Authorization (EUA). This EUA will remain  in effect (meaning this test can be used) for the duration of the COVID-19 declaration under Section 564(b)(1) of the Act, 21 U.S.C.section 360bbb-3(b)(1), unless the authorization is terminated  or revoked sooner.       Influenza A by PCR NEGATIVE NEGATIVE Final   Influenza B by PCR NEGATIVE NEGATIVE Final    Comment: (NOTE) The Xpert Xpress SARS-CoV-2/FLU/RSV plus assay is intended as an aid in the diagnosis of influenza from Nasopharyngeal swab specimens and should not be used as a sole basis for treatment. Nasal washings and aspirates are unacceptable for Xpert Xpress SARS-CoV-2/FLU/RSV testing.  Fact Sheet for Patients: EntrepreneurPulse.com.au  Fact Sheet for Healthcare Providers: IncredibleEmployment.be  This test is not yet approved or cleared by the [place] FDA and has been authorized for detection and/or diagnosis of SARS-CoV-2 by FDA under an Emergency Use Authorization (EUA). This EUA will remain in effect (meaning this test can be used) for the duration of the COVID-19 declaration under Section 564(b)(1) of the Act,  21 U.S.C. section 360bbb-3(b)(1), unless the authorization is terminated or revoked.  Performed at [facility], [place] [address], [place], [place] [address]   Culture, blood (routine x 2)     Status: None (Preliminary result)   Collection Time: 04/20/21  6:48 PM   Specimen: BLOOD  Result Value Ref Range Status   Specimen Description BLOOD LEFT ANTECUBITAL  Final   Special Requests   Final  BOTTLES DRAWN AEROBIC AND ANAEROBIC Blood Culture adequate volume   Culture   Final    NO GROWTH 3 DAYS Performed at [facility], [place] [address], [place], [place] [address]    Report Status PENDING  Incomplete  Culture, blood (routine x 2)     Status: None (Preliminary result)   Collection Time: 04/20/21  6:52 PM   Specimen: BLOOD LEFT WRIST  Result Value Ref Range Status   Specimen Description BLOOD LEFT WRIST  Final   Special Requests   Final    BOTTLES DRAWN AEROBIC AND ANAEROBIC Blood Culture results may not be optimal due to an inadequate volume of blood received in culture bottles   Culture   Final    NO GROWTH 3 DAYS Performed at [facility], [place] [address], [place], [place] [address]    Report Status PENDING  Incomplete  Aerobic/Anaerobic Culture w Gram Stain (surgical/deep wound)     Status: None (Preliminary result)   Collection Time: 04/23/21  3:21 PM   Specimen: Wound; Abscess  Result Value Ref Range Status   Specimen Description WOUND  Final   Special Requests   Final     KNEE ABCESS Performed at [facility], [place] [address], [place], [place] [address]    Gram Stain PENDING  Incomplete   Culture PENDING  Incomplete   Report Status PENDING  Incomplete      Radiology Studies: MR KNEE RIGHT WO CONTRAST  Result Date: 04/22/2021 CLINICAL DATA:  Intermittent knee pain since falling 2 months ago. Recent development of erythema and drainage from the over the last week. Clinical concern for septic arthritis. EXAM: MRI OF THE RIGHT KNEE WITHOUT  CONTRAST TECHNIQUE: Multiplanar, multisequence MR imaging of the knee was performed. No intravenous contrast was administered. COMPARISON:  Radiographs 04/20/2021 FINDINGS: MENISCI Medial meniscus: Diffuse degenerative tearing of the posterior horn. The medial meniscus is partially extruded peripherally from the joint. No definite centrally displaced meniscal fragment. Lateral meniscus: Mild degeneration of the anterior horn without evidence of tear or displaced meniscal fragment. LIGAMENTS Cruciates:  Intact. Collaterals: Intact. There is mild medial bowing of the MCL related to the medial meniscal extrusion. CARTILAGE Patellofemoral: Mild patellofemoral chondral thinning, surface irregularity and osteophyte formation. Medial: Diffuse chondral thinning with prominent peripheral and central osteophytes. Lateral: Mild chondral thinning with prominent peripheral osteophytes. MISCELLANEOUS Joint:  Small nonspecific joint effusion. Popliteal Fossa: The popliteus muscle and tendon are intact. No significant Baker's cyst. Extensor Mechanism:  Intact. Bones: No acute osseous findings. No cortical destruction or suspicious subchondral edema to suggest osteomyelitis/septic joint. Other: There is a complex subcutaneous fluid collection anterolateral to the proximal tibia, measuring approximately 8.1 cm length and 5.7 x 2.3 cm transverse. This lies superficial to the lateral patellar retinaculum and iliotibial band and demonstrates heterogeneous T1 and T2 hyperintensity. There is ill-defined T2 hyperintensity extending from the medial aspect of this collection into the pretibial subcutaneous tissues, the presumed site of the drainage. There is generalized subcutaneous edema around the knee without additional focal fluid collection. IMPRESSION: 1. Complex subcutaneous fluid collection anterolaterally with features suggesting a subacute hematoma. Given the drainage and clinical symptoms, this may be secondarily infected. 2. No  specific signs of septic arthritis or osteomyelitis. There is a small nonspecific knee joint effusion. 3. Moderate tricompartmental osteoarthritis, most advanced in the medial compartment. 4. Diffuse degenerative tearing of the posterior horn of the medial meniscus. Electronically Signed   By: Caryl Comes.D.  On: 04/22/2021 07:46   DG Foot 2 Views Left  Result Date: 04/23/2021 CLINICAL DATA:  Left foot pain, inability to bear weight, no injury EXAM: LEFT FOOT - 2 VIEW COMPARISON:  No prior foot radiographs, correlation is made with ankle radiographs 11/28/2019 FINDINGS: There is no evidence of fracture or dislocation. Bidirectional calcaneal enthesophytes. Degenerative changes at the tarsometatarsal junctions. Vascular calcifications. Significant soft tissue abnormality. IMPRESSION: Negative. Electronically Signed   By: Merilyn Baba M.D.   On: 04/23/2021 11:07    Scheduled Meds:  busPIRone  15 mg Oral TID   dofetilide  500 mcg Oral BID   DULoxetine  90 mg Oral Daily   insulin aspart  0-15 Units Subcutaneous TID WC   levothyroxine  100 mcg Oral Q0600   lidocaine (PF)       montelukast  10 mg Oral QHS   pantoprazole  40 mg Oral Daily   potassium chloride  40 mEq Oral Q6H   ramelteon  8 mg Oral QHS   rosuvastatin  5 mg Oral Daily   sodium chloride flush  3 mL Intravenous Q12H   spironolactone  12.5 mg Oral Daily   torsemide  60 mg Oral BID   Continuous Infusions:  ceFEPime (MAXIPIME) IV 2 g (04/23/21 1002)   vancomycin 1,000 mg (04/23/21 0850)     LOS: 2 days   Marylu Lund, MD [facility] Pager On Amion  If 7PM-7AM, please contact night-coverage 04/23/2021, 4:04 PM

## 2021-04-23 NOTE — Plan of Care (Signed)
Patient progressing 

## 2021-04-23 NOTE — Procedures (Signed)
.  Interventional Radiology Procedure Note  Procedure: US ASPIRATION RT KNEE ABSCESS    Complications: None  Estimated Blood Loss:  MIN  Findings: 15CC PUS ASPIRATED    Tamera Punt, MD

## 2021-04-24 DIAGNOSIS — D649 Anemia, unspecified: Secondary | ICD-10-CM | POA: Diagnosis not present

## 2021-04-24 LAB — COMPREHENSIVE METABOLIC PANEL
ALT: 15 U/L (ref 0–44)
AST: 16 U/L (ref 15–41)
Albumin: 2.3 g/dL — ABNORMAL LOW (ref 3.5–5.0)
Alkaline Phosphatase: 86 U/L (ref 38–126)
Anion gap: 9 (ref 5–15)
BUN: 27 mg/dL — ABNORMAL HIGH (ref 8–23)
CO2: 33 mmol/L — ABNORMAL HIGH (ref 22–32)
Calcium: 8.8 mg/dL — ABNORMAL LOW (ref 8.9–10.3)
Chloride: 99 mmol/L (ref 98–111)
Creatinine, Ser: 1.44 mg/dL — ABNORMAL HIGH (ref 0.44–1.00)
GFR, Estimated: 38 mL/min — ABNORMAL LOW (ref >60)
Glucose, Bld: 115 mg/dL — ABNORMAL HIGH (ref 70–99)
Potassium: 4.1 mmol/L (ref 3.5–5.1)
Sodium: 141 mmol/L (ref 135–145)
Total Bilirubin: 1.1 mg/dL (ref 0.3–1.2)
Total Protein: 7.5 g/dL (ref 6.5–8.1)

## 2021-04-24 LAB — CBC
HCT: 25.9 % — ABNORMAL LOW (ref 36.0–46.0)
Hemoglobin: 7.4 g/dL — ABNORMAL LOW (ref 12.0–15.0)
MCH: 23.6 pg — ABNORMAL LOW (ref 26.0–34.0)
MCHC: 28.6 g/dL — ABNORMAL LOW (ref 30.0–36.0)
MCV: 82.5 fL (ref 80.0–100.0)
Platelets: 335 10*3/uL (ref 150–400)
RBC: 3.14 MIL/uL — ABNORMAL LOW (ref 3.87–5.11)
RDW: 20.5 % — ABNORMAL HIGH (ref 11.5–15.5)
WBC: 8.4 10*3/uL (ref 4.0–10.5)
nRBC: 0 % (ref 0.0–0.2)

## 2021-04-24 LAB — GLUCOSE, CAPILLARY
Glucose-Capillary: 102 mg/dL — ABNORMAL HIGH (ref 70–99)
Glucose-Capillary: 122 mg/dL — ABNORMAL HIGH (ref 70–99)
Glucose-Capillary: 147 mg/dL — ABNORMAL HIGH (ref 70–99)
Glucose-Capillary: 109 mg/dL — ABNORMAL HIGH (ref 70–99)

## 2021-04-24 MED ORDER — HYDROCERIN EX CREA
TOPICAL_CREAM | Freq: Two times a day (BID) | CUTANEOUS | Status: DC
  Filled 2021-04-24: qty 113

## 2021-04-24 NOTE — Progress Notes (Signed)
     Subjective:  Patient lying comfortably in bed this morning.  She underwent bedside aspiration of the right anterior leg fluid collection yesterday by interventional radiology.  This was well-tolerated.  She reports no significant pain in the right knee.  She notes that the pain in her left foot has resolved..  No new issues.   Objective:   VITALS:   Vitals:   04/24/21 0436 04/24/21 0600 04/24/21 0835 04/24/21 1106  BP: (!) 109/57  103/63 117/86  Pulse: 80  85 69  Resp: _0 Temp: 98.7 F (37.1 C)  98.7 F (37.1 C) 98.7 F (37.1 C)  TempSrc: Oral  Oral Oral  SpO2: 96%  99% 99%  Weight: 133.3 kg 129.7 kg    Height:        RLE: Area over the anterior knee with minimal erythema, small draining wound over the anterior lateral aspect of the knee with mild purulent drainage.  Minimal discomfort with knee range of motion.  Lower leg with chronic lymphedema decreased from prior.  No new open wounds.   Lab Results  Component Value Date   WBC 8.4 04/24/2021   HGB 7.4 (L) 04/24/2021   HCT 25.9 (L) 04/24/2021   MCV 82.5 04/24/2021   PLT 335 04/24/2021   BMET    Component Value Date/Time   NA 141 04/24/2021 0737   NA 139 10/31/2020 1452   K 4.1 04/24/2021 0737   CL 99 04/24/2021 0737   CO2 33 (H) 04/24/2021 0737   GLUCOSE 115 (H) 04/24/2021 0737   BUN 27 (H) 04/24/2021 0737   BUN 42 (H) 10/31/2020 1452   CREATININE 1.44 (H) 04/24/2021 0737   CALCIUM 8.8 (L) 04/24/2021 0737   EGFR 34 (L) 10/31/2020 1452   GFRNONAA 38 (L) 04/24/2021 0737   MRI of the right knee was reviewed, no intra-articular fluid collections concerning for arthritis. Moderate sized fluid collection noted at the anterior lateral aspect of the knee.  Assessment/Plan:    Right leg fluid collection over the anterior aspect of the knee concerning for infected hematoma.  Currently patient is afebrile, normal white count, and draining wound likely communicating with the fluid collection.  Principal  Problem:   Symptomatic anemia Active Problems:   CHF (congestive heart failure) (HCC)   PTSD (post-traumatic stress disorder)   PAF (paroxysmal atrial fibrillation) (HCC)   Hypothyroidism   DMII (diabetes mellitus, type 2) (HCC)   Cellulitis   Septic arthritis (HCC)   Pressure injury of skin   Left foot -x-rays negative for fracture, symptoms consistent with metatarsalgia which appears improved today.  R knee- Anterolateral superficial drainage wound and deep hematoma, no concern for septic knee arthritis at this time.  Post bedside aspiration by IR on 10/11.  Doing well.  Clinically wound appears benign.  Anticipate remaining abscess to continue to drain from the anterior wound which will heal by secondary intention.  Recommended once daily dressing changes.  Antibiotics per the medicine team.  Surgical intervention dissipated at this time.  She remains afebrile with normal white count.  Hannah [name] 04/24/2021, 12:05 PM   Charlies Constable, MD Cell [phone]

## 2021-04-24 NOTE — Plan of Care (Signed)
°  Problem: Coping: °Goal: Level of anxiety will decrease °Outcome: Progressing °  °

## 2021-04-24 NOTE — Progress Notes (Signed)
This patient is up in the chair eating lunch. Nurse at [facility]. Notified nurse that patient will need to be back in bed for IV restart. Will complete consult at this time. Instructed to reconsult when patient back in bed. VU. Fran Lowes, RN VAST

## 2021-04-24 NOTE — Progress Notes (Signed)
Pt. Refusing to go to bed. States she wants to sleep in reclining chair because bed is uncomfortable. Pt. Cleaned, purewick in place, and call light within reach.

## 2021-04-24 NOTE — Progress Notes (Signed)
PROGRESS NOTE    Hannah [name]  [id number] DOB: 05-11-1946 DOA: 04/20/2021 PCP: Lujean Amel, MD    Brief Narrative:  75 y.o. female with medical history significant of CHF, atrial fibrillation, cellulitis, venous insufficiency, diabetes, hypothyroidism, PTSD who presents with drainage from her knee. Pt described R knee effusion following mechanical fall in 8/22 with worsening effusion afterwards. Spontaneous self-draining of R knee noted 2 weeks prior to this hospital visit described as mostly bloody, but also purulent as well  Assessment & Plan:   Symptomatic anemia -Hemoglobin improving status post 2 units PRBC -Large right knee hematoma; rule out infectious process as below -Likely exacerbated given Eliquis on home med rec, continues to be held -Recheck CBC in the morning   Hemarthrosis of right knee with Cellulitis of the right shin -Status post mechanical fall with trauma to the right knee -Rule out infection given preliminary wound culture showing multiple staff aureus, blood cultures remain negative -Fluid aspirate culture pending -Appreciate both orthopedic and interventional radiology assistance with this difficult case -Aspiration 04/23/2021   CHF, rule out acute exacerbation -Likely symptomatic anemia playing a more major role than true heart failure exacerbation -Patient does not appear to be hypervolemic at this time -Given resolution of symptoms with transfusion acute heart failure likely ruled out   Atrial fibrillation - Continue home Tikosyn - Eliquis remains on hold as per above  Diabetes - SSI, hypoglycemic protocol ongoing  Hypothyroidism -TSH minimally elevated at 5.239 at intake -Continue increased Synthroid dose at 125 every morning   PTSD - Continue home BuSpar and duloxetine  Hyperlipidemia - Continue home statin  GERD - Continue home PPI   DVT prophylaxis: SCD's Code Status: Full Family Communication: None present  Status is:  Inpatient   Inpatient level of care appropriate due to severity of illness  Dispo: The patient is from: Home              Anticipated d/c is to: Home              Patient currently is not medically stable to d/c.   Difficult to place patient No   Consultants:  Orthopedic Surgery IR  Procedures:  Right knee aspiration 04/23/2021  Antimicrobials: Anti-infectives (From admission, onward)    Start     Dose/Rate Route Frequency Ordered Stop   04/23/21 0800  vancomycin (VANCOCIN) IVPB 1000 mg/200 mL premix        1,000 mg 200 mL/hr over 60 Minutes Intravenous Every 24 hours 04/22/21 1213     04/21/21 2200  vancomycin (VANCOREADY) IVPB 1250 mg/250 mL  Status:  Discontinued        1,250 mg 166.7 mL/hr over 90 Minutes Intravenous Every 24 hours 04/20/21 2018 04/22/21 1213   04/21/21 1000  ceFEPIme (MAXIPIME) 2 g in sodium chloride 0.9 % 100 mL IVPB        2 g 200 mL/hr over 30 Minutes Intravenous Every 12 hours 04/20/21 2018     04/20/21 1930  ceFEPIme (MAXIPIME) 2 g in sodium chloride 0.9 % 100 mL IVPB  Status:  Discontinued        2 g 200 mL/hr over 30 Minutes Intravenous Every 24 hours 04/20/21 1919 04/20/21 2018   04/20/21 1930  vancomycin (VANCOCIN) 2,500 mg in sodium chloride 0.9 % 500 mL IVPB        2,500 mg 250 mL/hr over 120 Minutes Intravenous  Once 04/20/21 1919 04/20/21 2325       Subjective: Patient complaining of left ankle  pain difficulty walking but ambulating with PT to bedside chair today, patient somewhat irritated that she has not been spoken to or updated on her timeline for her current situation which we discussed was somewhat complicated given right knee swelling, aspiration, questionable infection and questionable need for further procedure with either orthopedics or interventional radiology.  She later in the day became irate and yelled at staff "no doctor has seen me all day" despite both myself and orthopedics seeing the patient prior to 10 AM with a very  lengthy discussion as outlined above.  Objective: Vitals:   04/23/21 1500 04/23/21 2223 04/24/21 0436 04/24/21 0600  BP: 136/76 100/71 (!) 109/57   Pulse: 81 85 80   Resp: '18 20 20   '$ Temp: 98.8 F (37.1 C) 98.5 F (36.9 C) 98.7 F (37.1 C)   TempSrc:  Oral Oral   SpO2: 100% 99% 96%   Weight:   133.3 kg 129.7 kg  Height:        Intake/Output Summary (Last 24 hours) at 04/24/2021 0732 Last data filed at 04/23/2021 1700 Gross per 24 hour  Intake 480 ml  Output 1400 ml  Net -920 ml    Filed Weights   04/23/21 0503 04/24/21 0436 04/24/21 0600  Weight: 135.1 kg 133.3 kg 129.7 kg    Examination: General exam: Awake, laying in bed, in nad Respiratory system: Normal respiratory effort, no wheezing Cardiovascular system: regular rate, s1, s2 Gastrointestinal system: Soft, nondistended, positive BS Central nervous system: CN2-12 grossly intact, strength intact Skin: Right shin swelling improving, right knee bandage clean dry intact; chronic venous stasis changes noted bilateral lower extremities, nonblanching, erythema appears to be resolving Psychiatry: Mood normal // no visual hallucinations   Data Reviewed: I have personally reviewed following labs and imaging studies  CBC: Recent Labs  Lab 04/20/21 1631 04/21/21 0505 04/21/21 1603 04/22/21 0308 04/23/21 0145  WBC 8.5 7.1 7.5 9.0 10.2  NEUTROABS 7.0  --   --   --   --   HGB 5.7* 6.6* 7.4* 7.1* 7.2*  HCT 22.5* 23.1* 25.4* 24.5* 25.8*  MCV 82.4 82.8 81.7 82.2 81.4  PLT 357 305 338 305 [id number]    Basic Metabolic Panel: Recent Labs  Lab 04/20/21 1631 04/21/21 0600 04/22/21 0308 04/23/21 0145  NA 139 138 139 139  K 4.6 4.4 3.6 3.2*  CL 107 106 103 101  CO2 '23 24 26 28  '$ GLUCOSE 113* 176* 106* 136*  BUN 22 21 25* 25*  CREATININE 1.26* 1.27* 1.47* 1.42*  CALCIUM 9.1 8.5* 8.5* 8.6*  MG  --  2.2  --   --     GFR: Estimated Creatinine Clearance: 48.7 mL/min (A) (by C-G formula based on SCr of 1.42 mg/dL  (H)). Liver Function Tests: Recent Labs  Lab 04/22/21 0308 04/23/21 0145  AST 10* 12*  ALT 12 10  ALKPHOS 81 79  BILITOT 1.3* 1.3*  PROT 7.2 7.6  ALBUMIN 2.5* 2.5*    No results for input(s): LIPASE, AMYLASE in the last 168 hours. No results for input(s): AMMONIA in the last 168 hours. Coagulation Profile: Recent Labs  Lab 04/20/21 1848  INR 1.3*    Cardiac Enzymes: No results for input(s): CKTOTAL, CKMB, CKMBINDEX, TROPONINI in the last 168 hours. BNP (last 3 results) No results for input(s): PROBNP in the last 8760 hours. HbA1C: No results for input(s): HGBA1C in the last 72 hours. CBG: Recent Labs  Lab 04/23/21 0613 04/23/21 1100 04/23/21 1618 04/23/21 2149 04/24/21 0700  GLUCAP 132* 151* 81 113* 102*    Lipid Profile: No results for input(s): CHOL, HDL, LDLCALC, TRIG, CHOLHDL, LDLDIRECT in the last 72 hours. Thyroid Function Tests: Recent Labs    04/23/21 0145  TSH 5.239*    Anemia Panel: No results for input(s): VITAMINB12, FOLATE, FERRITIN, TIBC, IRON, RETICCTPCT in the last 72 hours. Sepsis Labs: Recent Labs  Lab 04/20/21 1848 04/20/21 2005  LATICACIDVEN 1.9 1.4     Recent Results (from the past 240 hour(s))  Resp Panel by RT-PCR (Flu A&B, Covid)     Status: None   Collection Time: 04/20/21  6:48 PM   Specimen: Nasopharyngeal(NP) swabs in vial transport medium  Result Value Ref Range Status   SARS Coronavirus 2 by RT PCR NEGATIVE NEGATIVE Final    Comment: (NOTE) SARS-CoV-2 target nucleic acids are NOT DETECTED.  The SARS-CoV-2 RNA is generally detectable in upper respiratory specimens during the acute phase of infection. The lowest concentration of SARS-CoV-2 viral copies this assay can detect is 138 copies/mL. A negative result does not preclude SARS-Cov-2 infection and should not be used as the sole basis for treatment or other patient management decisions. A negative result may occur with  improper specimen collection/handling,  submission of specimen other than nasopharyngeal swab, presence of viral mutation(s) within the areas targeted by this assay, and inadequate number of viral copies(<138 copies/mL). A negative result must be combined with clinical observations, patient history, and epidemiological information. The expected result is Negative.  Fact Sheet for Patients:  EntrepreneurPulse.com.au  Fact Sheet for Healthcare Providers:  IncredibleEmployment.be  This test is no t yet approved or cleared by the [place] FDA and  has been authorized for detection and/or diagnosis of SARS-CoV-2 by FDA under an Emergency Use Authorization (EUA). This EUA will remain  in effect (meaning this test can be used) for the duration of the COVID-19 declaration under Section 564(b)(1) of the Act, 21 U.S.C.section 360bbb-3(b)(1), unless the authorization is terminated  or revoked sooner.       Influenza A by PCR NEGATIVE NEGATIVE Final   Influenza B by PCR NEGATIVE NEGATIVE Final    Comment: (NOTE) The Xpert Xpress SARS-CoV-2/FLU/RSV plus assay is intended as an aid in the diagnosis of influenza from Nasopharyngeal swab specimens and should not be used as a sole basis for treatment. Nasal washings and aspirates are unacceptable for Xpert Xpress SARS-CoV-2/FLU/RSV testing.  Fact Sheet for Patients: EntrepreneurPulse.com.au  Fact Sheet for Healthcare Providers: IncredibleEmployment.be  This test is not yet approved or cleared by the [place] FDA and has been authorized for detection and/or diagnosis of SARS-CoV-2 by FDA under an Emergency Use Authorization (EUA). This EUA will remain in effect (meaning this test can be used) for the duration of the COVID-19 declaration under Section 564(b)(1) of the Act, 21 U.S.C. section 360bbb-3(b)(1), unless the authorization is terminated or revoked.  Performed at [facility], [place] [address] [place], [place] [address]   Culture, blood (routine x 2)     Status: None (Preliminary result)   Collection Time: 04/20/21  6:48 PM   Specimen: BLOOD  Result Value Ref Range Status   Specimen Description BLOOD LEFT ANTECUBITAL  Final   Special Requests   Final    BOTTLES DRAWN AEROBIC AND ANAEROBIC Blood Culture adequate volume   Culture   Final    NO GROWTH 3 DAYS Performed at [facility], [place] [address] [place], [place] [address]    Report Status PENDING  Incomplete  Culture, blood (routine x 2)     Status: None (Preliminary result)   Collection Time: 04/20/21  6:52 PM   Specimen: BLOOD LEFT WRIST  Result Value Ref Range Status   Specimen Description BLOOD LEFT WRIST  Final   Special Requests   Final    BOTTLES DRAWN AEROBIC AND ANAEROBIC Blood Culture results may not be optimal due to an inadequate volume of blood received in culture bottles   Culture   Final    NO GROWTH 3 DAYS Performed at [facility], [place] [address], [place], [place] [address]    Report Status PENDING  Incomplete  Aerobic/Anaerobic Culture w Gram Stain (surgical/deep wound)     Status: None (Preliminary result)   Collection Time: 04/23/21  3:21 PM   Specimen: Wound; Abscess  Result Value Ref Range Status   Specimen Description WOUND  Final   Special Requests  KNEE ABCESS  Final   Gram Stain   Final    ABUNDANT WBC PRESENT,BOTH PMN AND MONONUCLEAR MODERATE GRAM POSITIVE COCCI Performed at [facility], [place] [address], [place], [place] [address]    Culture PENDING  Incomplete   Report Status PENDING  Incomplete      Radiology Studies: DG Foot 2 Views Left  Result Date: 04/23/2021 CLINICAL DATA:  Left foot pain, inability to bear weight, no injury EXAM: LEFT FOOT - 2 VIEW COMPARISON:  No prior foot radiographs, correlation is made with ankle radiographs 11/28/2019 FINDINGS: There is no evidence of fracture or dislocation. Bidirectional calcaneal enthesophytes. Degenerative  changes at the tarsometatarsal junctions. Vascular calcifications. Significant soft tissue abnormality. IMPRESSION: Negative. Electronically Signed   By: Merilyn Baba M.D.   On: 04/23/2021 11:07   [place] FINE NEEDLE ASP 1ST LESION  Result Date: 04/23/2021 INDICATION: Right knee lateral subcutaneous fluid collection concerning for hematoma or abscess EXAM: ULTRASOUND ASPIRATION RIGHT LATERAL KNEE SUBCUTANEOUS FLUID COLLECTION MEDICATIONS: The patient is currently admitted to the hospital and receiving intravenous antibiotics. The antibiotics were administered within an appropriate time frame prior to the initiation of the procedure. ANESTHESIA/SEDATION: Moderate Sedation Time:  None. The patient was continuously monitored during the procedure by the interventional radiology nurse under my direct supervision. COMPLICATIONS: None immediate. PROCEDURE: Informed written consent was obtained from the patient after a thorough discussion of the procedural risks, benefits and alternatives. All questions were addressed. Maximal Sterile Barrier Technique was utilized including caps, mask, sterile gowns, sterile gloves, sterile drape, hand hygiene and skin antiseptic. A timeout was performed prior to the initiation of the procedure. Previous imaging reviewed. Preliminary ultrasound performed. The subcutaneous complex fluid collection along the lateral knee margin was localized and marked. This was correlated with the MRI. Under sterile conditions and local anesthesia, both a Yueh sheath needle and a 17 gauge needle were advanced throughout the collection for aspiration. Only 10 cc of thick bloody fluid could be aspirated. Findings suggest infected hematoma. The entire collection could not be decompressed by aspiration because of the complexity. Sample sent for culture. Needle removed. IMPRESSION: Successful ultrasound aspiration of the right lateral knee subcutaneous infected hematoma versus abscess. Electronically Signed    By: Jerilynn Mages.  Shick M.D.   On: 04/23/2021 16:12    Scheduled Meds:  busPIRone  15 mg Oral TID   dofetilide  500 mcg Oral BID   DULoxetine  90 mg Oral Daily   insulin aspart  0-15 Units Subcutaneous TID WC   levothyroxine  125 mcg Oral Q0600   montelukast  10 mg Oral QHS   pantoprazole  40 mg Oral Daily   ramelteon  8 mg Oral QHS   rosuvastatin  5 mg Oral Daily   sodium chloride flush  3 mL Intravenous Q12H   spironolactone  12.5 mg Oral Daily   torsemide  60 mg Oral BID   Continuous Infusions:  ceFEPime (MAXIPIME) IV 2 g (04/23/21 2216)   vancomycin 1,000 mg (04/23/21 0850)     LOS: 3 days   Little Ishikawa, DO [facility] Pager On [place]  If 7PM-7AM, please contact night-coverage 04/24/2021, 7:32 AM

## 2021-04-24 NOTE — Plan of Care (Signed)
  Problem: Clinical Measurements: Goal: Respiratory complications will improve Outcome: Progressing   

## 2021-04-24 NOTE — Progress Notes (Addendum)
Physical Therapy Treatment Patient Details Name: Hannah [name] MRN: [id number] DOB: 03-Oct-1945 Today's Date: 04/24/2021   History of Present Illness 75 yo female with onset of generalized weakness and symptomatic anemia was admitted 10/8 and dx with septic R knee cellulitis.  Previously had fallen, with R knee having developed effusion.  Pt has been transfused, having hgb of 4.7. S/p R knee abscess aspiration 10/11. PMHx:  CHF, a-fib, celluliitis, venous insufficiency, DM, hypothyroidism, PTSD, skin injury from pressure,    PT Comments    Pt agitated upon arrival, thus provided active listening to pt's concerns regarding mobility, PT, pain, and hospital course of action. Educated pt on exercises she can perform as HEP to ensure strengthening and mobility while in hospital when not with PT. Pt required minA to transition supine > sit EOB and minAx2 to transfer to stand to a RW this date. She continues to require modAx2 to stand step laterally with a RW due to her noted leg pain, balance deficits, and lower extremity weakness. Will continue to follow acutely. Current recommendations remain appropriate.    Recommendations for follow up therapy are one component of a multi-disciplinary discharge planning process, led by the attending physician.  Recommendations may be updated based on patient status, additional functional criteria and insurance authorization.  Follow Up Recommendations  SNF     Equipment Recommendations  None recommended by PT    Recommendations for Other Services OT consult     Precautions / Restrictions Precautions Precautions: Fall Precaution Comments: pt has anxiety and weakness; intermittently agitated Restrictions Weight Bearing Restrictions: No     Mobility  Bed Mobility Overal bed mobility: Needs Assistance Bed Mobility: Supine to Sit     Supine to sit: Min assist;HOB elevated     General bed mobility comments: Pt initiated transition supine > sit  moving legs off bed, using rails to pull trunk with HOB elevated to sit up. MinA to complete trunk ascension and scoot hips to EOB.    Transfers Overall transfer level: Needs assistance Equipment used: Rolling walker (2 wheeled);2 person hand held assist Transfers: Sit to/from [facility] Sit to Stand: +2 physical assistance;+2 safety/equipment;From elevated surface;Min assist Stand pivot transfers: Mod assist;+2 physical assistance;+2 safety/equipment       General transfer comment: EOB elevated and cues repeatedly provided for hand placement. MinAx2 to power up to stand, cuing at hips to extend. ModAx2 to steady, provide cues for sequencing steps, and direct RW to stand step to R bed > recliner.  Ambulation/Gait Ambulation/Gait assistance: Mod assist;+2 physical assistance;+2 safety/equipment Gait Distance (Feet): 2 Feet Assistive device: Rolling walker (2 wheeled) Gait Pattern/deviations: Decreased stride length;Decreased weight shift to left;Decreased stance time - left;Antalgic;Trunk flexed Gait velocity: reduced Gait velocity interpretation: <1.31 ft/sec, indicative of household ambulator General Gait Details: Pt with lateral steps to R bed > recliner with RW, cuing to extend hips and sequence weight shifting and steps. Cues and assistance provided to manage RW. Pt reporting L foot/ankle pain and displaying mildly antalgic gait pattern.   Stairs             Wheelchair Mobility    Modified Rankin (Stroke Patients Only)       Balance Overall balance assessment: Needs assistance;History of Falls Sitting-balance support: Feet supported Sitting balance-Leahy Scale: Fair Sitting balance - Comments: Static sitting EOB with supervision.   Standing balance support: Bilateral upper extremity supported;During functional activity Standing balance-Leahy Scale: Poor Standing balance comment: Bil UE support and external physical assistance provided for  standing.                            Cognition Arousal/Alertness: Awake/alert Behavior During Therapy: Anxious;Agitated Overall Cognitive Status: Difficult to assess                                 General Comments: Pt agitated stating that PT has not been back in days, even though educated pt that PT unfortunately cannot come every day. Educated pt that her frequency is 3x/week and that PT will normally only see her 1x during a day. Pt then gets distracted by other issue/complaint, provided active listening, addressed questions as well as I could, contacted whoever could answer her other questions. Pt would re-discuss issues repeatedly.      Exercises Other Exercises Other Exercises: Reviewed LAQ, hip abd/add, SLR, ankle circles and UE AROM to promote strengthening and mobility outside of PT sessions    General Comments General comments (skin integrity, edema, etc.): Extensive time provided for active listening, answering pt's questions, providing encouragement, and addressing pt's needs. Pt reported feeling comfortable and verbalized understanding that PT would not return today and to call nursing when she wants to get back to bed. educated pt on performing seated and supine exercises to ensure strengthening when not with PT      Pertinent Vitals/Pain Pain Assessment: Faces Faces Pain Scale: Hurts little more Pain Location: R knee, L ankle/foot Pain Descriptors / Indicators: Discomfort;Grimacing;Guarding Pain Intervention(s): Limited activity within patient's tolerance;Monitored during session;Repositioned    Home Living                      Prior Function            PT Goals (current goals can now be found in the care plan section) Acute Rehab PT Goals Patient Stated Goal: to figure out what is going on with her PT Goal Formulation: With patient Time For Goal Achievement: 05/06/21 Potential to Achieve Goals: Good Progress towards PT goals:  Progressing toward goals    Frequency    Min 3X/week      PT Plan Current plan remains appropriate    Co-evaluation              AM-PAC PT "6 Clicks" Mobility   Outcome Measure  Help needed turning from your back to your side while in a flat bed without using bedrails?: A Little Help needed moving from lying on your back to sitting on the side of a flat bed without using bedrails?: A Little Help needed moving to and from a bed to a chair (including a wheelchair)?: A Lot Help needed standing up from a chair using your arms (e.g., wheelchair or bedside chair)?: A Lot Help needed to walk in hospital room?: A Lot Help needed climbing 3-5 steps with a railing? : Total 6 Click Score: 13    End of Session Equipment Utilized During Treatment: Gait belt Activity Tolerance: Patient tolerated treatment well;Patient limited by fatigue;Treatment limited secondary to agitation Patient left: in chair;with call bell/phone within reach;with chair alarm set Nurse Communication: Mobility status PT Visit Diagnosis: Unsteadiness on feet (R26.81);Muscle weakness (generalized) (M62.81);Pain;Other abnormalities of gait and mobility (R26.89);Difficulty in walking, not elsewhere classified (R26.2) Pain - Right/Left:  (bil) Pain - part of body: Knee;Ankle and joints of foot     Time: 1100-1135 PT Time Calculation (min) (ACUTE ONLY):  35 min  Charges:  $Therapeutic Activity: 23-37 mins                     Moishe Spice, PT, DPT Acute Rehabilitation Services  Pager: [phone] Office: [place] 04/24/2021, 1:13 PM

## 2021-04-24 NOTE — Plan of Care (Signed)
  Problem: Clinical Measurements: Goal: Ability to maintain clinical measurements within normal limits will improve Outcome: Progressing Goal: Will remain free from infection Outcome: Progressing Goal: Diagnostic test results will improve Outcome: Progressing Goal: Respiratory complications will improve Outcome: Progressing Goal: Cardiovascular complication will be avoided Outcome: Progressing   Problem: Activity: Goal: Risk for activity intolerance will decrease Outcome: Progressing   Problem: Coping: Goal: Level of anxiety will decrease Outcome: Progressing   Problem: Pain Managment: Goal: General experience of comfort will improve Outcome: Progressing   

## 2021-04-25 ENCOUNTER — Ambulatory Visit: Payer: PPO | Admitting: Cardiology

## 2021-04-25 LAB — CULTURE, BLOOD (ROUTINE X 2)
Culture: NO GROWTH
Culture: NO GROWTH
Special Requests: ADEQUATE

## 2021-04-25 LAB — GLUCOSE, CAPILLARY
Glucose-Capillary: 123 mg/dL — ABNORMAL HIGH (ref 70–99)
Glucose-Capillary: 127 mg/dL — ABNORMAL HIGH (ref 70–99)
Glucose-Capillary: 123 mg/dL — ABNORMAL HIGH (ref 70–99)
Glucose-Capillary: 116 mg/dL — ABNORMAL HIGH (ref 70–99)

## 2021-04-25 LAB — CBC
HCT: 26.6 % — ABNORMAL LOW (ref 36.0–46.0)
Hemoglobin: 7.6 g/dL — ABNORMAL LOW (ref 12.0–15.0)
MCH: 23.2 pg — ABNORMAL LOW (ref 26.0–34.0)
MCHC: 28.6 g/dL — ABNORMAL LOW (ref 30.0–36.0)
MCV: 81.1 fL (ref 80.0–100.0)
Platelets: 353 10*3/uL (ref 150–400)
RBC: 3.28 MIL/uL — ABNORMAL LOW (ref 3.87–5.11)
RDW: 20.5 % — ABNORMAL HIGH (ref 11.5–15.5)
WBC: 9.1 10*3/uL (ref 4.0–10.5)
nRBC: 0 % (ref 0.0–0.2)

## 2021-04-25 LAB — BASIC METABOLIC PANEL
Anion gap: 11 (ref 5–15)
BUN: 33 mg/dL — ABNORMAL HIGH (ref 8–23)
CO2: 29 mmol/L (ref 22–32)
Calcium: 9.1 mg/dL (ref 8.9–10.3)
Chloride: 97 mmol/L — ABNORMAL LOW (ref 98–111)
Creatinine, Ser: 1.43 mg/dL — ABNORMAL HIGH (ref 0.44–1.00)
GFR, Estimated: 38 mL/min — ABNORMAL LOW (ref >60)
Glucose, Bld: 120 mg/dL — ABNORMAL HIGH (ref 70–99)
Potassium: 4 mmol/L (ref 3.5–5.1)
Sodium: 137 mmol/L (ref 135–145)

## 2021-04-25 MED ORDER — LORAZEPAM 0.5 MG PO TABS
0.5000 mg | ORAL_TABLET | Freq: Once | ORAL | Status: AC | PRN
  Administered 2021-04-25: 0.5 mg via ORAL
  Filled 2021-04-25: qty 1

## 2021-04-25 NOTE — Progress Notes (Signed)
PROGRESS NOTE    Hannah [name]  [id number] DOB: [birth year] DOA: 04/20/2021 PCP: Lujean Amel, MD    Brief Narrative:  75 y.o. female with medical history significant of CHF, atrial fibrillation, cellulitis, venous insufficiency, diabetes, hypothyroidism, PTSD who presents with drainage from her knee. Pt described R knee effusion following mechanical fall in 8/22 with worsening effusion afterwards. Spontaneous self-draining of R knee noted 2 weeks prior to this hospital visit described as mostly bloody, but also purulent as well -orthopedics and interventional radiology consulted to assist with evaluating knee for possible infection.  Status post IR aspiration/drainage and culture on 04/23/2021  Assessment & Plan:   Hemarthrosis of right knee with Cellulitis of the right shin Superficial abscess of right knee, POA Rule out intra-articular infection -Status post mechanical fall with trauma to the right knee -Culture preliminary results show MRSA -concern patient may need prolonged IV antibiotics, may be amenable to PICC line placement and IV antibiotics at home, pending final sensitivities he may have p.o. options -Fluid aspirate culture pending-MRSA preliminary as above -Appreciate both orthopedic and interventional radiology assistance with this difficult case -Aspiration 04/23/2021 -pain and swelling well controlled, no indication for recurrent aspiration or surgical intervention   CHF, unlikely acute exacerbation -Likely symptomatic anemia playing a more major role than true heart failure exacerbation -Patient does not appear to be hypervolemic at this time -Given resolution of symptoms with transfusion acute heart failure likely ruled out   Symptomatic anemia, stable -Hemoglobin improving status post 2 units PRBC -Large right knee hematoma; rule out infectious process as above -Likely exacerbated given Eliquis on home med rec, continues to be held -Hgb trending upwards  appropriately  Atrial fibrillation - Continue home Tikosyn - Eliquis remains on hold as per above  Diabetes - SSI, hypoglycemic protocol ongoing  Hypothyroidism -TSH minimally elevated at 5.239 at intake -Continue increased Synthroid dose at 125 every morning   PTSD - Continue home BuSpar and duloxetine  Hyperlipidemia - Continue home statin  GERD - Continue home PPI   DVT prophylaxis: SCD's Code Status: Full Family Communication: None present  Status is: Inpatient   Inpatient level of care appropriate due to severity of illness  Dispo: The patient is from: Home              Anticipated d/c is to: Home              Patient currently is not medically stable to d/c.   Difficult to place patient No   Consultants:  Orthopedic Surgery IR  Procedures:  Right knee aspiration 04/23/2021  Antimicrobials: Anti-infectives (From admission, onward)    Start     Dose/Rate Route Frequency Ordered Stop   04/23/21 0800  vancomycin (VANCOCIN) IVPB 1000 mg/200 mL premix        1,000 mg 200 mL/hr over 60 Minutes Intravenous Every 24 hours 04/22/21 1213     04/21/21 2200  vancomycin (VANCOREADY) IVPB 1250 mg/250 mL  Status:  Discontinued        1,250 mg 166.7 mL/hr over 90 Minutes Intravenous Every 24 hours 04/20/21 2018 04/22/21 1213   04/21/21 1000  ceFEPIme (MAXIPIME) 2 g in sodium chloride 0.9 % 100 mL IVPB        2 g 200 mL/hr over 30 Minutes Intravenous Every 12 hours 04/20/21 2018     04/20/21 1930  ceFEPIme (MAXIPIME) 2 g in sodium chloride 0.9 % 100 mL IVPB  Status:  Discontinued        2 g  200 mL/hr over 30 Minutes Intravenous Every 24 hours 04/20/21 1919 04/20/21 2018   04/20/21 1930  vancomycin (VANCOCIN) 2,500 mg in sodium chloride 0.9 % 500 mL IVPB        2,500 mg 250 mL/hr over 120 Minutes Intravenous  Once 04/20/21 1919 04/20/21 2325       Subjective: No acute issues or events overnight, patient more appropriate and pleasant today, reasonable conversation  about possible disposition pending cultures and PT recommendations.  Denies nausea vomiting diarrhea constipation headache fevers chills or chest pain  Objective: Vitals:   04/24/21 0835 04/24/21 1106 04/24/21 1929 04/25/21 0552  BP: 103/63 117/86 (!) 120/56 125/83  Pulse: 85 69 80 78  Resp: '18 19 20 16  '$ Temp: 98.7 F (37.1 C) 98.7 F (37.1 C) 99.2 F (37.3 C) 98.6 F (37 C)  TempSrc: Oral Oral Oral Oral  SpO2: 99% 99% 98% 100%  Weight:      Height:        Intake/Output Summary (Last 24 hours) at 04/25/2021 0756 Last data filed at 04/25/2021 0551 Gross per 24 hour  Intake 1907.6 ml  Output 2050 ml  Net -142.4 ml    Filed Weights   04/23/21 0503 04/24/21 0436 04/24/21 0600  Weight: 135.1 kg 133.3 kg 129.7 kg    Examination:  General exam: Awake, laying in bed, in nad Respiratory system: Normal respiratory effort, no wheezing Cardiovascular system: regular rate, s1, s2 Gastrointestinal system: Soft, nondistended, positive BS Central nervous system: CN2-12 grossly intact, strength intact Skin: Right shin swelling improving, right knee bandage clean dry intact; chronic venous stasis changes noted bilateral lower extremities, nonblanching, erythema appears to be resolving Psychiatry: Mood normal//no visual hallucinations   Data Reviewed: I have personally reviewed following labs and imaging studies  CBC: Recent Labs  Lab 04/20/21 1631 04/21/21 0505 04/21/21 1603 04/22/21 0308 04/23/21 0145 04/24/21 0737 04/25/21 0158  WBC 8.5   < > 7.5 9.0 10.2 8.4 9.1  NEUTROABS 7.0  --   --   --   --   --   --   HGB 5.7*   < > 7.4* 7.1* 7.2* 7.4* 7.6*  HCT 22.5*   < > 25.4* 24.5* 25.8* 25.9* 26.6*  MCV 82.4   < > 81.7 82.2 81.4 82.5 81.1  PLT 357   < > 338 305 343 335 353   < > = values in this interval not displayed.    Basic Metabolic Panel: Recent Labs  Lab 04/21/21 0600 04/22/21 0308 04/23/21 0145 04/24/21 0737 04/25/21 0158  NA 138 139 139 141 137  K 4.4 3.6  3.2* 4.1 4.0  CL 106 103 101 99 97*  CO2 '24 26 28 '$ 33* 29  GLUCOSE 176* 106* 136* 115* 120*  BUN 21 25* 25* 27* 33*  CREATININE 1.27* 1.47* 1.42* 1.44* 1.43*  CALCIUM 8.5* 8.5* 8.6* 8.8* 9.1  MG 2.2  --   --   --   --     GFR: Estimated Creatinine Clearance: 48.4 mL/min (A) (by C-G formula based on SCr of 1.43 mg/dL (H)). Liver Function Tests: Recent Labs  Lab 04/22/21 0308 04/23/21 0145 04/24/21 0737  AST 10* 12* 16  ALT '12 10 15  '$ ALKPHOS 81 79 86  BILITOT 1.3* 1.3* 1.1  PROT 7.2 7.6 7.5  ALBUMIN 2.5* 2.5* 2.3*    No results for input(s): LIPASE, AMYLASE in the last 168 hours. No results for input(s): AMMONIA in the last 168 hours. Coagulation Profile: Recent Labs  Lab 04/20/21 1848  INR 1.3*    Cardiac Enzymes: No results for input(s): CKTOTAL, CKMB, CKMBINDEX, TROPONINI in the last 168 hours. BNP (last 3 results) No results for input(s): PROBNP in the last 8760 hours. HbA1C: No results for input(s): HGBA1C in the last 72 hours. CBG: Recent Labs  Lab 04/24/21 0700 04/24/21 1107 04/24/21 1620 04/24/21 2135 04/25/21 0609  GLUCAP 102* 122* 147* 109* 123*    Lipid Profile: No results for input(s): CHOL, HDL, LDLCALC, TRIG, CHOLHDL, LDLDIRECT in the last 72 hours. Thyroid Function Tests: Recent Labs    04/23/21 0145  TSH 5.239*    Anemia Panel: No results for input(s): VITAMINB12, FOLATE, FERRITIN, TIBC, IRON, RETICCTPCT in the last 72 hours. Sepsis Labs: Recent Labs  Lab 04/20/21 1848 04/20/21 2005  LATICACIDVEN 1.9 1.4     Recent Results (from the past 240 hour(s))  Resp Panel by RT-PCR (Flu A&B, Covid)     Status: None   Collection Time: 04/20/21  6:48 PM   Specimen: Nasopharyngeal(NP) swabs in vial transport medium  Result Value Ref Range Status   SARS Coronavirus 2 by RT PCR NEGATIVE NEGATIVE Final    Comment: (NOTE) SARS-CoV-2 target nucleic acids are NOT DETECTED.  The SARS-CoV-2 RNA is generally detectable in upper  respiratory specimens during the acute phase of infection. The lowest concentration of SARS-CoV-2 viral copies this assay can detect is 138 copies/mL. A negative result does not preclude SARS-Cov-2 infection and should not be used as the sole basis for treatment or other patient management decisions. A negative result may occur with  improper specimen collection/handling, submission of specimen other than nasopharyngeal swab, presence of viral mutation(s) within the areas targeted by this assay, and inadequate number of viral copies(<138 copies/mL). A negative result must be combined with clinical observations, patient history, and epidemiological information. The expected result is Negative.  Fact Sheet for Patients:  EntrepreneurPulse.com.au  Fact Sheet for Healthcare Providers:  IncredibleEmployment.be  This test is no t yet approved or cleared by the [place] FDA and  has been authorized for detection and/or diagnosis of SARS-CoV-2 by FDA under an Emergency Use Authorization (EUA). This EUA will remain  in effect (meaning this test can be used) for the duration of the COVID-19 declaration under Section 564(b)(1) of the Act, 21 U.S.C.section 360bbb-3(b)(1), unless the authorization is terminated  or revoked sooner.       Influenza A by PCR NEGATIVE NEGATIVE Final   Influenza B by PCR NEGATIVE NEGATIVE Final    Comment: (NOTE) The Xpert Xpress SARS-CoV-2/FLU/RSV plus assay is intended as an aid in the diagnosis of influenza from Nasopharyngeal swab specimens and should not be used as a sole basis for treatment. Nasal washings and aspirates are unacceptable for Xpert Xpress SARS-CoV-2/FLU/RSV testing.  Fact Sheet for Patients: EntrepreneurPulse.com.au  Fact Sheet for Healthcare Providers: IncredibleEmployment.be  This test is not yet approved or cleared by the [place] FDA and has been  authorized for detection and/or diagnosis of SARS-CoV-2 by FDA under an Emergency Use Authorization (EUA). This EUA will remain in effect (meaning this test can be used) for the duration of the COVID-19 declaration under Section 564(b)(1) of the Act, 21 U.S.C. section 360bbb-3(b)(1), unless the authorization is terminated or revoked.  Performed at [facility], [place] [address] [place], [place] [address]   Culture, blood (routine x 2)     Status: None (Preliminary result)   Collection Time: 04/20/21  6:48 PM   Specimen: BLOOD  Result  Value Ref Range Status   Specimen Description BLOOD LEFT ANTECUBITAL  Final   Special Requests   Final    BOTTLES DRAWN AEROBIC AND ANAEROBIC Blood Culture adequate volume   Culture   Final    NO GROWTH 4 DAYS Performed at [facility], [address], [place], [place] [address]    Report Status PENDING  Incomplete  Culture, blood (routine x 2)     Status: None (Preliminary result)   Collection Time: 04/20/21  6:52 PM   Specimen: BLOOD LEFT WRIST  Result Value Ref Range Status   Specimen Description BLOOD LEFT WRIST  Final   Special Requests   Final    BOTTLES DRAWN AEROBIC AND ANAEROBIC Blood Culture results may not be optimal due to an inadequate volume of blood received in culture bottles   Culture   Final    NO GROWTH 4 DAYS Performed at [facility], [place] [address], [place], [place] [address]    Report Status PENDING  Incomplete  Aerobic/Anaerobic Culture w Gram Stain (surgical/deep wound)     Status: None (Preliminary result)   Collection Time: 04/23/21  3:21 PM   Specimen: Wound; Abscess  Result Value Ref Range Status   Specimen Description WOUND  Final   Special Requests  KNEE ABCESS  Final   Gram Stain   Final    ABUNDANT WBC PRESENT,BOTH PMN AND MONONUCLEAR MODERATE GRAM POSITIVE COCCI    Culture   Final    ABUNDANT STAPHYLOCOCCUS AUREUS SUSCEPTIBILITIES TO FOLLOW Performed at [facility], [place] [address] [place], [place] [address]    Report Status PENDING  Incomplete      Radiology Studies: DG Foot 2 Views Left  Result Date: 04/23/2021 CLINICAL DATA:  Left foot pain, inability to bear weight, no injury EXAM: LEFT FOOT - 2 VIEW COMPARISON:  No prior foot radiographs, correlation is made with ankle radiographs 11/28/2019 FINDINGS: There is no evidence of fracture or dislocation. Bidirectional calcaneal enthesophytes. Degenerative changes at the tarsometatarsal junctions. Vascular calcifications. Significant soft tissue abnormality. IMPRESSION: Negative. Electronically Signed   By: Merilyn Baba M.D.   On: 04/23/2021 11:07   [place] FINE NEEDLE ASP 1ST LESION  Result Date: 04/23/2021 INDICATION: Right knee lateral subcutaneous fluid collection concerning for hematoma or abscess EXAM: ULTRASOUND ASPIRATION RIGHT LATERAL KNEE SUBCUTANEOUS FLUID COLLECTION MEDICATIONS: The patient is currently admitted to the hospital and receiving intravenous antibiotics. The antibiotics were administered within an appropriate time frame prior to the initiation of the procedure. ANESTHESIA/SEDATION: Moderate Sedation Time:  None. The patient was continuously monitored during the procedure by the interventional radiology nurse under my direct supervision. COMPLICATIONS: None immediate. PROCEDURE: Informed written consent was obtained from the patient after a thorough discussion of the procedural risks, benefits and alternatives. All questions were addressed. Maximal Sterile Barrier Technique was utilized including caps, mask, sterile gowns, sterile gloves, sterile drape, hand hygiene and skin antiseptic. A timeout was performed prior to the initiation of the procedure. Previous imaging reviewed. Preliminary ultrasound performed. The subcutaneous complex fluid collection along the lateral knee margin was localized and marked. This was correlated with the MRI. Under sterile conditions and local anesthesia, both a Yueh sheath needle  and a 17 gauge needle were advanced throughout the collection for aspiration. Only 10 cc of thick bloody fluid could be aspirated. Findings suggest infected hematoma. The entire collection could not be decompressed by aspiration because of the complexity. Sample sent for culture. Needle removed. IMPRESSION: Successful  ultrasound aspiration of the right lateral knee subcutaneous infected hematoma versus abscess. Electronically Signed   By: Jerilynn Mages.  Shick M.D.   On: 04/23/2021 16:12    Scheduled Meds:  busPIRone  15 mg Oral TID   dofetilide  500 mcg Oral BID   DULoxetine  90 mg Oral Daily   hydrocerin   Topical BID   insulin aspart  0-15 Units Subcutaneous TID WC   levothyroxine  125 mcg Oral Q0600   montelukast  10 mg Oral QHS   pantoprazole  40 mg Oral Daily   ramelteon  8 mg Oral QHS   rosuvastatin  5 mg Oral Daily   sodium chloride flush  3 mL Intravenous Q12H   spironolactone  12.5 mg Oral Daily   torsemide  60 mg Oral BID   Continuous Infusions:  ceFEPime (MAXIPIME) IV 2 g (04/24/21 2129)   vancomycin 1,000 mg (04/24/21 1022)     LOS: 4 days   Little Ishikawa, DO [facility] Pager On Epic/Secure chat  If 7PM-7AM, please contact night-coverage 04/25/2021, 7:56 AM

## 2021-04-25 NOTE — Progress Notes (Signed)
Pharmacy Antibiotic Note  Hannah [name] is a 75 y.o. female admitted on 04/20/2021 with  wound infection .  [place] medic[place] history of T2DM, HTN, pAF (on eliquis), HFpEF, hypothyroidism. Patient presented ing with swelling, redness, spontaneous purulent drainage to her right anterior knee since a fall on 8/31. Pharmacy consulted on 10/8 for cefepime and vancomycin dosing for septic R knee w/cellulitis/abscess. R/o intra-articular infection.  LA 1.9>>1.4;  WBC wnl stable,  afebrile Scr up 1.27>1.47>1.42>1.44> 1.43, SCr stable in 1.4s x 4 days. UOP 2050 ml/24hr (10/12) MD noted  erythema and edema has improved on ABXs.    S/p drainage of R knee>10/11. Today 10/13 the knee abscess culture from 10/11 is  positive for MRSA.  MD notes concerned patient may need prolonged course of IV antibiotics.      Plan: Continue Vancomycin dose to 1000 mg IV q24h . Goal AUC 400-550  Continue Cefepime 2 g q12h Trend WBC, fever, renal function, clinical course, cultures and monitor Vancomycin levels at steady state per protocol. Will check vancomycin peak Friday 10/14 at 10AM and trough on Saturday 10/15 at 0730.    Height: '5\' 7"'$  (170.2 cm) Weight: 129.7 kg (285 lb 15 oz) IBW/kg (Calculated) : 61.6  Temp (24hrs), Avg:98.7 F (37.1 C), Min:98.2 F (36.8 C), Max:99.2 F (37.3 C)  Recent Labs  Lab 04/20/21 1848 04/20/21 2005 04/21/21 0505 04/21/21 0600 04/21/21 1603 04/22/21 0308 04/23/21 0145 04/24/21 0737 04/25/21 0158  WBC  --   --    < >  --  7.5 9.0 10.2 8.4 9.1  CREATININE  --   --   --  1.27*  --  1.47* 1.42* 1.44* 1.43*  LATICACIDVEN 1.9 1.4  --   --   --   --   --   --   --    < > = values in this interval not displayed.     Estimated Creatinine Clearance: 48.4 mL/min (A) (by C-G formula based on SCr of 1.43 mg/dL (H)).    Allergies  Allergen Reactions   Azithromycin Rash and Other (See Comments)    Severe rash   Antimicrobials this admission: cefepime 10/8 >>  vancomycin 10/8  >>    Levels, dose adjustments:  10/10: Scr up : Adjust Vanc to 1000 mg IV q24h  (eAUC 470,SCr used 1.47, Vd used 0.5 L/kg d/t BMI 46)  Microbiology results: 10/8 BCx:  negative 10/8 covid /flu pcr: neg 10/11 R knee abscess/wound cx: MRSA    Thank you for allowing pharmacy to be a part of this patient's care.  Nicole Cella, RPh Clinical Pharmacist [phone] 04/25/2021 5:28 PM Please check AMION for all [place] phone numbers After 10:00 PM, call [place] [phone]

## 2021-04-25 NOTE — Care Management Important Message (Signed)
Important Message  Patient Details  Name: Hannah [name] MRN: [id number] Date of Birth: 04/23/1946   Medicare Important Message Given:  Yes     Shelda Altes 04/25/2021, 9:21 AM

## 2021-04-25 NOTE — Evaluation (Signed)
Occupational Therapy Evaluation Patient Details Name: Hannah [name] MRN: [id number] DOB: 28-Nov-1945 Today's Date: 04/25/2021   History of Present Illness 75 yo female with onset of generalized weakness and symptomatic anemia was admitted 10/8 and dx with septic R knee cellulitis.  Previously had fallen, with R knee having developed effusion.  Pt has been transfused, having hgb of 4.7. S/p R knee abscess aspiration 10/11. PMHx:  CHF, a-fib, celluliitis, venous insufficiency, DM, hypothyroidism, PTSD, skin injury from pressure,   Clinical Impression   PTA, pt lives with daughter and reports Modified Independence with ADLs, household IADLs and mobility with intermittent use of cane. Pt reports SOB with short distance mobility and prolonged standing at baseline, so performs many tasks seated at home. Pt presents now with new R LE pain but able to mobilize using RW with min guard at most. Pt did spent initial part of session agitated with concerns over current hospital course - active listening required. Pt Independent for UB ADLs and Min A for LB ADLs. Educated on energy conservation strategies and fall prevention at home. Encouraged pt to have daughter present for showering tasks, assist for ADLs as needed initially. Would recommend HHOT follow-up at DC to maximize safety/independence with ADLs/IADLs.       Recommendations for follow up therapy are one component of a multi-disciplinary discharge planning process, led by the attending physician.  Recommendations may be updated based on patient status, additional functional criteria and insurance authorization.   Follow Up Recommendations  Home health OT;Supervision - Intermittent    Equipment Recommendations  Other (comment) (Rolling walker)    Recommendations for Other Services       Precautions / Restrictions Precautions Precautions: Fall Precaution Comments: agitation, fearful of falling Restrictions Weight Bearing Restrictions: No       Mobility Bed Mobility               General bed mobility comments: up in chair    Transfers Overall transfer level: Needs assistance Equipment used: Rolling walker (2 wheeled);2 person hand held assist Transfers: Sit to/from Stand Sit to Stand: Supervision         General transfer comment: Supervision for sit to stand with RW (pt declined to push from chair and reports plan to pull on RW because that is what she does with Rollator. pt reports Rollator more sturdy/safe than RW - would not receive education regarding this)    Balance Overall balance assessment: Needs assistance;History of Falls Sitting-balance support: Feet supported Sitting balance-Leahy Scale: Good     Standing balance support: Bilateral upper extremity supported;During functional activity Standing balance-Leahy Scale: Poor Standing balance comment: reliant on UE support                           ADL either performed or assessed with clinical judgement   ADL Overall ADL's : Needs assistance/impaired Eating/Feeding: Independent;Sitting   Grooming: Set up;Sitting;Brushing hair Grooming Details (indicate cue type and reason): did this task seated at sink (reports doing this at baseline due to SOB). extended time needed for task due to knots Upper Body Bathing: Independent;Sitting   Lower Body Bathing: Minimal assistance;Sit to/from stand   Upper Body Dressing : Independent;Sitting   Lower Body Dressing: Minimal assistance;Sit to/from stand   Toilet Transfer: Min guard;Ambulation;RW;BSC Toilet Transfer Details (indicate cue type and reason): no LOB, no physical assist needed to stand Toileting- Water quality scientist and Hygiene: Min guard;Sit to/from stand       Functional  mobility during ADLs: Min guard;Rolling walker General ADL Comments: Pt with minor limitations due to R foot pain but able to mobilize with 1 person assist in room using RW. Pt limited in ADL completion due to  need for frequent rest breaks and SOB - which pt reports this as baseline. discussed fall prevention strategies at home     Vision Baseline Vision/History: 1 Wears glasses Ability to See in Adequate Light: 0 Adequate Patient Visual Report: No change from baseline Vision Assessment?: No apparent visual deficits     Perception     Praxis      Pertinent Vitals/Pain Pain Assessment: Faces Faces Pain Scale: Hurts little more Pain Location: R foot Pain Descriptors / Indicators: Discomfort;Grimacing Pain Intervention(s): Monitored during session     Hand Dominance Right   Extremity/Trunk Assessment Upper Extremity Assessment Upper Extremity Assessment: Overall WFL for tasks assessed   Lower Extremity Assessment Lower Extremity Assessment: Defer to PT evaluation   Cervical / Trunk Assessment Cervical / Trunk Assessment: Kyphotic   Communication Communication Communication: No difficulties   Cognition Arousal/Alertness: Awake/alert Behavior During Therapy: Anxious;Agitated Overall Cognitive Status: No family/caregiver present to determine baseline cognitive functioning                                 General Comments: Pt spent first part of session complaining about staff/care at facility, agitated and difficult to redirect at times. Per nursing,pt with psych issues and reports many of these reports are false. able to follow directions, not very receptive to safety education though aware of SOB deficits   General Comments       Exercises     Shoulder Instructions      Home Living Family/patient expects to be discharged to:: Private residence Living Arrangements: Children Available Help at Discharge: Family;Available PRN/intermittently Type of Home: House Home Access: Stairs to enter [employer] of Steps: 1 Entrance Stairs-Rails: None Home Layout: One level     Bathroom Shower/Tub: Tub/shower unit;Walk-in shower   Bathroom Toilet:  Standard     Home Equipment: Cane - single point;Shower seat;Walker - 4 wheels          Prior Functioning/Environment Level of Independence: Independent with assistive device(s)        Comments: reports intermittent use of cane, has rollator (though does not appear to use). Reports able to complete ADLs, household IADLs though fatigues easily. sits to wash dishes, brush hair, etc. SOB with activity at baseline        OT Problem List: Decreased strength;Decreased activity tolerance;Impaired balance (sitting and/or standing);Cardiopulmonary status limiting activity;Decreased safety awareness;Decreased cognition;Pain      OT Treatment/Interventions: Self-care/ADL training;Therapeutic exercise;Energy conservation;DME and/or AE instruction;Therapeutic activities;Patient/family education;Balance training    OT Goals(Current goals can be found in the care plan section) Acute Rehab OT Goals Patient Stated Goal: receive results OT Goal Formulation: With patient Time For Goal Achievement: 05/09/21 Potential to Achieve Goals: Good  OT Frequency: Min 2X/week   Barriers to D/C:            Co-evaluation              AM-PAC OT "6 Clicks" Daily Activity     Outcome Measure Help from another person eating meals?: None Help from another person taking care of personal grooming?: A Little Help from another person toileting, which includes using toliet, bedpan, or urinal?: A Little Help from another person bathing (including washing, rinsing, drying)?:  A Little Help from another person to put on and taking off regular upper body clothing?: None Help from another person to put on and taking off regular lower body clothing?: A Little 6 Click Score: 20   End of Session Equipment Utilized During Treatment: Gait belt;Rolling walker Nurse Communication: Mobility status;Other (comment) (need for purewick insertion)  Activity Tolerance: Patient tolerated treatment well Patient left: in  chair;with call bell/phone within reach;with chair alarm set  OT Visit Diagnosis: Unsteadiness on feet (R26.81);Other abnormalities of gait and mobility (R26.89);Muscle weakness (generalized) (M62.81);History of falling (Z91.81);Pain Pain - Right/Left: Right Pain - part of body: Ankle and joints of foot                Time: [id number] OT Time Calculation (min): 46 min Charges:  OT General Charges $OT Visit: 1 Visit OT Evaluation $OT Eval Moderate Complexity: 1 Mod OT Treatments $Self Care/Home Management : 8-22 mins $Therapeutic Activity: 8-22 mins  Malachy Chamber, OTR/L Acute Rehab Services Office: [phone]   Layla Maw 04/25/2021, 9:57 AM

## 2021-04-25 NOTE — TOC Progression Note (Addendum)
Transition of Care [facility]) - Progression Note    Patient Details  Name: Hannah [name] MRN: [id number] Date of Birth: March 27, 1946  [facility] CM/SW Contact  Zenon Mayo, RN Phone Number: 04/25/2021, 1:44 PM  Clinical Narrative:    NCM spoke with patient at the bediside , was notified she is refusing SNF, offered choice, for HHPT, HHOT,  she states she does not have a preference. She states she has a shower chair, walker and two canes at home.  She will need transport at discharge. She usually uses [facility] but they need 24 hr notice.  NCM made referral to [facility] with [facility], she is able to take referral.  Soc will begin  24 to 48 hrs post dc.     Expected Discharge Plan: Skilled Nursing Facility Barriers to Discharge: Continued Medical Work up  Expected Discharge Plan and Services Expected Discharge Plan: [place]   Discharge Planning Services: CM Consult Post Acute Care Choice: [place] arrangements for the past 2 months: Single Family Home                   DME Agency: NA       HH Arranged: PT, OT HH Agency: [place] Date [place]: 04/25/21 Time [place]: [id number] Representative spoke with at [place]: Cyndi   Social Determinants of Health ([place]) Interventions    Readmission Risk Interventions No flowsheet data found.

## 2021-04-25 NOTE — Progress Notes (Signed)
Pt is currently sitting in chair. Pt stating that she wants to go home and waiting to hear news from MD this morning.  Call bell within reach.

## 2021-04-25 NOTE — Progress Notes (Signed)
Pt stated to staff that she has not received her medications.  This nurse administered morning medications after checking bp this morning at 0842.  Pt has just now finished working with PT.  Nursing assistant with pt at this time.

## 2021-04-26 ENCOUNTER — Other Ambulatory Visit (HOSPITAL_COMMUNITY): Payer: Self-pay

## 2021-04-26 LAB — BASIC METABOLIC PANEL
Anion gap: 12 (ref 5–15)
BUN: 42 mg/dL — ABNORMAL HIGH (ref 8–23)
CO2: 28 mmol/L (ref 22–32)
Calcium: 9.4 mg/dL (ref 8.9–10.3)
Chloride: 95 mmol/L — ABNORMAL LOW (ref 98–111)
Creatinine, Ser: 1.81 mg/dL — ABNORMAL HIGH (ref 0.44–1.00)
GFR, Estimated: 29 mL/min — ABNORMAL LOW (ref >60)
Glucose, Bld: 113 mg/dL — ABNORMAL HIGH (ref 70–99)
Potassium: 3.7 mmol/L (ref 3.5–5.1)
Sodium: 135 mmol/L (ref 135–145)

## 2021-04-26 LAB — CBC
HCT: 26.7 % — ABNORMAL LOW (ref 36.0–46.0)
Hemoglobin: 7.7 g/dL — ABNORMAL LOW (ref 12.0–15.0)
MCH: 23.3 pg — ABNORMAL LOW (ref 26.0–34.0)
MCHC: 28.8 g/dL — ABNORMAL LOW (ref 30.0–36.0)
MCV: 80.9 fL (ref 80.0–100.0)
Platelets: 353 10*3/uL (ref 150–400)
RBC: 3.3 MIL/uL — ABNORMAL LOW (ref 3.87–5.11)
RDW: 20.4 % — ABNORMAL HIGH (ref 11.5–15.5)
WBC: 9.8 10*3/uL (ref 4.0–10.5)
nRBC: 0 % (ref 0.0–0.2)

## 2021-04-26 LAB — GLUCOSE, CAPILLARY: Glucose-Capillary: 123 mg/dL — ABNORMAL HIGH (ref 70–99)

## 2021-04-26 MED ORDER — POTASSIUM CHLORIDE CRYS ER 20 MEQ PO TBCR: 40.0000 meq | EXTENDED_RELEASE_TABLET | Freq: Once | ORAL | Status: DC

## 2021-04-26 MED ORDER — LINEZOLID 600 MG PO TABS: 600.0000 mg | ORAL_TABLET | Freq: Two times a day (BID) | ORAL | Status: DC

## 2021-04-26 MED ORDER — DOXYCYCLINE HYCLATE 100 MG PO TABS
100.0000 mg | ORAL_TABLET | Freq: Two times a day (BID) | ORAL | 0 refills | Status: AC
  Filled 2021-04-26: qty 28, 14d supply, fill #0

## 2021-04-26 MED ORDER — LINEZOLID 600 MG PO TABS
600.0000 mg | ORAL_TABLET | Freq: Two times a day (BID) | ORAL | 0 refills | Status: DC
  Filled 2021-04-26: qty 28, 14d supply, fill #0

## 2021-04-26 MED ORDER — DOFETILIDE 250 MCG PO CAPS: 250.0000 ug | ORAL_CAPSULE | Freq: Two times a day (BID) | ORAL | 0 refills | Status: DC

## 2021-04-26 MED ORDER — DOXYCYCLINE HYCLATE 100 MG PO TABS: 100.0000 mg | ORAL_TABLET | Freq: Two times a day (BID) | ORAL | Status: DC

## 2021-04-26 MED ORDER — LEVOTHYROXINE SODIUM 125 MCG PO TABS
125.0000 ug | ORAL_TABLET | Freq: Every day | ORAL | 0 refills | Status: DC
  Filled 2021-04-26: qty 30, 30d supply, fill #0

## 2021-04-26 MED ORDER — FERROUS SULFATE 325 (65 FE) MG PO TABS
325.0000 mg | ORAL_TABLET | Freq: Two times a day (BID) | ORAL | 0 refills | Status: DC
  Filled 2021-04-26: qty 60, 30d supply, fill #0

## 2021-04-26 NOTE — Discharge Summary (Signed)
Physician Discharge Summary  Hannah [name] [id number] DOB: May 13, 1946 DOA: 04/20/2021  PCP: Lujean Amel, MD  Admit date: 04/20/2021 Discharge date: 04/26/2021  Admitted From: Home  Disposition:   Home with home health   Recommendations for Outpatient Follow-up:  Follow up with PCP in 1-2 weeks Please obtain BMP/CBC in one week Cardiology scheduling for repeat testing and drug monitoring.   Home Health:PT/OT  Equipment/Devices:none    Discharge Condition:fair   CODE STATUS:Full code  Diet recommendation: low salt , low carb diet   Discharge Summary: 75 y.o. female with medical history significant of CHF,chronic atrial fibrillation, cellulitis, venous insufficiency, diabetes, hypothyroidism, PTSD who presented with drainage from her right knee. Pt described R knee effusion following mechanical fall in 8/22 with worsening effusion afterwards. Spontaneous self-draining of R knee noted 2 weeks prior to this hospital visit described as mostly bloody, but also purulent as well -orthopedics and interventional radiology consulted to assist with evaluating knee for possible infection.  Status post IR aspiration/drainage and culture on 04/23/2021 consistent with MRSA growth.   Hemarthrosis of right knee with Cellulitis of the right shin Superficial abscess of right knee, POA with MRSA infection Ruled out intra-articular infection, MRI negative for intra-articular infection or effusion. -Status post mechanical fall with trauma to the right knee -Culture results show MRSA.  Orthopedics recommended treatment for soft tissue infection which is spontaneously draining.  No evidence of joint space infection.  Patient received 5 days of vancomycin and cefepime. Will treat with doxycycline for 2 weeks.   -Aspiration 04/23/2021 -pain and swelling well controlled, no indication for recurrent aspiration or surgical intervention as per surgery.  Symptomatic anemia.  Acute on chronic.  Chronic  anemia due to renal failure. Presented with hemoglobin of 5.6.  Received 2 units of PRBC with appropriate response.  No further bleeding.  FOBT negative.  Patient on Eliquis that she will resume.  Likely chronic anemia and exacerbated with hematoma and blood loss. Also prescribed iron replacement on discharge.  Paroxysmal atrial fibrillation/CKD stage IIIb: Patient on Tikosyn, dose was reduced as recommended by her cardiologist because of slight decrease in renal function and creatinine clearance.  Dose changed to 250 mg twice a day.  Called into the pharmacy. No evidence of active bleeding, she will benefit with continuing Eliquis. TSH was mildly elevated, Synthroid dose increased to 125 mcg daily. Patient is on Jardiance for her diabetes which is well controlled that she will continue.   Remains chronically sick but is stable today.  Desires to go home.  Discharged on oral antibiotics.  Discussed with cardiology who will schedule follow-up next week to recheck her renal functions and adjust her Tikosyn doses.  Potassium was replaced before discharge.    Discharge Diagnoses:  Principal Problem:   Symptomatic anemia Active Problems:   CHF (congestive heart failure) (HCC)   PTSD (post-traumatic stress disorder)   PAF (paroxysmal atrial fibrillation) (HCC)   Hypothyroidism   DMII (diabetes mellitus, type 2) (HCC)   Cellulitis   Septic arthritis (HCC)   Pressure injury of skin    Discharge Instructions  Discharge Instructions     Call MD for:  redness, tenderness, or signs of infection (pain, swelling, redness, odor or green/yellow discharge around incision site)   Complete by: As directed    Call MD for:  temperature >100.4   Complete by: As directed    Diet - low sodium heart healthy   Complete by: As directed    Increase activity slowly   Complete  by: As directed    No dressing needed   Complete by: As directed    Keep it clean and dry , covered      Allergies as of  04/26/2021       Reactions   Azithromycin Rash, Other (See Comments)   Severe rash        Medication List     TAKE these medications    acetaminophen 500 MG tablet Commonly known as: TYLENOL Take 500 mg by mouth every 6 (six) hours as needed for moderate pain.   Bariatric Rollator Misc For home use   busPIRone 15 MG tablet Commonly known as: BUSPAR TAKE 1 TABLET THREE TIMES A DAY   diclofenac Sodium 1 % Gel Commonly known as: Voltaren Apply 2 g topically 4 (four) times daily. What changed:  when to take this reasons to take this   dofetilide 250 MCG capsule Commonly known as: TIKOSYN Take 1 capsule (250 mcg total) by mouth 2 (two) times daily. What changed:  medication strength how much to take when to take this   doxycycline 100 MG tablet Commonly known as: VIBRA-TABS Take 1 tablet (100 mg total) by mouth 2 (two) times daily for 14 days.   DULoxetine 30 MG capsule Commonly known as: CYMBALTA Take 3 capsules (90 mg total) by mouth daily.   Eliquis 5 MG Tabs tablet Generic drug: apixaban Take 1 tablet (5 mg total) by mouth 2 (two) times daily.   empagliflozin 10 MG Tabs tablet Commonly known as: Jardiance Take 1 tablet (10 mg total) by mouth daily before breakfast.   esomeprazole 40 MG capsule Commonly known as: NEXIUM Take 40 mg by mouth as needed (indigestion/heartburn).   FeroSul 325 (65 FE) MG tablet Generic drug: ferrous sulfate Take 1 tablet (325 mg total) by mouth 2 (two) times daily.   levocetirizine 5 MG tablet Commonly known as: XYZAL Take 5 mg by mouth at bedtime.   levothyroxine 125 MCG tablet Commonly known as: SYNTHROID Take 1 tablet (125 mcg total) by mouth daily. What changed:  medication strength how much to take   lidocaine 5 % Commonly known as: Lidoderm Place 1 patch onto the skin daily. Remove & Discard patch within 12 hours or as directed by MD   montelukast 10 MG tablet Commonly known as: SINGULAIR Take 10 mg by  mouth at bedtime.   Nyamyc powder Generic drug: nystatin Apply 1 application topically as needed (rash under breasts).   potassium chloride SA 20 MEQ tablet Commonly known as: KLOR-CON Take 20-40 mEq by mouth 2 (two) times daily. She is taking 40 mEq in AM/20 mEq in PM   PreserVision AREDS Caps Take 1 capsule by mouth in the morning and at bedtime.   rosuvastatin 5 MG tablet Commonly known as: CRESTOR Take 5 mg by mouth daily.   spironolactone 25 MG tablet Commonly known as: ALDACTONE Take 0.5 tablets (12.5 mg total) by mouth daily.   torsemide 20 MG tablet Commonly known as: DEMADEX TAKE THREE TABLETS BY MOUTH TWICE A DAY   triamcinolone cream 0.1 % Commonly known as: KENALOG Apply 1 application topically daily as needed (rash).   zolpidem 10 MG tablet Commonly known as: AMBIEN Take 10 mg by mouth at bedtime.               Discharge Care Instructions  (From admission, onward)           Start     Ordered   04/26/21 0000  No dressing  needed       Comments: Keep it clean and dry , covered   04/26/21 0911            Follow-up Information     [facility], [facility] Follow up.   Specialty: Home Health Services Why: HHPT, HHOT, they will call you with apt times Contact information: [address] [place] [address] [phone]         Lujean Amel, MD. Go on 05/08/2021.   Specialty: Family Medicine Why: '@4'$ :30pm Contact information: [address] [place] [address] [phone]         Constance Haw, MD. Go on 05/01/2021.   Specialty: Cardiology Why: '@11'$ :45am Contact information: [address] [place] [address] [phone]                Allergies  Allergen Reactions   Azithromycin Rash and Other (See Comments)    Severe rash    Consultations: Orthopedics Interventional radiology   Procedures/Studies: DG Chest 2 View  Result Date:  04/20/2021 CLINICAL DATA:  Shortness of breath EXAM: CHEST - 2 VIEW COMPARISON:  11/26/2019 FINDINGS: Bilateral mild interstitial thickening. No focal consolidation. No pleural effusion or pneumothorax. Stable cardiomegaly. No acute osseous abnormality. IMPRESSION: Cardiomegaly with pulmonary vascular congestion. Electronically Signed   By: Kathreen Devoid M.D.   On: 04/20/2021 17:53   DG Lumbar Spine Complete  Result Date: 04/20/2021 CLINICAL DATA:  Pain post fall. EXAM: LUMBAR SPINE - COMPLETE 4+ VIEW COMPARISON:  None. FINDINGS: There is no evidence of lumbar spine fracture. Alignment is normal. Multilevel spondylosis and posterior facet arthropathy. IMPRESSION: 1. No acute fracture or dislocation identified about the lumbosacral spine. 2. Multilevel spondylosis and posterior facet arthropathy. Electronically Signed   By: Fidela Salisbury M.D.   On: 04/20/2021 17:47   MR KNEE RIGHT WO CONTRAST  Result Date: 04/22/2021 CLINICAL DATA:  Intermittent knee pain since falling 2 months ago. Recent development of erythema and drainage from the over the last week. Clinical concern for septic arthritis. EXAM: MRI OF THE RIGHT KNEE WITHOUT CONTRAST TECHNIQUE: Multiplanar, multisequence MR imaging of the knee was performed. No intravenous contrast was administered. COMPARISON:  Radiographs 04/20/2021 FINDINGS: MENISCI Medial meniscus: Diffuse degenerative tearing of the posterior horn. The medial meniscus is partially extruded peripherally from the joint. No definite centrally displaced meniscal fragment. Lateral meniscus: Mild degeneration of the anterior horn without evidence of tear or displaced meniscal fragment. LIGAMENTS Cruciates:  Intact. Collaterals: Intact. There is mild medial bowing of the MCL related to the medial meniscal extrusion. CARTILAGE Patellofemoral: Mild patellofemoral chondral thinning, surface irregularity and osteophyte formation. Medial: Diffuse chondral thinning with prominent peripheral  and central osteophytes. Lateral: Mild chondral thinning with prominent peripheral osteophytes. MISCELLANEOUS Joint:  Small nonspecific joint effusion. Popliteal Fossa: The popliteus muscle and tendon are intact. No significant Baker's cyst. Extensor Mechanism:  Intact. Bones: No acute osseous findings. No cortical destruction or suspicious subchondral edema to suggest osteomyelitis/septic joint. Other: There is a complex subcutaneous fluid collection anterolateral to the proximal tibia, measuring approximately 8.1 cm length and 5.7 x 2.3 cm transverse. This lies superficial to the lateral patellar retinaculum and iliotibial band and demonstrates heterogeneous T1 and T2 hyperintensity. There is ill-defined T2 hyperintensity extending from the medial aspect of this collection into the pretibial subcutaneous tissues, the presumed site of the drainage. There is generalized subcutaneous edema around the knee without additional focal fluid collection. IMPRESSION: 1. Complex subcutaneous fluid collection  anterolaterally with features suggesting a subacute hematoma. Given the drainage and clinical symptoms, this may be secondarily infected. 2. No specific signs of septic arthritis or osteomyelitis. There is a small nonspecific knee joint effusion. 3. Moderate tricompartmental osteoarthritis, most advanced in the medial compartment. 4. Diffuse degenerative tearing of the posterior horn of the medial meniscus. Electronically Signed   By: Richardean Sale M.D.   On: 04/22/2021 07:46   DG Knee Complete 4 Views Right  Result Date: 04/20/2021 CLINICAL DATA:  Pain post fall. EXAM: RIGHT KNEE - COMPLETE 4+ VIEW COMPARISON:  None. FINDINGS: No evidence of fracture, dislocation, or joint effusion. Moderate to severe osteoarthritic changes, worse in the medial compartment. Soft tissues are unremarkable. IMPRESSION: Moderate to severe osteoarthritic changes, worse in the medial compartment. No evidence fracture or dislocation.  Electronically Signed   By: Fidela Salisbury M.D.   On: 04/20/2021 17:49   DG Foot 2 Views Left  Result Date: 04/23/2021 CLINICAL DATA:  Left foot pain, inability to bear weight, no injury EXAM: LEFT FOOT - 2 VIEW COMPARISON:  No prior foot radiographs, correlation is made with ankle radiographs 11/28/2019 FINDINGS: There is no evidence of fracture or dislocation. Bidirectional calcaneal enthesophytes. Degenerative changes at the tarsometatarsal junctions. Vascular calcifications. Significant soft tissue abnormality. IMPRESSION: Negative. Electronically Signed   By: Merilyn Baba M.D.   On: 04/23/2021 11:07   [place] FINE NEEDLE ASP 1ST LESION  Result Date: 04/23/2021 INDICATION: Right knee lateral subcutaneous fluid collection concerning for hematoma or abscess EXAM: ULTRASOUND ASPIRATION RIGHT LATERAL KNEE SUBCUTANEOUS FLUID COLLECTION MEDICATIONS: The patient is currently admitted to the hospital and receiving intravenous antibiotics. The antibiotics were administered within an appropriate time frame prior to the initiation of the procedure. ANESTHESIA/SEDATION: Moderate Sedation Time:  None. The patient was continuously monitored during the procedure by the interventional radiology nurse under my direct supervision. COMPLICATIONS: None immediate. PROCEDURE: Informed written consent was obtained from the patient after a thorough discussion of the procedural risks, benefits and alternatives. All questions were addressed. Maximal Sterile Barrier Technique was utilized including caps, mask, sterile gowns, sterile gloves, sterile drape, hand hygiene and skin antiseptic. A timeout was performed prior to the initiation of the procedure. Previous imaging reviewed. Preliminary ultrasound performed. The subcutaneous complex fluid collection along the lateral knee margin was localized and marked. This was correlated with the MRI. Under sterile conditions and local anesthesia, both a Yueh sheath needle and a 17  gauge needle were advanced throughout the collection for aspiration. Only 10 cc of thick bloody fluid could be aspirated. Findings suggest infected hematoma. The entire collection could not be decompressed by aspiration because of the complexity. Sample sent for culture. Needle removed. IMPRESSION: Successful ultrasound aspiration of the right lateral knee subcutaneous infected hematoma versus abscess. Electronically Signed   By: Jerilynn Mages.  Shick M.D.   On: 04/23/2021 16:12   (Echo, Carotid, EGD, Colonoscopy, ERCP)    Subjective: Patient seen and examined.  Morning rounds her daughter was on the speaker phone.  Patient was without any complaints.  She thinks her knee swelling is much better and was able to move around.  She was eager to go home.   Discharge Exam: Vitals:   04/25/21 1941 04/26/21 0500  BP: 132/76 (!) 140/59  Pulse: 77 76  Resp: 18 20  Temp: 98.5 F (36.9 C) 98.2 F (36.8 C)  SpO2: 100% 100%   Vitals:   04/25/21 0844 04/25/21 1054 04/25/21 1941 04/26/21 0500  BP: 126/63 136/68 132/76 (!) 140/59  Pulse: 79 69 77 76  Resp: '18 20 18 20  '$ Temp:  98.2 F (36.8 C) 98.5 F (36.9 C) 98.2 F (36.8 C)  TempSrc:  Oral Oral Oral  SpO2:  99% 100% 100%  Weight:      Height:        General: Pt is alert, awake, not in acute distress Cardiovascular: RRR, S1/S2 +, no rubs, no gallops Respiratory: CTA bilaterally, no wheezing, no rhonchi Abdominal: Soft, NT, ND, bowel sounds + Extremities:  Chronic venous stasis changes on both legs.  She has 2 pinpoint openings on her right knee with no drainage.  Bilateral knee with equal circumference.    The results of significant diagnostics from this hospitalization (including imaging, microbiology, ancillary and laboratory) are listed below for reference.     Microbiology: Recent Results (from the past 240 hour(s))  Resp Panel by RT-PCR (Flu A&B, Covid)     Status: None   Collection Time: 04/20/21  6:48 PM   Specimen: Nasopharyngeal(NP)  swabs in vial transport medium  Result Value Ref Range Status   SARS Coronavirus 2 by RT PCR NEGATIVE NEGATIVE Final    Comment: (NOTE) SARS-CoV-2 target nucleic acids are NOT DETECTED.  The SARS-CoV-2 RNA is generally detectable in upper respiratory specimens during the acute phase of infection. The lowest concentration of SARS-CoV-2 viral copies this assay can detect is 138 copies/mL. A negative result does not preclude SARS-Cov-2 infection and should not be used as the sole basis for treatment or other patient management decisions. A negative result may occur with  improper specimen collection/handling, submission of specimen other than nasopharyngeal swab, presence of viral mutation(s) within the areas targeted by this assay, and inadequate number of viral copies(<138 copies/mL). A negative result must be combined with clinical observations, patient history, and epidemiological information. The expected result is Negative.  Fact Sheet for Patients:  EntrepreneurPulse.com.au  Fact Sheet for Healthcare Providers:  IncredibleEmployment.be  This test is no t yet approved or cleared by the [place] FDA and  has been authorized for detection and/or diagnosis of SARS-CoV-2 by FDA under an Emergency Use Authorization (EUA). This EUA will remain  in effect (meaning this test can be used) for the duration of the COVID-19 declaration under Section 564(b)(1) of the Act, 21 U.S.C.section 360bbb-3(b)(1), unless the authorization is terminated  or revoked sooner.       Influenza A by PCR NEGATIVE NEGATIVE Final   Influenza B by PCR NEGATIVE NEGATIVE Final    Comment: (NOTE) The Xpert Xpress SARS-CoV-2/FLU/RSV plus assay is intended as an aid in the diagnosis of influenza from Nasopharyngeal swab specimens and should not be used as a sole basis for treatment. Nasal washings and aspirates are unacceptable for Xpert Xpress  SARS-CoV-2/FLU/RSV testing.  Fact Sheet for Patients: EntrepreneurPulse.com.au  Fact Sheet for Healthcare Providers: IncredibleEmployment.be  This test is not yet approved or cleared by the [place] FDA and has been authorized for detection and/or diagnosis of SARS-CoV-2 by FDA under an Emergency Use Authorization (EUA). This EUA will remain in effect (meaning this test can be used) for the duration of the COVID-19 declaration under Section 564(b)(1) of the Act, 21 U.S.C. section 360bbb-3(b)(1), unless the authorization is terminated or revoked.  Performed at [facility], [place] [address] [place], [place] [address]   Culture, blood (routine x 2)     Status: None   Collection Time: 04/20/21  6:48 PM   Specimen: BLOOD  Result Value Ref Range Status  Specimen Description BLOOD LEFT ANTECUBITAL  Final   Special Requests   Final    BOTTLES DRAWN AEROBIC AND ANAEROBIC Blood Culture adequate volume   Culture   Final    NO GROWTH 5 DAYS Performed at [facility], [address], [place], [place] [address]    Report Status 04/25/2021 FINAL  Final  Culture, blood (routine x 2)     Status: None   Collection Time: 04/20/21  6:52 PM   Specimen: BLOOD LEFT WRIST  Result Value Ref Range Status   Specimen Description BLOOD LEFT WRIST  Final   Special Requests   Final    BOTTLES DRAWN AEROBIC AND ANAEROBIC Blood Culture results may not be optimal due to an inadequate volume of blood received in culture bottles   Culture   Final    NO GROWTH 5 DAYS Performed at [facility], [place] [address] [place], [place] [address]    Report Status 04/25/2021 FINAL  Final  Aerobic/Anaerobic Culture w Gram Stain (surgical/deep wound)     Status: None (Preliminary result)   Collection Time: 04/23/21  3:21 PM   Specimen: Wound; Abscess  Result Value Ref Range Status   Specimen Description WOUND  Final   Special Requests  KNEE ABCESS  Final    Gram Stain   Final    ABUNDANT WBC PRESENT,BOTH PMN AND MONONUCLEAR MODERATE GRAM POSITIVE COCCI Performed at [facility], [place] [address] [place], [place] [address]    Culture   Final    ABUNDANT METHICILLIN RESISTANT STAPHYLOCOCCUS AUREUS NO ANAEROBES ISOLATED; CULTURE IN PROGRESS FOR 5 DAYS    Report Status PENDING  Incomplete   Organism ID, Bacteria METHICILLIN RESISTANT STAPHYLOCOCCUS AUREUS  Final      Susceptibility   Methicillin resistant staphylococcus aureus - MIC*    CIPROFLOXACIN >=8 RESISTANT Resistant     ERYTHROMYCIN 0.5 SENSITIVE Sensitive     GENTAMICIN <=0.5 SENSITIVE Sensitive     OXACILLIN >=4 RESISTANT Resistant     TETRACYCLINE <=1 SENSITIVE Sensitive     VANCOMYCIN 1 SENSITIVE Sensitive     TRIMETH/SULFA <=10 SENSITIVE Sensitive     CLINDAMYCIN <=0.25 SENSITIVE Sensitive     RIFAMPIN <=0.5 SENSITIVE Sensitive     Inducible Clindamycin NEGATIVE Sensitive     * ABUNDANT METHICILLIN RESISTANT STAPHYLOCOCCUS AUREUS     Labs: BNP (last 3 results) Recent Labs    01/29/21 0143 02/15/21 1558 04/20/21 1631  BNP 102.1* 142.0* 123XX123*   Basic Metabolic Panel: Recent Labs  Lab 04/21/21 0600 04/22/21 0308 04/23/21 0145 04/24/21 0737 04/25/21 0158 04/26/21 0324  NA 138 139 139 141 137 135  K 4.4 3.6 3.2* 4.1 4.0 3.7  CL 106 103 101 99 97* 95*  CO2 '24 26 28 '$ 33* 29 28  GLUCOSE 176* 106* 136* 115* 120* 113*  BUN 21 25* 25* 27* 33* 42*  CREATININE 1.27* 1.47* 1.42* 1.44* 1.43* 1.81*  CALCIUM 8.5* 8.5* 8.6* 8.8* 9.1 9.4  MG 2.2  --   --   --   --   --    Liver Function Tests: Recent Labs  Lab 04/22/21 0308 04/23/21 0145 04/24/21 0737  AST 10* 12* 16  ALT '12 10 15  '$ ALKPHOS 81 79 86  BILITOT 1.3* 1.3* 1.1  PROT 7.2 7.6 7.5  ALBUMIN 2.5* 2.5* 2.3*   No results for input(s): LIPASE, AMYLASE in the last 168 hours. No results for input(s): AMMONIA in the last 168 hours. CBC: Recent Labs  Lab 04/20/21 1631 04/21/21 0505 04/22/21 0308  04/23/21 0145 04/24/21 0737 04/25/21 0158 04/26/21 0324  WBC 8.5   < > 9.0 10.2 8.4 9.1 9.8  NEUTROABS 7.0  --   --   --   --   --   --   HGB 5.7*   < > 7.1* 7.2* 7.4* 7.6* 7.7*  HCT 22.5*   < > 24.5* 25.8* 25.9* 26.6* 26.7*  MCV 82.4   < > 82.2 81.4 82.5 81.1 80.9  PLT 357   < > 305 343 335 353 353   < > = values in this interval not displayed.   Cardiac Enzymes: No results for input(s): CKTOTAL, CKMB, CKMBINDEX, TROPONINI in the last 168 hours. BNP: Invalid input(s): POCBNP CBG: Recent Labs  Lab 04/25/21 0609 04/25/21 1054 04/25/21 1628 04/25/21 2121 04/26/21 0530  GLUCAP 123* 127* 123* 116* 123*   D-Dimer No results for input(s): DDIMER in the last 72 hours. Hgb A1c No results for input(s): HGBA1C in the last 72 hours. Lipid Profile No results for input(s): CHOL, HDL, LDLCALC, TRIG, CHOLHDL, LDLDIRECT in the last 72 hours. Thyroid function studies No results for input(s): TSH, T4TOTAL, T3FREE, THYROIDAB in the last 72 hours.  Invalid input(s): FREET3 Anemia work up No results for input(s): VITAMINB12, FOLATE, FERRITIN, TIBC, IRON, RETICCTPCT in the last 72 hours. Urinalysis No results found for: COLORURINE, APPEARANCEUR, [place], [place], [place], [place], [place], [place], PROTEINUR, UROBILINOGEN, NITRITE, LEUKOCYTESUR Sepsis Labs Invalid input(s): PROCALCITONIN,  WBC,  LACTICIDVEN Microbiology Recent Results (from the past 240 hour(s))  Resp Panel by RT-PCR (Flu A&B, Covid)     Status: None   Collection Time: 04/20/21  6:48 PM   Specimen: Nasopharyngeal(NP) swabs in vial transport medium  Result Value Ref Range Status   SARS Coronavirus 2 by RT PCR NEGATIVE NEGATIVE Final    Comment: (NOTE) SARS-CoV-2 target nucleic acids are NOT DETECTED.  The SARS-CoV-2 RNA is generally detectable in upper respiratory specimens during the acute phase of infection. The lowest concentration of SARS-CoV-2 viral copies this assay can detect is 138 copies/mL. A negative  result does not preclude SARS-Cov-2 infection and should not be used as the sole basis for treatment or other patient management decisions. A negative result may occur with  improper specimen collection/handling, submission of specimen other than nasopharyngeal swab, presence of viral mutation(s) within the areas targeted by this assay, and inadequate number of viral copies(<138 copies/mL). A negative result must be combined with clinical observations, patient history, and epidemiological information. The expected result is Negative.  Fact Sheet for Patients:  EntrepreneurPulse.com.au  Fact Sheet for Healthcare Providers:  IncredibleEmployment.be  This test is no t yet approved or cleared by the [place] FDA and  has been authorized for detection and/or diagnosis of SARS-CoV-2 by FDA under an Emergency Use Authorization (EUA). This EUA will remain  in effect (meaning this test can be used) for the duration of the COVID-19 declaration under Section 564(b)(1) of the Act, 21 U.S.C.section 360bbb-3(b)(1), unless the authorization is terminated  or revoked sooner.       Influenza A by PCR NEGATIVE NEGATIVE Final   Influenza B by PCR NEGATIVE NEGATIVE Final    Comment: (NOTE) The Xpert Xpress SARS-CoV-2/FLU/RSV plus assay is intended as an aid in the diagnosis of influenza from Nasopharyngeal swab specimens and should not be used as a sole basis for treatment. Nasal washings and aspirates are unacceptable for Xpert Xpress SARS-CoV-2/FLU/RSV testing.  Fact Sheet for Patients: EntrepreneurPulse.com.au  Fact Sheet  for Healthcare Providers: IncredibleEmployment.be  This test is not yet approved or cleared by the [place] and has been authorized for detection and/or diagnosis of SARS-CoV-2 by FDA under an Emergency Use Authorization (EUA). This EUA will remain in effect (meaning this test can be used)  for the duration of the COVID-19 declaration under Section 564(b)(1) of the Act, 21 U.S.C. section 360bbb-3(b)(1), unless the authorization is terminated or revoked.  Performed at [facility], [place] [address] [place], [place] [address]   Culture, blood (routine x 2)     Status: None   Collection Time: 04/20/21  6:48 PM   Specimen: BLOOD  Result Value Ref Range Status   Specimen Description BLOOD LEFT ANTECUBITAL  Final   Special Requests   Final    BOTTLES DRAWN AEROBIC AND ANAEROBIC Blood Culture adequate volume   Culture   Final    NO GROWTH 5 DAYS Performed at [facility], [place] [address], [place], [place] [address]    Report Status 04/25/2021 FINAL  Final  Culture, blood (routine x 2)     Status: None   Collection Time: 04/20/21  6:52 PM   Specimen: BLOOD LEFT WRIST  Result Value Ref Range Status   Specimen Description BLOOD LEFT WRIST  Final   Special Requests   Final    BOTTLES DRAWN AEROBIC AND ANAEROBIC Blood Culture results may not be optimal due to an inadequate volume of blood received in culture bottles   Culture   Final    NO GROWTH 5 DAYS Performed at [facility], [place] [address], [place], [place] [address]    Report Status 04/25/2021 FINAL  Final  Aerobic/Anaerobic Culture w Gram Stain (surgical/deep wound)     Status: None (Preliminary result)   Collection Time: 04/23/21  3:21 PM   Specimen: Wound; Abscess  Result Value Ref Range Status   Specimen Description WOUND  Final   Special Requests  KNEE ABCESS  Final   Gram Stain   Final    ABUNDANT WBC PRESENT,BOTH PMN AND MONONUCLEAR MODERATE GRAM POSITIVE COCCI Performed at [facility], [place] [address] [place], [place] [address]    Culture   Final    ABUNDANT METHICILLIN RESISTANT STAPHYLOCOCCUS AUREUS NO ANAEROBES ISOLATED; CULTURE IN PROGRESS FOR 5 DAYS    Report Status PENDING  Incomplete   Organism ID, Bacteria METHICILLIN RESISTANT STAPHYLOCOCCUS AUREUS  Final       Susceptibility   Methicillin resistant staphylococcus aureus - MIC*    CIPROFLOXACIN >=8 RESISTANT Resistant     ERYTHROMYCIN 0.5 SENSITIVE Sensitive     GENTAMICIN <=0.5 SENSITIVE Sensitive     OXACILLIN >=4 RESISTANT Resistant     TETRACYCLINE <=1 SENSITIVE Sensitive     VANCOMYCIN 1 SENSITIVE Sensitive     TRIMETH/SULFA <=10 SENSITIVE Sensitive     CLINDAMYCIN <=0.25 SENSITIVE Sensitive     RIFAMPIN <=0.5 SENSITIVE Sensitive     Inducible Clindamycin NEGATIVE Sensitive     * ABUNDANT METHICILLIN RESISTANT STAPHYLOCOCCUS AUREUS     Time coordinating discharge: 35 minutes  SIGNED:   Barb Merino, MD  [facility] 04/26/2021, 2:25 PM

## 2021-04-26 NOTE — Plan of Care (Signed)

## 2021-04-26 NOTE — Progress Notes (Signed)
     Subjective:  Patient sitting up in chair this morning.  Reports right knee pain is well controlled.  She denies any recurrence of the left foot pain.  Denies fevers or chills.  No new issues.   Objective:   VITALS:   Vitals:   04/25/21 0844 04/25/21 1054 04/25/21 1941 04/26/21 0500  BP: 126/63 136/68 132/76 (!) 140/59  Pulse: 79 69 77 76  Resp: $Remo'18 20 18 20  'kbPNs$ Temp:  98.2 F (36.8 C) 98.5 F (36.9 C) 98.2 F (36.8 C)  TempSrc:  Oral Oral Oral  SpO2:  99% 100% 100%  Weight:      Height:        RLE: Area over the anterior knee with minimal erythema, small draining wound over the anterior lateral aspect of the knee with mild purulent drainage.  Minimal discomfort with knee range of motion.  Lower leg with chronic lymphedema decreased from prior.  No new open wounds.   Lab Results  Component Value Date   WBC 9.8 04/26/2021   HGB 7.7 (L) 04/26/2021   HCT 26.7 (L) 04/26/2021   MCV 80.9 04/26/2021   PLT 353 04/26/2021   BMET    Component Value Date/Time   NA 135 04/26/2021 0324   NA 139 10/31/2020 1452   K 3.7 04/26/2021 0324   CL 95 (L) 04/26/2021 0324   CO2 28 04/26/2021 0324   GLUCOSE 113 (H) 04/26/2021 0324   BUN 42 (H) 04/26/2021 0324   BUN 42 (H) 10/31/2020 1452   CREATININE 1.81 (H) 04/26/2021 0324   CALCIUM 9.4 04/26/2021 0324   EGFR 34 (L) 10/31/2020 1452   GFRNONAA 29 (L) 04/26/2021 0324   MRI of the right knee was reviewed, no intra-articular fluid collections concerning for arthritis. Moderate sized fluid collection noted at the anterior lateral aspect of the knee.  Assessment/Plan:    Right leg fluid collection over the anterior aspect of the knee concerning for infected hematoma.  Currently patient is afebrile, normal white count, and draining wound likely communicating with the fluid collection.  Fluid collection status post IR aspiration on 10/11  Principal Problem:   Symptomatic anemia Active Problems:   CHF (congestive heart failure) (HCC)    PTSD (post-traumatic stress disorder)   PAF (paroxysmal atrial fibrillation) (HCC)   Hypothyroidism   DMII (diabetes mellitus, type 2) (HCC)   Cellulitis   Septic arthritis (HCC)   Pressure injury of skin   Left foot -x-rays negative for fracture, symptoms consistent with metatarsalgia which appears improved today.  R knee- Anterolateral superficial drainage wound and deep hematoma, no concern for septic knee arthritis at this time.  Post bedside aspiration by IR on 10/11.  Doing well.  Clinically wound appears benign.  Afebrile normal white count appears to be responding well to antibiotics, wound care, and the IR aspiration.  Reasonable to hold off on surgical debridement unless concerns for clinical worsening.  Merita Hawks A Maryama Kuriakose 04/26/2021, 8:31 AM   Charlies Constable, MD Cell [phone]

## 2021-04-26 NOTE — Progress Notes (Signed)
Physical Therapy Treatment Patient Details Name: Hannah [name] MRN: [id number] DOB: 11-12-1945 Today's Date: 04/26/2021   History of Present Illness 75 yo female with onset of generalized weakness and symptomatic anemia was admitted 10/8 and dx with septic R knee cellulitis.  Previously had fallen, with R knee having developed effusion.  Pt has been transfused, having hgb of 4.7. S/p R knee abscess aspiration 10/11. PMHx:  CHF, a-fib, celluliitis, venous insufficiency, DM, hypothyroidism, PTSD, skin injury from pressure,    PT Comments    Pt is demonstrating significant STM deficits with no carryover, even a few seconds later, of repeated multi-modal education on how to lock the brakes on the rollator. This places her at risk for falls if she were to not lock the brakes prior to sitting in it. Educated pt on this concern and recommendations to have someone supervise/assist her with all standing mobility at home to maximize her safety, pt verbalized understanding. Other than unsafe habits, pt displayed improved balance and independence with mobility, performing transfers with supervision and ambulating short household distances with min guard assist using the rollator. Pt would benefit from short-term rehab at a SNF prior to returning home to maximize her safety and independence with all functional mobility, but pt is declining a SNF thus recommending HHPT. Will continue to follow acutely.     Recommendations for follow up therapy are one component of a multi-disciplinary discharge planning process, led by the attending physician.  Recommendations may be updated based on patient status, additional functional criteria and insurance authorization.  Follow Up Recommendations  SNF;Supervision for mobility/OOB (pt declining SNF, thus HHPT; needs supervision/assistance with all standing mobility for safety)     Equipment Recommendations  None recommended by PT    Recommendations for Other  Services       Precautions / Restrictions Precautions Precautions: Fall Precaution Comments: fearful of falling, previously agitated but not today Restrictions Weight Bearing Restrictions: No     Mobility  Bed Mobility               General bed mobility comments: Pt sitting up in recliner upon arrival.    Transfers Overall transfer level: Needs assistance Equipment used: 4-wheeled walker Transfers: Sit to/from Stand Sit to Stand: Supervision         General transfer comment: Pt with no carryover of repeated max education and cues to lock brakes prior to standing or sitting and to push up from current sitting surface to stand or reach back for sitting surface to sit for safety purposes. However, pt pulling up on unlocked rollator with no LOB, supervision for safety.  Ambulation/Gait Ambulation/Gait assistance: Min guard Gait Distance (Feet): 80 Feet (x2 bouts of ~20 ft > ~80 ft) Assistive device: 4-wheeled walker Gait Pattern/deviations: Step-through pattern;Decreased stride length;Trunk flexed Gait velocity: reduced Gait velocity interpretation: <1.31 ft/sec, indicative of household ambulator General Gait Details: Pt with short bil steps and slow gait, fatiguing quickly. Pt keeps rollator proximal to her, but on turns needs reminders to keep it with her. No LOB, min guard for safety.   Stairs             Wheelchair Mobility    Modified Rankin (Stroke Patients Only)       Balance Overall balance assessment: Needs assistance;History of Falls Sitting-balance support: Feet supported Sitting balance-Leahy Scale: Good     Standing balance support: No upper extremity supported;Bilateral upper extremity supported;Single extremity supported Standing balance-Leahy Scale: Fair Standing balance comment: Able to stand briefly statically  without UE support but needs at least 1 UE support for longer durations or 2 UE support for mobility.                             Cognition Arousal/Alertness: Awake/alert Behavior During Therapy: Anxious Overall Cognitive Status: Impaired/Different from baseline Area of Impairment: Memory;Safety/judgement;Problem solving                     Memory: Decreased short-term memory   Safety/Judgement: Decreased awareness of safety   Problem Solving: Slow processing;Difficulty sequencing;Requires verbal cues;Requires tactile cues General Comments: Pt with very limited STM. Educated pt repeatedly (at least 5 separate times with multiple reps each bout) on how to lock rollator brakes. Even performed hand-over-hand, had pt verbally state what she was doing while doing it, and demonstrate to PT once PT's hand was removed. However, as soon as hand was moved off the brake a moment later to ask pt to apply the brakes before standing, she began to squeeze the brake again and unable to probelm-solve how to lock it despite prior education or max cues. Pt reporting difficulty with memory and concentration these past few days. Possibly due to some anxiety in regards to mobility, fear of falling, or going home.      Exercises      General Comments General comments (skin integrity, edema, etc.): Educated pt to have someone with her anytime she is standing and on stairs to ensure pt safety due to her memory and balance deficits, pt verbalized understanding      Pertinent Vitals/Pain Pain Assessment: Faces Faces Pain Scale: Hurts a little bit Pain Location: R knee, L foot Pain Descriptors / Indicators: Discomfort;Grimacing Pain Intervention(s): Limited activity within patient's tolerance;Monitored during session;Repositioned    Home Living                      Prior Function            PT Goals (current goals can now be found in the care plan section) Acute Rehab PT Goals Patient Stated Goal: to go home today PT Goal Formulation: With patient Time For Goal Achievement: 05/06/21 Potential to  Achieve Goals: Good Progress towards PT goals: Progressing toward goals    Frequency    Min 3X/week      PT Plan Current plan remains appropriate    Co-evaluation              AM-PAC PT "6 Clicks" Mobility   Outcome Measure  Help needed turning from your back to your side while in a flat bed without using bedrails?: A Little Help needed moving from lying on your back to sitting on the side of a flat bed without using bedrails?: A Little Help needed moving to and from a bed to a chair (including a wheelchair)?: A Little Help needed standing up from a chair using your arms (e.g., wheelchair or bedside chair)?: A Little Help needed to walk in hospital room?: A Little Help needed climbing 3-5 steps with a railing? : A Little 6 Click Score: 18    End of Session Equipment Utilized During Treatment: Gait belt Activity Tolerance: Patient tolerated treatment well Patient left: in chair;with call bell/phone within reach;with chair alarm set Nurse Communication: Mobility status;Other (comment) (STM deficits) PT Visit Diagnosis: Unsteadiness on feet (R26.81);Muscle weakness (generalized) (M62.81);Pain;Other abnormalities of gait and mobility (R26.89);Difficulty in walking, not elsewhere classified (R26.2) Pain -  Right/Left:  (bil) Pain - part of body: Knee;Ankle and joints of foot     Time: [id number] PT Time Calculation (min) (ACUTE ONLY): 41 min  Charges:  $Gait Training: 23-37 mins $Therapeutic Activity: 8-22 mins                     Moishe Spice, PT, DPT Acute Rehabilitation Services  Pager: [phone] Office: [place] 04/26/2021, 10:41 AM

## 2021-04-28 DIAGNOSIS — D631 Anemia in chronic kidney disease: Secondary | ICD-10-CM | POA: Diagnosis not present

## 2021-04-28 DIAGNOSIS — I509 Heart failure, unspecified: Secondary | ICD-10-CM | POA: Diagnosis not present

## 2021-04-28 DIAGNOSIS — L02415 Cutaneous abscess of right lower limb: Secondary | ICD-10-CM | POA: Diagnosis not present

## 2021-04-28 DIAGNOSIS — M47816 Spondylosis without myelopathy or radiculopathy, lumbar region: Secondary | ICD-10-CM | POA: Diagnosis not present

## 2021-04-28 DIAGNOSIS — E669 Obesity, unspecified: Secondary | ICD-10-CM | POA: Diagnosis not present

## 2021-04-28 DIAGNOSIS — E1122 Type 2 diabetes mellitus with diabetic chronic kidney disease: Secondary | ICD-10-CM | POA: Diagnosis not present

## 2021-04-28 DIAGNOSIS — Z7901 Long term (current) use of anticoagulants: Secondary | ICD-10-CM | POA: Diagnosis not present

## 2021-04-28 DIAGNOSIS — M1711 Unilateral primary osteoarthritis, right knee: Secondary | ICD-10-CM | POA: Diagnosis not present

## 2021-04-28 DIAGNOSIS — L03115 Cellulitis of right lower limb: Secondary | ICD-10-CM | POA: Diagnosis not present

## 2021-04-28 DIAGNOSIS — I480 Paroxysmal atrial fibrillation: Secondary | ICD-10-CM | POA: Diagnosis not present

## 2021-04-28 DIAGNOSIS — H409 Unspecified glaucoma: Secondary | ICD-10-CM | POA: Diagnosis not present

## 2021-04-28 DIAGNOSIS — I130 Hypertensive heart and chronic kidney disease with heart failure and stage 1 through stage 4 chronic kidney disease, or unspecified chronic kidney disease: Secondary | ICD-10-CM | POA: Diagnosis not present

## 2021-04-28 DIAGNOSIS — Z792 Long term (current) use of antibiotics: Secondary | ICD-10-CM | POA: Diagnosis not present

## 2021-04-28 DIAGNOSIS — Z6841 Body Mass Index (BMI) 40.0 and over, adult: Secondary | ICD-10-CM | POA: Diagnosis not present

## 2021-04-28 DIAGNOSIS — N1832 Chronic kidney disease, stage 3b: Secondary | ICD-10-CM | POA: Diagnosis not present

## 2021-04-28 DIAGNOSIS — M25061 Hemarthrosis, right knee: Secondary | ICD-10-CM | POA: Diagnosis not present

## 2021-04-28 DIAGNOSIS — Z9181 History of falling: Secondary | ICD-10-CM | POA: Diagnosis not present

## 2021-04-28 DIAGNOSIS — I872 Venous insufficiency (chronic) (peripheral): Secondary | ICD-10-CM | POA: Diagnosis not present

## 2021-04-28 DIAGNOSIS — K219 Gastro-esophageal reflux disease without esophagitis: Secondary | ICD-10-CM | POA: Diagnosis not present

## 2021-04-28 DIAGNOSIS — F4310 Post-traumatic stress disorder, unspecified: Secondary | ICD-10-CM | POA: Diagnosis not present

## 2021-04-28 DIAGNOSIS — E039 Hypothyroidism, unspecified: Secondary | ICD-10-CM | POA: Diagnosis not present

## 2021-04-28 DIAGNOSIS — B9562 Methicillin resistant Staphylococcus aureus infection as the cause of diseases classified elsewhere: Secondary | ICD-10-CM | POA: Diagnosis not present

## 2021-04-28 DIAGNOSIS — E1151 Type 2 diabetes mellitus with diabetic peripheral angiopathy without gangrene: Secondary | ICD-10-CM | POA: Diagnosis not present

## 2021-04-28 DIAGNOSIS — Z7984 Long term (current) use of oral hypoglycemic drugs: Secondary | ICD-10-CM | POA: Diagnosis not present

## 2021-04-28 LAB — AEROBIC/ANAEROBIC CULTURE W GRAM STAIN (SURGICAL/DEEP WOUND)

## 2021-05-01 ENCOUNTER — Ambulatory Visit: Payer: PPO | Admitting: Physician Assistant

## 2021-05-01 DIAGNOSIS — S81011A Laceration without foreign body, right knee, initial encounter: Secondary | ICD-10-CM | POA: Diagnosis not present

## 2021-05-03 ENCOUNTER — Other Ambulatory Visit: Payer: Self-pay

## 2021-05-03 ENCOUNTER — Encounter: Payer: Self-pay | Admitting: Physician Assistant

## 2021-05-03 ENCOUNTER — Ambulatory Visit (INDEPENDENT_AMBULATORY_CARE_PROVIDER_SITE_OTHER): Payer: PPO | Admitting: Physician Assistant

## 2021-05-03 VITALS — BP 112/68 | HR 91 | Ht 66.0 in

## 2021-05-03 DIAGNOSIS — I5032 Chronic diastolic (congestive) heart failure: Secondary | ICD-10-CM

## 2021-05-03 DIAGNOSIS — Z79899 Other long term (current) drug therapy: Secondary | ICD-10-CM | POA: Diagnosis not present

## 2021-05-03 DIAGNOSIS — I4819 Other persistent atrial fibrillation: Secondary | ICD-10-CM | POA: Diagnosis not present

## 2021-05-03 DIAGNOSIS — I10 Essential (primary) hypertension: Secondary | ICD-10-CM

## 2021-05-03 DIAGNOSIS — Z5181 Encounter for therapeutic drug level monitoring: Secondary | ICD-10-CM

## 2021-05-03 NOTE — Patient Instructions (Signed)
Medication Instructions:   FOR THREE DAYS ONLY :  TAKE TORSEMIDE 80 MG TWICE A DAY   TAKE  POTASSIUM 40 MEQ IN AM AND 20 MEQ IN NIGHT    AFTER THREE DAYS:   TAKE TORSEMIDE 60 MG ONCE A DAY   TAKE POTASSIUM 40 MEQ ONCE A DAY   *If you need a refill on your cardiac medications before your next appointment, please call your pharmacy*   Lab Work: BMET CBC AND MAG TODAY    If you have labs (blood work) drawn today and your tests are completely normal, you will receive your results only by: MyChart Message (if you have MyChart) OR A paper copy in the mail If you have any lab test that is abnormal or we need to change your treatment, we will call you to review the results.   Testing/Procedures: NONE ORDERED  TODAY     Follow-Up: At [facility], you and your health needs are our priority.  As part of our continuing mission to provide you with exceptional heart care, we have created designated Provider Care Teams.  These Care Teams include your primary Cardiologist (physician) and Advanced Practice Providers (APPs -  Physician Assistants and Nurse Practitioners) who all work together to provide you with the care you need, when you need it.  We recommend signing up for the patient portal called "MyChart".  Sign up information is provided on this After Visit Summary.  MyChart is used to connect with patients for Virtual Visits (Telemedicine).  Patients are able to view lab/test results, encounter notes, upcoming appointments, etc.  Non-urgent messages can be sent to your provider as well.   To learn more about what you can do with MyChart, go to [email or web address].    Your next appointment:    2-3 weeks with Dr Aundra Dubin or [facility]   1 -2  month(s)    The format for your next appointment:   In Person  Provider:   Allegra Lai, MD   Other Instructions

## 2021-05-03 NOTE — Progress Notes (Signed)
Cardiology Office Note Date:  05/03/2021  Patient ID:  Hannah [name], DOB [birth year], MRN [id number] PCP:  Lujean Amel, MD  Cardiologist:  Dr. Aundra Dubin Electrophysiologist: Dr. Curt Bears    Chief Complaint: hospital follow up, tikosyn dosing  History of Present Illness: Hannah [name] is a 75 y.o. female with history of chronic CHF (diastolic), AFib, DM, HTN, hypothyroidism, OSA w/CPAP, HLD, obesity, CKD (III), RBBB  She has had difficulty with volume and follows with HF team  She last saw Dr. Curt Bears at the time of her Tikosyn initiation hospital stay, she saw the Afib clinic as usual post discharge. AT that time she felt like she had some AFib on Tikosyn, she was in SR at the time of her visit but did not believe it despite review and discussion of her EKG. She was volume OL and planned to pulse her diuretics in d/w HF team.  Looks like she canceled her 1 mo appt with Dr. Curt Bears, no showed to her last HF appt.  She was hospitalized 10/8-14/22 after a fall and knee injury hemarthrosis R knee with associated cellulitis, MRSA, no intra-articular infection by IR and CT, d/c on doxy Symptomatic acute/chronic anemia  hgb 5.6 > 2u PRBC, felt acute on chronic, no signs of active bleeding outside of knee hematoma, eliquis continued CKD with worsened Creat requiring reduction in Tikosyn dose, in d/w Dr. Oneita Hurt.       Planned for EP follow up and repeat labs for final dosing recommendations  10/14 K+ 3.7 Creat 1.81 (baseline 1.2-1.4)   TODAY She is accompanied by her daughter They both are quite unhappy in general with her over all health care over the years. She remains in quite a bit of pain with her knee but has been unable to get any help with pain management since leaving the hospital. She sees her PMD next week/Wed. For her knee. She reports the pain remains unchanged but the redness is improving, she remains on the antibiotioc  She is surprised to hear that she is  in normal rhythm today given the terrible hospital experience and pain that she has been in She has no CP or palpitations but remains with SOB She is swollen, and her daughter says she thinks she has advanced heart failure that no one really is paying to much attention to. She has no rest SOB, says the mask makes her breathing terrible and so much worse. But even without it gets SOB walking to the kitchen.  I am unable to get a clear understanding of what her "good" or baseline status is.  They are very unhappy that she has been anemic for years and no one mentioned it to her before.  No near syncope or syncope No increasing swelling to her knee, no bleeding or signs of bleeding otherwise either    Device information MDT ILR implanted 03/06/2016 Pt reports known to be EOS   Afib Hx Diagnosed remotely back when living in [place] PVI ablation 05/04/20  AAD hx Amiodarone stopped Dec 2021 with failure to maintain SR Tikosyn started July 2022   Past Medical History:  Diagnosis Date   Atrial fibrillation ([place])    Diabetes mellitus without complication ([place])    Glaucoma    BILATERAL   Hypertension    Thyroid disease    Volume overload 11/2019    Past Surgical History:  Procedure Laterality Date   APPENDECTOMY     ATRIAL FIBRILLATION ABLATION N/A 05/04/2020   Procedure: ATRIAL FIBRILLATION ABLATION;  Surgeon:  Constance Haw, MD;  Location: [place] [facility];  Service: Cardiovascular;  Laterality: N/A;   CARDIOVERSION  11/24/2019   CARDIOVERSION N/A 11/24/2019   Procedure: CARDIOVERSION;  Surgeon: Larey Dresser, MD;  Location: [facility] ENDOSCOPY;  Service: Cardiovascular;  Laterality: N/A;   CARDIOVERSION N/A 11/29/2019   Procedure: CARDIOVERSION;  Surgeon: Larey Dresser, MD;  Location: [facility] ENDOSCOPY;  Service: Cardiovascular;  Laterality: N/A;   CARDIOVERSION N/A 06/19/2020   Procedure: CARDIOVERSION;  Surgeon: Dorothy Spark, MD;  Location: [facility] ENDOSCOPY;  Service:  Cardiovascular;  Laterality: N/A;   CHOLECYSTECTOMY     HERNIA REPAIR     UMBILICAL   LASIK     BILATERAL   TEE WITHOUT CARDIOVERSION N/A 11/24/2019   Procedure: TRANSESOPHAGEAL ECHOCARDIOGRAM (TEE);  Surgeon: Larey Dresser, MD;  Location: [facility] ENDOSCOPY;  Service: Cardiovascular;  Laterality: N/A;   TEE WITHOUT CARDIOVERSION N/A 05/03/2020   Procedure: TRANSESOPHAGEAL ECHOCARDIOGRAM (TEE);  Surgeon: Donato Heinz, MD;  Location[facility] ENDOSCOPY;  Service: Cardiovascular;  Laterality: N/A;    Current Outpatient Medications  Medication Sig Dispense Refill   acetaminophen (TYLENOL) 500 MG tablet Take 500 mg by mouth every 6 (six) hours as needed for moderate pain.     apixaban (ELIQUIS) 5 MG TABS tablet Take 1 tablet (5 mg total) by mouth 2 (two) times daily. 180 tablet 1   busPIRone (BUSPAR) 15 MG tablet TAKE 1 TABLET THREE TIMES A DAY 270 tablet 1   diclofenac Sodium (VOLTAREN) 1 % GEL Apply 2 g topically 4 (four) times daily. (Patient taking differently: Apply 2 g topically 4 (four) times daily as needed (pain).) 10 g 0   dofetilide (TIKOSYN) 250 MCG capsule Take 1 capsule (250 mcg total) by mouth 2 (two) times daily. 60 capsule 0   doxycycline (VIBRA-TABS) 100 MG tablet Take 1 tablet (100 mg total) by mouth 2 (two) times daily for 14 days. 28 tablet 0   DULoxetine (CYMBALTA) 30 MG capsule Take 3 capsules (90 mg total) by mouth daily. 270 capsule 1   empagliflozin (JARDIANCE) 10 MG TABS tablet Take 1 tablet (10 mg total) by mouth daily before breakfast. 90 tablet 3   esomeprazole (NEXIUM) 40 MG capsule Take 40 mg by mouth as needed (indigestion/heartburn).      ferrous sulfate 325 (65 FE) MG tablet Take 1 tablet (325 mg total) by mouth 2 (two) times daily. 60 tablet 0   levocetirizine (XYZAL) 5 MG tablet Take 5 mg by mouth at bedtime.      levothyroxine (SYNTHROID) 125 MCG tablet Take 1 tablet (125 mcg total) by mouth daily. 30 tablet 0   lidocaine (LIDODERM) 5 % Place 1 patch onto  the skin daily. Remove & Discard patch within 12 hours or as directed by MD 30 patch 0   Misc. Devices (BARIATRIC ROLLATOR) MISC For home use 1 each 0   montelukast (SINGULAIR) 10 MG tablet Take 10 mg by mouth at bedtime.      Multiple Vitamins-Minerals (PRESERVISION AREDS) CAPS Take 1 capsule by mouth in the morning and at bedtime.     NYAMYC powder Apply 1 application topically as needed (rash under breasts).      potassium chloride SA (KLOR-CON) 20 MEQ tablet Take 20-40 mEq by mouth 2 (two) times daily. She is taking 40 mEq in AM/20 mEq in PM     rosuvastatin (CRESTOR) 5 MG tablet Take 5 mg by mouth daily.     spironolactone (ALDACTONE) 25 MG tablet Take 0.5 tablets (  12.5 mg total) by mouth daily.     torsemide (DEMADEX) 20 MG tablet TAKE THREE TABLETS BY MOUTH TWICE A DAY 180 tablet 3   triamcinolone cream (KENALOG) 0.1 % Apply 1 application topically daily as needed (rash).     zolpidem (AMBIEN) 10 MG tablet Take 10 mg by mouth at bedtime.     No current facility-administered medications for this visit.    Allergies:   Azithromycin   Social History:  The patient  reports that she has never smoked. She has never used smokeless tobacco. She reports current alcohol use of about 1.0 standard drink per week. She reports that she does not use drugs.   Family History:  The patient's family history includes Colon cancer in her father; Heart attack in her brother and paternal grandfather; Heart disease in her brother; Lung cancer in her maternal grandfather and mother.  ROS:  Please see the history of present illness.    All other systems are reviewed and otherwise negative.   PHYSICAL EXAM:  VS:  BP 112/68   Pulse 91   Ht 5\' 6"  (1.676 m)   SpO2 97%   BMI 46.15 kg/m  BMI: Body mass index is 46.15 kg/m. Well nourished, well developed, in no acute distress HEENT: normocephalic, atraumatic Neck: no JVD, carotid bruits or masses Cardiac:  RRR; no significant murmurs, no rubs, or  gallops Lungs:  CTA b/l, no wheezing, rhonchi or rales Abd: soft, nontender MS: no deformity or atrophy Ext: brawny/pitting type edema noted b/l LE, she has fairly tight pants and can not see above midshin well, R>L erythema, likely component of chronic skin changes Skin: warm and dry, no rash Neuro:  No gross deficits appreciated Psych: euthymic mood, full affect   EKG:  Done today and reviewed by myself shows  SR 91bpm, RBBB (old), 1st degree AVblock, 235ms, QTc 457ms    05/04/2020: EPS/Ablation CONCLUSIONS: 1. Atrial fibrillation upon presentation.   2. Successful electrical isolation and anatomical encircling of all four pulmonary veins with radiofrequency current.  A WACA approach was used 3. Additional left atrial ablation was performed with a standard box lesion created along the posterior wall of the left atrium 4. Atrial fibrillation successfully cardioverted to sinus rhythm. 5. Ablation of typical atrial flutter 5. No early apparent complications.    05/03/2020: TEE IMPRESSIONS   1. Left ventricular ejection fraction, by estimation, is 60 to 65%. The  left ventricle has normal function.   2. Right ventricular systolic function is normal. The right ventricular  size is mildly enlarged. There is moderately elevated pulmonary artery  systolic pressure.   3. Left atrial size was moderately dilated. No left atrial/left atrial  appendage thrombus was detected.   4. Right atrial size was mildly dilated.   5. The mitral valve is abnormal. Moderate mitral valve regurgitation. ERO  0.2 cm^2, RV 42 cc.   6. Tricuspid valve regurgitation is moderate.   7. The aortic valve is tricuspid. Aortic valve regurgitation is not  visualized. No aortic stenosis is present.   Recent Labs: 04/20/2021: B Natriuretic Peptide 214.3 04/21/2021: Magnesium 2.2 04/23/2021: TSH 5.239 04/24/2021: ALT 15 04/26/2021: BUN 42; Creatinine, Ser 1.81; Hemoglobin 7.7; Platelets 353; Potassium 3.7;  Sodium 135  No results found for requested labs within last 8760 hours.   Estimated Creatinine Clearance: 37.7 mL/min (A) (by C-G formula based on SCr of 1.81 mg/dL (H)).   Wt Readings from Last 3 Encounters:  04/24/21 285 lb 15 oz (129.7 kg)  03/13/21 (!) 308 lb 10.3 oz (140 kg)  02/06/21 (!) 307 lb 9.6 oz (139.5 kg)     Other studies reviewed: Additional studies/records reviewed today include: summarized above  ASSESSMENT AND PLAN:  Persistent Afib CHA2DS2Vasc is 4, on Eliquis, appropriately dosde Symptoms unreliable for burden Tikosyn  Update her labs today, hopefully her Creat is better  Daughter mentions that she does not want her Mom's HR to be allowed to go onto the 50's, discussed that HRs in the 50's would be acceptable clinically as long as intervals, rhythm and hemodynamically stable  Chronic CHF (diastolic) Body habitus make exam difficult to assess volume OL, though edema is noted and she reports DOE worse then her baseline Will pulse her torsemide again 80mg  BID for 3 days, take an additional 40meq K+ for the same 3 days Will ask that she be seen by HF team in the next few weeks  OSA Not addressed today  HTN Looks OK  5. R knee trauma Hemarthrosis Cellulitis Sees her PMD next week Advised [facility] if additional pain management was needed prior to her PMD visit   Disposition: F/u with HF team in the next few weeks, EP in 93mo, sooner if needed, pending labs    Current medicines are reviewed at length with the patient today.  The patient did not have any concerns regarding medicines.  Venetia Night, PA-C 05/03/2021 5:23 PM     [address] [place] [address] [phone] (office)  [phone] (fax)

## 2021-05-04 LAB — BASIC METABOLIC PANEL
BUN/Creatinine Ratio: 25 (ref 12–28)
BUN: 38 mg/dL — ABNORMAL HIGH (ref 8–27)
CO2: 25 mmol/L (ref 20–29)
Calcium: 9.4 mg/dL (ref 8.7–10.3)
Chloride: 99 mmol/L (ref 96–106)
Creatinine, Ser: 1.53 mg/dL — ABNORMAL HIGH (ref 0.57–1.00)
Glucose: 75 mg/dL (ref 70–99)
Potassium: 3.9 mmol/L (ref 3.5–5.2)
Sodium: 144 mmol/L (ref 134–144)
eGFR: 35 mL/min/{1.73_m2} — ABNORMAL LOW (ref >59)

## 2021-05-04 LAB — CBC
Hematocrit: 28.2 % — ABNORMAL LOW (ref 34.0–46.6)
Hemoglobin: 8.3 g/dL — ABNORMAL LOW (ref 11.1–15.9)
MCH: 23.3 pg — ABNORMAL LOW (ref 26.6–33.0)
MCHC: 29.4 g/dL — ABNORMAL LOW (ref 31.5–35.7)
MCV: 79 fL (ref 79–97)
Platelets: 422 10*3/uL (ref 150–450)
RBC: 3.56 x10E6/uL — ABNORMAL LOW (ref 3.77–5.28)
RDW: 19.4 % — ABNORMAL HIGH (ref 11.7–15.4)
WBC: 7.7 10*3/uL (ref 3.4–10.8)

## 2021-05-04 LAB — MAGNESIUM: Magnesium: 2.2 mg/dL (ref 1.6–2.3)

## 2021-05-07 ENCOUNTER — Telehealth: Payer: Self-pay | Admitting: Physician Assistant

## 2021-05-07 NOTE — Telephone Encounter (Signed)
Returned call to patient and spoke with patient and daughter who state patient has not noticed an improvement in swelling since increasing her dose of torsemide. She has resumed her previous dose of 60 mg TID. I asked about SOB and daughter states that patient feels great when she is sitting but gives out of breath with activity. Daughter states patient is inactive and she feels that she does not move enough. Patient states someone needs to give her something for leg pain so that she can be more active. I asked about follow-up from hospitalization for cellulitis and leg abscess and daughter states they have an appointment tomorrow. I offered advice regarding leg elevation and range of motion/walking around inside the house since they state outside is too "hilly." Advised that leg pain should be addressed by PCP or orthopedics as advised following hospital discharge and that cardiology would not be appropriate specialty for managing leg pain. Patient also has home health nursing and PT involved in care. Patient and daughter verbalized understanding and thanked me for the call.

## 2021-05-07 NOTE — Telephone Encounter (Signed)
Patient states she is returning call. She states that the extra dosage didn't seam to get the fluid off.

## 2021-05-08 ENCOUNTER — Telehealth (HOSPITAL_COMMUNITY): Payer: Self-pay | Admitting: *Deleted

## 2021-05-08 DIAGNOSIS — E119 Type 2 diabetes mellitus without complications: Secondary | ICD-10-CM | POA: Diagnosis not present

## 2021-05-08 DIAGNOSIS — L03115 Cellulitis of right lower limb: Secondary | ICD-10-CM | POA: Diagnosis not present

## 2021-05-08 DIAGNOSIS — D508 Other iron deficiency anemias: Secondary | ICD-10-CM | POA: Diagnosis not present

## 2021-05-08 DIAGNOSIS — E039 Hypothyroidism, unspecified: Secondary | ICD-10-CM | POA: Diagnosis not present

## 2021-05-08 DIAGNOSIS — M79604 Pain in right leg: Secondary | ICD-10-CM | POA: Diagnosis not present

## 2021-05-08 NOTE — Telephone Encounter (Signed)
Left vm requesting pt return my call to schedule sooner appt per Jessica Milford,FNP.

## 2021-05-15 ENCOUNTER — Other Ambulatory Visit: Payer: Self-pay

## 2021-05-15 ENCOUNTER — Other Ambulatory Visit: Payer: Self-pay | Admitting: *Deleted

## 2021-05-15 MED ORDER — DOFETILIDE 250 MCG PO CAPS: 250.0000 ug | ORAL_CAPSULE | Freq: Two times a day (BID) | ORAL | 11 refills | Status: DC

## 2021-05-15 MED ORDER — LEVOTHYROXINE SODIUM 125 MCG PO TABS: 125.0000 ug | ORAL_TABLET | Freq: Every day | ORAL | 3 refills | Status: DC

## 2021-05-16 DIAGNOSIS — M1712 Unilateral primary osteoarthritis, left knee: Secondary | ICD-10-CM | POA: Diagnosis not present

## 2021-05-16 DIAGNOSIS — M545 Low back pain, unspecified: Secondary | ICD-10-CM | POA: Diagnosis not present

## 2021-05-16 DIAGNOSIS — M1711 Unilateral primary osteoarthritis, right knee: Secondary | ICD-10-CM | POA: Diagnosis not present

## 2021-05-17 DIAGNOSIS — I480 Paroxysmal atrial fibrillation: Secondary | ICD-10-CM | POA: Diagnosis not present

## 2021-05-17 DIAGNOSIS — E039 Hypothyroidism, unspecified: Secondary | ICD-10-CM | POA: Diagnosis not present

## 2021-05-17 DIAGNOSIS — K219 Gastro-esophageal reflux disease without esophagitis: Secondary | ICD-10-CM | POA: Diagnosis not present

## 2021-05-17 DIAGNOSIS — Z7984 Long term (current) use of oral hypoglycemic drugs: Secondary | ICD-10-CM | POA: Diagnosis not present

## 2021-05-17 DIAGNOSIS — Z7901 Long term (current) use of anticoagulants: Secondary | ICD-10-CM | POA: Diagnosis not present

## 2021-05-17 DIAGNOSIS — M1711 Unilateral primary osteoarthritis, right knee: Secondary | ICD-10-CM | POA: Diagnosis not present

## 2021-05-17 DIAGNOSIS — L02415 Cutaneous abscess of right lower limb: Secondary | ICD-10-CM | POA: Diagnosis not present

## 2021-05-17 DIAGNOSIS — L03115 Cellulitis of right lower limb: Secondary | ICD-10-CM | POA: Diagnosis not present

## 2021-05-17 DIAGNOSIS — Z9181 History of falling: Secondary | ICD-10-CM | POA: Diagnosis not present

## 2021-05-17 DIAGNOSIS — I509 Heart failure, unspecified: Secondary | ICD-10-CM | POA: Diagnosis not present

## 2021-05-17 DIAGNOSIS — H409 Unspecified glaucoma: Secondary | ICD-10-CM | POA: Diagnosis not present

## 2021-05-17 DIAGNOSIS — B9562 Methicillin resistant Staphylococcus aureus infection as the cause of diseases classified elsewhere: Secondary | ICD-10-CM | POA: Diagnosis not present

## 2021-05-17 DIAGNOSIS — I130 Hypertensive heart and chronic kidney disease with heart failure and stage 1 through stage 4 chronic kidney disease, or unspecified chronic kidney disease: Secondary | ICD-10-CM | POA: Diagnosis not present

## 2021-05-17 DIAGNOSIS — I872 Venous insufficiency (chronic) (peripheral): Secondary | ICD-10-CM | POA: Diagnosis not present

## 2021-05-17 DIAGNOSIS — M25061 Hemarthrosis, right knee: Secondary | ICD-10-CM | POA: Diagnosis not present

## 2021-05-17 DIAGNOSIS — M47816 Spondylosis without myelopathy or radiculopathy, lumbar region: Secondary | ICD-10-CM | POA: Diagnosis not present

## 2021-05-17 DIAGNOSIS — Z792 Long term (current) use of antibiotics: Secondary | ICD-10-CM | POA: Diagnosis not present

## 2021-05-17 DIAGNOSIS — E1151 Type 2 diabetes mellitus with diabetic peripheral angiopathy without gangrene: Secondary | ICD-10-CM | POA: Diagnosis not present

## 2021-05-17 DIAGNOSIS — D631 Anemia in chronic kidney disease: Secondary | ICD-10-CM | POA: Diagnosis not present

## 2021-05-17 DIAGNOSIS — N1832 Chronic kidney disease, stage 3b: Secondary | ICD-10-CM | POA: Diagnosis not present

## 2021-05-17 DIAGNOSIS — E1122 Type 2 diabetes mellitus with diabetic chronic kidney disease: Secondary | ICD-10-CM | POA: Diagnosis not present

## 2021-05-23 DIAGNOSIS — I130 Hypertensive heart and chronic kidney disease with heart failure and stage 1 through stage 4 chronic kidney disease, or unspecified chronic kidney disease: Secondary | ICD-10-CM | POA: Diagnosis not present

## 2021-05-23 DIAGNOSIS — D631 Anemia in chronic kidney disease: Secondary | ICD-10-CM | POA: Diagnosis not present

## 2021-05-23 DIAGNOSIS — E039 Hypothyroidism, unspecified: Secondary | ICD-10-CM | POA: Diagnosis not present

## 2021-05-23 DIAGNOSIS — Z9181 History of falling: Secondary | ICD-10-CM | POA: Diagnosis not present

## 2021-05-23 DIAGNOSIS — B9562 Methicillin resistant Staphylococcus aureus infection as the cause of diseases classified elsewhere: Secondary | ICD-10-CM | POA: Diagnosis not present

## 2021-05-23 DIAGNOSIS — I872 Venous insufficiency (chronic) (peripheral): Secondary | ICD-10-CM | POA: Diagnosis not present

## 2021-05-23 DIAGNOSIS — H409 Unspecified glaucoma: Secondary | ICD-10-CM | POA: Diagnosis not present

## 2021-05-23 DIAGNOSIS — Z792 Long term (current) use of antibiotics: Secondary | ICD-10-CM | POA: Diagnosis not present

## 2021-05-23 DIAGNOSIS — I509 Heart failure, unspecified: Secondary | ICD-10-CM | POA: Diagnosis not present

## 2021-05-23 DIAGNOSIS — M47816 Spondylosis without myelopathy or radiculopathy, lumbar region: Secondary | ICD-10-CM | POA: Diagnosis not present

## 2021-05-23 DIAGNOSIS — L03115 Cellulitis of right lower limb: Secondary | ICD-10-CM | POA: Diagnosis not present

## 2021-05-23 DIAGNOSIS — Z7984 Long term (current) use of oral hypoglycemic drugs: Secondary | ICD-10-CM | POA: Diagnosis not present

## 2021-05-23 DIAGNOSIS — M25061 Hemarthrosis, right knee: Secondary | ICD-10-CM | POA: Diagnosis not present

## 2021-05-23 DIAGNOSIS — K219 Gastro-esophageal reflux disease without esophagitis: Secondary | ICD-10-CM | POA: Diagnosis not present

## 2021-05-23 DIAGNOSIS — E1151 Type 2 diabetes mellitus with diabetic peripheral angiopathy without gangrene: Secondary | ICD-10-CM | POA: Diagnosis not present

## 2021-05-23 DIAGNOSIS — Z7901 Long term (current) use of anticoagulants: Secondary | ICD-10-CM | POA: Diagnosis not present

## 2021-05-23 DIAGNOSIS — N1832 Chronic kidney disease, stage 3b: Secondary | ICD-10-CM | POA: Diagnosis not present

## 2021-05-23 DIAGNOSIS — L02415 Cutaneous abscess of right lower limb: Secondary | ICD-10-CM | POA: Diagnosis not present

## 2021-05-23 DIAGNOSIS — I480 Paroxysmal atrial fibrillation: Secondary | ICD-10-CM | POA: Diagnosis not present

## 2021-05-23 DIAGNOSIS — E1122 Type 2 diabetes mellitus with diabetic chronic kidney disease: Secondary | ICD-10-CM | POA: Diagnosis not present

## 2021-05-23 DIAGNOSIS — M1711 Unilateral primary osteoarthritis, right knee: Secondary | ICD-10-CM | POA: Diagnosis not present

## 2021-05-29 DIAGNOSIS — Z7984 Long term (current) use of oral hypoglycemic drugs: Secondary | ICD-10-CM | POA: Diagnosis not present

## 2021-05-29 DIAGNOSIS — B9562 Methicillin resistant Staphylococcus aureus infection as the cause of diseases classified elsewhere: Secondary | ICD-10-CM | POA: Diagnosis not present

## 2021-05-29 DIAGNOSIS — L03115 Cellulitis of right lower limb: Secondary | ICD-10-CM | POA: Diagnosis not present

## 2021-05-29 DIAGNOSIS — E1151 Type 2 diabetes mellitus with diabetic peripheral angiopathy without gangrene: Secondary | ICD-10-CM | POA: Diagnosis not present

## 2021-05-29 DIAGNOSIS — L02415 Cutaneous abscess of right lower limb: Secondary | ICD-10-CM | POA: Diagnosis not present

## 2021-05-29 DIAGNOSIS — Z9181 History of falling: Secondary | ICD-10-CM | POA: Diagnosis not present

## 2021-05-29 DIAGNOSIS — Z792 Long term (current) use of antibiotics: Secondary | ICD-10-CM | POA: Diagnosis not present

## 2021-05-29 DIAGNOSIS — E039 Hypothyroidism, unspecified: Secondary | ICD-10-CM | POA: Diagnosis not present

## 2021-05-29 DIAGNOSIS — M1711 Unilateral primary osteoarthritis, right knee: Secondary | ICD-10-CM | POA: Diagnosis not present

## 2021-05-29 DIAGNOSIS — M25061 Hemarthrosis, right knee: Secondary | ICD-10-CM | POA: Diagnosis not present

## 2021-05-29 DIAGNOSIS — I872 Venous insufficiency (chronic) (peripheral): Secondary | ICD-10-CM | POA: Diagnosis not present

## 2021-05-29 DIAGNOSIS — I509 Heart failure, unspecified: Secondary | ICD-10-CM | POA: Diagnosis not present

## 2021-05-29 DIAGNOSIS — K219 Gastro-esophageal reflux disease without esophagitis: Secondary | ICD-10-CM | POA: Diagnosis not present

## 2021-05-29 DIAGNOSIS — I480 Paroxysmal atrial fibrillation: Secondary | ICD-10-CM | POA: Diagnosis not present

## 2021-05-29 DIAGNOSIS — M47816 Spondylosis without myelopathy or radiculopathy, lumbar region: Secondary | ICD-10-CM | POA: Diagnosis not present

## 2021-05-29 DIAGNOSIS — I130 Hypertensive heart and chronic kidney disease with heart failure and stage 1 through stage 4 chronic kidney disease, or unspecified chronic kidney disease: Secondary | ICD-10-CM | POA: Diagnosis not present

## 2021-05-29 DIAGNOSIS — N1832 Chronic kidney disease, stage 3b: Secondary | ICD-10-CM | POA: Diagnosis not present

## 2021-05-29 DIAGNOSIS — D631 Anemia in chronic kidney disease: Secondary | ICD-10-CM | POA: Diagnosis not present

## 2021-05-29 DIAGNOSIS — H409 Unspecified glaucoma: Secondary | ICD-10-CM | POA: Diagnosis not present

## 2021-05-29 DIAGNOSIS — Z7901 Long term (current) use of anticoagulants: Secondary | ICD-10-CM | POA: Diagnosis not present

## 2021-05-29 DIAGNOSIS — E1122 Type 2 diabetes mellitus with diabetic chronic kidney disease: Secondary | ICD-10-CM | POA: Diagnosis not present

## 2021-05-31 ENCOUNTER — Encounter (HOSPITAL_COMMUNITY): Payer: PPO

## 2021-06-04 DIAGNOSIS — M47816 Spondylosis without myelopathy or radiculopathy, lumbar region: Secondary | ICD-10-CM | POA: Diagnosis not present

## 2021-06-04 DIAGNOSIS — N1832 Chronic kidney disease, stage 3b: Secondary | ICD-10-CM | POA: Diagnosis not present

## 2021-06-04 DIAGNOSIS — D631 Anemia in chronic kidney disease: Secondary | ICD-10-CM | POA: Diagnosis not present

## 2021-06-04 DIAGNOSIS — I480 Paroxysmal atrial fibrillation: Secondary | ICD-10-CM | POA: Diagnosis not present

## 2021-06-04 DIAGNOSIS — I872 Venous insufficiency (chronic) (peripheral): Secondary | ICD-10-CM | POA: Diagnosis not present

## 2021-06-04 DIAGNOSIS — L02415 Cutaneous abscess of right lower limb: Secondary | ICD-10-CM | POA: Diagnosis not present

## 2021-06-04 DIAGNOSIS — E1122 Type 2 diabetes mellitus with diabetic chronic kidney disease: Secondary | ICD-10-CM | POA: Diagnosis not present

## 2021-06-04 DIAGNOSIS — M25061 Hemarthrosis, right knee: Secondary | ICD-10-CM | POA: Diagnosis not present

## 2021-06-04 DIAGNOSIS — L03115 Cellulitis of right lower limb: Secondary | ICD-10-CM | POA: Diagnosis not present

## 2021-06-04 DIAGNOSIS — I130 Hypertensive heart and chronic kidney disease with heart failure and stage 1 through stage 4 chronic kidney disease, or unspecified chronic kidney disease: Secondary | ICD-10-CM | POA: Diagnosis not present

## 2021-06-04 DIAGNOSIS — E1151 Type 2 diabetes mellitus with diabetic peripheral angiopathy without gangrene: Secondary | ICD-10-CM | POA: Diagnosis not present

## 2021-06-04 DIAGNOSIS — I509 Heart failure, unspecified: Secondary | ICD-10-CM | POA: Diagnosis not present

## 2021-06-04 DIAGNOSIS — H409 Unspecified glaucoma: Secondary | ICD-10-CM | POA: Diagnosis not present

## 2021-06-04 DIAGNOSIS — B9562 Methicillin resistant Staphylococcus aureus infection as the cause of diseases classified elsewhere: Secondary | ICD-10-CM | POA: Diagnosis not present

## 2021-06-04 DIAGNOSIS — Z792 Long term (current) use of antibiotics: Secondary | ICD-10-CM | POA: Diagnosis not present

## 2021-06-04 DIAGNOSIS — Z7984 Long term (current) use of oral hypoglycemic drugs: Secondary | ICD-10-CM | POA: Diagnosis not present

## 2021-06-04 DIAGNOSIS — K219 Gastro-esophageal reflux disease without esophagitis: Secondary | ICD-10-CM | POA: Diagnosis not present

## 2021-06-04 DIAGNOSIS — E039 Hypothyroidism, unspecified: Secondary | ICD-10-CM | POA: Diagnosis not present

## 2021-06-04 DIAGNOSIS — Z9181 History of falling: Secondary | ICD-10-CM | POA: Diagnosis not present

## 2021-06-04 DIAGNOSIS — Z7901 Long term (current) use of anticoagulants: Secondary | ICD-10-CM | POA: Diagnosis not present

## 2021-06-04 DIAGNOSIS — M1711 Unilateral primary osteoarthritis, right knee: Secondary | ICD-10-CM | POA: Diagnosis not present

## 2021-06-05 ENCOUNTER — Ambulatory Visit: Payer: Medicare Other | Admitting: Podiatry

## 2021-06-05 ENCOUNTER — Other Ambulatory Visit: Payer: Self-pay

## 2021-06-05 MED ORDER — APIXABAN 5 MG PO TABS
5.0000 mg | ORAL_TABLET | Freq: Two times a day (BID) | ORAL | 1 refills | Status: DC
Start: 2021-06-05 — End: 2021-10-07

## 2021-06-05 NOTE — Telephone Encounter (Signed)
Prescription refill request for Eliquis received. Indication: Afib  Last office visit: 05/03/21 Charlcie Cradle)  Scr: 1.53 (05/03/21)  Age: 75 Weight: 129.7kg  Appropriate dose and refill sent to requested pharmacy.

## 2021-06-10 ENCOUNTER — Ambulatory Visit (INDEPENDENT_AMBULATORY_CARE_PROVIDER_SITE_OTHER): Payer: Medicare Other | Admitting: Podiatry

## 2021-06-10 ENCOUNTER — Encounter: Payer: Self-pay | Admitting: Podiatry

## 2021-06-10 ENCOUNTER — Other Ambulatory Visit: Payer: Self-pay

## 2021-06-10 DIAGNOSIS — M79675 Pain in left toe(s): Secondary | ICD-10-CM

## 2021-06-10 DIAGNOSIS — E119 Type 2 diabetes mellitus without complications: Secondary | ICD-10-CM

## 2021-06-10 DIAGNOSIS — M79674 Pain in right toe(s): Secondary | ICD-10-CM | POA: Diagnosis not present

## 2021-06-10 DIAGNOSIS — B351 Tinea unguium: Secondary | ICD-10-CM

## 2021-06-10 DIAGNOSIS — I872 Venous insufficiency (chronic) (peripheral): Secondary | ICD-10-CM

## 2021-06-10 NOTE — Progress Notes (Signed)
This patient presents to the office with chief complaint of long thick nails and diabetic feet.  This patient  says there  is  no pain and discomfort in their feet.  This patient says there are long thick painful nails.  These nails are painful walking and wearing shoes.  Patient has no history of infection or drainage from both feet.  Patient is unable to  self treat his own nails . This patient presents  to the office today for treatment of the  long nails and a foot evaluation due to history of  diabetes. Patient has coagulation defect due to eliquis.  General Appearance  Alert, conversant and in no acute stress.  Vascular  Dorsalis pedis are palpable  bilaterally.  Posterior tibial pulses are absent  B/L secondary to swelling ankles and legs  B/L.  Capillary return is within normal limits  bilaterally. Temperature is within normal limits  bilaterally.  Neurologic  Senn-Weinstein monofilament wire test within normal limits  bilaterally. Muscle power within normal limits bilaterally.  Nails Thick disfigured discolored nails with subungual debris  from hallux to fifth toes bilaterally. No evidence of bacterial infection or drainage bilaterally.  Orthopedic  No limitations of motion of motion feet .  No crepitus or effusions noted.  No bony pathology or digital deformities noted.  Skin  normotropic skin with no porokeratosis noted bilaterally.  No signs of infections or ulcers noted.     Onychomycosis  Diabetes with no foot complications  IE  Debride nails x 10.  A diabetic foot exam was performed and there is evidence of  vascular  pathology and no evidence of neurologic pathology.    RTC 3 months.   Gardiner Barefoot DPM

## 2021-06-14 DIAGNOSIS — Z792 Long term (current) use of antibiotics: Secondary | ICD-10-CM | POA: Diagnosis not present

## 2021-06-14 DIAGNOSIS — M1711 Unilateral primary osteoarthritis, right knee: Secondary | ICD-10-CM | POA: Diagnosis not present

## 2021-06-14 DIAGNOSIS — B9562 Methicillin resistant Staphylococcus aureus infection as the cause of diseases classified elsewhere: Secondary | ICD-10-CM | POA: Diagnosis not present

## 2021-06-14 DIAGNOSIS — Z7901 Long term (current) use of anticoagulants: Secondary | ICD-10-CM | POA: Diagnosis not present

## 2021-06-14 DIAGNOSIS — L02415 Cutaneous abscess of right lower limb: Secondary | ICD-10-CM | POA: Diagnosis not present

## 2021-06-14 DIAGNOSIS — K219 Gastro-esophageal reflux disease without esophagitis: Secondary | ICD-10-CM | POA: Diagnosis not present

## 2021-06-14 DIAGNOSIS — L03115 Cellulitis of right lower limb: Secondary | ICD-10-CM | POA: Diagnosis not present

## 2021-06-14 DIAGNOSIS — I872 Venous insufficiency (chronic) (peripheral): Secondary | ICD-10-CM | POA: Diagnosis not present

## 2021-06-14 DIAGNOSIS — N1832 Chronic kidney disease, stage 3b: Secondary | ICD-10-CM | POA: Diagnosis not present

## 2021-06-14 DIAGNOSIS — I509 Heart failure, unspecified: Secondary | ICD-10-CM | POA: Diagnosis not present

## 2021-06-14 DIAGNOSIS — E1122 Type 2 diabetes mellitus with diabetic chronic kidney disease: Secondary | ICD-10-CM | POA: Diagnosis not present

## 2021-06-14 DIAGNOSIS — I480 Paroxysmal atrial fibrillation: Secondary | ICD-10-CM | POA: Diagnosis not present

## 2021-06-14 DIAGNOSIS — Z7984 Long term (current) use of oral hypoglycemic drugs: Secondary | ICD-10-CM | POA: Diagnosis not present

## 2021-06-14 DIAGNOSIS — E1151 Type 2 diabetes mellitus with diabetic peripheral angiopathy without gangrene: Secondary | ICD-10-CM | POA: Diagnosis not present

## 2021-06-14 DIAGNOSIS — M25061 Hemarthrosis, right knee: Secondary | ICD-10-CM | POA: Diagnosis not present

## 2021-06-14 DIAGNOSIS — D631 Anemia in chronic kidney disease: Secondary | ICD-10-CM | POA: Diagnosis not present

## 2021-06-14 DIAGNOSIS — I130 Hypertensive heart and chronic kidney disease with heart failure and stage 1 through stage 4 chronic kidney disease, or unspecified chronic kidney disease: Secondary | ICD-10-CM | POA: Diagnosis not present

## 2021-06-14 DIAGNOSIS — Z9181 History of falling: Secondary | ICD-10-CM | POA: Diagnosis not present

## 2021-06-14 DIAGNOSIS — H409 Unspecified glaucoma: Secondary | ICD-10-CM | POA: Diagnosis not present

## 2021-06-14 DIAGNOSIS — M47816 Spondylosis without myelopathy or radiculopathy, lumbar region: Secondary | ICD-10-CM | POA: Diagnosis not present

## 2021-06-14 DIAGNOSIS — E039 Hypothyroidism, unspecified: Secondary | ICD-10-CM | POA: Diagnosis not present

## 2021-06-21 ENCOUNTER — Telehealth (HOSPITAL_COMMUNITY): Payer: Self-pay

## 2021-06-21 NOTE — Telephone Encounter (Signed)
Called and left patient a detailed voice message to confirm/remind patient of their appointment at the [facility] on 06/24/21.

## 2021-06-24 ENCOUNTER — Encounter (HOSPITAL_COMMUNITY): Payer: PPO

## 2021-06-24 DIAGNOSIS — E039 Hypothyroidism, unspecified: Secondary | ICD-10-CM | POA: Diagnosis not present

## 2021-06-24 DIAGNOSIS — I5032 Chronic diastolic (congestive) heart failure: Secondary | ICD-10-CM | POA: Diagnosis not present

## 2021-06-24 DIAGNOSIS — I482 Chronic atrial fibrillation, unspecified: Secondary | ICD-10-CM | POA: Diagnosis not present

## 2021-06-24 DIAGNOSIS — N184 Chronic kidney disease, stage 4 (severe): Secondary | ICD-10-CM | POA: Diagnosis not present

## 2021-06-24 DIAGNOSIS — E7800 Pure hypercholesterolemia, unspecified: Secondary | ICD-10-CM | POA: Diagnosis not present

## 2021-07-01 ENCOUNTER — Encounter (HOSPITAL_COMMUNITY): Payer: PPO | Admitting: Cardiology

## 2021-07-04 ENCOUNTER — Ambulatory Visit: Payer: Medicaid Other | Admitting: Cardiology

## 2021-07-17 ENCOUNTER — Ambulatory Visit: Payer: PPO | Admitting: Cardiology

## 2021-07-26 DIAGNOSIS — I5032 Chronic diastolic (congestive) heart failure: Secondary | ICD-10-CM | POA: Diagnosis not present

## 2021-07-26 DIAGNOSIS — N184 Chronic kidney disease, stage 4 (severe): Secondary | ICD-10-CM | POA: Diagnosis not present

## 2021-07-26 DIAGNOSIS — E7800 Pure hypercholesterolemia, unspecified: Secondary | ICD-10-CM | POA: Diagnosis not present

## 2021-07-26 DIAGNOSIS — E039 Hypothyroidism, unspecified: Secondary | ICD-10-CM | POA: Diagnosis not present

## 2021-07-26 DIAGNOSIS — I482 Chronic atrial fibrillation, unspecified: Secondary | ICD-10-CM | POA: Diagnosis not present

## 2021-08-12 ENCOUNTER — Ambulatory Visit: Payer: Medicare Other | Admitting: Cardiology

## 2021-08-27 DIAGNOSIS — E7800 Pure hypercholesterolemia, unspecified: Secondary | ICD-10-CM | POA: Diagnosis not present

## 2021-08-27 DIAGNOSIS — I5032 Chronic diastolic (congestive) heart failure: Secondary | ICD-10-CM | POA: Diagnosis not present

## 2021-08-27 DIAGNOSIS — E039 Hypothyroidism, unspecified: Secondary | ICD-10-CM | POA: Diagnosis not present

## 2021-08-27 DIAGNOSIS — I482 Chronic atrial fibrillation, unspecified: Secondary | ICD-10-CM | POA: Diagnosis not present

## 2021-08-27 DIAGNOSIS — N184 Chronic kidney disease, stage 4 (severe): Secondary | ICD-10-CM | POA: Diagnosis not present

## 2021-09-28 IMAGING — DX DG CHEST 1V PORT
1 series · 1 of 1 positions shown · non-contrast
Comparison: 09/14/2016

CLINICAL DATA: Check PICC line placement

EXAM:
PORTABLE CHEST 1 VIEW

[chest]
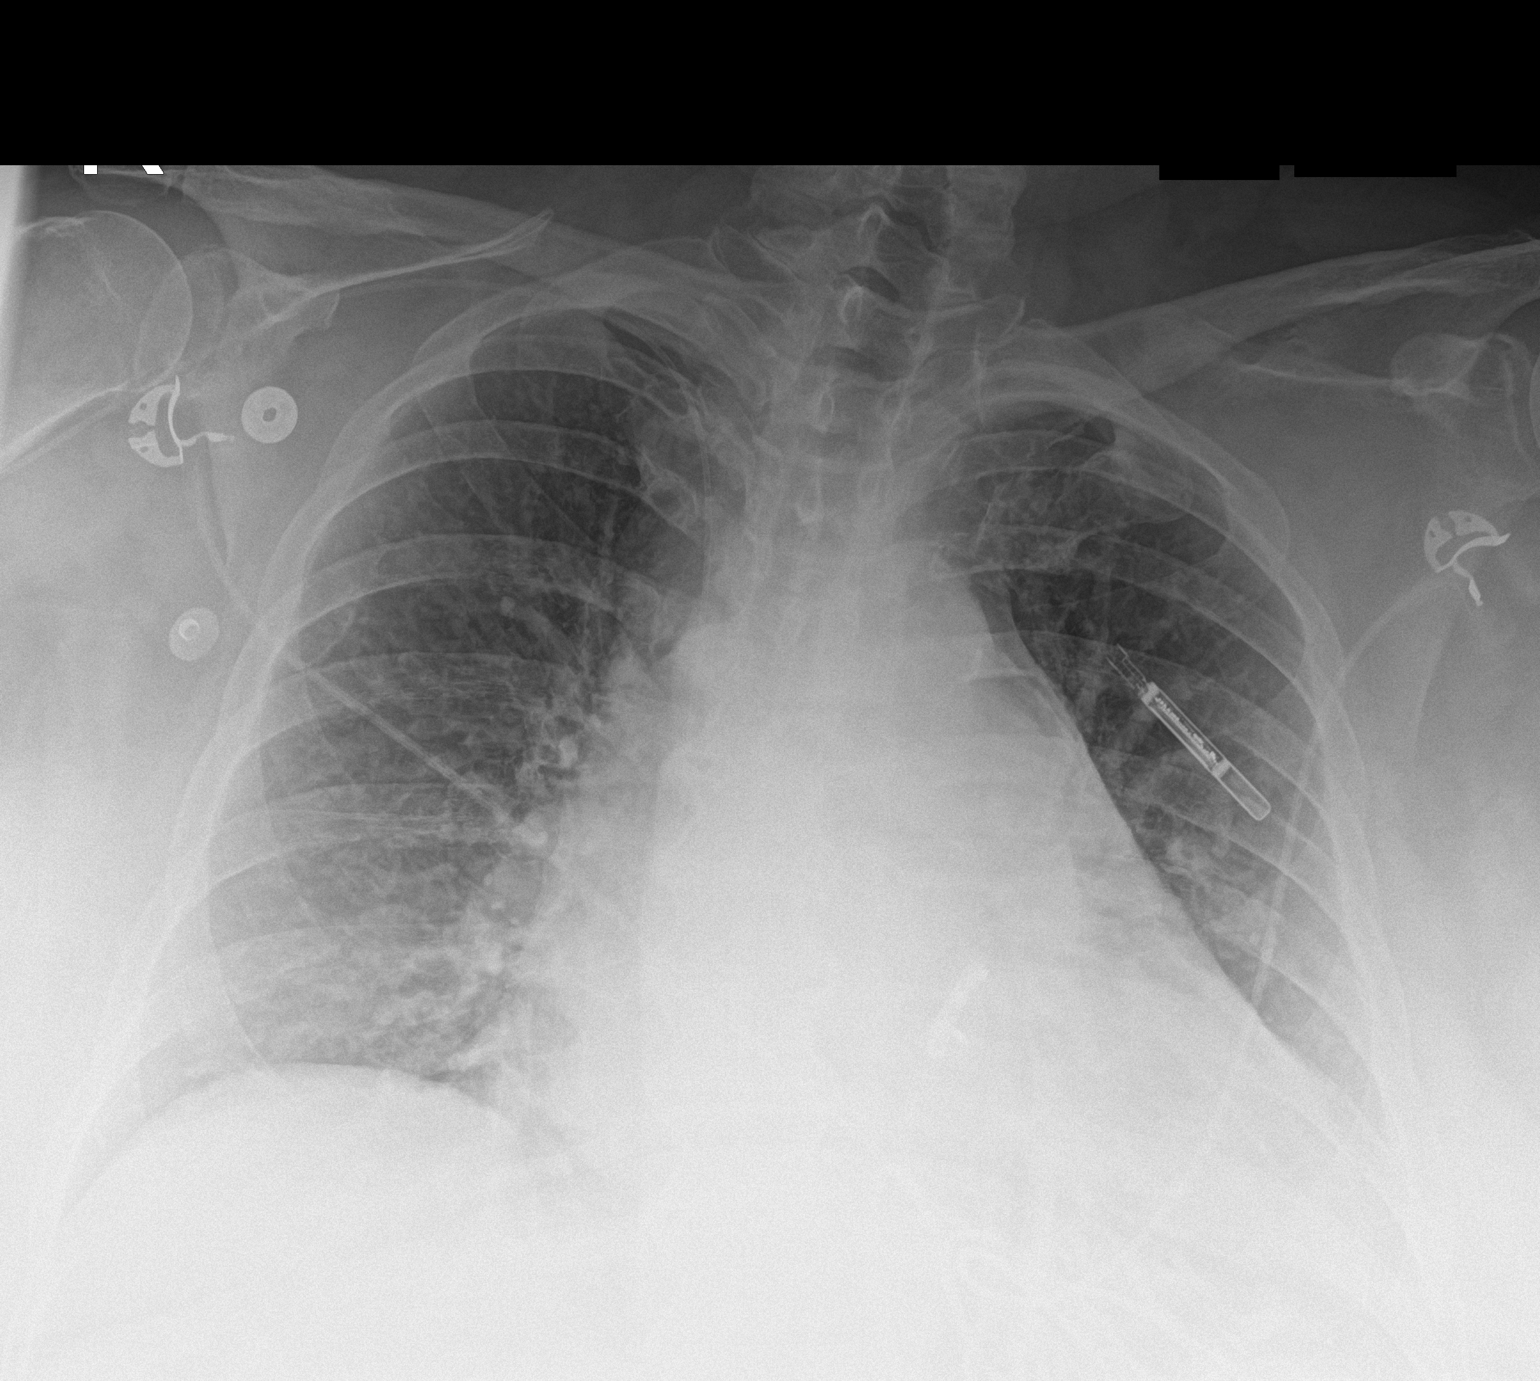

[1 of 1 positions shown; findings below may reference images not displayed]

FINDINGS: Cardiac shadow is enlarged but stable. Loop recorder is again noted
and stable. New right-sided PICC line is noted at the cavoatrial
junction in satisfactory position. Aortic calcifications are seen.
The lungs are well aerated bilaterally with mild vascular
congestion. No focal infiltrate is seen.
IMPRESSION: PICC line in satisfactory position.

Mild vascular congestion.

## 2021-09-30 IMAGING — CR DG ANKLE COMPLETE 3+V*L*
3 series · 3 of 3 positions shown · non-contrast
Comparison: 09/13/2016

CLINICAL DATA: Left ankle pain and swelling

EXAM:
LEFT ANKLE COMPLETE - 3+ VIEW

[ankle ap]
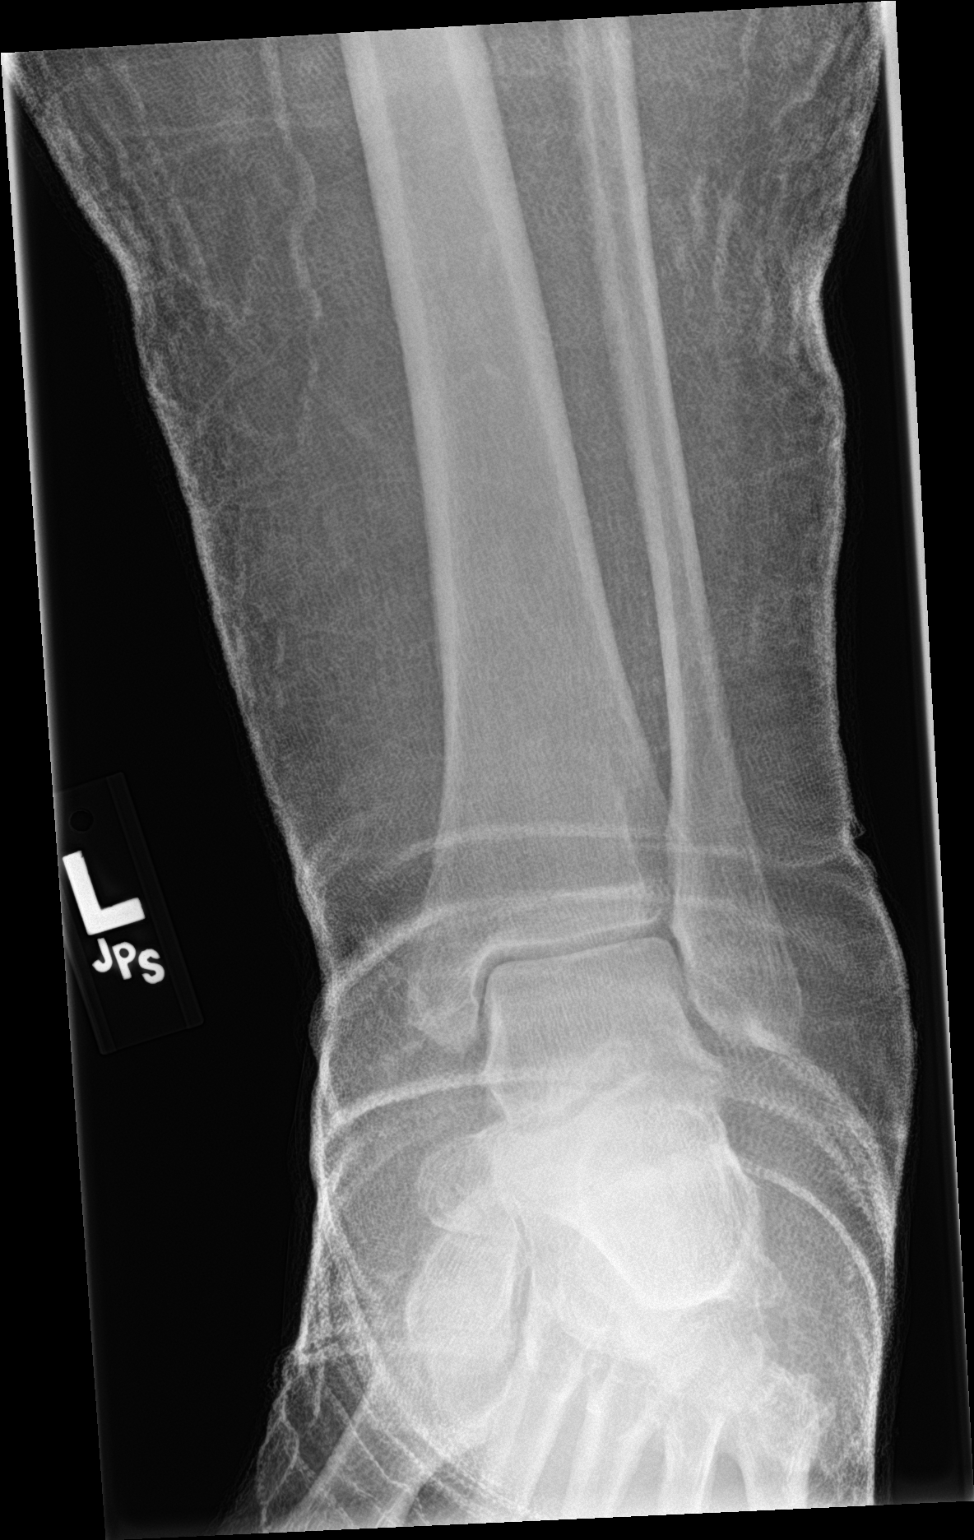

[ankle obl]
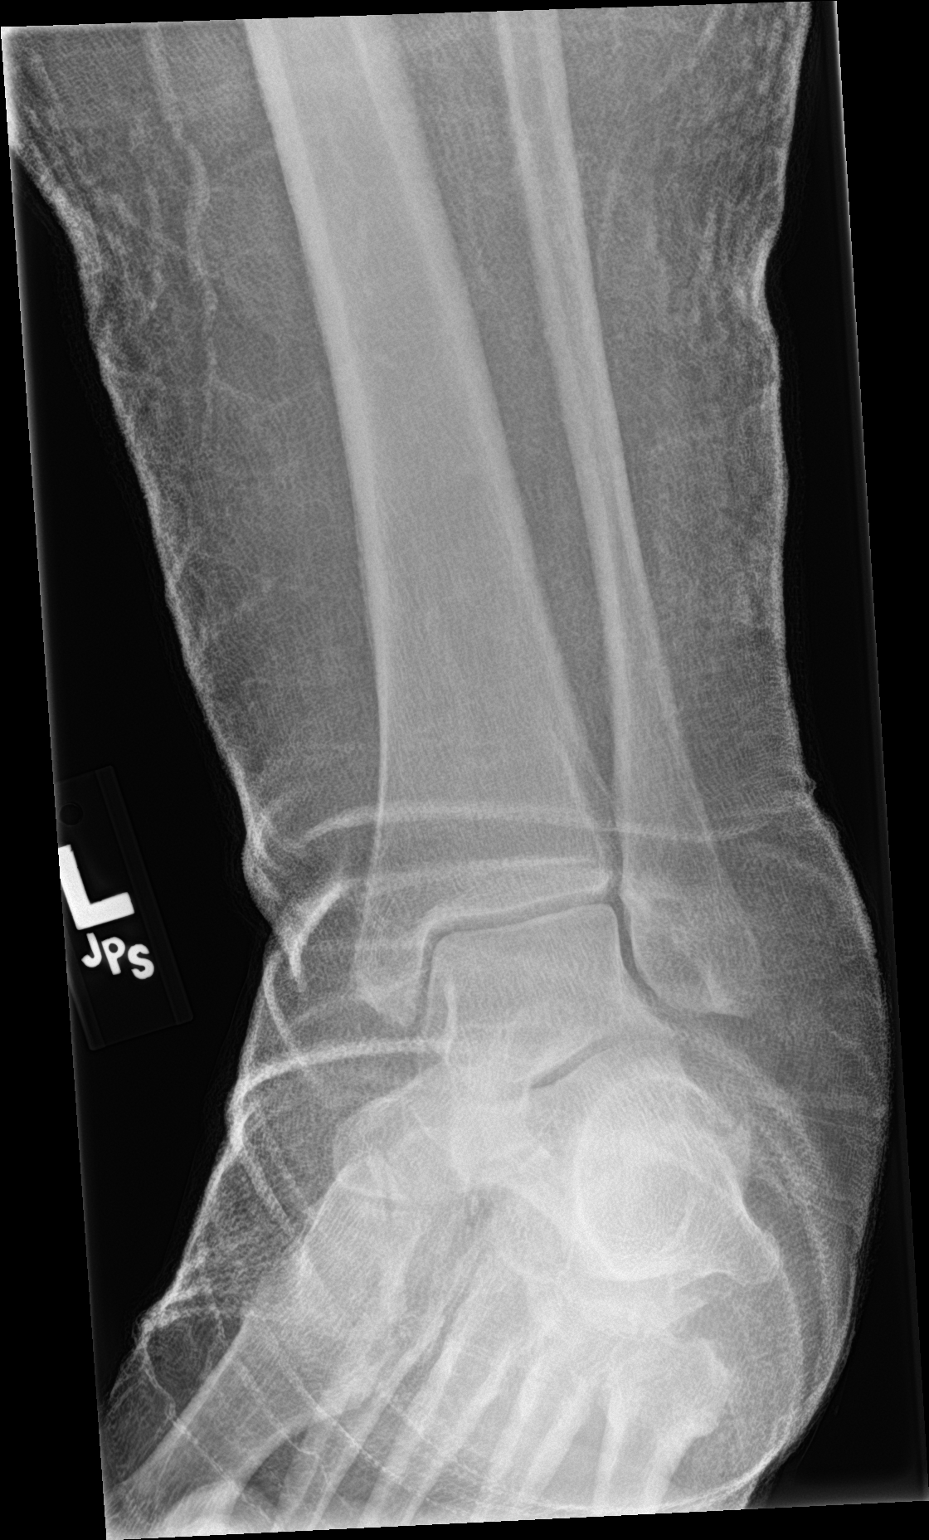

[ankle lat]
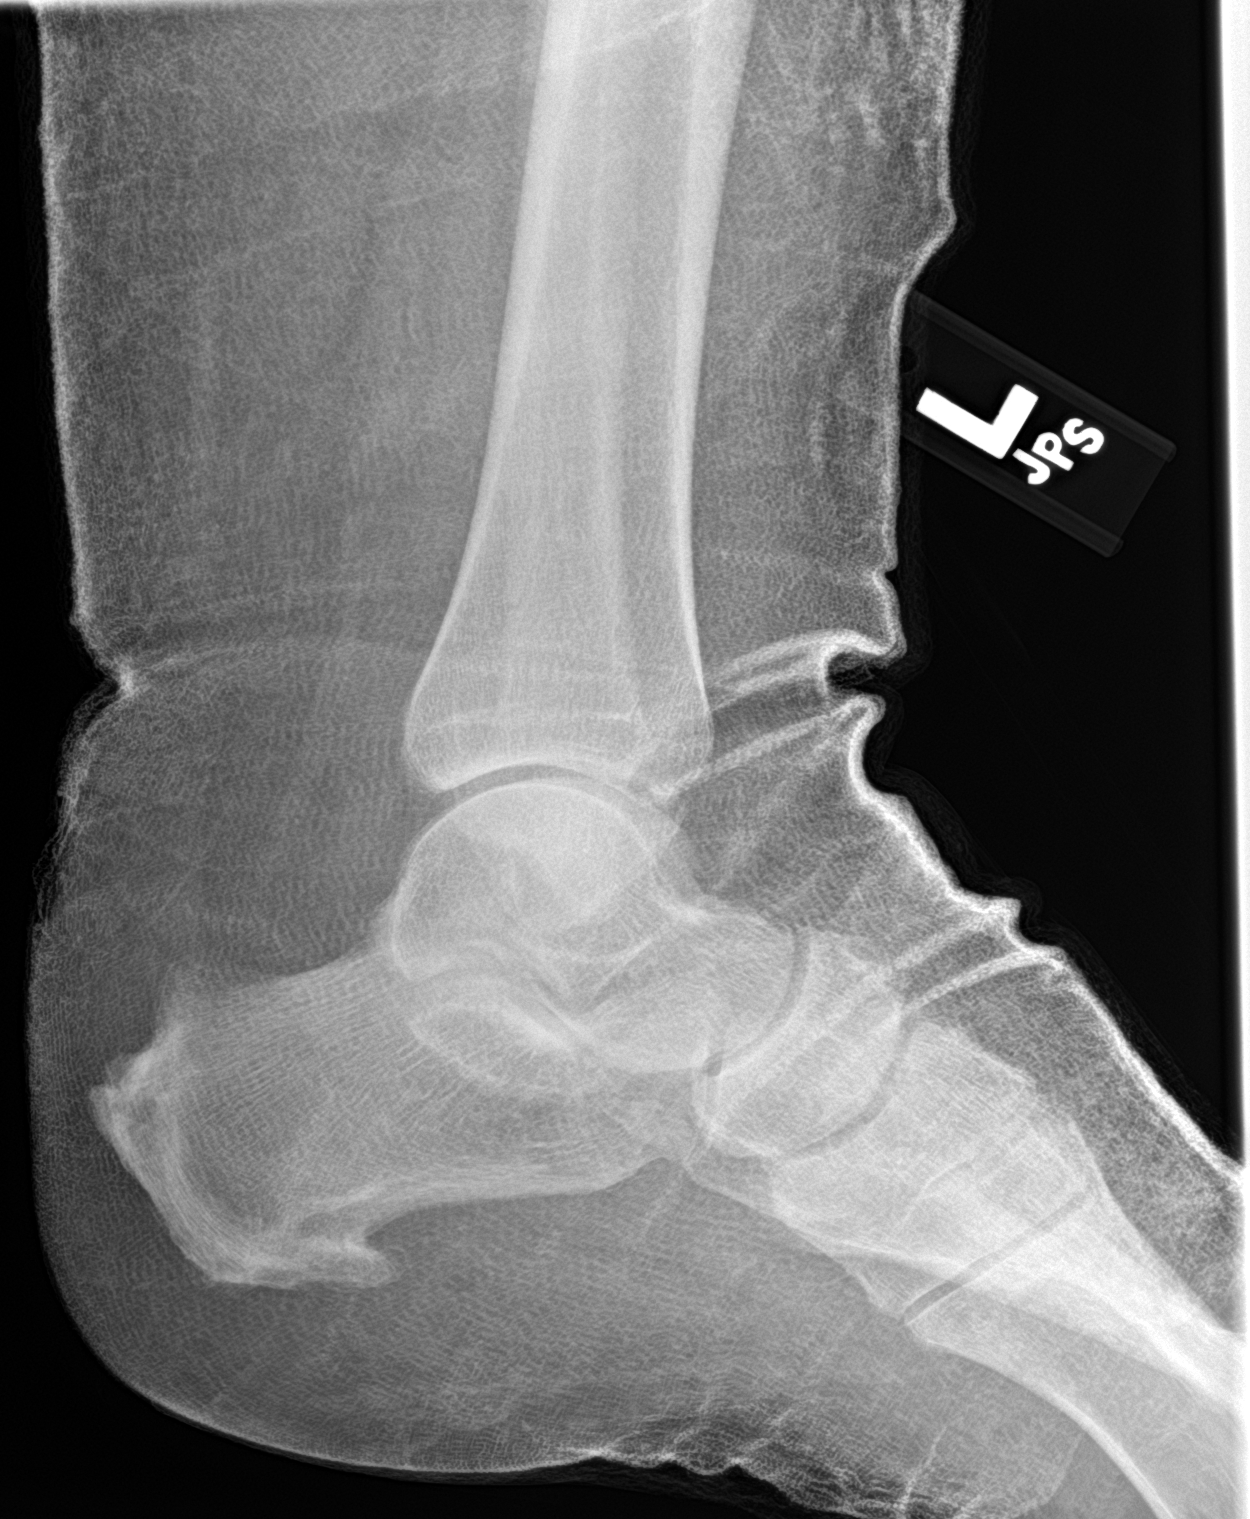

[3 of 3 positions shown; findings below may reference images not displayed]

FINDINGS: Generalized soft tissue swelling is noted about the ankle. Calcaneal
spurring is seen. Tarsal degenerative changes are noted as well. No
acute fracture or dislocation is seen.
IMPRESSION: Generalized soft tissue swelling without acute bony abnormality.

## 2021-10-07 ENCOUNTER — Other Ambulatory Visit: Payer: Self-pay

## 2021-10-07 DIAGNOSIS — I4819 Other persistent atrial fibrillation: Secondary | ICD-10-CM

## 2021-10-07 MED ORDER — APIXABAN 5 MG PO TABS
5.0000 mg | ORAL_TABLET | Freq: Two times a day (BID) | ORAL | 1 refills | Status: AC
Start: 2021-10-07 — End: ?

## 2021-10-07 NOTE — Telephone Encounter (Signed)
Prescription refill request for Eliquis received. ?Indication: Afib  ?Last office visit: 05/03/21 Charlcie Cradle) ?Scr: 1.53 (05/03/21) ?Age: 76 ?Weight: 129.7kg ? ?Appropriate dose and refill sent to requested pharmacy.  ?

## 2021-10-25 DIAGNOSIS — E039 Hypothyroidism, unspecified: Secondary | ICD-10-CM | POA: Diagnosis not present

## 2021-10-25 DIAGNOSIS — E119 Type 2 diabetes mellitus without complications: Secondary | ICD-10-CM | POA: Diagnosis not present

## 2021-10-25 DIAGNOSIS — I482 Chronic atrial fibrillation, unspecified: Secondary | ICD-10-CM | POA: Diagnosis not present

## 2021-10-25 DIAGNOSIS — N184 Chronic kidney disease, stage 4 (severe): Secondary | ICD-10-CM | POA: Diagnosis not present

## 2021-10-25 DIAGNOSIS — E7800 Pure hypercholesterolemia, unspecified: Secondary | ICD-10-CM | POA: Diagnosis not present

## 2021-10-25 DIAGNOSIS — M1710 Unilateral primary osteoarthritis, unspecified knee: Secondary | ICD-10-CM | POA: Diagnosis not present

## 2021-10-25 DIAGNOSIS — I5032 Chronic diastolic (congestive) heart failure: Secondary | ICD-10-CM | POA: Diagnosis not present

## 2021-11-18 ENCOUNTER — Other Ambulatory Visit: Payer: Self-pay

## 2021-11-18 MED ORDER — SPIRONOLACTONE 25 MG PO TABS: 12.5000 mg | ORAL_TABLET | Freq: Every day | ORAL | 1 refills | Status: DC

## 2021-11-22 ENCOUNTER — Other Ambulatory Visit: Payer: Self-pay

## 2021-11-22 MED ORDER — SPIRONOLACTONE 25 MG PO TABS: 12.5000 mg | ORAL_TABLET | Freq: Every day | ORAL | 1 refills | Status: AC

## 2021-12-12 ENCOUNTER — Other Ambulatory Visit (HOSPITAL_COMMUNITY): Payer: Self-pay | Admitting: Cardiology

## 2021-12-16 ENCOUNTER — Other Ambulatory Visit (HOSPITAL_COMMUNITY): Payer: Self-pay

## 2021-12-16 MED ORDER — TORSEMIDE 20 MG PO TABS: 60.0000 mg | ORAL_TABLET | Freq: Two times a day (BID) | ORAL | 2 refills | Status: DC

## 2022-04-20 ENCOUNTER — Other Ambulatory Visit (HOSPITAL_COMMUNITY): Payer: Self-pay | Admitting: Cardiology

## 2022-04-28 ENCOUNTER — Other Ambulatory Visit (HOSPITAL_COMMUNITY): Payer: Self-pay | Admitting: Physician Assistant

## 2022-04-28 DIAGNOSIS — I5032 Chronic diastolic (congestive) heart failure: Secondary | ICD-10-CM

## 2022-04-30 DIAGNOSIS — E7800 Pure hypercholesterolemia, unspecified: Secondary | ICD-10-CM | POA: Diagnosis not present

## 2022-04-30 DIAGNOSIS — N184 Chronic kidney disease, stage 4 (severe): Secondary | ICD-10-CM | POA: Diagnosis not present

## 2022-04-30 DIAGNOSIS — I5032 Chronic diastolic (congestive) heart failure: Secondary | ICD-10-CM | POA: Diagnosis not present

## 2022-04-30 DIAGNOSIS — D649 Anemia, unspecified: Secondary | ICD-10-CM | POA: Diagnosis not present

## 2022-04-30 DIAGNOSIS — E039 Hypothyroidism, unspecified: Secondary | ICD-10-CM | POA: Diagnosis not present

## 2022-04-30 DIAGNOSIS — G4733 Obstructive sleep apnea (adult) (pediatric): Secondary | ICD-10-CM | POA: Diagnosis not present

## 2022-04-30 DIAGNOSIS — Z23 Encounter for immunization: Secondary | ICD-10-CM | POA: Diagnosis not present

## 2022-04-30 DIAGNOSIS — I4891 Unspecified atrial fibrillation: Secondary | ICD-10-CM | POA: Diagnosis not present

## 2022-04-30 DIAGNOSIS — L98491 Non-pressure chronic ulcer of skin of other sites limited to breakdown of skin: Secondary | ICD-10-CM | POA: Diagnosis not present

## 2022-04-30 DIAGNOSIS — E119 Type 2 diabetes mellitus without complications: Secondary | ICD-10-CM | POA: Diagnosis not present

## 2022-05-12 ENCOUNTER — Other Ambulatory Visit: Payer: Self-pay

## 2022-05-12 NOTE — Telephone Encounter (Signed)
Pt's pharmacy is requesting a refill on levothyroxine. Would Dr. Curt Bears like to refill this medication? Please address

## 2022-05-14 ENCOUNTER — Other Ambulatory Visit: Payer: Self-pay | Admitting: *Deleted

## 2022-05-16 ENCOUNTER — Other Ambulatory Visit: Payer: Self-pay

## 2022-05-16 NOTE — Telephone Encounter (Signed)
Pt's pharmacy is requesting a refill on Levothyroxine 125 mcg tablets. Would Dr. Curt Bears like to refill this medication? Please address

## 2022-05-19 ENCOUNTER — Other Ambulatory Visit: Payer: Self-pay | Admitting: *Deleted

## 2022-05-21 ENCOUNTER — Other Ambulatory Visit (HOSPITAL_COMMUNITY): Payer: Self-pay | Admitting: Cardiology

## 2022-05-23 ENCOUNTER — Telehealth: Payer: Self-pay

## 2022-05-23 NOTE — Telephone Encounter (Signed)
[facility] is requesting a refill on Levothyroxine 125 mcg tablet. Would Dr. Curt Bears like to refill this medication? Please address

## 2022-05-26 NOTE — Telephone Encounter (Signed)
Refills for this should go through her PCP...... not sure why it was ever refilled under [facility].  Thanks

## 2022-05-26 NOTE — Telephone Encounter (Signed)
Called pt's pharmacy to inform them that pt needed to contact PCP for any refill for the medication levothyroxine. Pharmacist verbalized understanding.

## 2022-06-01 ENCOUNTER — Other Ambulatory Visit (HOSPITAL_COMMUNITY): Payer: Self-pay | Admitting: Cardiology

## 2022-07-15 ENCOUNTER — Other Ambulatory Visit (HOSPITAL_COMMUNITY): Payer: Self-pay | Admitting: Cardiology

## 2022-08-11 ENCOUNTER — Other Ambulatory Visit: Payer: Self-pay | Admitting: *Deleted

## 2022-08-11 DIAGNOSIS — I4819 Other persistent atrial fibrillation: Secondary | ICD-10-CM

## 2022-08-11 DIAGNOSIS — I482 Chronic atrial fibrillation, unspecified: Secondary | ICD-10-CM | POA: Diagnosis not present

## 2022-08-11 DIAGNOSIS — I83023 Varicose veins of left lower extremity with ulcer of ankle: Secondary | ICD-10-CM | POA: Diagnosis not present

## 2022-08-11 DIAGNOSIS — I5032 Chronic diastolic (congestive) heart failure: Secondary | ICD-10-CM | POA: Diagnosis not present

## 2022-08-11 DIAGNOSIS — E1169 Type 2 diabetes mellitus with other specified complication: Secondary | ICD-10-CM | POA: Diagnosis not present

## 2022-08-11 DIAGNOSIS — D6859 Other primary thrombophilia: Secondary | ICD-10-CM | POA: Diagnosis not present

## 2022-08-11 DIAGNOSIS — L97329 Non-pressure chronic ulcer of left ankle with unspecified severity: Secondary | ICD-10-CM | POA: Diagnosis not present

## 2022-08-11 NOTE — Telephone Encounter (Signed)
Prescription refill request for Eliquis received. Indication: Afib  Last office visit: 05/03/21 Charlcie Cradle)  Scr: 1.56 (04/30/22 via [facility])  Age: 77 Weight: 129.7kg  Office visit overdue. Message sent to schedulers.

## 2022-08-12 ENCOUNTER — Other Ambulatory Visit (HOSPITAL_COMMUNITY): Payer: Self-pay

## 2022-08-12 MED ORDER — POTASSIUM CHLORIDE CRYS ER 20 MEQ PO TBCR: 20.0000 meq | EXTENDED_RELEASE_TABLET | Freq: Three times a day (TID) | ORAL | 0 refills | Status: DC

## 2022-08-20 ENCOUNTER — Other Ambulatory Visit (HOSPITAL_COMMUNITY): Payer: Self-pay | Admitting: Physician Assistant

## 2022-08-20 DIAGNOSIS — I5032 Chronic diastolic (congestive) heart failure: Secondary | ICD-10-CM

## 2022-08-21 ENCOUNTER — Encounter (HOSPITAL_COMMUNITY): Payer: Self-pay | Admitting: *Deleted

## 2022-09-13 ENCOUNTER — Other Ambulatory Visit (HOSPITAL_COMMUNITY): Payer: Self-pay | Admitting: Family Medicine

## 2022-12-24 ENCOUNTER — Other Ambulatory Visit (HOSPITAL_COMMUNITY): Payer: Self-pay | Admitting: Cardiology

## 2023-01-23 ENCOUNTER — Other Ambulatory Visit (HOSPITAL_COMMUNITY): Payer: Self-pay

## 2023-01-23 DIAGNOSIS — I5032 Chronic diastolic (congestive) heart failure: Secondary | ICD-10-CM

## 2023-01-23 MED ORDER — POTASSIUM CHLORIDE ER 20 MEQ PO TBCR: EXTENDED_RELEASE_TABLET | ORAL | 0 refills | Status: DC

## 2023-03-02 ENCOUNTER — Other Ambulatory Visit (HOSPITAL_COMMUNITY): Payer: Self-pay | Admitting: Family Medicine

## 2023-03-02 DIAGNOSIS — I5032 Chronic diastolic (congestive) heart failure: Secondary | ICD-10-CM

## 2023-03-03 ENCOUNTER — Other Ambulatory Visit (HOSPITAL_COMMUNITY): Payer: Self-pay | Admitting: Family Medicine

## 2023-03-03 DIAGNOSIS — I5032 Chronic diastolic (congestive) heart failure: Secondary | ICD-10-CM

## 2023-04-04 ENCOUNTER — Other Ambulatory Visit (HOSPITAL_COMMUNITY): Payer: Self-pay | Admitting: Family Medicine

## 2023-04-04 DIAGNOSIS — I5032 Chronic diastolic (congestive) heart failure: Secondary | ICD-10-CM

## 2023-04-14 ENCOUNTER — Other Ambulatory Visit (HOSPITAL_COMMUNITY): Payer: Self-pay | Admitting: Family Medicine

## 2023-04-14 DIAGNOSIS — I5032 Chronic diastolic (congestive) heart failure: Secondary | ICD-10-CM

## 2023-04-27 ENCOUNTER — Inpatient Hospital Stay (HOSPITAL_COMMUNITY)
Admission: EM | Admit: 2023-04-27 | Discharge: 2023-05-08 | DRG: 602 | Disposition: A | Discharge status: Skilled Nursing Facility | Payer: 59 | Attending: Internal Medicine | Admitting: Internal Medicine

## 2023-04-27 ENCOUNTER — Other Ambulatory Visit: Payer: Self-pay

## 2023-04-27 ENCOUNTER — Emergency Department (HOSPITAL_COMMUNITY): Payer: 59

## 2023-04-27 ENCOUNTER — Encounter (HOSPITAL_COMMUNITY): Payer: Self-pay

## 2023-04-27 ENCOUNTER — Observation Stay (HOSPITAL_COMMUNITY): Payer: 59

## 2023-04-27 DIAGNOSIS — N183 Chronic kidney disease, stage 3 unspecified: Secondary | ICD-10-CM

## 2023-04-27 DIAGNOSIS — I4811 Longstanding persistent atrial fibrillation: Secondary | ICD-10-CM | POA: Diagnosis not present

## 2023-04-27 DIAGNOSIS — D631 Anemia in chronic kidney disease: Secondary | ICD-10-CM | POA: Diagnosis not present

## 2023-04-27 DIAGNOSIS — E119 Type 2 diabetes mellitus without complications: Secondary | ICD-10-CM

## 2023-04-27 DIAGNOSIS — Z7901 Long term (current) use of anticoagulants: Secondary | ICD-10-CM | POA: Diagnosis not present

## 2023-04-27 DIAGNOSIS — Z5320 Procedure and treatment not carried out because of patient's decision for unspecified reasons: Secondary | ICD-10-CM | POA: Diagnosis present

## 2023-04-27 DIAGNOSIS — R9431 Abnormal electrocardiogram [ECG] [EKG]: Secondary | ICD-10-CM | POA: Diagnosis not present

## 2023-04-27 DIAGNOSIS — L97919 Non-pressure chronic ulcer of unspecified part of right lower leg with unspecified severity: Secondary | ICD-10-CM | POA: Diagnosis present

## 2023-04-27 DIAGNOSIS — I6523 Occlusion and stenosis of bilateral carotid arteries: Secondary | ICD-10-CM | POA: Diagnosis not present

## 2023-04-27 DIAGNOSIS — I5082 Biventricular heart failure: Secondary | ICD-10-CM | POA: Diagnosis present

## 2023-04-27 DIAGNOSIS — E1122 Type 2 diabetes mellitus with diabetic chronic kidney disease: Secondary | ICD-10-CM | POA: Diagnosis present

## 2023-04-27 DIAGNOSIS — R2243 Localized swelling, mass and lump, lower limb, bilateral: Secondary | ICD-10-CM | POA: Diagnosis not present

## 2023-04-27 DIAGNOSIS — I872 Venous insufficiency (chronic) (peripheral): Secondary | ICD-10-CM | POA: Diagnosis present

## 2023-04-27 DIAGNOSIS — L03115 Cellulitis of right lower limb: Principal | ICD-10-CM | POA: Diagnosis present

## 2023-04-27 DIAGNOSIS — I517 Cardiomegaly: Secondary | ICD-10-CM | POA: Diagnosis not present

## 2023-04-27 DIAGNOSIS — L8990 Pressure ulcer of unspecified site, unspecified stage: Secondary | ICD-10-CM | POA: Diagnosis not present

## 2023-04-27 DIAGNOSIS — I5023 Acute on chronic systolic (congestive) heart failure: Secondary | ICD-10-CM | POA: Diagnosis not present

## 2023-04-27 DIAGNOSIS — M7989 Other specified soft tissue disorders: Secondary | ICD-10-CM | POA: Diagnosis not present

## 2023-04-27 DIAGNOSIS — Z9181 History of falling: Secondary | ICD-10-CM

## 2023-04-27 DIAGNOSIS — I5033 Acute on chronic diastolic (congestive) heart failure: Secondary | ICD-10-CM | POA: Diagnosis not present

## 2023-04-27 DIAGNOSIS — I130 Hypertensive heart and chronic kidney disease with heart failure and stage 1 through stage 4 chronic kidney disease, or unspecified chronic kidney disease: Secondary | ICD-10-CM | POA: Diagnosis present

## 2023-04-27 DIAGNOSIS — I10 Essential (primary) hypertension: Secondary | ICD-10-CM | POA: Diagnosis not present

## 2023-04-27 DIAGNOSIS — Z91A98 Caregiver's noncompliance with patient's other medical treatment and regimen for other reason: Secondary | ICD-10-CM | POA: Diagnosis not present

## 2023-04-27 DIAGNOSIS — F419 Anxiety disorder, unspecified: Secondary | ICD-10-CM | POA: Diagnosis present

## 2023-04-27 DIAGNOSIS — G9341 Metabolic encephalopathy: Secondary | ICD-10-CM | POA: Diagnosis not present

## 2023-04-27 DIAGNOSIS — I4819 Other persistent atrial fibrillation: Secondary | ICD-10-CM | POA: Diagnosis present

## 2023-04-27 DIAGNOSIS — L03119 Cellulitis of unspecified part of limb: Secondary | ICD-10-CM | POA: Diagnosis not present

## 2023-04-27 DIAGNOSIS — Z7984 Long term (current) use of oral hypoglycemic drugs: Secondary | ICD-10-CM

## 2023-04-27 DIAGNOSIS — I472 Ventricular tachycardia, unspecified: Secondary | ICD-10-CM | POA: Diagnosis present

## 2023-04-27 DIAGNOSIS — F32A Depression, unspecified: Secondary | ICD-10-CM | POA: Diagnosis present

## 2023-04-27 DIAGNOSIS — M2540 Effusion, unspecified joint: Secondary | ICD-10-CM | POA: Diagnosis not present

## 2023-04-27 DIAGNOSIS — N1832 Chronic kidney disease, stage 3b: Secondary | ICD-10-CM | POA: Diagnosis present

## 2023-04-27 DIAGNOSIS — I5031 Acute diastolic (congestive) heart failure: Secondary | ICD-10-CM | POA: Diagnosis not present

## 2023-04-27 DIAGNOSIS — E7800 Pure hypercholesterolemia, unspecified: Secondary | ICD-10-CM | POA: Diagnosis present

## 2023-04-27 DIAGNOSIS — R600 Localized edema: Secondary | ICD-10-CM

## 2023-04-27 DIAGNOSIS — Z801 Family history of malignant neoplasm of trachea, bronchus and lung: Secondary | ICD-10-CM

## 2023-04-27 DIAGNOSIS — L03116 Cellulitis of left lower limb: Secondary | ICD-10-CM | POA: Diagnosis not present

## 2023-04-27 DIAGNOSIS — R443 Hallucinations, unspecified: Secondary | ICD-10-CM | POA: Diagnosis present

## 2023-04-27 DIAGNOSIS — M19072 Primary osteoarthritis, left ankle and foot: Secondary | ICD-10-CM | POA: Diagnosis not present

## 2023-04-27 DIAGNOSIS — I4510 Unspecified right bundle-branch block: Secondary | ICD-10-CM | POA: Diagnosis present

## 2023-04-27 DIAGNOSIS — Z800 Family history of malignant neoplasm of digestive organs: Secondary | ICD-10-CM

## 2023-04-27 DIAGNOSIS — I081 Rheumatic disorders of both mitral and tricuspid valves: Secondary | ICD-10-CM | POA: Diagnosis present

## 2023-04-27 DIAGNOSIS — M25462 Effusion, left knee: Secondary | ICD-10-CM | POA: Diagnosis not present

## 2023-04-27 DIAGNOSIS — I2781 Cor pulmonale (chronic): Secondary | ICD-10-CM

## 2023-04-27 DIAGNOSIS — I2721 Secondary pulmonary arterial hypertension: Secondary | ICD-10-CM | POA: Diagnosis not present

## 2023-04-27 DIAGNOSIS — Z743 Need for continuous supervision: Secondary | ICD-10-CM | POA: Diagnosis not present

## 2023-04-27 DIAGNOSIS — F4312 Post-traumatic stress disorder, chronic: Secondary | ICD-10-CM

## 2023-04-27 DIAGNOSIS — L97929 Non-pressure chronic ulcer of unspecified part of left lower leg with unspecified severity: Secondary | ICD-10-CM | POA: Diagnosis present

## 2023-04-27 DIAGNOSIS — Z79890 Hormone replacement therapy (postmenopausal): Secondary | ICD-10-CM

## 2023-04-27 DIAGNOSIS — I4891 Unspecified atrial fibrillation: Secondary | ICD-10-CM | POA: Diagnosis not present

## 2023-04-27 DIAGNOSIS — Z91199 Patient's noncompliance with other medical treatment and regimen due to unspecified reason: Secondary | ICD-10-CM

## 2023-04-27 DIAGNOSIS — G4733 Obstructive sleep apnea (adult) (pediatric): Secondary | ICD-10-CM | POA: Diagnosis not present

## 2023-04-27 DIAGNOSIS — M1711 Unilateral primary osteoarthritis, right knee: Secondary | ICD-10-CM | POA: Diagnosis not present

## 2023-04-27 DIAGNOSIS — E039 Hypothyroidism, unspecified: Secondary | ICD-10-CM | POA: Diagnosis not present

## 2023-04-27 DIAGNOSIS — R609 Edema, unspecified: Secondary | ICD-10-CM | POA: Diagnosis not present

## 2023-04-27 DIAGNOSIS — Z79899 Other long term (current) drug therapy: Secondary | ICD-10-CM

## 2023-04-27 DIAGNOSIS — M79606 Pain in leg, unspecified: Secondary | ICD-10-CM | POA: Diagnosis not present

## 2023-04-27 DIAGNOSIS — Z8249 Family history of ischemic heart disease and other diseases of the circulatory system: Secondary | ICD-10-CM

## 2023-04-27 DIAGNOSIS — Z91148 Patient's other noncompliance with medication regimen for other reason: Secondary | ICD-10-CM

## 2023-04-27 DIAGNOSIS — E8721 Acute metabolic acidosis: Secondary | ICD-10-CM | POA: Diagnosis present

## 2023-04-27 DIAGNOSIS — Z881 Allergy status to other antibiotic agents status: Secondary | ICD-10-CM

## 2023-04-27 DIAGNOSIS — R531 Weakness: Secondary | ICD-10-CM | POA: Diagnosis not present

## 2023-04-27 DIAGNOSIS — Z6841 Body Mass Index (BMI) 40.0 and over, adult: Secondary | ICD-10-CM

## 2023-04-27 DIAGNOSIS — M1712 Unilateral primary osteoarthritis, left knee: Secondary | ICD-10-CM | POA: Diagnosis not present

## 2023-04-27 DIAGNOSIS — L0390 Cellulitis, unspecified: Secondary | ICD-10-CM | POA: Diagnosis present

## 2023-04-27 LAB — AMMONIA: Ammonia: 27 umol/L (ref 9–35)

## 2023-04-27 LAB — CBC WITH DIFFERENTIAL/PLATELET
Abs Immature Granulocytes: 0.06 10*3/uL (ref 0.00–0.07)
Basophils Absolute: 0 10*3/uL (ref 0.0–0.1)
Basophils Relative: 0 %
Eosinophils Absolute: 0 10*3/uL (ref 0.0–0.5)
Eosinophils Relative: 0 %
HCT: 34.3 % — ABNORMAL LOW (ref 36.0–46.0)
Hemoglobin: 10 g/dL — ABNORMAL LOW (ref 12.0–15.0)
Immature Granulocytes: 1 %
Lymphocytes Relative: 3 %
Lymphs Abs: 0.3 10*3/uL — ABNORMAL LOW (ref 0.7–4.0)
MCH: 28 pg (ref 26.0–34.0)
MCHC: 29.2 g/dL — ABNORMAL LOW (ref 30.0–36.0)
MCV: 96.1 fL (ref 80.0–100.0)
Monocytes Absolute: 0.4 10*3/uL (ref 0.1–1.0)
Monocytes Relative: 4 %
Neutro Abs: 10.2 10*3/uL — ABNORMAL HIGH (ref 1.7–7.7)
Neutrophils Relative %: 92 %
Platelets: 199 10*3/uL (ref 150–400)
RBC: 3.57 MIL/uL — ABNORMAL LOW (ref 3.87–5.11)
RDW: 15.9 % — ABNORMAL HIGH (ref 11.5–15.5)
WBC: 10.9 10*3/uL — ABNORMAL HIGH (ref 4.0–10.5)
nRBC: 0 % (ref 0.0–0.2)

## 2023-04-27 LAB — COMPREHENSIVE METABOLIC PANEL
ALT: 20 U/L (ref 0–44)
AST: 21 U/L (ref 15–41)
Albumin: 3.6 g/dL (ref 3.5–5.0)
Alkaline Phosphatase: 62 U/L (ref 38–126)
Anion gap: 10 (ref 5–15)
BUN: 36 mg/dL — ABNORMAL HIGH (ref 8–23)
CO2: 20 mmol/L — ABNORMAL LOW (ref 22–32)
Calcium: 9.1 mg/dL (ref 8.9–10.3)
Chloride: 113 mmol/L — ABNORMAL HIGH (ref 98–111)
Creatinine, Ser: 1.31 mg/dL — ABNORMAL HIGH (ref 0.44–1.00)
GFR, Estimated: 42 mL/min — ABNORMAL LOW (ref >60)
Glucose, Bld: 142 mg/dL — ABNORMAL HIGH (ref 70–99)
Potassium: 4.4 mmol/L (ref 3.5–5.1)
Sodium: 143 mmol/L (ref 135–145)
Total Bilirubin: 1.8 mg/dL — ABNORMAL HIGH (ref 0.3–1.2)
Total Protein: 8 g/dL (ref 6.5–8.1)

## 2023-04-27 LAB — RAPID URINE DRUG SCREEN, HOSP PERFORMED
Amphetamines: NOT DETECTED
Barbiturates: NOT DETECTED
Benzodiazepines: NOT DETECTED
Cocaine: NOT DETECTED
Opiates: NOT DETECTED
Tetrahydrocannabinol: NOT DETECTED

## 2023-04-27 LAB — URINALYSIS, ROUTINE W REFLEX MICROSCOPIC
Bilirubin Urine: NEGATIVE
Glucose, UA: 150 mg/dL — AB
Hgb urine dipstick: NEGATIVE
Ketones, ur: NEGATIVE mg/dL
Leukocytes,Ua: NEGATIVE
Nitrite: NEGATIVE
Protein, ur: 30 mg/dL — AB
Specific Gravity, Urine: 1.027 (ref 1.005–1.030)
pH: 5 (ref 5.0–8.0)

## 2023-04-27 LAB — CBG MONITORING, ED
Glucose-Capillary: 127 mg/dL — ABNORMAL HIGH (ref 70–99)
Glucose-Capillary: 134 mg/dL — ABNORMAL HIGH (ref 70–99)

## 2023-04-27 LAB — I-STAT CG4 LACTIC ACID, ED
Lactic Acid, Venous: 1.2 mmol/L (ref 0.5–1.9)
Lactic Acid, Venous: 1.1 mmol/L (ref 0.5–1.9)

## 2023-04-27 LAB — BRAIN NATRIURETIC PEPTIDE: B Natriuretic Peptide: 314.2 pg/mL — ABNORMAL HIGH (ref 0.0–100.0)

## 2023-04-27 LAB — ETHANOL: Alcohol, Ethyl (B): <10 mg/dL (ref <10)

## 2023-04-27 LAB — GLUCOSE, CAPILLARY: Glucose-Capillary: 135 mg/dL — ABNORMAL HIGH (ref 70–99)

## 2023-04-27 MED ORDER — FUROSEMIDE 10 MG/ML IJ SOLN
40.0000 mg | Freq: Once | INTRAMUSCULAR | Status: AC
  Administered 2023-04-27: 40 mg via INTRAVENOUS
  Filled 2023-04-27: qty 4

## 2023-04-27 MED ORDER — INSULIN ASPART 100 UNIT/ML IJ SOLN
0.0000 [IU] | INTRAMUSCULAR | Status: DC
  Administered 2023-04-27: 1 [IU] via SUBCUTANEOUS
  Administered 2023-04-28: 2 [IU] via SUBCUTANEOUS
  Administered 2023-04-28: 1 [IU] via SUBCUTANEOUS
  Administered 2023-04-28: 2 [IU] via SUBCUTANEOUS
  Administered 2023-04-29 (×2): 1 [IU] via SUBCUTANEOUS
  Administered 2023-04-29: 2 [IU] via SUBCUTANEOUS
  Administered 2023-04-29 – 2023-04-30 (×3): 1 [IU] via SUBCUTANEOUS
  Administered 2023-05-01: 2 [IU] via SUBCUTANEOUS
  Administered 2023-05-01 – 2023-05-04 (×9): 1 [IU] via SUBCUTANEOUS
  Administered 2023-05-04: 2 [IU] via SUBCUTANEOUS
  Administered 2023-05-04: 1 [IU] via SUBCUTANEOUS
  Administered 2023-05-05: 2 [IU] via SUBCUTANEOUS
  Administered 2023-05-05 – 2023-05-06 (×7): 1 [IU] via SUBCUTANEOUS
  Administered 2023-05-07: 2 [IU] via SUBCUTANEOUS
  Administered 2023-05-07 – 2023-05-08 (×3): 1 [IU] via SUBCUTANEOUS
  Administered 2023-05-08: 2 [IU] via SUBCUTANEOUS
  Filled 2023-04-27: qty 0.09

## 2023-04-27 MED ORDER — SODIUM CHLORIDE 0.9 % IV SOLN
1.0000 g | Freq: Once | INTRAVENOUS | Status: AC
  Administered 2023-04-27: 1 g via INTRAVENOUS
  Filled 2023-04-27: qty 10

## 2023-04-27 MED ORDER — ACETAMINOPHEN 325 MG PO TABS
650.0000 mg | ORAL_TABLET | ORAL | Status: DC | PRN
  Administered 2023-04-28 – 2023-04-29 (×4): 650 mg via ORAL
  Filled 2023-04-27 (×4): qty 2

## 2023-04-27 MED ORDER — SODIUM CHLORIDE 0.9% FLUSH
3.0000 mL | Freq: Two times a day (BID) | INTRAVENOUS | Status: DC
  Administered 2023-04-27 – 2023-05-08 (×20): 3 mL via INTRAVENOUS

## 2023-04-27 MED ORDER — SODIUM CHLORIDE 0.9 % IV SOLN: 250.0000 mL | INTRAVENOUS | Status: AC | PRN

## 2023-04-27 MED ORDER — SODIUM CHLORIDE 0.9% FLUSH: 3.0000 mL | INTRAVENOUS | Status: DC | PRN

## 2023-04-27 MED ORDER — FUROSEMIDE 10 MG/ML IJ SOLN
40.0000 mg | Freq: Every day | INTRAMUSCULAR | Status: DC
  Administered 2023-04-28 – 2023-04-29 (×2): 40 mg via INTRAVENOUS
  Filled 2023-04-27 (×3): qty 4

## 2023-04-27 MED ORDER — ONDANSETRON HCL 4 MG/2ML IJ SOLN: 4.0000 mg | Freq: Four times a day (QID) | INTRAMUSCULAR | Status: DC | PRN

## 2023-04-27 MED ORDER — SODIUM CHLORIDE 0.9 % IV SOLN
2.0000 g | INTRAVENOUS | Status: AC
  Administered 2023-04-28 – 2023-05-04 (×7): 2 g via INTRAVENOUS
  Filled 2023-04-27 (×7): qty 20

## 2023-04-27 NOTE — ED Notes (Signed)
Pt having hallucinations that someone else was in her room. Physician present at the time.

## 2023-04-27 NOTE — Assessment & Plan Note (Addendum)
Home med rec pending Sensitive SSI Q4H for the moment

## 2023-04-27 NOTE — H&P (Signed)
History and Physical    PatientFlannery [name] [name] ZDG:[id number] DOB: [birth year] DOA: 04/27/2023 DOS: the patient was seen and examined on 04/27/2023 PCP: Darrow Bussing, MD  Patient coming from: Home  Chief Complaint:  Chief Complaint  Patient presents with   Fall   Leg Swelling   HPI: Hannah [name] is a 77 y.o. female with medical history significant of A.Fib, DM2, HTN, dCHF, morbid obesity.  Pt brought to ED by EMS after she called PCP and wasn't sure why she had called them.  AAOx4 but actively hallucinating in ED.  Pt lives at home with daughter.  Patient daughter reports that the patient has been unable to get out of the home for 1 year secondary to her "bad knees" and ulcers on her legs. Patient daughter reports that the patient is fallen 4 times in the last week. Patient daughter states that she is seeing things that are not there. Patient daughter states that she typically walks with a walker but is having trouble getting up on her own recently. The last 2 days the patient is unable to get out of her chair and her legs are very weak. The patient daughter denies any fevers at home but states that the hallucinations began when she started falling. She is also not taking medication to include Lasix and blood thinning medication.    Review of Systems: As mentioned in the history of present illness. All other systems reviewed and are negative. Past Medical History:  Diagnosis Date   Atrial fibrillation (HCC)    Diabetes mellitus without complication (HCC)    Glaucoma    BILATERAL   Hypertension    Thyroid disease    Volume overload 11/2019   Past Surgical History:  Procedure Laterality Date   APPENDECTOMY     ATRIAL FIBRILLATION ABLATION N/A 05/04/2020   Procedure: ATRIAL FIBRILLATION ABLATION;  Surgeon: Regan Lemming, MD;  Location: [facility] INVASIVE [facility] LAB;  Service: Cardiovascular;  Laterality: N/A;   CARDIOVERSION  11/24/2019   CARDIOVERSION N/A 11/24/2019    Procedure: CARDIOVERSION;  Surgeon: Laurey Morale, MD;  Location: [facility] ENDOSCOPY;  Service: Cardiovascular;  Laterality: N/A;   CARDIOVERSION N/A 11/29/2019   Procedure: CARDIOVERSION;  Surgeon: Laurey Morale, MD;  Location: [facility] ENDOSCOPY;  Service: Cardiovascular;  Laterality: N/A;   CARDIOVERSION N/A 06/19/2020   Procedure: CARDIOVERSION;  Surgeon: Lars Masson, MD;  Location: [facility] ENDOSCOPY;  Service: Cardiovascular;  Laterality: N/A;   CHOLECYSTECTOMY     HERNIA REPAIR     UMBILICAL   LASIK     BILATERAL   TEE WITHOUT CARDIOVERSION N/A 11/24/2019   Procedure: TRANSESOPHAGEAL ECHOCARDIOGRAM (TEE);  Surgeon: Laurey Morale, MD;  Location: [facility] ENDOSCOPY;  Service: Cardiovascular;  Laterality: N/A;   TEE WITHOUT CARDIOVERSION N/A 05/03/2020   Procedure: TRANSESOPHAGEAL ECHOCARDIOGRAM (TEE);  Surgeon: Little Ishikawa, MD;  Location: [facility] ENDOSCOPY;  Service: Cardiovascular;  Laterality: N/A;   Social History:  reports that she has never smoked. She has never used smokeless tobacco. She reports current alcohol use of about 1.0 standard drink of alcohol per week. She reports that she does not use drugs.  Allergies  Allergen Reactions   Penicillins Other (See Comments)    Possibility; father and brother allergic (FAMILY ALLERGY)   Azithromycin Hives, Rash and Other (See Comments)    Severe rash    Family History  Problem Relation Age of Onset   Lung cancer Mother    Colon cancer Father    Heart attack Brother  Heart disease Brother    Lung cancer Maternal Grandfather    Heart attack Paternal Grandfather     Prior to Admission medications   Medication Sig Start Date End Date Taking? Authorizing Provider  acetaminophen (TYLENOL) 500 MG tablet Take 500 mg by mouth every 6 (six) hours as needed for moderate pain.    [provider]  apixaban (ELIQUIS) 5 MG TABS tablet Take 1 tablet (5 mg total) by mouth 2 (two) times daily. 10/07/21   Camnitz, Will Daphine Deutscher, MD   busPIRone (BUSPAR) 15 MG tablet TAKE 1 TABLET THREE TIMES A DAY 12/24/17   Eksir, Bo Mcclintock, MD  diclofenac Sodium (VOLTAREN) 1 % GEL Apply 2 g topically 4 (four) times daily. Patient taking differently: Apply 2 g topically 4 (four) times daily as needed (pain). 03/14/21   Cristopher Peru, PA-C  dofetilide (TIKOSYN) 250 MCG capsule Take 1 capsule (250 mcg total) by mouth 2 (two) times daily. 05/15/21   Camnitz, Andree Coss, MD  DULoxetine (CYMBALTA) 30 MG capsule Take 3 capsules (90 mg total) by mouth daily. 11/05/17   Eksir, Bo Mcclintock, MD  empagliflozin (JARDIANCE) 10 MG TABS tablet Take 1 tablet (10 mg total) by mouth daily before breakfast. 02/15/21   Milford, Anderson Malta, FNP  esomeprazole (NEXIUM) 40 MG capsule Take 40 mg by mouth as needed (indigestion/heartburn).  11/14/19   [provider]  ferrous sulfate 325 (65 FE) MG tablet Take 1 tablet (325 mg total) by mouth 2 (two) times daily. 04/26/21 04/27/23  Dorcas Carrow, MD  KLOR-CON M20 20 MEQ tablet TAKE 1 TO 2 TABLETS BY MOUTH 3 TIMES DAILY. (TAKE 40 MEQ IN MORNING AND 20 MEQ IN EVENING).**NEED FOLLOW UP FOR MORE REFILLS** 09/15/22   Milford, Anderson Malta, FNP  levocetirizine (XYZAL) 5 MG tablet Take 5 mg by mouth at bedtime.     [provider]  levothyroxine (SYNTHROID) 125 MCG tablet Take 1 tablet (125 mcg total) by mouth daily. 05/15/21 04/27/23  Camnitz, Andree Coss, MD  lidocaine (LIDODERM) 5 % Place 1 patch onto the skin daily. Remove & Discard patch within 12 hours or as directed by MD 03/14/21   Cristopher Peru, PA-C  Misc. Devices (BARIATRIC ROLLATOR) MISC For home use 11/30/19   Robbie Lis M, PA-C  montelukast (SINGULAIR) 10 MG tablet Take 10 mg by mouth at bedtime.     [provider]  Multiple Vitamins-Minerals (PRESERVISION AREDS) CAPS Take 1 capsule by mouth in the morning and at bedtime.    [provider]  [facility] powder Apply 1 application topically as needed (rash under breasts).  03/19/18    [provider]  Potassium Chloride ER 20 MEQ TBCR TAKE 1 TABLET BY MOUTH 3 TIMES A DAY WITH MEALS; **MUST CALL MD FOR APPOINTMENT FOR REFILLS 04/07/23   Jacklynn Ganong, FNP  rosuvastatin (CRESTOR) 5 MG tablet Take 5 mg by mouth daily. 12/23/17   [provider]  spironolactone (ALDACTONE) 25 MG tablet Take 0.5 tablets (12.5 mg total) by mouth daily. 11/22/21   Camnitz, Andree Coss, MD  torsemide (DEMADEX) 20 MG tablet TAKE THREE TABLETS BY MOUTH TWICE DAILY **NEED OFFICE VISIT** 12/24/22   Laurey Morale, MD  triamcinolone cream (KENALOG) 0.1 % Apply 1 application topically daily as needed (rash). 11/14/19   [provider]  zolpidem (AMBIEN) 10 MG tablet Take 10 mg by mouth at bedtime. 12/28/20   [provider]    Physical Exam: Vitals:   04/27/23 1447 04/27/23 1500  04/27/23 1800 04/27/23 1900  BP:  123/75 108/81   Pulse:  91 93   Resp:  20 19   Temp:  (!) 97.5 F (36.4 C)  97.9 F (36.6 C)  TempSrc:  Oral  Oral  SpO2:  100% 96%   Weight: (!) 158.8 kg     Height: 5\' 6"  (1.676 m)      Constitutional: Ill appearing Respiratory: clear to auscultation bilaterally, no wheezing, no crackles. Normal respiratory effort. No accessory muscle use.  Cardiovascular: Regular rate and rhythm, no murmurs / rubs / gallops. 3+ BLE extremity edema. 2+ pedal pulses. No carotid bruits.  Abdomen: no tenderness, no masses palpated. No hepatosplenomegaly. Bowel sounds positive.  Skin:  Neurologic: CN 2-12 grossly intact. Sensation intact, DTR normal. Strength 5/5 in all 4.  Psychiatric: Still having intermittent hallucinations  Data Reviewed:    Labs on Admission: I have personally reviewed following labs and imaging studies  CBC: Recent Labs  Lab 04/27/23 1519  WBC 10.9*  NEUTROABS 10.2*  HGB 10.0*  HCT 34.3*  MCV 96.1  PLT 199   Basic Metabolic Panel: Recent Labs  Lab 04/27/23 1519  NA 143  K 4.4  CL 113*  CO2 20*  GLUCOSE 142*  BUN 36*   CREATININE 1.31*  CALCIUM 9.1   GFR: Estimated Creatinine Clearance: 57.2 mL/min (A) (by C-G formula based on SCr of 1.31 mg/dL (H)). Liver Function Tests: Recent Labs  Lab 04/27/23 1519  AST 21  ALT 20  ALKPHOS 62  BILITOT 1.8*  PROT 8.0  ALBUMIN 3.6   No results for input(s): "LIPASE", "AMYLASE" in the last 168 hours. Recent Labs  Lab 04/27/23 1642  AMMONIA 27   Coagulation Profile: No results for input(s): "INR", "PROTIME" in the last 168 hours. Cardiac Enzymes: No results for input(s): "CKTOTAL", "CKMB", "CKMBINDEX", "TROPONINI" in the last 168 hours. BNP (last 3 results) No results for input(s): "PROBNP" in the last 8760 hours. HbA1C: No results for input(s): "HGBA1C" in the last 72 hours. CBG: Recent Labs  Lab 04/27/23 1652  GLUCAP 127*   Lipid Profile: No results for input(s): "CHOL", "HDL", "LDLCALC", "TRIG", "CHOLHDL", "LDLDIRECT" in the last 72 hours. Thyroid Function Tests: No results for input(s): "TSH", "T4TOTAL", "FREET4", "T3FREE", "THYROIDAB" in the last 72 hours. Anemia Panel: No results for input(s): "VITAMINB12", "FOLATE", "FERRITIN", "TIBC", "IRON", "RETICCTPCT" in the last 72 hours. Urine analysis:    Component Value Date/Time   COLORURINE YELLOW 04/27/2023 1605   APPEARANCEUR CLEAR 04/27/2023 1605   LABSPEC 1.027 04/27/2023 1605   PHURINE 5.0 04/27/2023 1605   GLUCOSEU 150 (A) 04/27/2023 1605   HGBUR NEGATIVE 04/27/2023 1605   BILIRUBINUR NEGATIVE 04/27/2023 1605   KETONESUR NEGATIVE 04/27/2023 1605   PROTEINUR 30 (A) 04/27/2023 1605   NITRITE NEGATIVE 04/27/2023 1605   LEUKOCYTESUR NEGATIVE 04/27/2023 1605    Radiological Exams on Admission: CT Head Wo Contrast  Result Date: 04/27/2023 CLINICAL DATA:  Mental status change EXAM: CT HEAD WITHOUT CONTRAST TECHNIQUE: Contiguous axial images were obtained from the base of the skull through the vertex without intravenous contrast. RADIATION DOSE REDUCTION: This exam was performed  according to the departmental dose-optimization program which includes automated exposure control, adjustment of the mA and/or kV according to patient size and/or use of iterative reconstruction technique. COMPARISON:  None Available. FINDINGS: Brain: No evidence of acute infarction, hemorrhage, hydrocephalus, extra-axial collection or mass lesion/mass effect. Vascular: Atherosclerotic calcifications are present within the cavernous internal carotid arteries. Skull: Normal. Negative for fracture or focal  lesion. Sinuses/Orbits: No acute finding. Other: None. IMPRESSION: No acute intracranial abnormality. Electronically Signed   By: Darliss Cheney M.D.   On: 04/27/2023 18:37   DG Chest Portable 1 View  Result Date: 04/27/2023 CLINICAL DATA:  Bilateral lower extremity edema. EXAM: PORTABLE CHEST 1 VIEW COMPARISON:  April 20, 2021. FINDINGS: Stable cardiomegaly. Both lungs are clear. The visualized skeletal structures are unremarkable. IMPRESSION: No active disease. Electronically Signed   By: Lupita Raider M.D.   On: 04/27/2023 18:25    EKG: Independently reviewed.   Assessment and Plan: * Acute metabolic encephalopathy Hallucinations, right now working diagnosis is delirium secondary to cellulitis.  Cellulitis Cellulitis of BLE, open wounds, wound on RLE is draining Rocephin BCx Wound care consult WBC 10k, no other SIRS Getting CT of BLE to make sure no abscess, etc.  Pressure injury of skin Wound care consult.  Acute on chronic diastolic heart failure (HCC) Due to missing lasix for ~past 6 days. CHF pathway 2D echo Tele monitor Cont home aldactone Lasix 40mg  x1 in ED then 40mg  IV daily Strict intake and output Daily BMP  Persistent atrial fibrillation (HCC) Tele monitor Home med rec pending Resume AC Resume home rate/rhythm control  DMII (diabetes mellitus, type 2) (HCC) Home med rec pending Sensitive SSI Q4H for the moment      Advance Care Planning:   Code Status:  Full Code  Consults: None  Family Communication: No family in room, EDP d/w daughter  Severity of Illness: The appropriate patient status for this patient is OBSERVATION. Observation status is judged to be reasonable and necessary in order to provide the required intensity of service to ensure the patient's safety. The patient's presenting symptoms, physical exam findings, and initial radiographic and laboratory data in the context of their medical condition is felt to place them at decreased risk for further clinical deterioration. Furthermore, it is anticipated that the patient will be medically stable for discharge from the hospital within 2 midnights of admission.   Author: Hillary Bow., DO 04/27/2023 9:16 PM  For on call review [email or web address].

## 2023-04-27 NOTE — ED Triage Notes (Addendum)
Patient brought in by EMS after calling her PCP and not knowing why she called them. Per EMS, patient is hallucinating and has been having falls. Per EMS patient was found sitting in her recliner for the past 3 days, covered in urine. Pt also reports ulcers to bilateral legs with pitting edema. Pt also reports vaginal bleeding, saturated pads noted in home by EMS.

## 2023-04-27 NOTE — Assessment & Plan Note (Signed)
Hallucinations, right now working diagnosis is delirium secondary to cellulitis.

## 2023-04-27 NOTE — Assessment & Plan Note (Signed)
-  Wound care consult

## 2023-04-27 NOTE — Assessment & Plan Note (Addendum)
Tele monitor Home med rec pending Resume AC Resume home rate/rhythm control

## 2023-04-27 NOTE — ED Notes (Signed)
ED TO INPATIENT HANDOFF REPORT  Name/Age/Gender Shahana [name] 77 y.o. female  Code Status    Code Status Orders  (From admission, onward)           Start     Ordered   04/27/23 2057  Full code  Continuous       Question:  By:  Answer:  Default: patient does not have capacity for decision making, no surrogate or prior directive available   04/27/23 2058           Code Status History     Date Active Date Inactive Code Status Order ID Comments User Context   04/20/2021 2144 04/26/2021 1720 Full Code [id number]  Synetta Fail, MD ED   01/28/2021 1619 01/31/2021 2324 Full Code [id number]  Danice Goltz, PA Inpatient   05/04/2020 1728 05/05/2020 0038 Full Code [id number]  Regan Lemming, MD Inpatient   11/24/2019 1508 11/30/2019 2231 Full Code [id number]  Sherald Hess, NP Inpatient   09/13/2016 2031 09/18/2016 2206 Full Code [id number]  Jackie Plum, MD Inpatient   09/13/2016 2031 09/13/2016 2031 Full Code [id number]  Jackie Plum, MD Inpatient       Home/SNF/Other Home   Chief Complaint Acute on chronic diastolic CHF (congestive heart failure) (HCC) [I50.33]  Level of Care/Admitting Diagnosis ED Disposition     ED Disposition  Admit   Condition  --   Comment  Hospital Area: [facility] [[id number]]  Level of Care: Telemetry [5]  Admit to tele based on following criteria: Complex arrhythmia (Bradycardia/Tachycardia)  May place patient in observation at [facility] or [facility] if equivalent level of care is available:: No  Covid Evaluation: Asymptomatic - no recent exposure (last 10 days) testing not required  Diagnosis: Acute on chronic diastolic CHF (congestive heart failure) [facility]) [[id number]]  Admitting Physician: Hillary Bow [phone]  Attending Physician: Hillary Bow [phone]          Medical History Past Medical History:  Diagnosis Date   Atrial fibrillation (HCC)    Diabetes mellitus without complication (HCC)     Glaucoma    BILATERAL   Hypertension    Thyroid disease    Volume overload 11/2019    Allergies Allergies  Allergen Reactions   Penicillins Other (See Comments)    Possibility; father and brother allergic (FAMILY ALLERGY)   Azithromycin Hives, Rash and Other (See Comments)    Severe rash    IV Location/Drains/Wounds Patient Lines/Drains/Airways Status     Active Line/Drains/Airways     Name Placement date Placement time Site Days   Peripheral IV 04/27/23 20 G 1" Anterior;Distal;Right;Upper Arm 04/27/23  1646  Arm  less than 1   Peripheral IV 04/27/23 18 G 1" Anterior;Distal;Left;Upper Arm 04/27/23  1432  Arm  less than 1   Pressure Injury 04/21/21 Buttocks Bilateral Stage 2 -  Partial thickness loss of dermis presenting as a shallow open injury with a red, pink wound bed without slough. 04/21/21  1625  -- 736   Wound / Incision (Open or Dehisced) 04/21/21 Non-pressure wound Labia Right 04/21/21  1647  Labia  736   Wound / Incision (Open or Dehisced) 04/21/21 Diabetic ulcer Pretibial Proximal;Right weeping 04/21/21  1648  Pretibial  736            Labs/Imaging Results for orders placed or performed during the hospital encounter of 04/27/23 (from the past 48 hour(s))  Comprehensive metabolic panel     Status: Abnormal   Collection  Time: 04/27/23  3:19 PM  Result Value Ref Range   Sodium 143 135 - 145 mmol/L   Potassium 4.4 3.5 - 5.1 mmol/L   Chloride 113 (H) 98 - 111 mmol/L   CO2 20 (L) 22 - 32 mmol/L   Glucose, Bld 142 (H) 70 - 99 mg/dL    Comment: Glucose reference range applies only to samples taken after fasting for at least 8 hours.   BUN 36 (H) 8 - 23 mg/dL   Creatinine, Ser 1.61 (H) 0.44 - 1.00 mg/dL   Calcium 9.1 8.9 - 09.6 mg/dL   Total Protein 8.0 6.5 - 8.1 g/dL   Albumin 3.6 3.5 - 5.0 g/dL   AST 21 15 - 41 U/L   ALT 20 0 - 44 U/L   Alkaline Phosphatase 62 38 - 126 U/L   Total Bilirubin 1.8 (H) 0.3 - 1.2 mg/dL   GFR, Estimated 42 (L) >60 mL/min     Comment: (NOTE) Calculated using the CKD-EPI Creatinine Equation (2021)    Anion gap 10 5 - 15    Comment: Performed at [facility], [address], [place], [place] [address]  CBC with Differential     Status: Abnormal   Collection Time: 04/27/23  3:19 PM  Result Value Ref Range   WBC 10.9 (H) 4.0 - 10.5 K/uL   RBC 3.57 (L) 3.87 - 5.11 MIL/uL   Hemoglobin 10.0 (L) 12.0 - 15.0 g/dL   HCT 98.1 (L) 19.1 - 47.8 %   MCV 96.1 80.0 - 100.0 fL   MCH 28.0 26.0 - 34.0 pg   MCHC 29.2 (L) 30.0 - 36.0 g/dL   RDW 29.5 (H) 62.1 - 30.8 %   Platelets 199 150 - 400 K/uL   nRBC 0.0 0.0 - 0.2 %   Neutrophils Relative % 92 %   Neutro Abs 10.2 (H) 1.7 - 7.7 K/uL   Lymphocytes Relative 3 %   Lymphs Abs 0.3 (L) 0.7 - 4.0 K/uL   Monocytes Relative 4 %   Monocytes Absolute 0.4 0.1 - 1.0 K/uL   Eosinophils Relative 0 %   Eosinophils Absolute 0.0 0.0 - 0.5 K/uL   Basophils Relative 0 %   Basophils Absolute 0.0 0.0 - 0.1 K/uL   Immature Granulocytes 1 %   Abs Immature Granulocytes 0.06 0.00 - 0.07 K/uL    Comment: Performed at [facility], [address], [place], [place] [address]  Brain natriuretic peptide     Status: Abnormal   Collection Time: 04/27/23  3:19 PM  Result Value Ref Range   B Natriuretic Peptide 314.2 (H) 0.0 - 100.0 pg/mL    Comment: Performed at [facility], [address], [place], [place] [address]  I-Stat CG4 Lactic Acid     Status: None   Collection Time: 04/27/23  3:37 PM  Result Value Ref Range   Lactic Acid, Venous 1.2 0.5 - 1.9 mmol/L  Rapid urine drug screen (hospital performed)     Status: None   Collection Time: 04/27/23  3:42 PM  Result Value Ref Range   Opiates NONE DETECTED NONE DETECTED   Cocaine NONE DETECTED NONE DETECTED   Benzodiazepines NONE DETECTED NONE DETECTED   Amphetamines NONE DETECTED NONE DETECTED   Tetrahydrocannabinol NONE DETECTED NONE DETECTED   Barbiturates NONE DETECTED NONE DETECTED     Comment: (NOTE) DRUG SCREEN FOR MEDICAL PURPOSES ONLY.  IF CONFIRMATION IS NEEDED FOR ANY PURPOSE, NOTIFY LAB WITHIN 5 DAYS.  LOWEST DETECTABLE  LIMITS FOR URINE DRUG SCREEN Drug Class                     Cutoff (ng/mL) Amphetamine and metabolites    1000 Barbiturate and metabolites    200 Benzodiazepine                 200 Opiates and metabolites        300 Cocaine and metabolites        300 THC                            50 Performed at [facility], [address], [place], [place] [address]   Urinalysis, Routine w reflex microscopic -Urine, Catheterized     Status: Abnormal   Collection Time: 04/27/23  4:05 PM  Result Value Ref Range   Color, Urine YELLOW YELLOW   APPearance CLEAR CLEAR   Specific Gravity, Urine 1.027 1.005 - 1.030   pH 5.0 5.0 - 8.0   Glucose, UA 150 (A) NEGATIVE mg/dL   Hgb urine dipstick NEGATIVE NEGATIVE   Bilirubin Urine NEGATIVE NEGATIVE   Ketones, ur NEGATIVE NEGATIVE mg/dL   Protein, ur 30 (A) NEGATIVE mg/dL   Nitrite NEGATIVE NEGATIVE   Leukocytes,Ua NEGATIVE NEGATIVE   RBC / HPF 0-5 0 - 5 RBC/hpf   WBC, UA 0-5 0 - 5 WBC/hpf   Bacteria, UA RARE (A) NONE SEEN   Squamous Epithelial / HPF 0-5 0 - 5 /HPF   Mucus PRESENT    Hyaline Casts, UA PRESENT     Comment: Performed at [facility], [address], [place], [place] [address]  Blood culture (routine x 2)     Status: None (Preliminary result)   Collection Time: 04/27/23  4:31 PM   Specimen: BLOOD RIGHT HAND  Result Value Ref Range   Specimen Description      BLOOD RIGHT HAND Performed at [facility], [address], [place], [place] [address]    Special Requests      BOTTLES DRAWN AEROBIC AND ANAEROBIC Blood Culture adequate volume Performed at [facility], [address] [place], [place] [address]    Culture PENDING    Report Status PENDING   Ammonia     Status: None   Collection Time: 04/27/23  4:42 PM  Result  Value Ref Range   Ammonia 27 9 - 35 umol/L    Comment: Performed at [facility], [address] [place], [place] [address]  Ethanol     Status: None   Collection Time: 04/27/23  4:42 PM  Result Value Ref Range   Alcohol, Ethyl (B) <10 <10 mg/dL    Comment: (NOTE) Lowest detectable limit for serum alcohol is 10 mg/dL.  For medical purposes only. Performed at [facility], [address], [place], [place] [address]   POC CBG, ED     Status: Abnormal   Collection Time: 04/27/23  4:52 PM  Result Value Ref Range   Glucose-Capillary 127 (H) 70 - 99 mg/dL    Comment: Glucose reference range applies only to samples taken after fasting for at least 8 hours.  I-Stat CG4 Lactic Acid     Status: None   Collection Time: 04/27/23  5:50 PM  Result Value Ref Range   Lactic Acid, Venous 1.1 0.5 - 1.9 mmol/L   CT Head Wo Contrast  Result Date: 04/27/2023 CLINICAL  DATA:  Mental status change EXAM: CT HEAD WITHOUT CONTRAST TECHNIQUE: Contiguous axial images were obtained from the base of the skull through the vertex without intravenous contrast. RADIATION DOSE REDUCTION: This exam was performed according to the departmental dose-optimization program which includes automated exposure control, adjustment of the mA and/or kV according to patient size and/or use of iterative reconstruction technique. COMPARISON:  None Available. FINDINGS: Brain: No evidence of acute infarction, hemorrhage, hydrocephalus, extra-axial collection or mass lesion/mass effect. Vascular: Atherosclerotic calcifications are present within the cavernous internal carotid arteries. Skull: Normal. Negative for fracture or focal lesion. Sinuses/Orbits: No acute finding. Other: None. IMPRESSION: No acute intracranial abnormality. Electronically Signed   By: Darliss Cheney M.D.   On: 04/27/2023 18:37   DG Chest Portable 1 View  Result Date: 04/27/2023 CLINICAL DATA:  Bilateral lower extremity edema.  EXAM: PORTABLE CHEST 1 VIEW COMPARISON:  April 20, 2021. FINDINGS: Stable cardiomegaly. Both lungs are clear. The visualized skeletal structures are unremarkable. IMPRESSION: No active disease. Electronically Signed   By: Lupita Raider M.D.   On: 04/27/2023 18:25    Pending Labs Unresulted Labs (From admission, onward)     Start     Ordered   04/28/23 0500  Basic metabolic panel  Daily,   R     Comments: As Scheduled for 5 days    04/27/23 2058   04/27/23 2131  Hemoglobin A1c  Once,   R       Comments: To assess prior glycemic control    04/27/23 2130   04/27/23 1532  Blood culture (routine x 2)  BLOOD CULTURE X 2,   R (with STAT occurrences)      04/27/23 1531            Vitals/Pain Today's Vitals   04/27/23 1447 04/27/23 1500 04/27/23 1800 04/27/23 1900  BP:  123/75 108/81   Pulse:  91 93   Resp:  20 19   Temp:  (!) 97.5 F (36.4 C)  97.9 F (36.6 C)  TempSrc:  Oral  Oral  SpO2:  100% 96%   Weight: (!) 350 lb (158.8 kg)     Height: 5\' 6"  (1.676 m)     PainSc: 3        Isolation Precautions No active isolations  Medications Medications  sodium chloride flush (NS) 0.9 % injection 3 mL (has no administration in time range)  sodium chloride flush (NS) 0.9 % injection 3 mL (has no administration in time range)  0.9 %  sodium chloride infusion (has no administration in time range)  acetaminophen (TYLENOL) tablet 650 mg (has no administration in time range)  ondansetron (ZOFRAN) injection 4 mg (has no administration in time range)  furosemide (LASIX) injection 40 mg (has no administration in time range)  cefTRIAXone (ROCEPHIN) 2 g in sodium chloride 0.9 % 100 mL IVPB (has no administration in time range)  insulin aspart (novoLOG) injection 0-9 Units (has no administration in time range)  furosemide (LASIX) injection 40 mg (40 mg Intravenous Given 04/27/23 1646)  cefTRIAXone (ROCEPHIN) 1 g in sodium chloride 0.9 % 100 mL IVPB (0 g Intravenous Stopped 04/27/23 1825)     Mobility walks with device

## 2023-04-27 NOTE — Assessment & Plan Note (Deleted)
SSI sensitive Q4H for the moment

## 2023-04-27 NOTE — Assessment & Plan Note (Addendum)
Cellulitis of BLE, open wounds, wound on RLE is draining Rocephin BCx Wound care consult WBC 10k, no other SIRS Getting CT of BLE to make sure no abscess, etc.

## 2023-04-27 NOTE — Assessment & Plan Note (Addendum)
Due to missing lasix for ~past 6 days. CHF pathway 2D echo Tele monitor Cont home aldactone Lasix 40mg  x1 in ED then 40mg  IV daily Strict intake and output Daily BMP

## 2023-04-27 NOTE — ED Provider Notes (Signed)
[place] EMERGENCY DEPARTMENT AT [facility] Provider Note   CSN: [id number] Arrival date & time: 04/27/23  1432     History  Chief Complaint  Patient presents with   Fall   Leg Swelling    Hannah [name] is a 77 y.o. female with medical history of atrial fibrillation on blood thinning medication, diabetes, glucoma, hypertension, thyroid disease, CHF.  Patient presents to the ED for evaluation of fall, altered status.  Per triage note, the patient was brought in by EMS after calling her PCP on 9 why should call them.  EMS noted that the patient was hallucinating has been having frequent falls recently.  Per EMS, patient was found sitting in her recliner for the past 3 days covered in urine.  Patient also has ulcers to bilateral lower extremities with pitting edema.  On my examination, the patient alert and orient x 4 but actively hallucinating.  Collected collateral from patient daughter.  Patient daughter reports that the patient has been unable to get out of the home for 1 year secondary to her "bad knees" and ulcers on her legs.  Patient daughter reports that the patient is fallen 4 times in the last week.  Patient daughter states that she is seeing things that are not there.  Patient daughter states that she typically walks with a walker but is having trouble getting up on her own recently.  The last 2 days the patient is unable to get out of her chair and her legs are very weak.  The patient daughter denies any fevers at home but states that the hallucinations began when she started falling.  She is also not taking medication to include Lasix and blood thinning medication.   Fall       Home Medications Prior to Admission medications   Medication Sig Start Date End Date Taking? Authorizing Provider  acetaminophen (TYLENOL) 500 MG tablet Take 500 mg by mouth every 6 (six) hours as needed for moderate pain.    [provider]  apixaban (ELIQUIS) 5 MG TABS  tablet Take 1 tablet (5 mg total) by mouth 2 (two) times daily. 10/07/21   Camnitz, Will Daphine Deutscher, MD  busPIRone (BUSPAR) 15 MG tablet TAKE 1 TABLET THREE TIMES A DAY 12/24/17   Eksir, Bo Mcclintock, MD  diclofenac Sodium (VOLTAREN) 1 % GEL Apply 2 g topically 4 (four) times daily. Patient taking differently: Apply 2 g topically 4 (four) times daily as needed (pain). 03/14/21   Cristopher Peru, PA-C  dofetilide (TIKOSYN) 250 MCG capsule Take 1 capsule (250 mcg total) by mouth 2 (two) times daily. 05/15/21   Camnitz, Andree Coss, MD  DULoxetine (CYMBALTA) 30 MG capsule Take 3 capsules (90 mg total) by mouth daily. 11/05/17   Eksir, Bo Mcclintock, MD  empagliflozin (JARDIANCE) 10 MG TABS tablet Take 1 tablet (10 mg total) by mouth daily before breakfast. 02/15/21   Milford, Anderson Malta, FNP  esomeprazole (NEXIUM) 40 MG capsule Take 40 mg by mouth as needed (indigestion/heartburn).  11/14/19   [provider]  ferrous sulfate 325 (65 FE) MG tablet Take 1 tablet (325 mg total) by mouth 2 (two) times daily. 04/26/21 04/27/23  Dorcas Carrow, MD  KLOR-CON M20 20 MEQ tablet TAKE 1 TO 2 TABLETS BY MOUTH 3 TIMES DAILY. (TAKE 40 MEQ IN MORNING AND 20 MEQ IN EVENING).**NEED FOLLOW UP FOR MORE REFILLS** 09/15/22   Milford, Anderson Malta, FNP  levocetirizine (XYZAL) 5 MG tablet Take 5 mg by mouth at bedtime.  [provider]  levothyroxine (SYNTHROID) 125 MCG tablet Take 1 tablet (125 mcg total) by mouth daily. 05/15/21 04/27/23  Camnitz, Andree Coss, MD  lidocaine (LIDODERM) 5 % Place 1 patch onto the skin daily. Remove & Discard patch within 12 hours or as directed by MD 03/14/21   Cristopher Peru, PA-C  Misc. Devices (BARIATRIC ROLLATOR) MISC For home use 11/30/19   Robbie Lis M, PA-C  montelukast (SINGULAIR) 10 MG tablet Take 10 mg by mouth at bedtime.     [provider]  Multiple Vitamins-Minerals (PRESERVISION AREDS) CAPS Take 1 capsule by mouth in the morning and at bedtime.    [provider]  [facility] powder Apply 1 application topically as needed (rash under breasts).  03/19/18   [provider]  Potassium Chloride ER 20 MEQ TBCR TAKE 1 TABLET BY MOUTH 3 TIMES A DAY WITH MEALS; **MUST CALL MD FOR APPOINTMENT FOR REFILLS 04/07/23   Jacklynn Ganong, FNP  rosuvastatin (CRESTOR) 5 MG tablet Take 5 mg by mouth daily. 12/23/17   [provider]  spironolactone (ALDACTONE) 25 MG tablet Take 0.5 tablets (12.5 mg total) by mouth daily. 11/22/21   Camnitz, Andree Coss, MD  torsemide (DEMADEX) 20 MG tablet TAKE THREE TABLETS BY MOUTH TWICE DAILY **NEED OFFICE VISIT** 12/24/22   Laurey Morale, MD  triamcinolone cream (KENALOG) 0.1 % Apply 1 application topically daily as needed (rash). 11/14/19   [provider]  zolpidem (AMBIEN) 10 MG tablet Take 10 mg by mouth at bedtime. 12/28/20   [provider]      Allergies    Penicillins and Azithromycin    Review of Systems   Review of Systems  Unable to perform ROS: Mental status change (Level 5 caveat)  All other systems reviewed and are negative.   Physical Exam Updated Vital Signs BP 108/81   Pulse 93   Temp 97.9 F (36.6 C) (Oral)   Resp 19   Ht 5\' 6"  (1.676 m)   Wt (!) 158.8 kg   SpO2 96%   BMI 56.49 kg/m  Physical Exam Vitals and nursing note reviewed.  Constitutional:      General: She is not in acute distress.    Appearance: Normal appearance. She is ill-appearing and toxic-appearing. She is not diaphoretic.  HENT:     Head: Normocephalic and atraumatic.     Nose: Nose normal.     Mouth/Throat:     Mouth: Mucous membranes are moist.     Pharynx: Oropharynx is clear.  Eyes:     Extraocular Movements: Extraocular movements intact.     Conjunctiva/sclera: Conjunctivae normal.     Pupils: Pupils are equal, round, and reactive to light.  Cardiovascular:     Rate and Rhythm: Normal rate and regular rhythm.  Pulmonary:     Effort: Pulmonary effort is normal.     Breath  sounds: Normal breath sounds.  Abdominal:     General: Abdomen is flat. Bowel sounds are normal.     Palpations: Abdomen is soft.     Tenderness: There is no abdominal tenderness.  Musculoskeletal:     Cervical back: Normal range of motion and neck supple. No tenderness.     Right lower leg: 3+ Pitting Edema present.     Left lower leg: 3+ Pitting Edema present.  Skin:    General: Skin is warm and dry.     Capillary Refill: Capillary refill takes less than 2 seconds.     Comments:  Areas of erythema, cellulitis anterior bilateral lower extremities  Neurological:     Mental Status: She is alert and oriented to person, place, and time.     GCS: GCS eye subscore is 4. GCS verbal subscore is 5. GCS motor subscore is 6.     Cranial Nerves: Cranial nerves 2-12 are intact. No cranial nerve deficit.     Sensory: Sensation is intact. No sensory deficit.     Motor: Motor function is intact. No weakness.     Coordination: Coordination is intact. Heel to Shin Test normal.       ED Results / Procedures / Treatments   Labs (all labs ordered are listed, but only abnormal results are displayed) Labs Reviewed  COMPREHENSIVE METABOLIC PANEL - Abnormal; Notable for the following components:      Result Value   Chloride 113 (*)    CO2 20 (*)    Glucose, Bld 142 (*)    BUN 36 (*)    Creatinine, Ser 1.31 (*)    Total Bilirubin 1.8 (*)    GFR, Estimated 42 (*)    All other components within normal limits  CBC WITH DIFFERENTIAL/PLATELET - Abnormal; Notable for the following components:   WBC 10.9 (*)    RBC 3.57 (*)    Hemoglobin 10.0 (*)    HCT 34.3 (*)    MCHC 29.2 (*)    RDW 15.9 (*)    Neutro Abs 10.2 (*)    Lymphs Abs 0.3 (*)    All other components within normal limits  URINALYSIS, ROUTINE W REFLEX MICROSCOPIC - Abnormal; Notable for the following components:   Glucose, UA 150 (*)    Protein, ur 30 (*)    Bacteria, UA RARE (*)    All other components within normal limits  BRAIN  NATRIURETIC PEPTIDE - Abnormal; Notable for the following components:   B Natriuretic Peptide 314.2 (*)    All other components within normal limits  CBG MONITORING, ED - Abnormal; Notable for the following components:   Glucose-Capillary 127 (*)    All other components within normal limits  CULTURE, BLOOD (ROUTINE X 2)  CULTURE, BLOOD (ROUTINE X 2)  AMMONIA  ETHANOL  RAPID URINE DRUG SCREEN, HOSP PERFORMED  BASIC METABOLIC PANEL  I-STAT CG4 LACTIC ACID, ED  I-STAT CG4 LACTIC ACID, ED  I-STAT CG4 LACTIC ACID, ED    EKG EKG Interpretation Date/Time:  Monday April 27 2023 15:52:00 EDT Ventricular Rate:  87 PR Interval:    QRS Duration:  119 QT Interval:  383 QTC Calculation: 461 R Axis:   60  Text Interpretation: Atrial fibrillation Incomplete right bundle branch block Low voltage, extremity and precordial leads afib replacing sinus on prior ecg 7/22 Confirmed by Meridee Score [id number]) on 04/27/2023 3:58:57 PM  Radiology CT Head Wo Contrast  Result Date: 04/27/2023 CLINICAL DATA:  Mental status change EXAM: CT HEAD WITHOUT CONTRAST TECHNIQUE: Contiguous axial images were obtained from the base of the skull through the vertex without intravenous contrast. RADIATION DOSE REDUCTION: This exam was performed according to the departmental dose-optimization program which includes automated exposure control, adjustment of the mA and/or kV according to patient size and/or use of iterative reconstruction technique. COMPARISON:  None Available. FINDINGS: Brain: No evidence of acute infarction, hemorrhage, hydrocephalus, extra-axial collection or mass lesion/mass effect. Vascular: Atherosclerotic calcifications are present within the cavernous internal carotid arteries. Skull: Normal. Negative for fracture or focal lesion. Sinuses/Orbits: No acute finding. Other: None. IMPRESSION: No acute intracranial abnormality. Electronically Signed  By: Darliss Cheney M.D.   On: 04/27/2023 18:37   DG  Chest Portable 1 View  Result Date: 04/27/2023 CLINICAL DATA:  Bilateral lower extremity edema. EXAM: PORTABLE CHEST 1 VIEW COMPARISON:  April 20, 2021. FINDINGS: Stable cardiomegaly. Both lungs are clear. The visualized skeletal structures are unremarkable. IMPRESSION: No active disease. Electronically Signed   By: Lupita Raider M.D.   On: 04/27/2023 18:25    Procedures Procedures   Medications Ordered in ED Medications  sodium chloride flush (NS) 0.9 % injection 3 mL (has no administration in time range)  sodium chloride flush (NS) 0.9 % injection 3 mL (has no administration in time range)  0.9 %  sodium chloride infusion (has no administration in time range)  acetaminophen (TYLENOL) tablet 650 mg (has no administration in time range)  ondansetron (ZOFRAN) injection 4 mg (has no administration in time range)  furosemide (LASIX) injection 40 mg (has no administration in time range)  furosemide (LASIX) injection 40 mg (40 mg Intravenous Given 04/27/23 1646)  cefTRIAXone (ROCEPHIN) 1 g in sodium chloride 0.9 % 100 mL IVPB (0 g Intravenous Stopped 04/27/23 1825)    ED Course/ Medical Decision Making/ A&P Clinical Course as of 04/27/23 2058  Mon Apr 27, 2023  6730 77 year old female brought in by EMS for erratic behavior for ambulation.  She is on blood thinners.  She is awake alert with pressured speech.  Getting lab work and imaging.  Anticipate will need admission to the hospital. [MB]  1544 Fallen 4 times in the last week. Seeing things that are not there. Walks with walker but having trouble getting up on her own recently. Last two days not able to get out of her chair. Legs very weak. No fevers. Hallucinations began when she started falling. Not taking meds.  [CG]  1546 Not been able to get out of the house for 1 year so no doctors will refill her meds. Bad knees and ulcers on her legs.  [CG]  2022 Admit for encephalopathy of unclear etiology possibly secondary to cellulitis. Rapid  pressured speech.  [CG]    Clinical Course User Index [CG] Al Decant, PA-C [MB] Terrilee Files, MD    Medical Decision Making Amount and/or Complexity of Data Reviewed Labs: ordered. Radiology: ordered.  Risk Prescription drug management. Decision regarding hospitalization.   77 year old female presents to the ED for evaluation.  Please see HPI for further details.  On examination patient is afebrile and nontachycardic.  Her lung sounds are clear bilaterally, she is not hypoxic.  Abdomen is soft and compressible throughout.  Neurological examination shows the patient is alert and oriented, no focal neurodeficits noted.  She is actively hallucinating, states that she sees things crawling on the wall.  Reporting stories about her dead husband.  Patient has extensive areas of cellulitis to bilateral lower extremities with open wounds that are actively draining.  CBC with leukocytosis 10.9, hemoglobin baseline.  CMP with baseline creatinine, elevated bilirubin 1.8.  Urinalysis unremarkable.  Rapid urine drug screen negative for all.  Ammonia WNL.  Alcohol undetectable.  Lactic not elevated at 1.2.  CBG 127.  BNP 314.2  Chest x-ray unremarkable.  CT head without contrast unremarkable as well.  Patient provided 4 mg of Lasix for volume overload, diuresing well.  Patient also provided 2 g IV Rocephin for cellulitis.  Patient will need admission to the hospital for encephalopathy possibly as a result of her cellulitis.  Discussed with Dr. Julian Reil, [facility] hospitalist, who has  agreed to admit the patient.   Final Clinical Impression(s) / ED Diagnoses Final diagnoses:  Leg swelling  Cellulitis of lower extremity, unspecified laterality    Rx / DC Orders ED Discharge Orders     None         Clent Ridges 04/27/23 2058    Charlynne Pander, MD 04/27/23 [phone]

## 2023-04-28 ENCOUNTER — Observation Stay (HOSPITAL_COMMUNITY): Payer: 59

## 2023-04-28 DIAGNOSIS — Z7901 Long term (current) use of anticoagulants: Secondary | ICD-10-CM | POA: Diagnosis not present

## 2023-04-28 DIAGNOSIS — K219 Gastro-esophageal reflux disease without esophagitis: Secondary | ICD-10-CM | POA: Diagnosis not present

## 2023-04-28 DIAGNOSIS — R600 Localized edema: Secondary | ICD-10-CM | POA: Diagnosis not present

## 2023-04-28 DIAGNOSIS — L03115 Cellulitis of right lower limb: Secondary | ICD-10-CM | POA: Diagnosis present

## 2023-04-28 DIAGNOSIS — N183 Chronic kidney disease, stage 3 unspecified: Secondary | ICD-10-CM | POA: Diagnosis not present

## 2023-04-28 DIAGNOSIS — I081 Rheumatic disorders of both mitral and tricuspid valves: Secondary | ICD-10-CM | POA: Diagnosis present

## 2023-04-28 DIAGNOSIS — D6869 Other thrombophilia: Secondary | ICD-10-CM | POA: Diagnosis not present

## 2023-04-28 DIAGNOSIS — I5031 Acute diastolic (congestive) heart failure: Secondary | ICD-10-CM

## 2023-04-28 DIAGNOSIS — I472 Ventricular tachycardia, unspecified: Secondary | ICD-10-CM | POA: Diagnosis present

## 2023-04-28 DIAGNOSIS — I130 Hypertensive heart and chronic kidney disease with heart failure and stage 1 through stage 4 chronic kidney disease, or unspecified chronic kidney disease: Secondary | ICD-10-CM | POA: Diagnosis present

## 2023-04-28 DIAGNOSIS — L03119 Cellulitis of unspecified part of limb: Secondary | ICD-10-CM | POA: Diagnosis not present

## 2023-04-28 DIAGNOSIS — E1122 Type 2 diabetes mellitus with diabetic chronic kidney disease: Secondary | ICD-10-CM | POA: Diagnosis present

## 2023-04-28 DIAGNOSIS — D638 Anemia in other chronic diseases classified elsewhere: Secondary | ICD-10-CM | POA: Diagnosis not present

## 2023-04-28 DIAGNOSIS — I2721 Secondary pulmonary arterial hypertension: Secondary | ICD-10-CM | POA: Diagnosis not present

## 2023-04-28 DIAGNOSIS — G9341 Metabolic encephalopathy: Secondary | ICD-10-CM | POA: Diagnosis present

## 2023-04-28 DIAGNOSIS — I5082 Biventricular heart failure: Secondary | ICD-10-CM | POA: Diagnosis present

## 2023-04-28 DIAGNOSIS — Z6841 Body Mass Index (BMI) 40.0 and over, adult: Secondary | ICD-10-CM | POA: Diagnosis not present

## 2023-04-28 DIAGNOSIS — M150 Primary generalized (osteo)arthritis: Secondary | ICD-10-CM | POA: Diagnosis not present

## 2023-04-28 DIAGNOSIS — M7989 Other specified soft tissue disorders: Secondary | ICD-10-CM | POA: Diagnosis present

## 2023-04-28 DIAGNOSIS — E8721 Acute metabolic acidosis: Secondary | ICD-10-CM | POA: Diagnosis present

## 2023-04-28 DIAGNOSIS — I5023 Acute on chronic systolic (congestive) heart failure: Secondary | ICD-10-CM | POA: Diagnosis not present

## 2023-04-28 DIAGNOSIS — E119 Type 2 diabetes mellitus without complications: Secondary | ICD-10-CM | POA: Diagnosis not present

## 2023-04-28 DIAGNOSIS — F419 Anxiety disorder, unspecified: Secondary | ICD-10-CM | POA: Diagnosis present

## 2023-04-28 DIAGNOSIS — R609 Edema, unspecified: Secondary | ICD-10-CM | POA: Diagnosis not present

## 2023-04-28 DIAGNOSIS — E7800 Pure hypercholesterolemia, unspecified: Secondary | ICD-10-CM | POA: Diagnosis present

## 2023-04-28 DIAGNOSIS — I5033 Acute on chronic diastolic (congestive) heart failure: Secondary | ICD-10-CM | POA: Diagnosis present

## 2023-04-28 DIAGNOSIS — I4819 Other persistent atrial fibrillation: Secondary | ICD-10-CM | POA: Diagnosis not present

## 2023-04-28 DIAGNOSIS — F32A Depression, unspecified: Secondary | ICD-10-CM | POA: Diagnosis present

## 2023-04-28 DIAGNOSIS — E039 Hypothyroidism, unspecified: Secondary | ICD-10-CM | POA: Diagnosis present

## 2023-04-28 DIAGNOSIS — I2781 Cor pulmonale (chronic): Secondary | ICD-10-CM | POA: Diagnosis not present

## 2023-04-28 DIAGNOSIS — G4733 Obstructive sleep apnea (adult) (pediatric): Secondary | ICD-10-CM | POA: Diagnosis present

## 2023-04-28 DIAGNOSIS — I872 Venous insufficiency (chronic) (peripheral): Secondary | ICD-10-CM | POA: Diagnosis not present

## 2023-04-28 DIAGNOSIS — L97929 Non-pressure chronic ulcer of unspecified part of left lower leg with unspecified severity: Secondary | ICD-10-CM | POA: Diagnosis present

## 2023-04-28 DIAGNOSIS — I10 Essential (primary) hypertension: Secondary | ICD-10-CM | POA: Diagnosis not present

## 2023-04-28 DIAGNOSIS — M6281 Muscle weakness (generalized): Secondary | ICD-10-CM | POA: Diagnosis not present

## 2023-04-28 DIAGNOSIS — I480 Paroxysmal atrial fibrillation: Secondary | ICD-10-CM | POA: Diagnosis not present

## 2023-04-28 DIAGNOSIS — R2681 Unsteadiness on feet: Secondary | ICD-10-CM | POA: Diagnosis not present

## 2023-04-28 DIAGNOSIS — L8990 Pressure ulcer of unspecified site, unspecified stage: Secondary | ICD-10-CM | POA: Diagnosis not present

## 2023-04-28 DIAGNOSIS — Z743 Need for continuous supervision: Secondary | ICD-10-CM | POA: Diagnosis not present

## 2023-04-28 DIAGNOSIS — Z91A98 Caregiver's noncompliance with patient's other medical treatment and regimen for other reason: Secondary | ICD-10-CM | POA: Diagnosis not present

## 2023-04-28 DIAGNOSIS — I4891 Unspecified atrial fibrillation: Secondary | ICD-10-CM | POA: Diagnosis not present

## 2023-04-28 DIAGNOSIS — Z91199 Patient's noncompliance with other medical treatment and regimen due to unspecified reason: Secondary | ICD-10-CM | POA: Diagnosis not present

## 2023-04-28 DIAGNOSIS — R443 Hallucinations, unspecified: Secondary | ICD-10-CM | POA: Diagnosis present

## 2023-04-28 DIAGNOSIS — R531 Weakness: Secondary | ICD-10-CM | POA: Diagnosis not present

## 2023-04-28 DIAGNOSIS — N1832 Chronic kidney disease, stage 3b: Secondary | ICD-10-CM | POA: Diagnosis present

## 2023-04-28 DIAGNOSIS — L03116 Cellulitis of left lower limb: Secondary | ICD-10-CM | POA: Diagnosis present

## 2023-04-28 DIAGNOSIS — L97919 Non-pressure chronic ulcer of unspecified part of right lower leg with unspecified severity: Secondary | ICD-10-CM | POA: Diagnosis present

## 2023-04-28 DIAGNOSIS — D631 Anemia in chronic kidney disease: Secondary | ICD-10-CM | POA: Diagnosis present

## 2023-04-28 DIAGNOSIS — L0390 Cellulitis, unspecified: Secondary | ICD-10-CM | POA: Diagnosis not present

## 2023-04-28 DIAGNOSIS — I4811 Longstanding persistent atrial fibrillation: Secondary | ICD-10-CM | POA: Diagnosis present

## 2023-04-28 DIAGNOSIS — G4700 Insomnia, unspecified: Secondary | ICD-10-CM | POA: Diagnosis not present

## 2023-04-28 LAB — BASIC METABOLIC PANEL
Anion gap: 10 (ref 5–15)
BUN: 37 mg/dL — ABNORMAL HIGH (ref 8–23)
CO2: 16 mmol/L — ABNORMAL LOW (ref 22–32)
Calcium: 8.6 mg/dL — ABNORMAL LOW (ref 8.9–10.3)
Chloride: 112 mmol/L — ABNORMAL HIGH (ref 98–111)
Creatinine, Ser: 1.42 mg/dL — ABNORMAL HIGH (ref 0.44–1.00)
GFR, Estimated: 38 mL/min — ABNORMAL LOW (ref >60)
Glucose, Bld: 120 mg/dL — ABNORMAL HIGH (ref 70–99)
Potassium: 4.4 mmol/L (ref 3.5–5.1)
Sodium: 138 mmol/L (ref 135–145)

## 2023-04-28 LAB — ECHOCARDIOGRAM COMPLETE
AR max vel: 2.46 cm2
AV Area VTI: 2.38 cm2
AV Area mean vel: 2.4 cm2
AV Mean grad: 7.5 mm[Hg]
AV Peak grad: 14.7 mm[Hg]
Ao pk vel: 1.92 m/s
Area-P 1/2: 4.32 cm2
Height: 66 in
MV M vel: 4.8 m/s
MV Peak grad: 92.2 mm[Hg]
S' Lateral: 3.6 cm
Weight: 4920.67 [oz_av]

## 2023-04-28 LAB — GLUCOSE, CAPILLARY
Glucose-Capillary: 104 mg/dL — ABNORMAL HIGH (ref 70–99)
Glucose-Capillary: 105 mg/dL — ABNORMAL HIGH (ref 70–99)
Glucose-Capillary: 117 mg/dL — ABNORMAL HIGH (ref 70–99)
Glucose-Capillary: 123 mg/dL — ABNORMAL HIGH (ref 70–99)
Glucose-Capillary: 176 mg/dL — ABNORMAL HIGH (ref 70–99)

## 2023-04-28 LAB — HEMOGLOBIN A1C
Hgb A1c MFr Bld: 6.2 % — ABNORMAL HIGH (ref 4.8–5.6)
Mean Plasma Glucose: 131.24 mg/dL

## 2023-04-28 LAB — MRSA NEXT GEN BY PCR, NASAL: MRSA by PCR Next Gen: DETECTED — AB

## 2023-04-28 LAB — MAGNESIUM: Magnesium: 2.5 mg/dL — ABNORMAL HIGH (ref 1.7–2.4)

## 2023-04-28 MED ORDER — MONTELUKAST SODIUM 10 MG PO TABS
10.0000 mg | ORAL_TABLET | Freq: Every day | ORAL | Status: DC
  Administered 2023-04-28 – 2023-05-07 (×10): 10 mg via ORAL
  Filled 2023-04-28 (×10): qty 1

## 2023-04-28 MED ORDER — LEVOTHYROXINE SODIUM 25 MCG PO TABS
125.0000 ug | ORAL_TABLET | Freq: Every day | ORAL | Status: DC
  Administered 2023-04-29 – 2023-05-08 (×10): 125 ug via ORAL
  Filled 2023-04-28 (×10): qty 1

## 2023-04-28 MED ORDER — LIDOCAINE 5 % EX PTCH
1.0000 | MEDICATED_PATCH | CUTANEOUS | Status: AC
  Administered 2023-04-28 – 2023-04-29 (×2): 1 via TRANSDERMAL
  Filled 2023-04-28 (×2): qty 1

## 2023-04-28 MED ORDER — DOFETILIDE 250 MCG PO CAPS: 250.0000 ug | ORAL_CAPSULE | Freq: Two times a day (BID) | ORAL | Status: DC

## 2023-04-28 MED ORDER — ROSUVASTATIN CALCIUM 5 MG PO TABS
5.0000 mg | ORAL_TABLET | Freq: Every day | ORAL | Status: DC
  Administered 2023-04-28 – 2023-05-08 (×11): 5 mg via ORAL
  Filled 2023-04-28 (×11): qty 1

## 2023-04-28 MED ORDER — PERFLUTREN LIPID MICROSPHERE
1.0000 mL | INTRAVENOUS | Status: AC | PRN
  Administered 2023-04-28: 4 mL via INTRAVENOUS

## 2023-04-28 MED ORDER — BUSPIRONE HCL 5 MG PO TABS
15.0000 mg | ORAL_TABLET | Freq: Three times a day (TID) | ORAL | Status: DC
  Administered 2023-04-28 – 2023-05-08 (×31): 15 mg via ORAL
  Filled 2023-04-28 (×31): qty 1

## 2023-04-28 MED ORDER — FERROUS SULFATE 325 (65 FE) MG PO TABS
325.0000 mg | ORAL_TABLET | Freq: Two times a day (BID) | ORAL | Status: DC
  Administered 2023-04-28 – 2023-05-08 (×20): 325 mg via ORAL
  Filled 2023-04-28 (×20): qty 1

## 2023-04-28 MED ORDER — APIXABAN 5 MG PO TABS
5.0000 mg | ORAL_TABLET | Freq: Two times a day (BID) | ORAL | Status: DC
  Administered 2023-04-28 – 2023-05-08 (×20): 5 mg via ORAL
  Filled 2023-04-28 (×20): qty 1

## 2023-04-28 MED ORDER — DOFETILIDE 250 MCG PO CAPS
250.0000 ug | ORAL_CAPSULE | Freq: Two times a day (BID) | ORAL | Status: DC
  Filled 2023-04-28: qty 1

## 2023-04-28 MED ORDER — DULOXETINE HCL 60 MG PO CPEP
90.0000 mg | ORAL_CAPSULE | Freq: Every day | ORAL | Status: DC
  Administered 2023-04-28 – 2023-05-08 (×11): 90 mg via ORAL
  Filled 2023-04-28 (×12): qty 1

## 2023-04-28 MED ORDER — MELATONIN 5 MG PO TABS
5.0000 mg | ORAL_TABLET | Freq: Once | ORAL | Status: AC
  Administered 2023-04-28: 5 mg via ORAL
  Filled 2023-04-28: qty 1

## 2023-04-28 NOTE — Progress Notes (Signed)
PT Cancellation Note  Patient Details Name: Hannah [name] MRN: [id number] DOB: 1945-10-14   Cancelled Treatment:    Reason Eval/Treat Not Completed: Patient at procedure or test/unavailable. Pt undergoing cardiac ultrasound. PT to return later in day if schedule allows and continue to follow pt acutely.   Johnny Bridge, PT Acute Rehab   Jacqualyn Posey 04/28/2023, 11:57 AM

## 2023-04-28 NOTE — Plan of Care (Signed)
  Problem: Education: Goal: Knowledge of General Education information will improve Description: Including pain rating scale, medication(s)/side effects and non-pharmacologic comfort measures Outcome: Progressing   Problem: Pain Managment: Goal: General experience of comfort will improve Outcome: Progressing   Problem: Safety: Goal: Ability to remain free from injury will improve Outcome: Progressing   

## 2023-04-28 NOTE — Hospital Course (Signed)
Hannah [name] is a 77 y.o. female with a history of atrial fibrillation, diabetes mellitus type 2, hypertension, diastolic heart failure, morbid obesity.  Patient presented secondary to a fall and leg swelling and was found to have evidence of bilateral leg cellulitis complicated by a chronic leg wound. Empiric Ceftriaxone IV started on admission.

## 2023-04-28 NOTE — Evaluation (Signed)
Physical Therapy Evaluation Patient Details Name: Hannah [name] MRN: [id number] DOB: 08/04/1945 Today's Date: 04/28/2023  History of Present Illness  77 yo female presented to ED on 04/27/2023 due to AMS with hallucinations. Pt was found to have B LE cellulitis and metabolic encephalopathy. Pt UA ahd head CT negative for acute findings, EKG reveals A-fib and incomplete R BBB. Pt PMH includes but is not limited to: A-fib, DM II, glaucoma, HTN, thyroid dz and multiple cardioversion.  Clinical Impression   Pt admitted with above diagnosis.  Pt currently with functional limitations due to the deficits listed below (see PT Problem List). PT arrived in am and pt undergoing cardiac [place]. PT returned later in PM and pt resting in bed. Pt easily roused and agreeable to therapy, however hesitant and fearful of falling and anticipated B LE pain. Noted B LE edema, rubor and calor with bandages anterior distal portion of B LE intact at time of eval. Pt indicated feeling a little confused and stated she did not know how it happened. Pt ed on infection and report of pt not taking medications as prescribed in home setting. Pt required max A x 2 for supine to sit, mod A x 2 for sit to stand  from EOB, min A for static standing balance with excessive trunk flexion, pt required mod A x 1 and min A x 1 to complete SPT from bed to recliner, pt required total A to reposition in recliner. Pt ed provided on importance of increased OOB time. Pt demonstrated verbal understanding. Pt left seated in recliner and all needs in place, Nurse tech is aware of A x 2 for transfer tasks and present during a portion of evaluation. Pt appears to be a poor historian and no family present to relay PLOF.  Patient will benefit from continued inpatient follow up therapy, <3 hours/day. Pt will benefit from acute skilled PT to increase their independence and safety with mobility to allow discharge.         If plan is discharge home, recommend  the following: Two people to help with walking and/or transfers;A lot of help with bathing/dressing/bathroom;Assistance with cooking/housework;Direct supervision/assist for medications management;Direct supervision/assist for financial management;Assist for transportation;Help with stairs or ramp for entrance;Supervision due to cognitive status   Can travel by private vehicle        Equipment Recommendations Rolling walker (2 wheels) (TBD as pt progressess or in next venue)  Recommendations for Other Services       Functional Status Assessment Patient has had a recent decline in their functional status and demonstrates the ability to make significant improvements in function in a reasonable and predictable amount of time.     Precautions / Restrictions Precautions Precautions: Fall Restrictions Weight Bearing Restrictions: No      Mobility  Bed Mobility Overal bed mobility: Needs Assistance Bed Mobility: Supine to Sit     Supine to sit: Max assist, +2 for physical assistance, HOB elevated, +2 for safety/equipment, Used rails     General bed mobility comments: increased time, cues for technique and encouragement    Transfers Overall transfer level: Needs assistance Equipment used: Rolling walker (2 wheels) Transfers: Sit to/from Stand, Bed to chair/wheelchair/BSC Sit to Stand: Mod assist, +2 physical assistance, +2 safety/equipment, From elevated surface Stand pivot transfers: Mod assist, Min assist, +2 physical assistance, +2 safety/equipment         General transfer comment: pt required max encouragement for sit to stand from EOB, once in standing at RW pt  required min A for static standing balance with trunk flexion. pt required mod A x 2 for sit to stand and then mod A x 1 and min A x 1 to complete SPT bed to recliner with RW with step by step cues for LE placement assist for weight shift and RW manuvering    Ambulation/Gait               General Gait Details:  NT for pt and staff safety due to pain response, reported fear of falling and extensive assist for mobility  Stairs            Wheelchair Mobility     Tilt Bed    Modified Rankin (Stroke Patients Only)       Balance Overall balance assessment: Needs assistance, History of Falls Sitting-balance support: Feet supported Sitting balance-Leahy Scale: Fair     Standing balance support: Bilateral upper extremity supported, During functional activity, Reliant on assistive device for balance Standing balance-Leahy Scale: Zero                               Pertinent Vitals/Pain Pain Assessment Pain Assessment: Faces Faces Pain Scale: Hurts even more Pain Location: B LE R > L Pain Descriptors / Indicators: Aching, Burning, Constant, Discomfort Pain Intervention(s): Limited activity within patient's tolerance, Monitored during session, Repositioned    Home Living Family/patient expects to be discharged to:: Private residence Living Arrangements: Children Available Help at Discharge: Family Type of Home: House Home Access: Stairs to enter Entrance Stairs-Rails: None Entrance Stairs-Number of Steps: 1   Home Layout: One level Home Equipment: Cane - single point;Rollator (4 wheels);Shower seat      Prior Function Prior Level of Function : Patient poor historian/Family not available;History of Falls (last six months);Independent/Modified Independent (pt states 4 recent falls)             Mobility Comments: pt states IND with all ADLs, self care tasks no AD, no driving and groceries are delivered, daughter assists with household management       Extremity/Trunk Assessment        Lower Extremity Assessment Lower Extremity Assessment: Generalized weakness    Cervical / Trunk Assessment Cervical / Trunk Assessment:  (head forward)  Communication   Communication Communication: Difficulty communicating thoughts/reduced clarity of speech Cueing  Techniques: Verbal cues;Gestural cues;Tactile cues;Visual cues  Cognition Arousal: Alert Behavior During Therapy: Restless, Anxious Overall Cognitive Status: No family/caregiver present to determine baseline cognitive functioning Area of Impairment: Attention, Memory, Following commands, Safety/judgement                     Memory: Decreased short-term memory Following Commands: Follows one step commands consistently Safety/Judgement: Decreased awareness of safety, Decreased awareness of deficits     General Comments: pt reported feeling scared and that she was a little confused and did not know how it happened with pt describing hallucinations in home setting        General Comments      Exercises     Assessment/Plan    PT Assessment Patient needs continued PT services  PT Problem List Decreased strength;Decreased range of motion;Decreased activity tolerance;Decreased balance;Decreased mobility;Decreased coordination;Decreased knowledge of use of DME;Decreased safety awareness;Decreased knowledge of precautions;Pain;Obesity;Decreased skin integrity       PT Treatment Interventions DME instruction;Gait training;Stair training;Functional mobility training;Therapeutic activities;Therapeutic exercise;Balance training;Neuromuscular re-education;Patient/family education    PT Goals (Current goals can be found in  the Care Plan section)  Acute Rehab PT Goals PT Goal Formulation: Patient unable to participate in goal setting    Frequency Min 1X/week     Co-evaluation               AM-PAC PT "6 Clicks" Mobility  Outcome Measure Help needed turning from your back to your side while in a flat bed without using bedrails?: A Lot Help needed moving from lying on your back to sitting on the side of a flat bed without using bedrails?: Total Help needed moving to and from a bed to a chair (including a wheelchair)?: A Lot Help needed standing up from a chair using your arms  (e.g., wheelchair or bedside chair)?: A Lot Help needed to walk in hospital room?: Total Help needed climbing 3-5 steps with a railing? : Total 6 Click Score: 9    End of Session Equipment Utilized During Treatment: Gait belt Activity Tolerance: Patient limited by pain;Patient limited by fatigue Patient left: in chair;with call bell/phone within reach Nurse Communication: Mobility status PT Visit Diagnosis: Unsteadiness on feet (R26.81);Other abnormalities of gait and mobility (R26.89);Muscle weakness (generalized) (M62.81);History of falling (Z91.81);Difficulty in walking, not elsewhere classified (R26.2);Pain Pain - Right/Left:  (B) Pain - part of body: Knee;Leg;Ankle and joints of foot    Time: 4401-0272 PT Time Calculation (min) (ACUTE ONLY): 19 min   Charges:   PT Evaluation $PT Eval Low Complexity: 1 Low   PT General Charges $$ ACUTE PT VISIT: 1 Visit         Johnny Bridge, PT Acute Rehab   Jacqualyn Posey 04/28/2023, 3:59 PM

## 2023-04-28 NOTE — Progress Notes (Signed)
Echocardiogram 2D Echocardiogram has been performed.  Hannah [name] 04/28/2023, 12:15 PM

## 2023-04-28 NOTE — Progress Notes (Signed)
PROGRESS NOTE    Hannah [name]  GNF:[id number] DOB: [birth year] DOA: 04/27/2023 PCP: Darrow Bussing, MD   Brief Narrative: Hannah [name] is a 77 y.o. female with a history of atrial fibrillation, diabetes mellitus type 2, hypertension, diastolic heart failure, morbid obesity.  Patient presented secondary to a fall and leg swelling and was found to have evidence of bilateral leg cellulitis complicated by a chronic leg wound. Empiric Ceftriaxone IV started on admission.   Assessment and Plan:  Delirium Likely related to acute infection. Patient with associated hallucinations. CT head unremarkable for acute process. Symptoms appear to be improved.  Bilateral leg cellulitis Associated right anterior leg wound. CT of both legs confirm cellulitis and are without evidence of underlying abscesses. Patient started empirically on Ceftriaxone and appears to be having a good response thus far. -Continue Ceftriaxone IV  Acute on chronic diastolic heart failure Present on admission. Likely secondary to medication non-adherence. Patient started on Lasix IV. Transthoracic Echocardiogram ordered and is pending. -Continue Lasix -Follow-up Transthoracic Echocardiogram  Persistent atrial fibrillation Patient is on Tikosyn and Eliquis as an outpatient. She has missed several doses of Tikosyn. -Cardiology consult for Tikosyn management -Continue Eliquis  Diabetes mellitus type 2 Well controlled with hemoglobin A1C of 6.2%. Patient is on Jardiance as an outpatient. -SSI  Morbid obesity Estimated body mass index is 49.64 kg/m as calculated from the following:   Height as of this encounter: 5\' 6"  (1.676 m).   Weight as of this encounter: 139.5 kg.   DVT prophylaxis: Eliquis Code Status:   Code Status: Full Code Family Communication: None at bedside Disposition Plan: Discharge pending transition to outpatient antibiotic regimen, heart failure regimen and cardiology management for  atrial fibrillation on Tikosyn   Consultants:  Cardiology  Procedures:  Transthoracic Echocardiogram (pending)  Antimicrobials: Ceftriaxone    Subjective: No issues overnight. Worried about the fact she was having hallucinations prior to admission.  Objective: BP 123/73 (BP Location: Right Arm)   Pulse 89   Temp (!) 97.4 F (36.3 C) (Oral)   Resp 18   Ht 5\' 6"  (1.676 m)   Wt (!) 139.5 kg   SpO2 97%   BMI 49.64 kg/m   Examination:  General exam: Appears calm and comfortable Respiratory system: Clear to auscultation. Respiratory effort normal. Cardiovascular system: S1 & S2 heard, irregular rhythm, normal rate Gastrointestinal system: Abdomen is nondistended, soft and nontender. Normal bowel sounds heard. Central nervous system: Alert and oriented. No focal neurological deficits. Musculoskeletal: No calf tenderness Skin: Mild erythema noted anteriorly on bilateral lower legs that appears improved from prior picture Psychiatry: Judgement and insight appear normal. Mood & affect appropriate.    Data Reviewed: I have personally reviewed following labs and imaging studies  CBC Lab Results  Component Value Date   WBC 10.9 (H) 04/27/2023   RBC 3.57 (L) 04/27/2023   HGB 10.0 (L) 04/27/2023   HCT 34.3 (L) 04/27/2023   MCV 96.1 04/27/2023   MCH 28.0 04/27/2023   PLT 199 04/27/2023   MCHC 29.2 (L) 04/27/2023   RDW 15.9 (H) 04/27/2023   LYMPHSABS 0.3 (L) 04/27/2023   MONOABS 0.4 04/27/2023   EOSABS 0.0 04/27/2023   BASOSABS 0.0 04/27/2023     Last metabolic panel Lab Results  Component Value Date   NA 138 04/28/2023   K 4.4 04/28/2023   CL 112 (H) 04/28/2023   CO2 16 (L) 04/28/2023   BUN 37 (H) 04/28/2023   CREATININE 1.42 (H) 04/28/2023   GLUCOSE 120 (H)  04/28/2023   GFRNONAA 38 (L) 04/28/2023   GFRAA 38 (L) 05/02/2020   CALCIUM 8.6 (L) 04/28/2023   PROT 8.0 04/27/2023   ALBUMIN 3.6 04/27/2023   LABGLOB 3.6 11/01/2019   AGRATIO 1.1 (L) 11/01/2019    BILITOT 1.8 (H) 04/27/2023   ALKPHOS 62 04/27/2023   AST 21 04/27/2023   ALT 20 04/27/2023   ANIONGAP 10 04/28/2023    GFR: Estimated Creatinine Clearance: 48.6 mL/min (A) (by C-G formula based on SCr of 1.42 mg/dL (H)).  Recent Results (from the past 240 hour(s))  Blood culture (routine x 2)     Status: None (Preliminary result)   Collection Time: 04/27/23  4:31 PM   Specimen: BLOOD RIGHT HAND  Result Value Ref Range Status   Specimen Description   Final    BLOOD RIGHT HAND Performed at [facility], [address], [place], [place] [address]    Special Requests   Final    BOTTLES DRAWN AEROBIC AND ANAEROBIC Blood Culture adequate volume Performed at [facility], [address], [place], [place] [address]    Culture   Final    NO GROWTH < 12 HOURS Performed at [facility], [address], [place], [place] [address]    Report Status PENDING  Incomplete  Blood culture (routine x 2)     Status: None (Preliminary result)   Collection Time: 04/27/23  4:42 PM   Specimen: BLOOD  Result Value Ref Range Status   Specimen Description   Final    BLOOD RIGHT ANTECUBITAL Performed at [facility], [address], [place], [place] [address]    Special Requests   Final    BOTTLES DRAWN AEROBIC AND ANAEROBIC Blood Culture adequate volume Performed at [facility], [address], [place], [place] [address]    Culture   Final    NO GROWTH < 12 HOURS Performed at [facility], [address], [place], [place] [address]    Report Status PENDING  Incomplete      Radiology Studies: CT TIBIA FIBULA RIGHT WO CONTRAST  Result Date: 04/28/2023 CLINICAL DATA:  Weakening lower legs, concern for infection EXAM: CT OF THE LOWER RIGHT EXTREMITY WITHOUT CONTRAST TECHNIQUE: Multidetector CT imaging of the right lower extremity was performed according to the standard protocol. RADIATION DOSE REDUCTION: This exam was  performed according to the departmental dose-optimization program which includes automated exposure control, adjustment of the mA and/or kV according to patient size and/or use of iterative reconstruction technique. COMPARISON:  None Available. FINDINGS: Bones/Joint/Cartilage No acute bony abnormality. Specifically, no fracture, subluxation, or dislocation. No bone destruction to suggest osteomyelitis. Tricompartment degenerative changes in the right knee. Small joint effusion. Ligaments Suboptimally assessed by CT. Muscles and Tendons Unremarkable. Soft tissues Diffuse marked edema throughout the subcutaneous soft tissues. No focal drainable fluid collection. IMPRESSION: No acute bony abnormality.  No evidence of osteomyelitis. Diffuse severe edema throughout the subcutaneous soft tissues. Electronically Signed   By: Charlett Nose M.D.   On: 04/28/2023 00:57   CT TIBIA FIBULA LEFT WO CONTRAST  Result Date: 04/28/2023 CLINICAL DATA:  Soft tissue infection suspected. EXAM: CT OF THE LOWER LEFT EXTREMITY WITHOUT CONTRAST TECHNIQUE: Multidetector CT imaging of the lower left extremity was performed according to the standard protocol. RADIATION DOSE REDUCTION: This exam was performed according to the departmental dose-optimization program which includes automated exposure control, adjustment of the mA and/or kV according to patient size and/or use of  iterative reconstruction technique. COMPARISON:  None Available. FINDINGS: Bones/Joint/Cartilage There is no acute fracture or dislocation. No cortical erosions are identified. There is a small knee joint effusion. There is moderate medial compartment joint space narrowing of the knee. There is tricompartmental osteophyte formation of the knee compatible with degenerative change. There is also cystic degenerative change of the midfoot. Ligaments Suboptimally assessed by CT. Muscles and Tendons Deep fascial planes are well preserved. No intramuscular fluid collection or  gas. Vascular calcifications are extensive. Soft tissues There is marked diffuse soft tissue swelling and edema throughout the entire lower extremity most significant more inferiorly. There some skin thickening of the lower extremity diffusely. No foreign body, soft tissue gas or definitive drainable fluid collection identified. Peripheral vascular calcifications are present. IMPRESSION: 1. Marked diffuse soft tissue swelling and edema throughout the entire lower extremity most significant more inferiorly. Findings may be related to cellulitis. No soft tissue gas, foreign body or drainable fluid collection identified. 2. No acute osseous abnormality. 3. Small knee joint effusion. 4. Degenerative changes of the knee and midfoot. Electronically Signed   By: Darliss Cheney M.D.   On: 04/28/2023 00:19   CT Head Wo Contrast  Result Date: 04/27/2023 CLINICAL DATA:  Mental status change EXAM: CT HEAD WITHOUT CONTRAST TECHNIQUE: Contiguous axial images were obtained from the base of the skull through the vertex without intravenous contrast. RADIATION DOSE REDUCTION: This exam was performed according to the departmental dose-optimization program which includes automated exposure control, adjustment of the mA and/or kV according to patient size and/or use of iterative reconstruction technique. COMPARISON:  None Available. FINDINGS: Brain: No evidence of acute infarction, hemorrhage, hydrocephalus, extra-axial collection or mass lesion/mass effect. Vascular: Atherosclerotic calcifications are present within the cavernous internal carotid arteries. Skull: Normal. Negative for fracture or focal lesion. Sinuses/Orbits: No acute finding. Other: None. IMPRESSION: No acute intracranial abnormality. Electronically Signed   By: Darliss Cheney M.D.   On: 04/27/2023 18:37   DG Chest Portable 1 View  Result Date: 04/27/2023 CLINICAL DATA:  Bilateral lower extremity edema. EXAM: PORTABLE CHEST 1 VIEW COMPARISON:  April 20, 2021.  FINDINGS: Stable cardiomegaly. Both lungs are clear. The visualized skeletal structures are unremarkable. IMPRESSION: No active disease. Electronically Signed   By: Lupita Raider M.D.   On: 04/27/2023 18:25      LOS: 0 days    Jacquelin Hawking, MD [facility] 04/28/2023, 11:07 AM   If 7PM-7AM, please contact night-coverage [email or web address]

## 2023-04-28 NOTE — Evaluation (Signed)
Occupational Therapy Evaluation Patient Details Name: Hannah [name] MRN: [id number] DOB: 1945/10/18 Today's Date: 04/28/2023   History of Present Illness 77 yr old female presented to ED on 04/27/2023 due to AMS with hallucinations. Pt was found to have B LE cellulitis and metabolic encephalopathy. Pt UA ahd head CT negative for acute findings, EKG reveals A-fib and incomplete R BBB. Pt PMH includes but is not limited to: A-fib, DM II, glaucoma, HTN, thyroid disease   Clinical Impression   The pt is currently limited by the below listed deficits, which compromise her ADL performance and overall functional independence (see OT problem list). At current, she requires increased assist for tasks, including lower body dressing, toileting, and functional transfers. She reported significant BLE pain, which is worse in her RLE, and is also worse in standing. As such, she reported an inability to attempt standing today, due to pain she described to be worse than child birth. Without further OT services, she is at risk for restricted ADL participation and progressive weakness. Patient will benefit from continued inpatient follow up therapy, <3 hours/day.         If plan is discharge home, recommend the following: A lot of help with walking and/or transfers;A lot of help with bathing/dressing/bathroom;Assist for transportation;Help with stairs or ramp for entrance;Assistance with cooking/housework;Direct supervision/assist for medications management    Functional Status Assessment  Patient has had a recent decline in their functional status and demonstrates the ability to make significant improvements in function in a reasonable and predictable amount of time.  Equipment Recommendations  Other (comment) (defer to next level of care)    Recommendations for Other Services       Precautions / Restrictions Precautions Precautions: Fall Restrictions Weight Bearing Restrictions: No       Mobility Bed Mobility      General bed mobility comments: pt was received seated in the bedside chair    Transfers        General transfer comment: The pt gently deferred, reporting increased RLE pain and feeling as though she would not be able to tolerate standing      Balance     Sitting balance-Leahy Scale:  (Fair+)       Standing balance-Leahy Scale:  (unable to assess)              ADL either performed or assessed with clinical judgement   ADL Overall ADL's : Needs assistance/impaired Eating/Feeding: Independent;Sitting   Grooming: Set up;Sitting   Upper Body Bathing: Set up;Sitting Upper Body Bathing Details (indicate cue type and reason): simulated Lower Body Bathing: Maximal assistance Lower Body Bathing Details (indicate cue type and reason): based on clinical judgement (at chair level) Upper Body Dressing : Set up;Sitting   Lower Body Dressing: Maximal assistance;Sitting/lateral leans       Toileting- Clothing Manipulation and Hygiene: Maximal assistance;Total assistance Toileting - Clothing Manipulation Details (indicate cue type and reason): based on clinical judgement             Vision Baseline Vision/History: 1 Wears glasses Additional Comments: She correctly read the time depicted on the wall clock     Perception Perception: Impaired           Pertinent Vitals/Pain Pain Assessment Pain Assessment: Faces Pain Score: 6  Pain Location: B LE R > L Pain Intervention(s): Limited activity within patient's tolerance, Monitored during session, Repositioned     Extremity/Trunk Assessment     Lower Extremity Assessment Lower Extremity Assessment: Generalized weakness  Communication Communication Communication: Difficulty communicating thoughts/reduced clarity of speech Cueing Techniques: Verbal cues;Gestural cues;Tactile cues;Visual cues   Cognition Arousal: Alert Behavior During Therapy: Anxious Overall Cognitive Status:  No family/caregiver present to determine baseline cognitive functioning Area of Impairment: Attention                       Following Commands: Follows one step commands consistently       General Comments: required intermittent redirection to tasks, due to being tangential; oriented to person, place, month, and year.                 Home Living Family/patient expects to be discharged to:: Private residence Living Arrangements: Children/daughter Available Help at Discharge: Family Type of Home: House Home Access: Stairs to enter [employer] of Steps: 1 Entrance Stairs-Rails: None Home Layout: Two level;Able to live on main level with bedroom/bathroom; her bedroom is on the main level     Bathroom Shower/Tub: Walk-in shower;Tub/shower unit   Bathroom Toilet: Standard     Home Equipment: Rollator (4 wheels);Shower seat          Prior Functioning/Environment Prior Level of Function : Patient poor historian/Family not available;History of Falls (last six months);Independent/Modified Independent             Mobility Comments: Pt states independent with all ADLs typically, though having occasional difficulty with tasks over the past few weeks. She does not drive.  She used a rollator for household ambulation as needed.         OT Problem List: Decreased strength;Decreased activity tolerance;Impaired balance (sitting and/or standing);Decreased knowledge of use of DME or AE;Pain      OT Treatment/Interventions: Self-care/ADL training;Therapeutic exercise;Energy conservation;DME and/or AE instruction;Balance training;Therapeutic activities;Patient/family education    OT Goals(Current goals can be found in the care plan section) Acute Rehab OT Goals Patient Stated Goal: decreased pain and to get better OT Goal Formulation: With patient Time For Goal Achievement: 05/12/23 Potential to Achieve Goals: Good ADL Goals Pt Will Perform Lower Body  Dressing: with contact guard assist;with adaptive equipment;sitting/lateral leans;sit to/from stand Pt Will Transfer to Toilet: with contact guard assist;ambulating;bedside commode Pt Will Perform Toileting - Clothing Manipulation and hygiene: with contact guard assist;sit to/from stand  OT Frequency: Min 1X/week    Co-evaluation              AM-PAC OT "6 Clicks" Daily Activity     Outcome Measure Help from another person eating meals?: None Help from another person taking care of personal grooming?: A Little Help from another person toileting, which includes using toliet, bedpan, or urinal?: A Lot Help from another person bathing (including washing, rinsing, drying)?: A Lot Help from another person to put on and taking off regular upper body clothing?: A Little Help from another person to put on and taking off regular lower body clothing?: A Lot 6 Click Score: 16   End of Session Equipment Utilized During Treatment: Other (comment) (N/A) Nurse Communication: Mobility status  Activity Tolerance: Patient limited by pain Patient left: in chair;with call bell/phone within reach  OT Visit Diagnosis: History of falling (Z91.81);Pain;Unsteadiness on feet (R26.81) Pain - Right/Left: Right Pain - part of body: Leg                Time: 4098-1191 OT Time Calculation (min): 29 min Charges:  OT General Charges $OT Visit: 1 Visit OT Evaluation $OT Eval Moderate Complexity: 1 Mod   Lorimer Tiberio L Lyrah Bradt, OTR/L 04/28/2023,  5:25 PM

## 2023-04-28 NOTE — Consult Note (Signed)
WOC Nurse Consult Note: Patient brought in by EMS patient is hallucinating and has been having falls. Per EMS patient was found sitting in her recliner for the past 3 days, covered in urine.   Reason for Consult:LE wound Wound type: trauma; RLE Hemosiderin staining LLE, bilateral LE edema  Pressure Injury POA: NA Measurement: see nursing flow sheet Wound OZH:YQMV purple area on the RLE pretibial; linear skin disruption,  Drainage (amount, consistency, odor) non documented  Periwound:edema  Dressing procedure/placement/frequency: Cover affected area with xeroform and top with foam dressing. Change every other day.   Discussed POC with patient and bedside nurse.  Re consult if needed, will not follow at this time. Thanks  Leilene Diprima [employer], RN,CWOCN, CNS, CWON-AP [phone])

## 2023-04-29 DIAGNOSIS — I10 Essential (primary) hypertension: Secondary | ICD-10-CM | POA: Diagnosis not present

## 2023-04-29 DIAGNOSIS — I5033 Acute on chronic diastolic (congestive) heart failure: Secondary | ICD-10-CM | POA: Diagnosis not present

## 2023-04-29 DIAGNOSIS — I4891 Unspecified atrial fibrillation: Secondary | ICD-10-CM | POA: Diagnosis not present

## 2023-04-29 DIAGNOSIS — E7800 Pure hypercholesterolemia, unspecified: Secondary | ICD-10-CM | POA: Diagnosis not present

## 2023-04-29 DIAGNOSIS — Z91A98 Caregiver's noncompliance with patient's other medical treatment and regimen for other reason: Secondary | ICD-10-CM

## 2023-04-29 DIAGNOSIS — I5023 Acute on chronic systolic (congestive) heart failure: Secondary | ICD-10-CM | POA: Diagnosis not present

## 2023-04-29 DIAGNOSIS — G9341 Metabolic encephalopathy: Secondary | ICD-10-CM | POA: Diagnosis not present

## 2023-04-29 LAB — BASIC METABOLIC PANEL
Anion gap: 10 (ref 5–15)
BUN: 43 mg/dL — ABNORMAL HIGH (ref 8–23)
CO2: 21 mmol/L — ABNORMAL LOW (ref 22–32)
Calcium: 8.6 mg/dL — ABNORMAL LOW (ref 8.9–10.3)
Chloride: 110 mmol/L (ref 98–111)
Creatinine, Ser: 1.39 mg/dL — ABNORMAL HIGH (ref 0.44–1.00)
GFR, Estimated: 39 mL/min — ABNORMAL LOW (ref >60)
Glucose, Bld: 128 mg/dL — ABNORMAL HIGH (ref 70–99)
Potassium: 4.1 mmol/L (ref 3.5–5.1)
Sodium: 141 mmol/L (ref 135–145)

## 2023-04-29 LAB — GLUCOSE, CAPILLARY
Glucose-Capillary: 100 mg/dL — ABNORMAL HIGH (ref 70–99)
Glucose-Capillary: 124 mg/dL — ABNORMAL HIGH (ref 70–99)
Glucose-Capillary: 126 mg/dL — ABNORMAL HIGH (ref 70–99)
Glucose-Capillary: 133 mg/dL — ABNORMAL HIGH (ref 70–99)
Glucose-Capillary: 164 mg/dL — ABNORMAL HIGH (ref 70–99)
Glucose-Capillary: 97 mg/dL (ref 70–99)

## 2023-04-29 LAB — CBC
HCT: 33.2 % — ABNORMAL LOW (ref 36.0–46.0)
Hemoglobin: 9.5 g/dL — ABNORMAL LOW (ref 12.0–15.0)
MCH: 27.6 pg (ref 26.0–34.0)
MCHC: 28.6 g/dL — ABNORMAL LOW (ref 30.0–36.0)
MCV: 96.5 fL (ref 80.0–100.0)
Platelets: 194 10*3/uL (ref 150–400)
RBC: 3.44 MIL/uL — ABNORMAL LOW (ref 3.87–5.11)
RDW: 16.1 % — ABNORMAL HIGH (ref 11.5–15.5)
WBC: 7.3 10*3/uL (ref 4.0–10.5)
nRBC: 0 % (ref 0.0–0.2)

## 2023-04-29 LAB — MAGNESIUM: Magnesium: 2.4 mg/dL (ref 1.7–2.4)

## 2023-04-29 MED ORDER — FUROSEMIDE 10 MG/ML IJ SOLN
40.0000 mg | Freq: Once | INTRAMUSCULAR | Status: AC
  Administered 2023-04-29: 40 mg via INTRAVENOUS

## 2023-04-29 MED ORDER — CHLORHEXIDINE GLUCONATE CLOTH 2 % EX PADS
6.0000 | MEDICATED_PAD | Freq: Every day | CUTANEOUS | Status: AC
  Administered 2023-04-29 – 2023-05-03 (×4): 6 via TOPICAL

## 2023-04-29 MED ORDER — FUROSEMIDE 10 MG/ML IJ SOLN
80.0000 mg | Freq: Two times a day (BID) | INTRAMUSCULAR | Status: DC
  Administered 2023-04-29 – 2023-05-06 (×14): 80 mg via INTRAVENOUS
  Filled 2023-04-29 (×14): qty 8

## 2023-04-29 MED ORDER — MUPIROCIN 2 % EX OINT
1.0000 | TOPICAL_OINTMENT | Freq: Two times a day (BID) | CUTANEOUS | Status: AC
  Administered 2023-04-29 – 2023-05-03 (×10): 1 via NASAL
  Filled 2023-04-29 (×3): qty 22

## 2023-04-29 NOTE — Plan of Care (Signed)

## 2023-04-29 NOTE — [place] FL2 (Signed)
[place] MEDICAID FL2 LEVEL OF CARE FORM     IDENTIFICATION  Patient Name: Hannah [name] Birthdate: [birth year] Sex: female Admission Date (Current Location): 04/27/2023  [facility] and [place] Number:  Producer, television/film/video and Address:  [facility],  [address] [place] [address]      Provider Number: [id number]  Attending Physician Name and Address:  Glade Lloyd, MD  Relative Name and Phone Number:  [name],Deanna (Daughter)  [phone] (Mobile)    Current Level of Care: Hospital Recommended Level of Care: Skilled Nursing Facility Prior Approval Number:    Date Approved/Denied:   PASRR Number: pending  Discharge Plan: SNF    Current Diagnoses: Patient Active Problem List   Diagnosis Date Noted   Acute on chronic diastolic CHF (congestive heart failure) (HCC) 04/28/2023   Acute metabolic encephalopathy 04/27/2023   Pain due to onychomycosis of toenails of both feet 06/10/2021   Pressure injury of skin 04/22/2021   Septic arthritis (HCC) 04/21/2021   Symptomatic anemia 04/20/2021   Cellulitis 04/20/2021   Secondary hypercoagulable state (HCC) 01/28/2021   Persistent atrial fibrillation (HCC) 01/28/2021   Acute on chronic diastolic heart failure (HCC) 11/24/2019   PAF (paroxysmal atrial fibrillation) (HCC) 11/24/2019   Hypothyroidism 11/24/2019   DMII (diabetes mellitus, type 2) (HCC) 11/24/2019   Acute respiratory failure (HCC) 12/07/2018   PTSD (post-traumatic stress disorder) 08/29/2017   Atrial fibrillation (HCC) [I48.91] 06/16/2017   Long term (current) use of anticoagulants [Z79.01] 06/16/2017   Chronic venous insufficiency 09/18/2016   CHF (congestive heart failure) (HCC) 09/13/2016   Community acquired pneumonia 09/13/2016   Cellulitis of right lower extremity 09/13/2016    Orientation RESPIRATION BLADDER Height & Weight     Self, Time, Situation, Place  Normal Incontinent Weight: (!) 307 lb 8.7 oz (139.5 kg) Height:  5'  6" (167.6 cm)  BEHAVIORAL SYMPTOMS/MOOD NEUROLOGICAL BOWEL NUTRITION STATUS      Continent Diet (heart healthy)  AMBULATORY STATUS COMMUNICATION OF NEEDS Skin   Limited Assist Verbally Normal                       Personal Care Assistance Level of Assistance  Bathing, Feeding, Dressing Bathing Assistance: Limited assistance Feeding assistance: Independent Dressing Assistance: Limited assistance     Functional Limitations Info  Sight, Hearing, Speech Sight Info: Adequate Hearing Info: Adequate Speech Info: Adequate    SPECIAL CARE FACTORS FREQUENCY  OT (By licensed OT), PT (By licensed PT)     PT Frequency: 5 x a week OT Frequency: 5 x a week            Contractures Contractures Info: Not present    Additional Factors Info  Code Status, Allergies Code Status Info: full Allergies Info: Penicillins  Azithromycin           Current Medications (04/29/2023):  This is the current hospital active medication list Current Facility-Administered Medications  Medication Dose Route Frequency Provider Last Rate Last Admin   acetaminophen (TYLENOL) tablet 650 mg  650 mg Oral Q4H PRN Hillary Bow, DO   650 mg at 04/29/23 2952   apixaban (ELIQUIS) tablet 5 mg  5 mg Oral BID Narda Bonds, MD   5 mg at 04/29/23 0851   busPIRone (BUSPAR) tablet 15 mg  15 mg Oral TID Narda Bonds, MD   15 mg at 04/29/23 0851   cefTRIAXone (ROCEPHIN) 2 g in sodium chloride 0.9 % 100 mL IVPB  2 g Intravenous Q24H  Hillary Bow, DO 200 mL/hr at 04/29/23 0909 2 g at 04/29/23 3762   Chlorhexidine Gluconate Cloth 2 % PADS 6 each  6 each Topical Q0600 Glade Lloyd, MD   6 each at 04/29/23 1233   DULoxetine (CYMBALTA) DR capsule 90 mg  90 mg Oral Daily Narda Bonds, MD   90 mg at 04/29/23 0850   ferrous sulfate tablet 325 mg  325 mg Oral BID WC Narda Bonds, MD   325 mg at 04/29/23 0851   furosemide (LASIX) injection 80 mg  80 mg Intravenous BID Glade Lloyd, MD       insulin aspart  (novoLOG) injection 0-9 Units  0-9 Units Subcutaneous Q4H Hillary Bow, DO   1 Units at 04/29/23 8315   levothyroxine (SYNTHROID) tablet 125 mcg  125 mcg Oral Q0600 Narda Bonds, MD   125 mcg at 04/29/23 0504   lidocaine (LIDODERM) 5 % 1 patch  1 patch Transdermal Q24H Luiz Iron, NP   1 patch at 04/29/23 0429   montelukast (SINGULAIR) tablet 10 mg  10 mg Oral QHS Narda Bonds, MD   10 mg at 04/28/23 2120   mupirocin ointment (BACTROBAN) 2 % 1 Application  1 Application Nasal BID Glade Lloyd, MD       ondansetron (ZOFRAN) injection 4 mg  4 mg Intravenous Q6H PRN Hillary Bow, DO       rosuvastatin (CRESTOR) tablet 5 mg  5 mg Oral Daily Narda Bonds, MD   5 mg at 04/29/23 0850   sodium chloride flush (NS) 0.9 % injection 3 mL  3 mL Intravenous Q12H Lyda Perone M, DO   3 mL at 04/29/23 1761   sodium chloride flush (NS) 0.9 % injection 3 mL  3 mL Intravenous PRN Hillary Bow, DO         Discharge Medications: Please see discharge summary for a list of discharge medications.  Relevant Imaging Results:  Relevant Lab Results:   Additional Information SSN :[id number]  Valentina Shaggy Jayden Rudge, LCSW

## 2023-04-29 NOTE — Progress Notes (Signed)
[place], [place] [address]    Report Status PENDING  Incomplete  MRSA Next Gen by PCR, Nasal     Status: Abnormal   Collection Time: 04/28/23  9:06 AM   Specimen: Nasal Mucosa; Nasal Swab  Result Value Ref Range Status   MRSA by PCR Next Gen DETECTED (A) NOT DETECTED Final    Comment: RESULT CALLED TO, READ BACK BY AND VERIFIED WITH: FOUNDER, W.  RN AT 1202 ON 1203 ON 04/28/2023 BY MECIAL J. (NOTE) The GeneXpert MRSA Assay (FDA approved for NASAL specimens only), is one component of a comprehensive MRSA colonization surveillance program. It is not intended to diagnose MRSA infection nor to guide or monitor treatment for MRSA infections. Test performance is not FDA approved in patients less than 33 years old. Performed at [facility], [address], [place], [place] [address]          Radiology Studies: ECHOCARDIOGRAM COMPLETE  Result Date: 04/28/2023    ECHOCARDIOGRAM REPORT   Patient Name:   Hannah [name] Date of Exam: 04/28/2023 Medical Rec #:  [id number]           Height:       66.0 in Accession #:    [id number]          Weight:       306.0 lb Date of Birth:  October 16, 1945           BSA:          2.396 m Patient Age:    77 years            BP:           145/86 mmHg Patient Gender: F                   HR:           82 bpm. Exam Location:  Inpatient Procedure: 2D Echo, Cardiac Doppler, Color Doppler and Intracardiac            Opacification Agent Indications:    CHF-Acute Diastolic I50.31  History:        Patient has prior history of Echocardiogram examinations, most                 recent 01/18/2018. CHF, Arrythmias:Atrial Fibrillation; Risk                 Factors:Diabetes.  Sonographer:    Lucendia Herrlich RCS Referring Phys: [phone] JARED M GARDNER IMPRESSIONS  1. Left ventricular ejection fraction, by estimation, is 55 to 60%. The left ventricle has normal function. Left ventricular endocardial border not optimally defined to evaluate regional wall motion. There is mild concentric left ventricular hypertrophy. Left ventricular diastolic parameters are indeterminate. There was not sufficient IV access for Definity contrast imaging.  2. Right ventricular systolic function is normal. The right ventricular size is not well visualized. There is moderately elevated pulmonary artery systolic pressure. The estimated right ventricular systolic pressure is 54.9 mmHg.  3. Left atrial size was moderately dilated.  4. The mitral valve is grossly normal. Moderate mitral valve regurgitation. No evidence of mitral stenosis.  5. Tricuspid valve regurgitation is moderate to severe.  6. The aortic valve is tricuspid. Aortic valve regurgitation is not visualized. Aortic valve sclerosis is present, with no evidence of  aortic valve stenosis.  7. The inferior vena cava is dilated in size with <50% respiratory variability, suggesting right atrial pressure of 15 mmHg. Comparison(s): Prior images reviewed side by side.  PROGRESS NOTE    Hannah [name]  ZOX:[id number] DOB: 1945-10-06 DOA: 04/27/2023 PCP: Darrow Bussing, MD   Brief Narrative:  77 y.o. female with a history of atrial fibrillation, diabetes mellitus type 2, hypertension, diastolic heart failure, morbid obesity presented with a fall and increased leg swelling and was found to have evidence of bilateral leg cellulitis complicated by chronic leg wound.  Started on IV Rocephin.  Assessment & Plan:   Bilateral lower extremity cellulitis -With associated right anterior leg wound.  Wound care as per wound care RN recommendations -CT of both legs confirm cellulitis without evidence of underlying abscesses.  Continue Rocephin: Cellulitis improving but still significant.  Acute on chronic diastolic heart failure -Likely secondary to medication nonadherence.  Strict input and output.  Daily weights.  Fluid restriction. -Still looks volume overloaded.  Increase Lasix to 80 mg IV every 12 hours.  Echo showed EF of 55 to 60% with moderate mitral valve regurgitation and moderate to severe tricuspid valve regurgitation -Cardiology has been consulted.  Follow recommendations  Acute metabolic encephalopathy/delirium -Likely related to above.  CT head unremarkable for acute process.  Symptoms appear to have resolved.  Persistent A-fib -Apparently patient missed several doses of Tikosyn as an outpatient.  Cardiology has been consulted for Tikosyn management and heart failure.  Continue Eliquis.  Currently rate controlled  CKD stage IIIb -Creatinine currently stable.  Monitor  Acute metabolic acidosis -Bicarbonate improving.  Monitor.  Anemia of chronic disease -From renal failure.  Hemoglobin currently stable.  Monitor.  Diabetes mellitus type 2 -A1c 6.2.  She is on Jardiance as an outpatient.  Carb modified diet.  Hypothyroidism -Continue levothyroxine  Hyperlipidemia -Continue statin  Morbid obesity -Outpatient  follow-up  Anxiety/depression -Continue buspirone and duloxetine  Physical deconditioning -PT eval   DVT prophylaxis: Eliquis Code Status: Full Family Communication: None at bedside Disposition Plan: Status is: Inpatient Remains inpatient appropriate because: Of severity of illness    Consultants: Cardiology  Procedures: 2D echo  Antimicrobials: Rocephin from 04/27/2023 onwards   Subjective: Patient seen and examined at bedside.  Pain slightly better but still feels swollen in her legs.  No fever, vomiting, chest pain reported.  Does not feel ready to go home yet.  Objective: Vitals:   04/28/23 0836 04/28/23 1246 04/28/23 1952 04/29/23 0357  BP: 123/73 110/68 (!) 144/80 (!) 141/86  Pulse: 89 90 88 (!) 50  Resp: 18  19 19   Temp: (!) 97.4 F (36.3 C) 98.2 F (36.8 C) 97.7 F (36.5 C) 97.8 F (36.6 C)  TempSrc: Oral Oral Oral Oral  SpO2: 97% 97% 98% 100%  Weight:      Height:        Intake/Output Summary (Last 24 hours) at 04/29/2023 0925 Last data filed at 04/29/2023 0020 Gross per 24 hour  Intake 700 ml  Output 1400 ml  Net -700 ml   Filed Weights   04/27/23 1447 04/27/23 2313 04/28/23 0733  Weight: (!) 158.8 kg (!) 138.8 kg (!) 139.5 kg    Examination:  General exam: Appears calm and comfortable.  On room air.  Looks chronically ill and deconditioned. Respiratory system: Bilateral decreased breath sounds at bases with scattered crackles Cardiovascular system: S1 & S2 heard, mild intermittent bradycardia present gastrointestinal system: Abdomen is morbidly obese, nondistended, soft and nontender. Normal bowel sounds heard. Extremities: No cyanosis, clubbing; bilateral lower extremity edema present Central nervous system: Alert and oriented. No focal neurological deficits. Moving extremities Skin: Bilateral lower extremity erythema, tenderness present with dressing  [place], [place] [address]    Report Status PENDING  Incomplete  MRSA Next Gen by PCR, Nasal     Status: Abnormal   Collection Time: 04/28/23  9:06 AM   Specimen: Nasal Mucosa; Nasal Swab  Result Value Ref Range Status   MRSA by PCR Next Gen DETECTED (A) NOT DETECTED Final    Comment: RESULT CALLED TO, READ BACK BY AND VERIFIED WITH: FOUNDER, W.  RN AT 1202 ON 1203 ON 04/28/2023 BY MECIAL J. (NOTE) The GeneXpert MRSA Assay (FDA approved for NASAL specimens only), is one component of a comprehensive MRSA colonization surveillance program. It is not intended to diagnose MRSA infection nor to guide or monitor treatment for MRSA infections. Test performance is not FDA approved in patients less than 33 years old. Performed at [facility], [address], [place], [place] [address]          Radiology Studies: ECHOCARDIOGRAM COMPLETE  Result Date: 04/28/2023    ECHOCARDIOGRAM REPORT   Patient Name:   Hannah [name] Date of Exam: 04/28/2023 Medical Rec #:  [id number]           Height:       66.0 in Accession #:    [id number]          Weight:       306.0 lb Date of Birth:  October 16, 1945           BSA:          2.396 m Patient Age:    77 years            BP:           145/86 mmHg Patient Gender: F                   HR:           82 bpm. Exam Location:  Inpatient Procedure: 2D Echo, Cardiac Doppler, Color Doppler and Intracardiac            Opacification Agent Indications:    CHF-Acute Diastolic I50.31  History:        Patient has prior history of Echocardiogram examinations, most                 recent 01/18/2018. CHF, Arrythmias:Atrial Fibrillation; Risk                 Factors:Diabetes.  Sonographer:    Lucendia Herrlich RCS Referring Phys: [phone] JARED M GARDNER IMPRESSIONS  1. Left ventricular ejection fraction, by estimation, is 55 to 60%. The left ventricle has normal function. Left ventricular endocardial border not optimally defined to evaluate regional wall motion. There is mild concentric left ventricular hypertrophy. Left ventricular diastolic parameters are indeterminate. There was not sufficient IV access for Definity contrast imaging.  2. Right ventricular systolic function is normal. The right ventricular size is not well visualized. There is moderately elevated pulmonary artery systolic pressure. The estimated right ventricular systolic pressure is 54.9 mmHg.  3. Left atrial size was moderately dilated.  4. The mitral valve is grossly normal. Moderate mitral valve regurgitation. No evidence of mitral stenosis.  5. Tricuspid valve regurgitation is moderate to severe.  6. The aortic valve is tricuspid. Aortic valve regurgitation is not visualized. Aortic valve sclerosis is present, with no evidence of  aortic valve stenosis.  7. The inferior vena cava is dilated in size with <50% respiratory variability, suggesting right atrial pressure of 15 mmHg. Comparison(s): Prior images reviewed side by side.  PROGRESS NOTE    Hannah [name]  ZOX:[id number] DOB: 1945-10-06 DOA: 04/27/2023 PCP: Darrow Bussing, MD   Brief Narrative:  77 y.o. female with a history of atrial fibrillation, diabetes mellitus type 2, hypertension, diastolic heart failure, morbid obesity presented with a fall and increased leg swelling and was found to have evidence of bilateral leg cellulitis complicated by chronic leg wound.  Started on IV Rocephin.  Assessment & Plan:   Bilateral lower extremity cellulitis -With associated right anterior leg wound.  Wound care as per wound care RN recommendations -CT of both legs confirm cellulitis without evidence of underlying abscesses.  Continue Rocephin: Cellulitis improving but still significant.  Acute on chronic diastolic heart failure -Likely secondary to medication nonadherence.  Strict input and output.  Daily weights.  Fluid restriction. -Still looks volume overloaded.  Increase Lasix to 80 mg IV every 12 hours.  Echo showed EF of 55 to 60% with moderate mitral valve regurgitation and moderate to severe tricuspid valve regurgitation -Cardiology has been consulted.  Follow recommendations  Acute metabolic encephalopathy/delirium -Likely related to above.  CT head unremarkable for acute process.  Symptoms appear to have resolved.  Persistent A-fib -Apparently patient missed several doses of Tikosyn as an outpatient.  Cardiology has been consulted for Tikosyn management and heart failure.  Continue Eliquis.  Currently rate controlled  CKD stage IIIb -Creatinine currently stable.  Monitor  Acute metabolic acidosis -Bicarbonate improving.  Monitor.  Anemia of chronic disease -From renal failure.  Hemoglobin currently stable.  Monitor.  Diabetes mellitus type 2 -A1c 6.2.  She is on Jardiance as an outpatient.  Carb modified diet.  Hypothyroidism -Continue levothyroxine  Hyperlipidemia -Continue statin  Morbid obesity -Outpatient  follow-up  Anxiety/depression -Continue buspirone and duloxetine  Physical deconditioning -PT eval   DVT prophylaxis: Eliquis Code Status: Full Family Communication: None at bedside Disposition Plan: Status is: Inpatient Remains inpatient appropriate because: Of severity of illness    Consultants: Cardiology  Procedures: 2D echo  Antimicrobials: Rocephin from 04/27/2023 onwards   Subjective: Patient seen and examined at bedside.  Pain slightly better but still feels swollen in her legs.  No fever, vomiting, chest pain reported.  Does not feel ready to go home yet.  Objective: Vitals:   04/28/23 0836 04/28/23 1246 04/28/23 1952 04/29/23 0357  BP: 123/73 110/68 (!) 144/80 (!) 141/86  Pulse: 89 90 88 (!) 50  Resp: 18  19 19   Temp: (!) 97.4 F (36.3 C) 98.2 F (36.8 C) 97.7 F (36.5 C) 97.8 F (36.6 C)  TempSrc: Oral Oral Oral Oral  SpO2: 97% 97% 98% 100%  Weight:      Height:        Intake/Output Summary (Last 24 hours) at 04/29/2023 0925 Last data filed at 04/29/2023 0020 Gross per 24 hour  Intake 700 ml  Output 1400 ml  Net -700 ml   Filed Weights   04/27/23 1447 04/27/23 2313 04/28/23 0733  Weight: (!) 158.8 kg (!) 138.8 kg (!) 139.5 kg    Examination:  General exam: Appears calm and comfortable.  On room air.  Looks chronically ill and deconditioned. Respiratory system: Bilateral decreased breath sounds at bases with scattered crackles Cardiovascular system: S1 & S2 heard, mild intermittent bradycardia present gastrointestinal system: Abdomen is morbidly obese, nondistended, soft and nontender. Normal bowel sounds heard. Extremities: No cyanosis, clubbing; bilateral lower extremity edema present Central nervous system: Alert and oriented. No focal neurological deficits. Moving extremities Skin: Bilateral lower extremity erythema, tenderness present with dressing  PROGRESS NOTE    Hannah [name]  ZOX:[id number] DOB: 1945-10-06 DOA: 04/27/2023 PCP: Darrow Bussing, MD   Brief Narrative:  77 y.o. female with a history of atrial fibrillation, diabetes mellitus type 2, hypertension, diastolic heart failure, morbid obesity presented with a fall and increased leg swelling and was found to have evidence of bilateral leg cellulitis complicated by chronic leg wound.  Started on IV Rocephin.  Assessment & Plan:   Bilateral lower extremity cellulitis -With associated right anterior leg wound.  Wound care as per wound care RN recommendations -CT of both legs confirm cellulitis without evidence of underlying abscesses.  Continue Rocephin: Cellulitis improving but still significant.  Acute on chronic diastolic heart failure -Likely secondary to medication nonadherence.  Strict input and output.  Daily weights.  Fluid restriction. -Still looks volume overloaded.  Increase Lasix to 80 mg IV every 12 hours.  Echo showed EF of 55 to 60% with moderate mitral valve regurgitation and moderate to severe tricuspid valve regurgitation -Cardiology has been consulted.  Follow recommendations  Acute metabolic encephalopathy/delirium -Likely related to above.  CT head unremarkable for acute process.  Symptoms appear to have resolved.  Persistent A-fib -Apparently patient missed several doses of Tikosyn as an outpatient.  Cardiology has been consulted for Tikosyn management and heart failure.  Continue Eliquis.  Currently rate controlled  CKD stage IIIb -Creatinine currently stable.  Monitor  Acute metabolic acidosis -Bicarbonate improving.  Monitor.  Anemia of chronic disease -From renal failure.  Hemoglobin currently stable.  Monitor.  Diabetes mellitus type 2 -A1c 6.2.  She is on Jardiance as an outpatient.  Carb modified diet.  Hypothyroidism -Continue levothyroxine  Hyperlipidemia -Continue statin  Morbid obesity -Outpatient  follow-up  Anxiety/depression -Continue buspirone and duloxetine  Physical deconditioning -PT eval   DVT prophylaxis: Eliquis Code Status: Full Family Communication: None at bedside Disposition Plan: Status is: Inpatient Remains inpatient appropriate because: Of severity of illness    Consultants: Cardiology  Procedures: 2D echo  Antimicrobials: Rocephin from 04/27/2023 onwards   Subjective: Patient seen and examined at bedside.  Pain slightly better but still feels swollen in her legs.  No fever, vomiting, chest pain reported.  Does not feel ready to go home yet.  Objective: Vitals:   04/28/23 0836 04/28/23 1246 04/28/23 1952 04/29/23 0357  BP: 123/73 110/68 (!) 144/80 (!) 141/86  Pulse: 89 90 88 (!) 50  Resp: 18  19 19   Temp: (!) 97.4 F (36.3 C) 98.2 F (36.8 C) 97.7 F (36.5 C) 97.8 F (36.6 C)  TempSrc: Oral Oral Oral Oral  SpO2: 97% 97% 98% 100%  Weight:      Height:        Intake/Output Summary (Last 24 hours) at 04/29/2023 0925 Last data filed at 04/29/2023 0020 Gross per 24 hour  Intake 700 ml  Output 1400 ml  Net -700 ml   Filed Weights   04/27/23 1447 04/27/23 2313 04/28/23 0733  Weight: (!) 158.8 kg (!) 138.8 kg (!) 139.5 kg    Examination:  General exam: Appears calm and comfortable.  On room air.  Looks chronically ill and deconditioned. Respiratory system: Bilateral decreased breath sounds at bases with scattered crackles Cardiovascular system: S1 & S2 heard, mild intermittent bradycardia present gastrointestinal system: Abdomen is morbidly obese, nondistended, soft and nontender. Normal bowel sounds heard. Extremities: No cyanosis, clubbing; bilateral lower extremity edema present Central nervous system: Alert and oriented. No focal neurological deficits. Moving extremities Skin: Bilateral lower extremity erythema, tenderness present with dressing  [place], [place] [address]    Report Status PENDING  Incomplete  MRSA Next Gen by PCR, Nasal     Status: Abnormal   Collection Time: 04/28/23  9:06 AM   Specimen: Nasal Mucosa; Nasal Swab  Result Value Ref Range Status   MRSA by PCR Next Gen DETECTED (A) NOT DETECTED Final    Comment: RESULT CALLED TO, READ BACK BY AND VERIFIED WITH: FOUNDER, W.  RN AT 1202 ON 1203 ON 04/28/2023 BY MECIAL J. (NOTE) The GeneXpert MRSA Assay (FDA approved for NASAL specimens only), is one component of a comprehensive MRSA colonization surveillance program. It is not intended to diagnose MRSA infection nor to guide or monitor treatment for MRSA infections. Test performance is not FDA approved in patients less than 33 years old. Performed at [facility], [address], [place], [place] [address]          Radiology Studies: ECHOCARDIOGRAM COMPLETE  Result Date: 04/28/2023    ECHOCARDIOGRAM REPORT   Patient Name:   Hannah [name] Date of Exam: 04/28/2023 Medical Rec #:  [id number]           Height:       66.0 in Accession #:    [id number]          Weight:       306.0 lb Date of Birth:  October 16, 1945           BSA:          2.396 m Patient Age:    77 years            BP:           145/86 mmHg Patient Gender: F                   HR:           82 bpm. Exam Location:  Inpatient Procedure: 2D Echo, Cardiac Doppler, Color Doppler and Intracardiac            Opacification Agent Indications:    CHF-Acute Diastolic I50.31  History:        Patient has prior history of Echocardiogram examinations, most                 recent 01/18/2018. CHF, Arrythmias:Atrial Fibrillation; Risk                 Factors:Diabetes.  Sonographer:    Lucendia Herrlich RCS Referring Phys: [phone] JARED M GARDNER IMPRESSIONS  1. Left ventricular ejection fraction, by estimation, is 55 to 60%. The left ventricle has normal function. Left ventricular endocardial border not optimally defined to evaluate regional wall motion. There is mild concentric left ventricular hypertrophy. Left ventricular diastolic parameters are indeterminate. There was not sufficient IV access for Definity contrast imaging.  2. Right ventricular systolic function is normal. The right ventricular size is not well visualized. There is moderately elevated pulmonary artery systolic pressure. The estimated right ventricular systolic pressure is 54.9 mmHg.  3. Left atrial size was moderately dilated.  4. The mitral valve is grossly normal. Moderate mitral valve regurgitation. No evidence of mitral stenosis.  5. Tricuspid valve regurgitation is moderate to severe.  6. The aortic valve is tricuspid. Aortic valve regurgitation is not visualized. Aortic valve sclerosis is present, with no evidence of  aortic valve stenosis.  7. The inferior vena cava is dilated in size with <50% respiratory variability, suggesting right atrial pressure of 15 mmHg. Comparison(s): Prior images reviewed side by side.

## 2023-04-29 NOTE — TOC CM/SW Note (Signed)
30 Day Passar Note  ZO:XWRUEAVW [name] Date of Birth:[birth year] Date:04/29/2023  To Whom It May Concern:  Please be advised that the above-named patient will require a short-term nursing home stay - anticipated 30 days or less for rehabilitation and strengthening.  The plan is for return home.

## 2023-04-29 NOTE — TOC Progression Note (Signed)
Transition of Care [facility]) - Progression Note    Patient Details  Name: Koa Palla MRN: [id number] Date of Birth: 05-17-1946  Transition of Care [facility]) CM/SW Contact  Larrie Kass, LCSW Phone Number: 04/29/2023, 4:11 PM  Clinical Narrative:     CSW spoke with pt's daughter and presented bed offers , she is requesting time to review. [facility] is requesting additional clinicals. Clinicals have been uploaded.to [place] [facility], [facility] pending. TOC to follow.   Expected Discharge Plan: Skilled Nursing Facility Barriers to Discharge: Continued Medical Work up  Expected Discharge Plan and Services       Living arrangements for the past 2 months: Skilled Nursing Facility                                       Social Determinants of Health (SDOH) Interventions SDOH Screenings   Food Insecurity: No Food Insecurity (04/27/2023)  Housing: Low Risk  (04/27/2023)  Transportation Needs: No Transportation Needs (04/27/2023)  Utilities: Not At Risk (04/27/2023)  Social Connections: Unknown (11/26/2021)   Received from [facility]  Tobacco Use: Low Risk  (04/27/2023)    Readmission Risk Interventions     No data to display

## 2023-04-29 NOTE — Progress Notes (Signed)
Heart Failure Navigator Progress Note  Assessed for Heart & Vascular TOC clinic readiness.  Patient does not meet criteria due to admission with bilateral leg wounds, cellulitis. EF 55-60%, Recommendations for SNF for rehab at discharge. .   Navigator will sign off at this time.   Rhae Hammock, BSN, Scientist, clinical (histocompatibility and immunogenetics) Only
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
## 2023-04-29 NOTE — TOC Initial Note (Signed)
Transition of Care [facility]) - Initial/Assessment Note    Patient Details  Name: Hannah [name] MRN: [id number] Date of Birth: Sep 07, 1945  Transition of Care [facility]) CM/SW Contact:    Larrie Kass, LCSW Phone Number: 04/29/2023, 11:31 AM  Clinical Narrative:                 CSW met with pt to discuss recommendations for short term rehab.  CSW explained the process, pt will need insurance authorization. Pt has no preference, and has agreed for her information to be sent out to the [facility] area. Pt requested CSW to provide her daughter with an update. CSW to fax pt out for SNF placement. TOC to follow.   Expected Discharge Plan: Skilled Nursing Facility Barriers to Discharge: Continued Medical Work up   Patient Goals and CMS Choice Patient states their goals for this hospitalization and ongoing recovery are:: SNF to get stronger CMS Medicare.gov Compare Post Acute Care list provided to:: Patient Choice offered to / list presented to : Patient      Expected Discharge Plan and Services       Living arrangements for the past 2 months: Skilled Nursing Facility                                      Prior Living Arrangements/Services Living arrangements for the past 2 months: Skilled Nursing Facility Lives with:: Self Patient language and need for interpreter reviewed:: Yes Do you feel safe going back to the place where you live?: Yes      Need for Family Participation in Patient Care: No (Comment) Care giver support system in place?: No (comment) Current home services: DME Criminal Activity/Legal Involvement Pertinent to Current Situation/Hospitalization: No - Comment as needed  Activities of Daily Living   ADL Screening (condition at time of admission) Independently performs ADLs?: No Does the patient have a NEW difficulty with bathing/dressing/toileting/self-feeding that is expected to last >3 days?: Yes (Initiates electronic notice to provider for  possible OT consult) Does the patient have a NEW difficulty with getting in/out of bed, walking, or climbing stairs that is expected to last >3 days?: Yes (Initiates electronic notice to provider for possible PT consult) Does the patient have a NEW difficulty with communication that is expected to last >3 days?: No Is the patient deaf or have difficulty hearing?: No Does the patient have difficulty seeing, even when wearing glasses/contacts?: No Does the patient have difficulty concentrating, remembering, or making decisions?: Yes  Permission Sought/Granted                  Emotional Assessment Appearance:: Appears stated age Attitude/Demeanor/Rapport: Engaged, Charismatic, Gracious Affect (typically observed): Accepting Orientation: : Oriented to Self, Oriented to Place, Oriented to Situation   Psych Involvement: No (comment)  Admission diagnosis:  Leg swelling [M79.89] Acute on chronic diastolic CHF (congestive heart failure) (HCC) [I50.33] Cellulitis of lower extremity, unspecified laterality [L03.119] Patient Active Problem List   Diagnosis Date Noted   Acute on chronic diastolic CHF (congestive heart failure) (HCC) 04/28/2023   Acute metabolic encephalopathy 04/27/2023   Pain due to onychomycosis of toenails of both feet 06/10/2021   Pressure injury of skin 04/22/2021   Septic arthritis (HCC) 04/21/2021   Symptomatic anemia 04/20/2021   Cellulitis 04/20/2021   Secondary hypercoagulable state (HCC) 01/28/2021   Persistent atrial fibrillation (HCC) 01/28/2021   Acute on chronic diastolic heart failure (HCC)  11/24/2019   PAF (paroxysmal atrial fibrillation) (HCC) 11/24/2019   Hypothyroidism 11/24/2019   DMII (diabetes mellitus, type 2) (HCC) 11/24/2019   Acute respiratory failure (HCC) 12/07/2018   PTSD (post-traumatic stress disorder) 08/29/2017   Atrial fibrillation (HCC) [I48.91] 06/16/2017   Long term (current) use of anticoagulants [Z79.01] 06/16/2017   Chronic  venous insufficiency 09/18/2016   CHF (congestive heart failure) (HCC) 09/13/2016   Community acquired pneumonia 09/13/2016   Cellulitis of right lower extremity 09/13/2016   PCP:  Darrow Bussing, MD Pharmacy:   [facility] [id number] - [address] [place] - [address] [place] [address] Phone: [phone] Fax: [phone]  [facility] [place], [place] - [address] [place] [address] Phone: [phone] Fax: [phone]     Social Determinants of Health (SDOH) Social History: SDOH Screenings   Food Insecurity: No Food Insecurity (04/27/2023)  Housing: Low Risk  (04/27/2023)  Transportation Needs: No Transportation Needs (04/27/2023)  Utilities: Not At Risk (04/27/2023)  Social Connections: Unknown (11/26/2021)   Received from [facility]  Tobacco Use: Low Risk  (04/27/2023)   SDOH Interventions:     Readmission Risk Interventions     No data to display

## 2023-04-30 DIAGNOSIS — L03119 Cellulitis of unspecified part of limb: Secondary | ICD-10-CM

## 2023-04-30 DIAGNOSIS — I10 Essential (primary) hypertension: Secondary | ICD-10-CM

## 2023-04-30 DIAGNOSIS — I4819 Other persistent atrial fibrillation: Secondary | ICD-10-CM | POA: Diagnosis not present

## 2023-04-30 DIAGNOSIS — E7800 Pure hypercholesterolemia, unspecified: Secondary | ICD-10-CM

## 2023-04-30 DIAGNOSIS — R600 Localized edema: Secondary | ICD-10-CM

## 2023-04-30 DIAGNOSIS — I5033 Acute on chronic diastolic (congestive) heart failure: Secondary | ICD-10-CM | POA: Diagnosis not present

## 2023-04-30 DIAGNOSIS — G9341 Metabolic encephalopathy: Secondary | ICD-10-CM | POA: Diagnosis not present

## 2023-04-30 DIAGNOSIS — I872 Venous insufficiency (chronic) (peripheral): Secondary | ICD-10-CM

## 2023-04-30 DIAGNOSIS — Z91199 Patient's noncompliance with other medical treatment and regimen due to unspecified reason: Secondary | ICD-10-CM

## 2023-04-30 LAB — GLUCOSE, CAPILLARY
Glucose-Capillary: 111 mg/dL — ABNORMAL HIGH (ref 70–99)
Glucose-Capillary: 137 mg/dL — ABNORMAL HIGH (ref 70–99)
Glucose-Capillary: 113 mg/dL — ABNORMAL HIGH (ref 70–99)
Glucose-Capillary: 124 mg/dL — ABNORMAL HIGH (ref 70–99)
Glucose-Capillary: 115 mg/dL — ABNORMAL HIGH (ref 70–99)
Glucose-Capillary: 115 mg/dL — ABNORMAL HIGH (ref 70–99)

## 2023-04-30 LAB — BASIC METABOLIC PANEL
Anion gap: 11 (ref 5–15)
BUN: 41 mg/dL — ABNORMAL HIGH (ref 8–23)
CO2: 20 mmol/L — ABNORMAL LOW (ref 22–32)
Calcium: 8.1 mg/dL — ABNORMAL LOW (ref 8.9–10.3)
Chloride: 109 mmol/L (ref 98–111)
Creatinine, Ser: 1.19 mg/dL — ABNORMAL HIGH (ref 0.44–1.00)
GFR, Estimated: 47 mL/min — ABNORMAL LOW (ref >60)
Glucose, Bld: 117 mg/dL — ABNORMAL HIGH (ref 70–99)
Potassium: 3.6 mmol/L (ref 3.5–5.1)
Sodium: 140 mmol/L (ref 135–145)

## 2023-04-30 LAB — LIPID PANEL
Cholesterol: 74 mg/dL (ref 0–200)
HDL: 29 mg/dL — ABNORMAL LOW (ref >40)
LDL Cholesterol: 35 mg/dL (ref 0–99)
Total CHOL/HDL Ratio: 2.6 {ratio}
Triglycerides: 52 mg/dL (ref <150)
VLDL: 10 mg/dL (ref 0–40)

## 2023-04-30 LAB — C-REACTIVE PROTEIN: CRP: 7.6 mg/dL — ABNORMAL HIGH (ref <1.0)

## 2023-04-30 LAB — MAGNESIUM: Magnesium: 2.3 mg/dL (ref 1.7–2.4)

## 2023-04-30 MED ORDER — LIDOCAINE 5 % EX PTCH
1.0000 | MEDICATED_PATCH | CUTANEOUS | Status: DC
  Administered 2023-04-30 – 2023-05-08 (×9): 1 via TRANSDERMAL
  Filled 2023-04-30 (×8): qty 1

## 2023-04-30 MED ORDER — TRAMADOL HCL 50 MG PO TABS
50.0000 mg | ORAL_TABLET | Freq: Four times a day (QID) | ORAL | Status: DC | PRN
  Administered 2023-04-30 – 2023-05-08 (×16): 50 mg via ORAL
  Filled 2023-04-30 (×19): qty 1

## 2023-04-30 MED ORDER — ACETAMINOPHEN 325 MG PO TABS
650.0000 mg | ORAL_TABLET | ORAL | Status: DC | PRN
  Administered 2023-04-30 – 2023-05-04 (×7): 650 mg via ORAL
  Filled 2023-04-30 (×7): qty 2

## 2023-04-30 MED ORDER — POTASSIUM CHLORIDE CRYS ER 20 MEQ PO TBCR
40.0000 meq | EXTENDED_RELEASE_TABLET | Freq: Once | ORAL | Status: AC
  Administered 2023-04-30: 40 meq via ORAL
  Filled 2023-04-30: qty 2

## 2023-04-30 MED ORDER — MELATONIN 5 MG PO TABS
5.0000 mg | ORAL_TABLET | Freq: Every day | ORAL | Status: DC
  Administered 2023-05-01 – 2023-05-07 (×8): 5 mg via ORAL
  Filled 2023-04-30 (×8): qty 1

## 2023-04-30 NOTE — Progress Notes (Signed)
Progress Note  Patient Name: Hannah [name] Date of Encounter: 04/30/2023  [facility] Cardiologist: Will Jorja Loa, MD    Subjective   Feeling better today.  Legs less red and less edema.  Good diuresis overnight 2 L out yesterday and net -1.9 L since admission. Inpatient Medications    Scheduled Meds:  apixaban  5 mg Oral BID   busPIRone  15 mg Oral TID   Chlorhexidine Gluconate Cloth  6 each Topical Q0600   DULoxetine  90 mg Oral Daily   ferrous sulfate  325 mg Oral BID WC   furosemide  80 mg Intravenous BID   insulin aspart  0-9 Units Subcutaneous Q4H   levothyroxine  125 mcg Oral Q0600   lidocaine  1 patch Transdermal Q24H   montelukast  10 mg Oral QHS   mupirocin ointment  1 Application Nasal BID   rosuvastatin  5 mg Oral Daily   sodium chloride flush  3 mL Intravenous Q12H   Continuous Infusions:  cefTRIAXone (ROCEPHIN)  IV 2 g (04/30/23 1133)   PRN Meds: acetaminophen, ondansetron (ZOFRAN) IV, sodium chloride flush, traMADol   Vital Signs    Vitals:   04/29/23 1411 04/29/23 2001 04/30/23 0351 04/30/23 0500  BP: 138/80 116/70 117/81   Pulse: 89 80 82   Resp: 20 18    Temp: 98.2 F (36.8 C) 98.4 F (36.9 C) 97.6 F (36.4 C)   TempSrc: Oral Oral Oral   SpO2: 100% 97% 100%   Weight:    135.7 kg  Height:        Intake/Output Summary (Last 24 hours) at 04/30/2023 1254 Last data filed at 04/30/2023 0626 Gross per 24 hour  Intake 480 ml  Output 2050 ml  Net -1570 ml      04/30/2023    5:00 AM 04/28/2023    7:33 AM 04/27/2023   11:13 PM  Last 3 Weights  Weight (lbs) 299 lb 2.6 oz 307 lb 8.7 oz 306 lb  Weight (kg) 135.7 kg 139.5 kg 138.8 kg      Telemetry    Atrial fibrillation with controlled ventricular response- Personally Reviewed  ECG    Block- Personally Reviewed  Physical Exam   GEN: No acute distress.   Neck: No JVD Cardiac: Irregularly irregular, no murmurs, rubs, or gallops.  Respiratory: Clear to auscultation  bilaterally. GI: Soft, nontender, non-distended  MS: Erythema of the lower extremities bilaterally which is improved from yesterday and 1-2+ pitting edema improved from yesterday Neuro:  Nonfocal  Psych: Normal affect   Labs    High Sensitivity Troponin:  No results for input(s): "TROPONINIHS" in the last 720 hours.    Chemistry Recent Labs  Lab 04/27/23 1519 04/28/23 0356 04/29/23 0404 04/30/23 0407  NA 143 138 141 140  K 4.4 4.4 4.1 3.6  CL 113* 112* 110 109  CO2 20* 16* 21* 20*  GLUCOSE 142* 120* 128* 117*  BUN 36* 37* 43* 41*  CREATININE 1.31* 1.42* 1.39* 1.19*  CALCIUM 9.1 8.6* 8.6* 8.1*  PROT 8.0  --   --   --   ALBUMIN 3.6  --   --   --   AST 21  --   --   --   ALT 20  --   --   --   ALKPHOS 62  --   --   --   BILITOT 1.8*  --   --   --   GFRNONAA 42* 38* 39* 47*  ANIONGAP  10 10 10 11      Hematology Recent Labs  Lab 04/27/23 1519 04/29/23 0404  WBC 10.9* 7.3  RBC 3.57* 3.44*  HGB 10.0* 9.5*  HCT 34.3* 33.2*  MCV 96.1 96.5  MCH 28.0 27.6  MCHC 29.2* 28.6*  RDW 15.9* 16.1*  PLT 199 194    BNP Recent Labs  Lab 04/27/23 1519  BNP 314.2*     DDimer No results for input(s): "DDIMER" in the last 168 hours.   CHA2DS2-VASc Score = 7   This indicates a 11.2% annual risk of stroke. The patient's score is based upon: CHF History: 1 HTN History: 1 Diabetes History: 1 Stroke History: 0 Vascular Disease History: 1 (coronary artery calcifications noted on prior CTA) Age Score: 2 Gender Score: 1      Radiology    No results found.  Patient Profile     77 y.o. female with a history of chronic diastolic CHF, paroxysmal atrial fibrillation on Tikosyn and Eliquis, moderate mitral regurgitation, hypertension, hyperlipidemia, type 2 diabetes mellitus, chronic venous insufficiency, hypothyroidism, CKD stage III, obstructive sleep apnea on CPAP, and obesity  who was admitted on 04/27/2023 for bilateral leg cellulitis and delirium with hallucination after  presenting with a fall and lower extremity edema. Cardiology was consulted for further evaluation of acute on chronic CHF at the request of Dr. Caleb Popp.   Assessment & Plan    Acute on Chronic Diastolic CHF Chronic Venous Insufficiency Patient has a history of chronic diastolic CHF and chronic venous insufficiency. She presented with altered mental status and falls but was also noted to have significant lower extremity edema and diagnosed with cellulitis.  Patient had not filled any of her medications at the pharmacy for the current year including torsemide and spironolactone -BNP mildly elevated at 314.  -Echo showed LVEF of 55-60% with mild LVH, normal RV function, moderate mitral regurgitation, moderate to severe tricuspid regurgitation, and moderate PASP.  -Started on Lasix 40 mg IV twice daily but increased to 80 mg IV twice daily to promote better diuresis -She put out 2 L yesterday and is net -1.9 L since admission -weight down 9 lbs from admission -Still volume overloaded on exam -Serum creatinine stable and improved from 1.39>> 1.19; K+3.6  Mag 2.3 -Continue Lasix 80 mg IV twice daily and spironolactone 12.5 mg daily  -Agree with holding home Jardiance. She was found sitting in a recliner covered in urine so worry but increased risk of UTI with this. - Continue to monitor daily weights, strict I/Os, and renal function. -replete potassium to keep >4 and mag >2   Persistent  Atrial Fibrillation Patient has a long history of atrial fibrillation. S/p ablation in 2021. She subsequently had return of atrial fibrillation and was started on Tikosyn in 01/2021. She has been lost to follow-up for the past 2 years. -Presented in rate controlled atrial fibrillation.  -Called Pharmacy and she has not gotten her Tikosyn or Eliquis filled in the year 2024. -Remains in A-fib on telemetry with rate controlled -Continue Eliquis 5 mg twice daily - Would not plan to restart Tikosyn at this time given  medication non-compliance. She has not been seen in our office in over 2 years.  Also cannot guarantee that she has been taking her Eliquis.   -We can have her follow back up with EP after this hospitalization and could consider restarting if she can show that she is compliant with medications and follow-up.   Moderate Mitral Regurgitation Moderate to Severe Tricuspid Regurgitation  Noted on Echo this admission. - Can continue to monitor as an outpatient.   Hypertension BP controlled at 117/81 mmHg - Continue diuresis as above.   Hyperlipidemia -Lipids today showed LDL 35, HDL 29 and triglycerides 52  -Continue Crestor 5 mg daily    Otherwise, per primary team: - Cellulitis - Delirium with hallucinations - Type 2 diabetes mellitus - Obstructive sleep apnea: on CPAP - Morbid obesity    Total time spent with patient today 35 minutes. This includes reviewing records, evaluating the patient and coordinating care. Face-to-face time >50%.  For questions or updates, please contact [facility] Please consult [email or web address] for contact info under        Signed, Armanda Magic, MD  04/30/2023, 12:54 PM

## 2023-04-30 NOTE — Progress Notes (Signed)
Physical Therapy Treatment Patient Details Name: Hannah [name] MRN: [id number] DOB: [birth year] Today's Date: 04/30/2023   History of Present Illness 77 yo female presented to ED on 04/27/2023 due to AMS with hallucinations. Pt was found to have B LE cellulitis and metabolic encephalopathy. Pt UA ahd head CT negative for acute findings, EKG reveals A-fib and incomplete R BBB. Pt PMH includes but is not limited to: A-fib, DM II, glaucoma, HTN, thyroid dz and multiple cardioversion.    PT Comments   The patient received in bed . Assisted  with rolling and changing linens, Max assist of 1  to sit up onto bed edge.  Multiple attempts to stand at [facility], patient reporting 10 pain and unable to rise from the bed. +2 max Assist back into bed . Patient will benefit from continued inpatient follow up therapy, <3 hours/day    If plan is discharge home, recommend the following: Two people to help with walking and/or transfers;A lot of help with bathing/dressing/bathroom;Assistance with cooking/housework;Direct supervision/assist for medications management;Assist for transportation;Help with stairs or ramp for entrance   Can travel by private vehicle     No  Equipment Recommendations  None recommended by PT    Recommendations for Other Services       Precautions / Restrictions Precautions Precautions: Fall Precaution Comments: leg pain, weepy Restrictions Weight Bearing Restrictions: No     Mobility  Bed Mobility   Bed Mobility: Rolling, Supine to Sit, Sit to Supine Rolling: Max assist   Supine to sit: +2 for physical assistance, +2 for safety/equipment, Mod assist Sit to supine: +2 for physical assistance, +2 for safety/equipment, Max assist   General bed mobility comments: assisted  with rolling, use of rails, assist with legs over bed ede, assist with legs and trunk to return to supine    Transfers   Equipment used: Rolling walker (2 wheels) Transfers: Sit to/from Stand              General transfer comment: max +2 attempts to stnad from bed x 4, patient  unable to clear bed.    Ambulation/Gait                   Stairs             Wheelchair Mobility     Tilt Bed    Modified Rankin (Stroke Patients Only)       Balance Overall balance assessment: Needs assistance, History of Falls Sitting-balance support: Feet supported Sitting balance-Leahy Scale: Fair                                      Cognition Arousal: Alert Behavior During Therapy: WFL for tasks assessed/performed, Anxious Overall Cognitive Status: Within Functional Limits for tasks assessed                                          Exercises General Exercises - Lower Extremity Ankle Circles/Pumps: AROM, Both, 10 reps Heel Slides: AAROM, Both, 10 reps, Supine    General Comments        Pertinent Vitals/Pain Pain Assessment Pain Score: 10-Worst pain ever Pain Location: legs Pain Descriptors / Indicators: Aching, Burning, Constant, Discomfort Pain Intervention(s): Patient requesting pain meds-RN notified, Limited activity within patient's tolerance    Home Living  Prior Function            PT Goals (current goals can now be found in the care plan section) Progress towards PT goals: Progressing toward goals    Frequency    Min 1X/week      PT Plan      Co-evaluation              AM-PAC PT "6 Clicks" Mobility   Outcome Measure  Help needed turning from your back to your side while in a flat bed without using bedrails?: A Lot Help needed moving from lying on your back to sitting on the side of a flat bed without using bedrails?: A Lot Help needed moving to and from a bed to a chair (including a wheelchair)?: Total Help needed standing up from a chair using your arms (e.g., wheelchair or bedside chair)?: Total Help needed to walk in hospital room?: Total Help needed  climbing 3-5 steps with a railing? : Total 6 Click Score: 8    End of Session Equipment Utilized During Treatment: Gait belt Activity Tolerance: Patient limited by pain;Patient limited by fatigue Patient left: in bed;with call bell/phone within reach;with bed alarm set Nurse Communication: Mobility status;Need for lift equipment PT Visit Diagnosis: Unsteadiness on feet (R26.81);Other abnormalities of gait and mobility (R26.89);Muscle weakness (generalized) (M62.81);History of falling (Z91.81);Difficulty in walking, not elsewhere classified (R26.2);Pain Pain - part of body: Knee;Leg;Ankle and joints of foot     Time: 1019-1105 PT Time Calculation (min) (ACUTE ONLY): 46 min  Charges:    $Therapeutic Activity: 23-37 mins $Self Care/Home Management: 8-22 PT General Charges $$ ACUTE PT VISIT: 1 Visit                     Blanchard Kelch PT Acute Rehabilitation Services Office [phone] Weekend [phone]    Rada Hay 04/30/2023, 11:41 AM

## 2023-04-30 NOTE — Plan of Care (Signed)

## 2023-04-30 NOTE — Progress Notes (Signed)
Occupational Therapy Treatment Patient Details Name: Hannah [name] MRN: [id number] DOB: 08-20-1945 Today's Date: 04/30/2023   History of present illness 77 yr old female presented to ED on 04/27/2023 due to AMS with hallucinations. Pt was found to have B LE cellulitis and metabolic encephalopathy. EKG reveals A-fib and incomplete R BBB. Pt PMH includes but is not limited to: A-fib, DM II, glaucoma, HTN, thyroid dz and multiple cardioversion.   OT comments  The pt was limited by significant BLE pain, which was worse to touch and with progressive activity (RLE pain  >than LLE). She required increased assist for rolling in bed, to don her socks, and to perform supine to sit. Once seated EOB, she performed upper body grooming with set-up assist. She was unable to tolerate standing attempts, due to pain. She was instructed on lateral scooting along the EOB, however she was unable to clear her buttocks off the bed, in order to perform. Continue OT plan of care. Patient will benefit from continued inpatient follow up therapy, <3 hours/day.       If plan is discharge home, recommend the following:  A lot of help with walking and/or transfers;A lot of help with bathing/dressing/bathroom;Assist for transportation;Help with stairs or ramp for entrance;Assistance with cooking/housework;Direct supervision/assist for medications management   Equipment Recommendations  Other (comment) (defer to next level of care)    Recommendations for Other Services      Precautions / Restrictions Precautions Precautions: Fall Precaution Comments: leg pain, weepy Restrictions Weight Bearing Restrictions: No       Mobility Bed Mobility Overal bed mobility: Needs Assistance Bed Mobility: Rolling, Supine to Sit, Sit to Supine Rolling: Mod assist, Used rails   Supine to sit: Max assist, HOB elevated, Used rails Sit to supine: Max assist, Used rails        Transfers        General transfer comment:  the pt deferred standing attempts, due to reports of 10/10 BLE pain. She was instructed on lateral scooting along the EOB, however she was unable to clear her buttocks off the bed in order to perform         ADL either performed or assessed with clinical judgement   ADL Overall ADL's : Needs assistance/impaired Eating/Feeding: Independent;Sitting Eating/Feeding Details (indicate cue type and reason): based on clinical judgement Grooming: Set up;Sitting Grooming Details (indicate cue type and reason): She performed face washing and teeth brushing seated EOB.         Upper Body Dressing : Minimal assistance Upper Body Dressing Details (indicate cue type and reason): simulated seated EOB Lower Body Dressing: Total assistance Lower Body Dressing Details (indicate cue type and reason): for sock management seated EOB     Toileting- Clothing Manipulation and Hygiene: Maximal assistance;Total assistance;Bed level                Cognition Arousal: Alert Behavior During Therapy: Anxious   Area of Impairment: Attention      Following Commands: Follows one step commands consistently       General Comments: required intermittent redirection to tasks, due to being tangential                   Pertinent Vitals/ Pain       Pain Assessment Pain Assessment: 0-10 Pain Score: 10-Worst pain ever Pain Location: legs Pain Intervention(s): Limited activity within patient's tolerance, Monitored during session, Repositioned, Other (comment) (She reported receiving pain medication.)         Frequency  Min 1X/week        Progress Toward Goals  OT Goals(current goals can now be found in the care plan section)     Acute Rehab OT Goals Patient Stated Goal: decreased pain and to get bettert OT Goal Formulation: With patient Time For Goal Achievement: 05/12/23 Potential to Achieve Goals: Good  Plan         AM-PAC OT "6 Clicks" Daily Activity     Outcome Measure    Help from another person eating meals?: None Help from another person taking care of personal grooming?: A Little Help from another person toileting, which includes using toliet, bedpan, or urinal?: A Lot Help from another person bathing (including washing, rinsing, drying)?: A Lot Help from another person to put on and taking off regular upper body clothing?: A Little Help from another person to put on and taking off regular lower body clothing?: Total 6 Click Score: 15    End of Session Equipment Utilized During Treatment: Other (comment) (N/A)  OT Visit Diagnosis: History of falling (Z91.81);Pain;Unsteadiness on feet (R26.81) Pain - Right/Left: Right Pain - part of body: Leg   Activity Tolerance Patient limited by pain   Patient Left in bed;with call bell/phone within reach;with bed alarm set   Nurse Communication Mobility status        Time: 1610-9604 OT Time Calculation (min): 29 min  Charges: OT General Charges $OT Visit: 1 Visit OT Treatments $Self Care/Home Management : 8-22 mins $Therapeutic Activity: 8-22 mins     Reuben Likes, OTR/L 04/30/2023, 4:15 PM

## 2023-04-30 NOTE — Plan of Care (Signed)
Problem: Health Behavior/Discharge Planning: Goal: Ability to manage health-related needs will improve Outcome: Progressing   Problem: Nutrition: Goal: Adequate nutrition will be maintained Outcome: Progressing   Problem: Coping: Goal: Level of anxiety will decrease Outcome: Progressing

## 2023-04-30 NOTE — Plan of Care (Signed)

## 2023-04-30 NOTE — TOC Progression Note (Signed)
Transition of Care [facility]) - Progression Note    Patient Details  Name: Hannah [name] MRN: [id number] Date of Birth: July 10, 1946  [facility] CM/SW Contact  Larrie Kass, LCSW Phone Number: 04/30/2023, 4:05 PM  Clinical Narrative:     CSW spoke with pt's daughter Jennette Kettle she reports , she is still reviewing pt's bed offers. She inquired about how long pt would be at facility. CSW reminded pt's daughter it is up to how pt progress and the insurance. she is requested MD to call for an update. MD made aware. TOC to follow.    Expected Discharge Plan: Skilled Nursing Facility Barriers to Discharge: Continued Medical Work up  Expected Discharge Plan and Services       Living arrangements for the past 2 months: Skilled Nursing Facility                                       Social Determinants of Health (SDOH) Interventions SDOH Screenings   Food Insecurity: No Food Insecurity (04/27/2023)  Housing: Low Risk  (04/27/2023)  Transportation Needs: No Transportation Needs (04/27/2023)  Utilities: Not At Risk (04/27/2023)  Social Connections: Unknown (11/26/2021)   Received from [facility]  Tobacco Use: Low Risk  (04/27/2023)    Readmission Risk Interventions     No data to display

## 2023-04-30 NOTE — Progress Notes (Signed)
PROGRESS NOTE    Hannah [name]  ZOX:[id number] DOB: [birth year] DOA: 04/27/2023 PCP: Darrow Bussing, MD   Brief Narrative:  77 y.o. female with a history of atrial fibrillation, diabetes mellitus type 2, hypertension, diastolic heart failure, morbid obesity presented with a fall and increased leg swelling and was found to have evidence of bilateral leg cellulitis complicated by chronic leg wound.  Started on IV Rocephin.  Cardiology was consulted.  Assessment & Plan:   Bilateral lower extremity cellulitis -With associated right anterior leg wound.  Wound care as per wound care RN recommendations -CT of both legs confirm cellulitis without evidence of underlying abscesses.  Continue Rocephin: Cellulitis improving but still significant.  Acute on chronic diastolic heart failure -Likely secondary to medication nonadherence.  Strict input and output.  Daily weights.  Fluid restriction.  Negative balance of 1965 cc since admission -Cardiology following: On IV Lasix 80 mg twice a day currently.  Echo showed EF of 55 to 60% with moderate mitral valve regurgitation and moderate to severe tricuspid valve regurgitation  Acute metabolic encephalopathy/delirium -Likely related to above.  CT head unremarkable for acute process.  Symptoms appear to have resolved.  Persistent A-fib -Apparently patient missed several doses of Tikosyn as an outpatient.  Cardiology following: Recommending not to restart Tikosyn.  Continue Eliquis.  Currently rate controlled  CKD stage IIIb -Creatinine currently stable.  Monitor  Acute metabolic acidosis -Bicarbonate 20 today.  Monitor.  Anemia of chronic disease -From renal failure.  Hemoglobin currently stable.  Monitor.  Diabetes mellitus type 2 -A1c 6.2.  She is on Jardiance as an outpatient.  Carb modified diet.  Hypothyroidism -Continue levothyroxine  Hyperlipidemia -Continue statin  Morbid obesity -Outpatient  follow-up  Anxiety/depression -Continue buspirone and duloxetine  Physical deconditioning -PT recommends SNF placement.  TOC consulted.   DVT prophylaxis: Eliquis Code Status: Full Family Communication: None at bedside Disposition Plan: Status is: Inpatient Remains inpatient appropriate because: Of severity of illness    Consultants: Cardiology  Procedures: 2D echo  Antimicrobials: Rocephin from 04/27/2023 onwards   Subjective: Patient seen and examined at bedside.  Denies chest pain, worsening shortness breath, fever.  Still complains of lower extremity pain and swelling.   Objective: Vitals:   04/29/23 1411 04/29/23 2001 04/30/23 0351 04/30/23 0500  BP: 138/80 116/70 117/81   Pulse: 89 80 82   Resp: 20 18    Temp: 98.2 F (36.8 C) 98.4 F (36.9 C) 97.6 F (36.4 C)   TempSrc: Oral Oral Oral   SpO2: 100% 97% 100%   Weight:    135.7 kg  Height:        Intake/Output Summary (Last 24 hours) at 04/30/2023 0821 Last data filed at 04/30/2023 0626 Gross per 24 hour  Intake 480 ml  Output 2050 ml  Net -1570 ml   Filed Weights   04/27/23 2313 04/28/23 0733 04/30/23 0500  Weight: (!) 138.8 kg (!) 139.5 kg 135.7 kg    Examination:  General: On room air currently.  No distress.  Looks chronically ill and deconditioned. ENT/neck: No thyromegaly.  JVD is not elevated  respiratory: Decreased breath sounds at bases bilaterally with some crackles; no wheezing  CVS: S1-S2 heard, rate controlled currently Abdominal: Soft, morbidly obese, nontender, slightly distended; no organomegaly, normal bowel sounds are heard Extremities: Bilateral lower extremity edema; no cyanosis  CNS: Awake and alert.  No focal neurologic deficit.  Moves extremities Lymph: No obvious lymphadenopathy Skin: Bilateral lower extremity chronic skin changes along with erythema, tenderness and  PROGRESS NOTE    Hannah [name]  ZOX:[id number] DOB: [birth year] DOA: 04/27/2023 PCP: Darrow Bussing, MD   Brief Narrative:  77 y.o. female with a history of atrial fibrillation, diabetes mellitus type 2, hypertension, diastolic heart failure, morbid obesity presented with a fall and increased leg swelling and was found to have evidence of bilateral leg cellulitis complicated by chronic leg wound.  Started on IV Rocephin.  Cardiology was consulted.  Assessment & Plan:   Bilateral lower extremity cellulitis -With associated right anterior leg wound.  Wound care as per wound care RN recommendations -CT of both legs confirm cellulitis without evidence of underlying abscesses.  Continue Rocephin: Cellulitis improving but still significant.  Acute on chronic diastolic heart failure -Likely secondary to medication nonadherence.  Strict input and output.  Daily weights.  Fluid restriction.  Negative balance of 1965 cc since admission -Cardiology following: On IV Lasix 80 mg twice a day currently.  Echo showed EF of 55 to 60% with moderate mitral valve regurgitation and moderate to severe tricuspid valve regurgitation  Acute metabolic encephalopathy/delirium -Likely related to above.  CT head unremarkable for acute process.  Symptoms appear to have resolved.  Persistent A-fib -Apparently patient missed several doses of Tikosyn as an outpatient.  Cardiology following: Recommending not to restart Tikosyn.  Continue Eliquis.  Currently rate controlled  CKD stage IIIb -Creatinine currently stable.  Monitor  Acute metabolic acidosis -Bicarbonate 20 today.  Monitor.  Anemia of chronic disease -From renal failure.  Hemoglobin currently stable.  Monitor.  Diabetes mellitus type 2 -A1c 6.2.  She is on Jardiance as an outpatient.  Carb modified diet.  Hypothyroidism -Continue levothyroxine  Hyperlipidemia -Continue statin  Morbid obesity -Outpatient  follow-up  Anxiety/depression -Continue buspirone and duloxetine  Physical deconditioning -PT recommends SNF placement.  TOC consulted.   DVT prophylaxis: Eliquis Code Status: Full Family Communication: None at bedside Disposition Plan: Status is: Inpatient Remains inpatient appropriate because: Of severity of illness    Consultants: Cardiology  Procedures: 2D echo  Antimicrobials: Rocephin from 04/27/2023 onwards   Subjective: Patient seen and examined at bedside.  Denies chest pain, worsening shortness breath, fever.  Still complains of lower extremity pain and swelling.   Objective: Vitals:   04/29/23 1411 04/29/23 2001 04/30/23 0351 04/30/23 0500  BP: 138/80 116/70 117/81   Pulse: 89 80 82   Resp: 20 18    Temp: 98.2 F (36.8 C) 98.4 F (36.9 C) 97.6 F (36.4 C)   TempSrc: Oral Oral Oral   SpO2: 100% 97% 100%   Weight:    135.7 kg  Height:        Intake/Output Summary (Last 24 hours) at 04/30/2023 0821 Last data filed at 04/30/2023 0626 Gross per 24 hour  Intake 480 ml  Output 2050 ml  Net -1570 ml   Filed Weights   04/27/23 2313 04/28/23 0733 04/30/23 0500  Weight: (!) 138.8 kg (!) 139.5 kg 135.7 kg    Examination:  General: On room air currently.  No distress.  Looks chronically ill and deconditioned. ENT/neck: No thyromegaly.  JVD is not elevated  respiratory: Decreased breath sounds at bases bilaterally with some crackles; no wheezing  CVS: S1-S2 heard, rate controlled currently Abdominal: Soft, morbidly obese, nontender, slightly distended; no organomegaly, normal bowel sounds are heard Extremities: Bilateral lower extremity edema; no cyanosis  CNS: Awake and alert.  No focal neurologic deficit.  Moves extremities Lymph: No obvious lymphadenopathy Skin: Bilateral lower extremity chronic skin changes along with erythema, tenderness and  PROGRESS NOTE    Hannah [name]  ZOX:[id number] DOB: [birth year] DOA: 04/27/2023 PCP: Darrow Bussing, MD   Brief Narrative:  77 y.o. female with a history of atrial fibrillation, diabetes mellitus type 2, hypertension, diastolic heart failure, morbid obesity presented with a fall and increased leg swelling and was found to have evidence of bilateral leg cellulitis complicated by chronic leg wound.  Started on IV Rocephin.  Cardiology was consulted.  Assessment & Plan:   Bilateral lower extremity cellulitis -With associated right anterior leg wound.  Wound care as per wound care RN recommendations -CT of both legs confirm cellulitis without evidence of underlying abscesses.  Continue Rocephin: Cellulitis improving but still significant.  Acute on chronic diastolic heart failure -Likely secondary to medication nonadherence.  Strict input and output.  Daily weights.  Fluid restriction.  Negative balance of 1965 cc since admission -Cardiology following: On IV Lasix 80 mg twice a day currently.  Echo showed EF of 55 to 60% with moderate mitral valve regurgitation and moderate to severe tricuspid valve regurgitation  Acute metabolic encephalopathy/delirium -Likely related to above.  CT head unremarkable for acute process.  Symptoms appear to have resolved.  Persistent A-fib -Apparently patient missed several doses of Tikosyn as an outpatient.  Cardiology following: Recommending not to restart Tikosyn.  Continue Eliquis.  Currently rate controlled  CKD stage IIIb -Creatinine currently stable.  Monitor  Acute metabolic acidosis -Bicarbonate 20 today.  Monitor.  Anemia of chronic disease -From renal failure.  Hemoglobin currently stable.  Monitor.  Diabetes mellitus type 2 -A1c 6.2.  She is on Jardiance as an outpatient.  Carb modified diet.  Hypothyroidism -Continue levothyroxine  Hyperlipidemia -Continue statin  Morbid obesity -Outpatient  follow-up  Anxiety/depression -Continue buspirone and duloxetine  Physical deconditioning -PT recommends SNF placement.  TOC consulted.   DVT prophylaxis: Eliquis Code Status: Full Family Communication: None at bedside Disposition Plan: Status is: Inpatient Remains inpatient appropriate because: Of severity of illness    Consultants: Cardiology  Procedures: 2D echo  Antimicrobials: Rocephin from 04/27/2023 onwards   Subjective: Patient seen and examined at bedside.  Denies chest pain, worsening shortness breath, fever.  Still complains of lower extremity pain and swelling.   Objective: Vitals:   04/29/23 1411 04/29/23 2001 04/30/23 0351 04/30/23 0500  BP: 138/80 116/70 117/81   Pulse: 89 80 82   Resp: 20 18    Temp: 98.2 F (36.8 C) 98.4 F (36.9 C) 97.6 F (36.4 C)   TempSrc: Oral Oral Oral   SpO2: 100% 97% 100%   Weight:    135.7 kg  Height:        Intake/Output Summary (Last 24 hours) at 04/30/2023 0821 Last data filed at 04/30/2023 0626 Gross per 24 hour  Intake 480 ml  Output 2050 ml  Net -1570 ml   Filed Weights   04/27/23 2313 04/28/23 0733 04/30/23 0500  Weight: (!) 138.8 kg (!) 139.5 kg 135.7 kg    Examination:  General: On room air currently.  No distress.  Looks chronically ill and deconditioned. ENT/neck: No thyromegaly.  JVD is not elevated  respiratory: Decreased breath sounds at bases bilaterally with some crackles; no wheezing  CVS: S1-S2 heard, rate controlled currently Abdominal: Soft, morbidly obese, nontender, slightly distended; no organomegaly, normal bowel sounds are heard Extremities: Bilateral lower extremity edema; no cyanosis  CNS: Awake and alert.  No focal neurologic deficit.  Moves extremities Lymph: No obvious lymphadenopathy Skin: Bilateral lower extremity chronic skin changes along with erythema, tenderness and  PROGRESS NOTE    Hannah [name]  ZOX:[id number] DOB: [birth year] DOA: 04/27/2023 PCP: Darrow Bussing, MD   Brief Narrative:  77 y.o. female with a history of atrial fibrillation, diabetes mellitus type 2, hypertension, diastolic heart failure, morbid obesity presented with a fall and increased leg swelling and was found to have evidence of bilateral leg cellulitis complicated by chronic leg wound.  Started on IV Rocephin.  Cardiology was consulted.  Assessment & Plan:   Bilateral lower extremity cellulitis -With associated right anterior leg wound.  Wound care as per wound care RN recommendations -CT of both legs confirm cellulitis without evidence of underlying abscesses.  Continue Rocephin: Cellulitis improving but still significant.  Acute on chronic diastolic heart failure -Likely secondary to medication nonadherence.  Strict input and output.  Daily weights.  Fluid restriction.  Negative balance of 1965 cc since admission -Cardiology following: On IV Lasix 80 mg twice a day currently.  Echo showed EF of 55 to 60% with moderate mitral valve regurgitation and moderate to severe tricuspid valve regurgitation  Acute metabolic encephalopathy/delirium -Likely related to above.  CT head unremarkable for acute process.  Symptoms appear to have resolved.  Persistent A-fib -Apparently patient missed several doses of Tikosyn as an outpatient.  Cardiology following: Recommending not to restart Tikosyn.  Continue Eliquis.  Currently rate controlled  CKD stage IIIb -Creatinine currently stable.  Monitor  Acute metabolic acidosis -Bicarbonate 20 today.  Monitor.  Anemia of chronic disease -From renal failure.  Hemoglobin currently stable.  Monitor.  Diabetes mellitus type 2 -A1c 6.2.  She is on Jardiance as an outpatient.  Carb modified diet.  Hypothyroidism -Continue levothyroxine  Hyperlipidemia -Continue statin  Morbid obesity -Outpatient  follow-up  Anxiety/depression -Continue buspirone and duloxetine  Physical deconditioning -PT recommends SNF placement.  TOC consulted.   DVT prophylaxis: Eliquis Code Status: Full Family Communication: None at bedside Disposition Plan: Status is: Inpatient Remains inpatient appropriate because: Of severity of illness    Consultants: Cardiology  Procedures: 2D echo  Antimicrobials: Rocephin from 04/27/2023 onwards   Subjective: Patient seen and examined at bedside.  Denies chest pain, worsening shortness breath, fever.  Still complains of lower extremity pain and swelling.   Objective: Vitals:   04/29/23 1411 04/29/23 2001 04/30/23 0351 04/30/23 0500  BP: 138/80 116/70 117/81   Pulse: 89 80 82   Resp: 20 18    Temp: 98.2 F (36.8 C) 98.4 F (36.9 C) 97.6 F (36.4 C)   TempSrc: Oral Oral Oral   SpO2: 100% 97% 100%   Weight:    135.7 kg  Height:        Intake/Output Summary (Last 24 hours) at 04/30/2023 0821 Last data filed at 04/30/2023 0626 Gross per 24 hour  Intake 480 ml  Output 2050 ml  Net -1570 ml   Filed Weights   04/27/23 2313 04/28/23 0733 04/30/23 0500  Weight: (!) 138.8 kg (!) 139.5 kg 135.7 kg    Examination:  General: On room air currently.  No distress.  Looks chronically ill and deconditioned. ENT/neck: No thyromegaly.  JVD is not elevated  respiratory: Decreased breath sounds at bases bilaterally with some crackles; no wheezing  CVS: S1-S2 heard, rate controlled currently Abdominal: Soft, morbidly obese, nontender, slightly distended; no organomegaly, normal bowel sounds are heard Extremities: Bilateral lower extremity edema; no cyanosis  CNS: Awake and alert.  No focal neurologic deficit.  Moves extremities Lymph: No obvious lymphadenopathy Skin: Bilateral lower extremity chronic skin changes along with erythema, tenderness and  Culture   Final    NO GROWTH 3 DAYS Performed at [facility], [address] [place], [place] [address]    Report Status PENDING  Incomplete  MRSA Next Gen by PCR, Nasal     Status: Abnormal   Collection Time: 04/28/23  9:06 AM   Specimen: Nasal Mucosa; Nasal Swab  Result Value Ref Range Status   MRSA by PCR Next Gen DETECTED (A) NOT DETECTED Final    Comment: RESULT CALLED TO, READ BACK BY AND VERIFIED WITH: FOUNDER, W.  RN AT 1202 ON 1203 ON 04/28/2023 BY MECIAL J. (NOTE) The GeneXpert MRSA Assay (FDA approved for NASAL specimens only), is one component of a comprehensive MRSA colonization surveillance program. It is not intended to diagnose MRSA infection nor to guide or monitor treatment for MRSA infections. Test performance is not FDA approved in patients less than 87  years old. Performed at [facility], [address] [place], [place] [address]          Radiology Studies: ECHOCARDIOGRAM COMPLETE  Result Date: 04/28/2023    ECHOCARDIOGRAM REPORT   Patient Name:   Astrid [name] Date of Exam: 04/28/2023 Medical Rec #:  [id number]           Height:       66.0 in Accession #:    [id number]          Weight:       306.0 lb Date of Birth:  [birth year]           BSA:          2.396 m Patient Age:    77 years            BP:           145/86 mmHg Patient Gender: F                   HR:           82 bpm. Exam Location:  Inpatient Procedure: 2D Echo, Cardiac Doppler, Color Doppler and Intracardiac            Opacification Agent Indications:    CHF-Acute Diastolic I50.31  History:        Patient has prior history of Echocardiogram examinations, most                 recent 01/18/2018. CHF, Arrythmias:Atrial Fibrillation; Risk                 Factors:Diabetes.  Sonographer:    Lucendia Herrlich RCS Referring Phys: [phone] JARED M GARDNER IMPRESSIONS  1. Left ventricular ejection fraction, by estimation, is 55 to 60%. The left ventricle has normal function. Left ventricular endocardial border not optimally defined to evaluate regional wall motion. There is mild concentric left ventricular hypertrophy. Left ventricular diastolic parameters are indeterminate. There was not sufficient IV access for Definity contrast imaging.  2. Right ventricular systolic function is normal. The right ventricular size is not well visualized. There is moderately elevated pulmonary artery systolic pressure. The estimated right ventricular systolic pressure is 54.9 mmHg.  3. Left atrial size was moderately dilated.  4. The mitral valve is grossly normal. Moderate mitral valve regurgitation. No evidence of mitral stenosis.  5. Tricuspid valve regurgitation is moderate to severe.  6. The aortic valve is tricuspid. Aortic valve regurgitation is not visualized. Aortic valve sclerosis is  present, with no evidence of aortic valve stenosis.  7. The inferior vena cava is

## 2023-05-01 ENCOUNTER — Inpatient Hospital Stay (HOSPITAL_COMMUNITY): Payer: 59

## 2023-05-01 DIAGNOSIS — G9341 Metabolic encephalopathy: Secondary | ICD-10-CM | POA: Diagnosis not present

## 2023-05-01 DIAGNOSIS — I5033 Acute on chronic diastolic (congestive) heart failure: Secondary | ICD-10-CM | POA: Diagnosis not present

## 2023-05-01 DIAGNOSIS — I4819 Other persistent atrial fibrillation: Secondary | ICD-10-CM | POA: Diagnosis not present

## 2023-05-01 DIAGNOSIS — L03119 Cellulitis of unspecified part of limb: Secondary | ICD-10-CM | POA: Diagnosis not present

## 2023-05-01 DIAGNOSIS — M7989 Other specified soft tissue disorders: Secondary | ICD-10-CM | POA: Diagnosis not present

## 2023-05-01 DIAGNOSIS — E7800 Pure hypercholesterolemia, unspecified: Secondary | ICD-10-CM | POA: Diagnosis not present

## 2023-05-01 LAB — BASIC METABOLIC PANEL
Anion gap: 9 (ref 5–15)
BUN: 39 mg/dL — ABNORMAL HIGH (ref 8–23)
CO2: 24 mmol/L (ref 22–32)
Calcium: 8.1 mg/dL — ABNORMAL LOW (ref 8.9–10.3)
Chloride: 106 mmol/L (ref 98–111)
Creatinine, Ser: 1.07 mg/dL — ABNORMAL HIGH (ref 0.44–1.00)
GFR, Estimated: 54 mL/min — ABNORMAL LOW (ref >60)
Glucose, Bld: 109 mg/dL — ABNORMAL HIGH (ref 70–99)
Potassium: 3.7 mmol/L (ref 3.5–5.1)
Sodium: 139 mmol/L (ref 135–145)

## 2023-05-01 LAB — GLUCOSE, CAPILLARY
Glucose-Capillary: 105 mg/dL — ABNORMAL HIGH (ref 70–99)
Glucose-Capillary: 106 mg/dL — ABNORMAL HIGH (ref 70–99)
Glucose-Capillary: 118 mg/dL — ABNORMAL HIGH (ref 70–99)
Glucose-Capillary: 123 mg/dL — ABNORMAL HIGH (ref 70–99)
Glucose-Capillary: 168 mg/dL — ABNORMAL HIGH (ref 70–99)
Glucose-Capillary: 97 mg/dL (ref 70–99)

## 2023-05-01 LAB — MAGNESIUM: Magnesium: 2.1 mg/dL (ref 1.7–2.4)

## 2023-05-01 MED ORDER — SPIRONOLACTONE 12.5 MG HALF TABLET
12.5000 mg | ORAL_TABLET | Freq: Every day | ORAL | Status: DC
  Administered 2023-05-01 – 2023-05-08 (×8): 12.5 mg via ORAL
  Filled 2023-05-01 (×8): qty 1

## 2023-05-01 NOTE — Progress Notes (Signed)
PROGRESS NOTE    Hannah [name]  WUJ:[id number] DOB: 1946/03/20 DOA: 04/27/2023 PCP: Darrow Bussing, MD   Brief Narrative:  77 y.o. female with a history of atrial fibrillation, diabetes mellitus type 2, hypertension, diastolic heart failure, morbid obesity presented with a fall and increased leg swelling and was found to have evidence of bilateral leg cellulitis complicated by chronic leg wound.  Started on IV Rocephin.  Cardiology was consulted.  Assessment & Plan:   Bilateral lower extremity cellulitis -With associated right anterior leg wound.  Wound care as per wound care RN recommendations -CT of both legs confirm cellulitis without evidence of underlying abscesses.  Continue Rocephin: Cellulitis improving   Acute on chronic diastolic heart failure -Likely secondary to medication nonadherence.  Strict input and output.  Daily weights.  Fluid restriction.  Negative balance of 3725 cc since admission -Cardiology following: On IV Lasix 80 mg twice a day currently.  Echo showed EF of 55 to 60% with moderate mitral valve regurgitation and moderate to severe tricuspid valve regurgitation  Acute metabolic encephalopathy/delirium -Likely related to above.  CT head unremarkable for acute process.  Symptoms appear to have resolved.  Persistent A-fib -Apparently patient missed several doses of Tikosyn as an outpatient.  Cardiology following: Recommending not to restart Tikosyn.  Continue Eliquis.  Currently rate controlled  CKD stage IIIb -Creatinine currently stable.  Monitor  Acute metabolic acidosis -Improved.  Monitor.  Anemia of chronic disease -From renal failure.  Hemoglobin currently stable.  Monitor.  Diabetes mellitus type 2 -A1c 6.2.  She is on Jardiance as an outpatient.  Carb modified diet.  Hypothyroidism -Continue levothyroxine  Hyperlipidemia -Continue statin  Morbid obesity -Outpatient follow-up  Anxiety/depression -Continue buspirone and  duloxetine  Physical deconditioning -PT recommends SNF placement.  TOC consulted.   DVT prophylaxis: Eliquis Code Status: Full Family Communication: None at bedside Disposition Plan: Status is: Inpatient Remains inpatient appropriate because: Of severity of illness.  Possible discharge in 1 to 3 days if remains stable and cleared by cardiology    Consultants: Cardiology  Procedures: 2D echo  Antimicrobials: Rocephin from 04/27/2023 onwards   Subjective: Patient seen and examined at bedside.  Poor historian.  No fever, agitation, vomiting reported.  Still having lower extremity swelling and pain.   Objective: Vitals:   04/30/23 1631 04/30/23 2038 05/01/23 0137 05/01/23 0414  BP: 120/72 120/78  115/69  Pulse: 69 86  74  Resp: 16 18  19   Temp: 97.9 F (36.6 C) 97.7 F (36.5 C)  97.9 F (36.6 C)  TempSrc: Oral Oral  Oral  SpO2: 100% 100%  97%  Weight:   129.5 kg   Height:        Intake/Output Summary (Last 24 hours) at 05/01/2023 0816 Last data filed at 05/01/2023 0530 Gross per 24 hour  Intake 240 ml  Output 2000 ml  Net -1760 ml   Filed Weights   04/28/23 0733 04/30/23 0500 05/01/23 0137  Weight: (!) 139.5 kg 135.7 kg 129.5 kg    Examination:  General: No acute distress.  Currently on room air.  Looks chronically ill and deconditioned. ENT/neck: No palpable neck masses or elevated JVD noted respiratory: Bilateral decreased breath sounds at bases with basilar crackles  CVS: Rate controlled; S1 and S2 are heard  abdominal: Soft, morbidly obese, nontender, distended mildly; no organomegaly, bowel sounds normally heard Extremities: No clubbing; bilateral lower extremity edema present  CNS: Alert.  Slow to respond.  Poor historian.  No focal neurologic deficit.  Able  to moves extremities Lymph: No palpable lymphadenopathy Skin: Bilateral lower extremity chronic skin changes along with erythema, tenderness and dressing on right anterior shin.  Erythema is  improving. psych: Currently not agitated.  Affect is flat.   Musculoskeletal: No obvious joint tenderness/erythema   Data Reviewed: I have personally reviewed following labs and imaging studies  CBC: Recent Labs  Lab 04/27/23 1519 04/29/23 0404  WBC 10.9* 7.3  NEUTROABS 10.2*  --   HGB 10.0* 9.5*  HCT 34.3* 33.2*  MCV 96.1 96.5  PLT 199 194   Basic Metabolic Panel: Recent Labs  Lab 04/27/23 1519 04/28/23 0356 04/29/23 0404 04/30/23 0407 05/01/23 0346  NA 143 138 141 140 139  K 4.4 4.4 4.1 3.6 3.7  CL 113* 112* 110 109 106  CO2 20* 16* 21* 20* 24  GLUCOSE 142* 120* 128* 117* 109*  BUN 36* 37* 43* 41* 39*  CREATININE 1.31* 1.42* 1.39* 1.19* 1.07*  CALCIUM 9.1 8.6* 8.6* 8.1* 8.1*  MG  --  2.5* 2.4 2.3 2.1   GFR: Estimated Creatinine Clearance: 61.7 mL/min (A) (by C-G formula based on SCr of 1.07 mg/dL (H)). Liver Function Tests: Recent Labs  Lab 04/27/23 1519  AST 21  ALT 20  ALKPHOS 62  BILITOT 1.8*  PROT 8.0  ALBUMIN 3.6   No results for input(s): "LIPASE", "AMYLASE" in the last 168 hours. Recent Labs  Lab 04/27/23 1642  AMMONIA 27   Coagulation Profile: No results for input(s): "INR", "PROTIME" in the last 168 hours. Cardiac Enzymes: No results for input(s): "CKTOTAL", "CKMB", "CKMBINDEX", "TROPONINI" in the last 168 hours. BNP (last 3 results) No results for input(s): "PROBNP" in the last 8760 hours. HbA1C: No results for input(s): "HGBA1C" in the last 72 hours.  CBG: Recent Labs  Lab 04/30/23 1619 04/30/23 2036 04/30/23 2316 05/01/23 0411 05/01/23 0809  GLUCAP 124* 115* 115* 97 106*   Lipid Profile: Recent Labs    04/30/23 0407  CHOL 74  HDL 29*  LDLCALC 35  TRIG 52  CHOLHDL 2.6   Thyroid Function Tests: No results for input(s): "TSH", "T4TOTAL", "FREET4", "T3FREE", "THYROIDAB" in the last 72 hours. Anemia Panel: No results for input(s): "VITAMINB12", "FOLATE", "FERRITIN", "TIBC", "IRON", "RETICCTPCT" in the last 72  hours. Sepsis Labs: Recent Labs  Lab 04/27/23 1537 04/27/23 1750  LATICACIDVEN 1.2 1.1    Recent Results (from the past 240 hour(s))  Blood culture (routine x 2)     Status: None (Preliminary result)   Collection Time: 04/27/23  4:31 PM   Specimen: BLOOD RIGHT HAND  Result Value Ref Range Status   Specimen Description   Final    BLOOD RIGHT HAND Performed at [facility], [address] [place], [place] [address]    Special Requests   Final    BOTTLES DRAWN AEROBIC AND ANAEROBIC Blood Culture adequate volume Performed at [facility], [address] [place], [place] [address]    Culture   Final    NO GROWTH 3 DAYS Performed at [facility], [address] [place], [place] [address]    Report Status PENDING  Incomplete  Blood culture (routine x 2)     Status: None (Preliminary result)   Collection Time: 04/27/23  4:42 PM   Specimen: BLOOD  Result Value Ref Range Status   Specimen Description   Final    BLOOD RIGHT ANTECUBITAL Performed at [facility], [address] [place], [place] [address]    Special  Requests   Final    BOTTLES DRAWN AEROBIC AND ANAEROBIC Blood Culture adequate volume Performed at [facility], [address] [place], [place] [address]    Culture   Final    NO GROWTH 3 DAYS Performed at [facility], [address], [place], [place] [address]    Report Status PENDING  Incomplete  MRSA Next Gen by PCR, Nasal     Status: Abnormal   Collection Time: 04/28/23  9:06 AM   Specimen: Nasal Mucosa; Nasal Swab  Result Value Ref Range Status   MRSA by PCR Next Gen DETECTED (A) NOT DETECTED Final    Comment: RESULT CALLED TO, READ BACK BY AND VERIFIED WITH: FOUNDER, W.  RN AT 1202 ON 1203 ON 04/28/2023 BY MECIAL J. (NOTE) The GeneXpert MRSA Assay (FDA approved for NASAL specimens only), is one component of a comprehensive MRSA colonization surveillance program. It is not  intended to diagnose MRSA infection nor to guide or monitor treatment for MRSA infections. Test performance is not FDA approved in patients less than 72 years old. Performed at [facility], [address] [place], [place] [address]          Radiology Studies: No results found.      Scheduled Meds:  apixaban  5 mg Oral BID   busPIRone  15 mg Oral TID   Chlorhexidine Gluconate Cloth  6 each Topical Q0600   DULoxetine  90 mg Oral Daily   ferrous sulfate  325 mg Oral BID WC   furosemide  80 mg Intravenous BID   insulin aspart  0-9 Units Subcutaneous Q4H   levothyroxine  125 mcg Oral Q0600   lidocaine  1 patch Transdermal Q24H   melatonin  5 mg Oral QHS   montelukast  10 mg Oral QHS   mupirocin ointment  1 Application Nasal BID   rosuvastatin  5 mg Oral Daily   sodium chloride flush  3 mL Intravenous Q12H   Continuous Infusions:  cefTRIAXone (ROCEPHIN)  IV 2 g (04/30/23 1133)          Glade Lloyd, MD [facility] 05/01/2023, 8:16 AM  '

## 2023-05-01 NOTE — Progress Notes (Signed)
Physical Therapy Treatment Patient Details Name: Hannah [name] MRN: [id number] DOB: September 01, 1945 Today's Date: 05/01/2023   History of Present Illness 77 yo female presented to ED on 04/27/2023 due to AMS with hallucinations. Pt was found to have B LE cellulitis and metabolic encephalopathy. Pt UA ahd head CT negative for acute findings, EKG reveals A-fib and incomplete R BBB. Pt PMH includes but is not limited to: A-fib, DM II, glaucoma, HTN, thyroid dz and multiple cardioversion.    PT Comments  Pt seen for PT tx with pt agreeable. Pt demonstrates some improvement with functional mobility as she's able to transition supine>sit with mod assist with reliance on hospital bed features. Pt completes multiple STS from EOB with max assist +2, does attempt to scoot/side step to R along EOB but pt limited by fear of falling. Will continue to follow pt acutely to address strengthening, balance, endurance.    If plan is discharge home, recommend the following: Two people to help with walking and/or transfers;A lot of help with bathing/dressing/bathroom;Assistance with cooking/housework;Direct supervision/assist for medications management;Assist for transportation;Help with stairs or ramp for entrance   Can travel by private vehicle     No  Equipment Recommendations  Other (comment) (defer to next venue)    Recommendations for Other Services       Precautions / Restrictions Precautions Precautions: Fall Precaution Comments: leg pain, weepy Restrictions Weight Bearing Restrictions: No     Mobility  Bed Mobility Overal bed mobility: Needs Assistance Bed Mobility: Supine to Sit     Supine to sit: Mod assist, HOB elevated, Used rails (assistance to move BLE to EOB, cuing for use of bed rails, sequencing, extra time to upright trunk) Sit to supine: Max assist, +2 for physical assistance, Used rails, HOB elevated   General bed mobility comments: +2 to scoot to [facility] with bed in trendelenburg  position    Transfers Overall transfer level: Needs assistance Equipment used: Rolling walker (2 wheels) Transfers: Sit to/from Stand Sit to Stand: Max assist, +2 physical assistance, From elevated surface           General transfer comment: STS from elevated EOB, pt requires 2 attempts for successful first STS. PT provides cuing to activate hip extensors/glutes for more upright posture. Educated pt on hand placement during STS but pt prefers to have BUE on RW as she's used to using rollator prior to admission.    Ambulation/Gait                   Stairs             Wheelchair Mobility     Tilt Bed    Modified Rankin (Stroke Patients Only)       Balance Overall balance assessment: Needs assistance, History of Falls Sitting-balance support: Feet supported, Bilateral upper extremity supported Sitting balance-Leahy Scale: Fair     Standing balance support: During functional activity, Bilateral upper extremity supported, Reliant on assistive device for balance Standing balance-Leahy Scale: Poor                              Cognition Arousal: Alert Behavior During Therapy: Anxious Overall Cognitive Status: Within Functional Limits for tasks assessed                         Following Commands: Follows one step commands consistently, Follows one step commands with increased time Safety/Judgement: Decreased awareness of safety,  Decreased awareness of deficits     General Comments: Pt voices fear of falling, notes she fell 4 times prior to admission.        Exercises General Exercises - Lower Extremity Long Arc Quad: AROM, Seated, Strengthening, Both, 10 reps    General Comments General comments (skin integrity, edema, etc.): Pt c/o slight dizziness sitting EOB.      Pertinent Vitals/Pain Pain Assessment Pain Assessment: 0-10 Pain Score: 10-Worst pain ever Pain Location: RLE up to thigh Pain Descriptors / Indicators:  Discomfort, Grimacing, Guarding Pain Intervention(s): Monitored during session, Patient requesting pain meds-RN notified, Limited activity within patient's tolerance, Repositioned    Home Living                          Prior Function            PT Goals (current goals can now be found in the care plan section) Acute Rehab PT Goals PT Goal Formulation: With patient Progress towards PT goals: Progressing toward goals    Frequency    Min 1X/week      PT Plan      Co-evaluation              AM-PAC PT "6 Clicks" Mobility   Outcome Measure  Help needed turning from your back to your side while in a flat bed without using bedrails?: A Lot Help needed moving from lying on your back to sitting on the side of a flat bed without using bedrails?: Total Help needed moving to and from a bed to a chair (including a wheelchair)?: Total Help needed standing up from a chair using your arms (e.g., wheelchair or bedside chair)?: Total Help needed to walk in hospital room?: Total Help needed climbing 3-5 steps with a railing? : Total 6 Click Score: 7    End of Session   Activity Tolerance: Patient tolerated treatment well (limited by fear of falling) Patient left: in bed;with nursing/sitter in room   PT Visit Diagnosis: Unsteadiness on feet (R26.81);Other abnormalities of gait and mobility (R26.89);Muscle weakness (generalized) (M62.81);History of falling (Z91.81);Difficulty in walking, not elsewhere classified (R26.2);Pain Pain - Right/Left: Right Pain - part of body: Hip;Leg;Ankle and joints of foot     Time: 1425-1452 PT Time Calculation (min) (ACUTE ONLY): 27 min  Charges:    $Therapeutic Activity: 23-37 mins PT General Charges $$ ACUTE PT VISIT: 1 Visit                     Aleda Grana, PT, DPT 05/01/23, 2:59 PM   Sandi Mariscal 05/01/2023, 2:58 PM

## 2023-05-01 NOTE — Progress Notes (Signed)
Progress Note  Patient Name: Hannah [name] Date of Encounter: 05/01/2023  [facility] Cardiologist: Will Jorja Loa, MD    Subjective   Denies any CP or SOB. Legs still with erythema and edema   Good diuresis overnight putting out 2L and net neg 3.7L since admit Inpatient Medications    Scheduled Meds:  apixaban  5 mg Oral BID   busPIRone  15 mg Oral TID   Chlorhexidine Gluconate Cloth  6 each Topical Q0600   DULoxetine  90 mg Oral Daily   ferrous sulfate  325 mg Oral BID WC   furosemide  80 mg Intravenous BID   insulin aspart  0-9 Units Subcutaneous Q4H   levothyroxine  125 mcg Oral Q0600   lidocaine  1 patch Transdermal Q24H   melatonin  5 mg Oral QHS   montelukast  10 mg Oral QHS   mupirocin ointment  1 Application Nasal BID   rosuvastatin  5 mg Oral Daily   sodium chloride flush  3 mL Intravenous Q12H   Continuous Infusions:  cefTRIAXone (ROCEPHIN)  IV 2 g (04/30/23 1133)   PRN Meds: acetaminophen, ondansetron (ZOFRAN) IV, sodium chloride flush, traMADol   Vital Signs    Vitals:   04/30/23 1631 04/30/23 2038 05/01/23 0137 05/01/23 0414  BP: 120/72 120/78  115/69  Pulse: 69 86  74  Resp: 16 18  19   Temp: 97.9 F (36.6 C) 97.7 F (36.5 C)  97.9 F (36.6 C)  TempSrc: Oral Oral  Oral  SpO2: 100% 100%  97%  Weight:   129.5 kg   Height:        Intake/Output Summary (Last 24 hours) at 05/01/2023 0726 Last data filed at 05/01/2023 0530 Gross per 24 hour  Intake 240 ml  Output 2000 ml  Net -1760 ml      05/01/2023    1:37 AM 04/30/2023    5:00 AM 04/28/2023    7:33 AM  Last 3 Weights  Weight (lbs) 285 lb 7.9 oz 299 lb 2.6 oz 307 lb 8.7 oz  Weight (kg) 129.5 kg 135.7 kg 139.5 kg      Telemetry    Afib with CVR  Personally Reviewed  ECG    No new EKG to review- Personally Reviewed  Physical Exam   GEN: Well nourished, well developed in no acute distress HEENT: Normal NECK: No JVD; No carotid bruits LYMPHATICS: No  lymphadenopathy CARDIAC:irregularly irregular, no murmurs, rubs, gallops RESPIRATORY:  Clear to auscultation without rales, wheezing or rhonchi  ABDOMEN: Soft, non-tender, non-distended MUSCULOSKELETAL: 2+ LE edema; No deformity  SKIN: Warm and dry NEUROLOGIC:  Alert and oriented x 3 PSYCHIATRIC:  Normal affect   Labs    High Sensitivity Troponin:  No results for input(s): "TROPONINIHS" in the last 720 hours.    Chemistry Recent Labs  Lab 04/27/23 1519 04/28/23 0356 04/29/23 0404 04/30/23 0407 05/01/23 0346  NA 143   < > 141 140 139  K 4.4   < > 4.1 3.6 3.7  CL 113*   < > 110 109 106  CO2 20*   < > 21* 20* 24  GLUCOSE 142*   < > 128* 117* 109*  BUN 36*   < > 43* 41* 39*  CREATININE 1.31*   < > 1.39* 1.19* 1.07*  CALCIUM 9.1   < > 8.6* 8.1* 8.1*  PROT 8.0  --   --   --   --   ALBUMIN 3.6  --   --   --   --  AST 21  --   --   --   --   ALT 20  --   --   --   --   ALKPHOS 62  --   --   --   --   BILITOT 1.8*  --   --   --   --   GFRNONAA 42*   < > 39* 47* 54*  ANIONGAP 10   < > 10 11 9    < > = values in this interval not displayed.     Hematology Recent Labs  Lab 04/27/23 1519 04/29/23 0404  WBC 10.9* 7.3  RBC 3.57* 3.44*  HGB 10.0* 9.5*  HCT 34.3* 33.2*  MCV 96.1 96.5  MCH 28.0 27.6  MCHC 29.2* 28.6*  RDW 15.9* 16.1*  PLT 199 194    BNP Recent Labs  Lab 04/27/23 1519  BNP 314.2*     DDimer No results for input(s): "DDIMER" in the last 168 hours.   CHA2DS2-VASc Score = 7   This indicates a 11.2% annual risk of stroke. The patient's score is based upon: CHF History: 1 HTN History: 1 Diabetes History: 1 Stroke History: 0 Vascular Disease History: 1 (coronary artery calcifications noted on prior CTA) Age Score: 2 Gender Score: 1      Radiology    No results found.  Patient Profile     77 y.o. female with a history of chronic diastolic CHF, paroxysmal atrial fibrillation on Tikosyn and Eliquis, moderate mitral regurgitation,  hypertension, hyperlipidemia, type 2 diabetes mellitus, chronic venous insufficiency, hypothyroidism, CKD stage III, obstructive sleep apnea on CPAP, and obesity  who was admitted on 04/27/2023 for bilateral leg cellulitis and delirium with hallucination after presenting with a fall and lower extremity edema. Cardiology was consulted for further evaluation of acute on chronic CHF at the request of Dr. Caleb Popp.   Assessment & Plan    Acute on Chronic Diastolic CHF Chronic Venous Insufficiency Patient has a history of chronic diastolic CHF and chronic venous insufficiency. She presented with altered mental status and falls but was also noted to have significant lower extremity edema and diagnosed with cellulitis.  Patient had not filled any of her medications at the pharmacy for the current year including torsemide and spironolactone -BNP mildly elevated at 314.  -Echo showed LVEF of 55-60% with mild LVH, normal RV function, moderate mitral regurgitation, moderate to severe tricuspid regurgitation, and moderate PASP.  -Started on Lasix 40 mg IV twice daily but increased to 80 mg IV twice daily to promote better diuresis -She put out 2L yesterday and is net neg 3.7L since admit -weight down 22lbs from admit -Still volume overloaded on exam with 2-3+ BLE -Serum creatinine stable and improved from 1.39>> 1.19 >> 1.07 today; K+3.7 and Mag 2.1  -continue Lasix 80mg  IV BID and spiro 12.5mg  daily -Agree with holding home Jardiance. She was found sitting in a recliner covered in urine so worry but increased risk of UTI with this. -Continue to monitor daily weights, strict I/Os, and renal function. -replete potassium to keep >4 and mag >2   Persistent  Atrial Fibrillation Patient has a long history of atrial fibrillation. S/p ablation in 2021. She subsequently had return of atrial fibrillation and was started on Tikosyn in 01/2021. She has been lost to follow-up for the past 2 years. -Presented in rate  controlled atrial fibrillation.  -Called Pharmacy and she has not gotten her Tikosyn or Eliquis filled in the year 2024. -Remains in A-fib on  telemetry with rate controlled -continue Eliquis 5mg  BID -has not required any rate controlling meds -Would not plan to restart Tikosyn at this time given medication non-compliance. She has not been seen in our office in over 2 years.  -she admits she has not taken her Eliquis in months   Moderate Mitral Regurgitation Moderate to Severe Tricuspid Regurgitation -Noted on Echo this admission. -Can continue to monitor as an outpatient.   Hypertension -BP well controlled on exam today at 115/50mmHg -Continue diuresis as above.   Hyperlipidemia -Lipids today showed LDL 35, HDL 29 and triglycerides 52  -continue Crestor 5mg  daily  Chronic RBBB -no change from EKG 2022  LE Cellulitis -her legs are still red and warm with significant edema -she admits to running out of her eliquis months ago and not getting refilled -she is very sedentary at home and increased risk of DVTs -I am going to check LE venous dopplers to rule out DVT -antbx per TRH   Otherwise, per primary team: - Delirium with hallucinations - Type 2 diabetes mellitus - Obstructive sleep apnea: on CPAP - Morbid obesity    Total time spent with patient today 30 minutes. This includes reviewing records, evaluating the patient and coordinating care. Face-to-face time >50%.  For questions or updates, please contact [facility] Please consult [email or web address] for contact info under        Signed, Armanda Magic, MD  05/01/2023, 7:26 AM

## 2023-05-01 NOTE — Progress Notes (Signed)
Lower extremity venous duplex completed. Please see CV Procedures for preliminary results.  Shona Simpson, RVT 05/01/23 3:27 PM

## 2023-05-01 NOTE — Plan of Care (Signed)
  Problem: Coping: Goal: Ability to adjust to condition or change in health will improve Outcome: Progressing   Problem: Health Behavior/Discharge Planning: Goal: Ability to identify and utilize available resources and services will improve Outcome: Progressing   Problem: Nutritional: Goal: Maintenance of adequate nutrition will improve Outcome: Progressing   Problem: Health Behavior/Discharge Planning: Goal: Ability to manage health-related needs will improve Outcome: Progressing   Problem: Nutrition: Goal: Adequate nutrition will be maintained Outcome: Progressing   Problem: Coping: Goal: Level of anxiety will decrease Outcome: Progressing   Problem: Elimination: Goal: Will not experience complications related to urinary retention Outcome: Progressing

## 2023-05-01 NOTE — Plan of Care (Signed)
CHL Tonsillectomy/Adenoidectomy, Postoperative PEDS care plan entered in error.

## 2023-05-02 DIAGNOSIS — I5033 Acute on chronic diastolic (congestive) heart failure: Secondary | ICD-10-CM | POA: Diagnosis not present

## 2023-05-02 DIAGNOSIS — G9341 Metabolic encephalopathy: Secondary | ICD-10-CM | POA: Diagnosis not present

## 2023-05-02 LAB — CULTURE, BLOOD (ROUTINE X 2)
Culture: NO GROWTH
Culture: NO GROWTH
Special Requests: ADEQUATE
Special Requests: ADEQUATE

## 2023-05-02 LAB — GLUCOSE, CAPILLARY
Glucose-Capillary: 109 mg/dL — ABNORMAL HIGH (ref 70–99)
Glucose-Capillary: 116 mg/dL — ABNORMAL HIGH (ref 70–99)
Glucose-Capillary: 134 mg/dL — ABNORMAL HIGH (ref 70–99)
Glucose-Capillary: 136 mg/dL — ABNORMAL HIGH (ref 70–99)
Glucose-Capillary: 146 mg/dL — ABNORMAL HIGH (ref 70–99)
Glucose-Capillary: 96 mg/dL (ref 70–99)

## 2023-05-02 LAB — BASIC METABOLIC PANEL
Anion gap: 10 (ref 5–15)
BUN: 37 mg/dL — ABNORMAL HIGH (ref 8–23)
CO2: 28 mmol/L (ref 22–32)
Calcium: 8.6 mg/dL — ABNORMAL LOW (ref 8.9–10.3)
Chloride: 102 mmol/L (ref 98–111)
Creatinine, Ser: 1.05 mg/dL — ABNORMAL HIGH (ref 0.44–1.00)
GFR, Estimated: 55 mL/min — ABNORMAL LOW (ref >60)
Glucose, Bld: 113 mg/dL — ABNORMAL HIGH (ref 70–99)
Potassium: 4.1 mmol/L (ref 3.5–5.1)
Sodium: 140 mmol/L (ref 135–145)

## 2023-05-02 LAB — MAGNESIUM: Magnesium: 2 mg/dL (ref 1.7–2.4)

## 2023-05-02 NOTE — Progress Notes (Signed)
Height:       66.0 in Accession #:    [id number]          Weight:       306.0 lb Date of Birth:  08/01/1945           BSA:          2.396 m Patient Age:    77 years            BP:           145/86 mmHg Patient Gender: F                   HR:           82 bpm. Exam Location:  Inpatient  Procedure: 2D Echo, Cardiac Doppler, Color Doppler and Intracardiac Opacification Agent  Indications:    CHF-Acute Diastolic I50.31  History:        Patient has prior history of Echocardiogram examinations, most recent 01/18/2018. CHF, Arrythmias:Atrial Fibrillation; Risk Factors:Diabetes.  Sonographer:    Lucendia Herrlich RCS Referring Phys: [phone] JARED M GARDNER  IMPRESSIONS   1. Left ventricular ejection fraction, by estimation, is 55 to 60%. The left ventricle has normal function. Left ventricular endocardial border not optimally defined to evaluate regional wall motion. There is mild concentric left ventricular hypertrophy. Left ventricular diastolic parameters are indeterminate. There was not sufficient IV access for Definity contrast imaging. 2. Right ventricular systolic function is normal. The right ventricular size is not well visualized. There is moderately elevated pulmonary artery systolic pressure. The estimated right ventricular systolic pressure is 54.9  mmHg. 3. Left atrial size was moderately dilated. 4. The mitral valve is grossly normal. Moderate mitral valve regurgitation. No evidence of mitral stenosis. 5. Tricuspid valve regurgitation is moderate to severe. 6. The aortic valve is tricuspid. Aortic valve regurgitation is not visualized. Aortic valve sclerosis is present, with no evidence of aortic valve stenosis. 7. The inferior vena cava is dilated in size with <50% respiratory variability, suggesting right atrial pressure of 15 mmHg.  Comparison(s): Prior images reviewed side by side. MR has improved from prior study, TR may be worse; difficult comparison to 2021 TEE.  FINDINGS Left Ventricle: Left ventricular ejection fraction, by estimation, is 55 to 60%. The left ventricle has normal function. Left ventricular endocardial border not optimally defined to evaluate regional wall motion. Definity contrast agent was given IV to delineate the left ventricular endocardial borders. The left ventricular internal cavity size was normal in size. There is mild concentric left ventricular hypertrophy. Left ventricular diastolic parameters are indeterminate.  Right Ventricle: The right ventricular size is not well visualized. No increase in right ventricular wall thickness. Right ventricular systolic function is normal. There is moderately elevated pulmonary artery systolic pressure. The tricuspid regurgitant velocity is 3.16 m/s, and with an assumed right atrial pressure of 15 mmHg, the estimated right ventricular systolic pressure is 54.9 mmHg.  Left Atrium: Left atrial size was moderately dilated.  Right Atrium: Right atrial size was normal in size.  Pericardium: There is no evidence of pericardial effusion.  Mitral Valve: The mitral valve is grossly normal. Moderate mitral valve regurgitation. No evidence of mitral valve stenosis.  Tricuspid Valve: The tricuspid valve is normal in structure. Tricuspid valve regurgitation is moderate to  severe.  Aortic Valve: The aortic valve is tricuspid. Aortic valve regurgitation is not visualized. Aortic valve sclerosis is present, with no evidence of aortic valve stenosis. Aortic valve mean gradient measures 7.5 mmHg. Aortic valve  of Exam: 05/03/2020 Medical Rec #:  [id number]           Height:       66.0 in Accession #:    [id number]          Weight:       268.0 lb Date of Birth:  07/25/1945           BSA:          2.265 m Patient Age:    74 years            BP:           117/54 mmHg Patient Gender: F                   HR:           71 bpm. Exam Location:  Inpatient  Procedure: Transesophageal Echo  Indications:    atrial fibrillation  History:        Patient has prior history of Echocardiogram examinations, most recent 11/27/2019. Arrythmias:Atrial Fibrillation; Risk Factors:Diabetes and Hypertension.  Sonographer:    Celene Skeen RDCS (AE) Referring Phys: [phone] [place]  PROCEDURE: The transesophogeal probe was passed without difficulty through the esophogus of the patient. Sedation performed by different physician. The patient developed no complications during the procedure.  IMPRESSIONS   1. Left ventricular ejection fraction, by estimation, is 60 to 65%. The left ventricle has normal function. 2. Right ventricular systolic function is normal. The right ventricular size is mildly enlarged. There is moderately elevated pulmonary artery systolic pressure. 3. Left atrial size was moderately dilated. No left atrial/left atrial appendage thrombus was detected. 4. Right atrial size was mildly dilated. 5. The mitral valve is abnormal. Moderate mitral valve regurgitation. ERO 0.2 cm^2, RV 42  cc. 6. Tricuspid valve regurgitation is moderate. 7. The aortic valve is tricuspid. Aortic valve regurgitation is not visualized. No aortic stenosis is present.  FINDINGS Left Ventricle: Left ventricular ejection fraction, by estimation, is 60 to 65%. The left ventricle has normal function. The left ventricular internal cavity size was normal in size.  Right Ventricle: The right ventricular size is mildly enlarged. Right vetricular wall thickness was not assessed. Right ventricular systolic function is normal. There is moderately elevated pulmonary artery systolic pressure. The tricuspid regurgitant velocity is 3.15 m/s, and with an assumed right atrial pressure of 8 mmHg, the estimated right ventricular systolic pressure is 47.7 mmHg.  Left Atrium: Left atrial size was moderately dilated. No left atrial/left atrial appendage thrombus was detected.  Right Atrium: Right atrial size was mildly dilated.  Pericardium: There is no evidence of pericardial effusion.  Mitral Valve: The mitral valve is abnormal. Moderate mitral valve regurgitation.  Tricuspid Valve: The tricuspid valve is normal in structure. Tricuspid valve regurgitation is moderate.  Aortic Valve: The aortic valve is tricuspid. Aortic valve regurgitation is not visualized. No aortic stenosis is present.  Pulmonic Valve: The pulmonic valve was not well visualized. Pulmonic valve regurgitation is not visualized.  Aorta: The aortic root and ascending aorta are structurally normal, with no evidence of dilitation.  IAS/Shunts: No atrial level shunt detected by color flow Doppler.   MR Peak grad:    145.4 mmHg  TRICUSPID VALVE MR Mean grad:    96.0 mmHg   TR Peak grad:   39.7 mmHg MR Vmax:         603.00 cm/s TR Vmax:        315.00 cm/s MR Vmean:  of Exam: 05/03/2020 Medical Rec #:  [id number]           Height:       66.0 in Accession #:    [id number]          Weight:       268.0 lb Date of Birth:  07/25/1945           BSA:          2.265 m Patient Age:    74 years            BP:           117/54 mmHg Patient Gender: F                   HR:           71 bpm. Exam Location:  Inpatient  Procedure: Transesophageal Echo  Indications:    atrial fibrillation  History:        Patient has prior history of Echocardiogram examinations, most recent 11/27/2019. Arrythmias:Atrial Fibrillation; Risk Factors:Diabetes and Hypertension.  Sonographer:    Celene Skeen RDCS (AE) Referring Phys: [phone] [place]ikawa  PROCEDURE: The transesophogeal probe was passed without difficulty through the esophogus of the patient. Sedation performed by different physician. The patient developed no complications during the procedure.  IMPRESSIONS   1. Left ventricular ejection fraction, by estimation, is 60 to 65%. The left ventricle has normal function. 2. Right ventricular systolic function is normal. The right ventricular size is mildly enlarged. There is moderately elevated pulmonary artery systolic pressure. 3. Left atrial size was moderately dilated. No left atrial/left atrial appendage thrombus was detected. 4. Right atrial size was mildly dilated. 5. The mitral valve is abnormal. Moderate mitral valve regurgitation. ERO 0.2 cm^2, RV 42  cc. 6. Tricuspid valve regurgitation is moderate. 7. The aortic valve is tricuspid. Aortic valve regurgitation is not visualized. No aortic stenosis is present.  FINDINGS Left Ventricle: Left ventricular ejection fraction, by estimation, is 60 to 65%. The left ventricle has normal function. The left ventricular internal cavity size was normal in size.  Right Ventricle: The right ventricular size is mildly enlarged. Right vetricular wall thickness was not assessed. Right ventricular systolic function is normal. There is moderately elevated pulmonary artery systolic pressure. The tricuspid regurgitant velocity is 3.15 m/s, and with an assumed right atrial pressure of 8 mmHg, the estimated right ventricular systolic pressure is 47.7 mmHg.  Left Atrium: Left atrial size was moderately dilated. No left atrial/left atrial appendage thrombus was detected.  Right Atrium: Right atrial size was mildly dilated.  Pericardium: There is no evidence of pericardial effusion.  Mitral Valve: The mitral valve is abnormal. Moderate mitral valve regurgitation.  Tricuspid Valve: The tricuspid valve is normal in structure. Tricuspid valve regurgitation is moderate.  Aortic Valve: The aortic valve is tricuspid. Aortic valve regurgitation is not visualized. No aortic stenosis is present.  Pulmonic Valve: The pulmonic valve was not well visualized. Pulmonic valve regurgitation is not visualized.  Aorta: The aortic root and ascending aorta are structurally normal, with no evidence of dilitation.  IAS/Shunts: No atrial level shunt detected by color flow Doppler.   MR Peak grad:    145.4 mmHg  TRICUSPID VALVE MR Mean grad:    96.0 mmHg   TR Peak grad:   39.7 mmHg MR Vmax:         603.00 cm/s TR Vmax:        315.00 cm/s MR Vmean:  of Exam: 05/03/2020 Medical Rec #:  [id number]           Height:       66.0 in Accession #:    [id number]          Weight:       268.0 lb Date of Birth:  07/25/1945           BSA:          2.265 m Patient Age:    74 years            BP:           117/54 mmHg Patient Gender: F                   HR:           71 bpm. Exam Location:  Inpatient  Procedure: Transesophageal Echo  Indications:    atrial fibrillation  History:        Patient has prior history of Echocardiogram examinations, most recent 11/27/2019. Arrythmias:Atrial Fibrillation; Risk Factors:Diabetes and Hypertension.  Sonographer:    Celene Skeen RDCS (AE) Referring Phys: [id number] [place]ikawa  PROCEDURE: The transesophogeal probe was passed without difficulty through the esophogus of the patient. Sedation performed by different physician. The patient developed no complications during the procedure.  IMPRESSIONS   1. Left ventricular ejection fraction, by estimation, is 60 to 65%. The left ventricle has normal function. 2. Right ventricular systolic function is normal. The right ventricular size is mildly enlarged. There is moderately elevated pulmonary artery systolic pressure. 3. Left atrial size was moderately dilated. No left atrial/left atrial appendage thrombus was detected. 4. Right atrial size was mildly dilated. 5. The mitral valve is abnormal. Moderate mitral valve regurgitation. ERO 0.2 cm^2, RV 42  cc. 6. Tricuspid valve regurgitation is moderate. 7. The aortic valve is tricuspid. Aortic valve regurgitation is not visualized. No aortic stenosis is present.  FINDINGS Left Ventricle: Left ventricular ejection fraction, by estimation, is 60 to 65%. The left ventricle has normal function. The left ventricular internal cavity size was normal in size.  Right Ventricle: The right ventricular size is mildly enlarged. Right vetricular wall thickness was not assessed. Right ventricular systolic function is normal. There is moderately elevated pulmonary artery systolic pressure. The tricuspid regurgitant velocity is 3.15 m/s, and with an assumed right atrial pressure of 8 mmHg, the estimated right ventricular systolic pressure is 47.7 mmHg.  Left Atrium: Left atrial size was moderately dilated. No left atrial/left atrial appendage thrombus was detected.  Right Atrium: Right atrial size was mildly dilated.  Pericardium: There is no evidence of pericardial effusion.  Mitral Valve: The mitral valve is abnormal. Moderate mitral valve regurgitation.  Tricuspid Valve: The tricuspid valve is normal in structure. Tricuspid valve regurgitation is moderate.  Aortic Valve: The aortic valve is tricuspid. Aortic valve regurgitation is not visualized. No aortic stenosis is present.  Pulmonic Valve: The pulmonic valve was not well visualized. Pulmonic valve regurgitation is not visualized.  Aorta: The aortic root and ascending aorta are structurally normal, with no evidence of dilitation.  IAS/Shunts: No atrial level shunt detected by color flow Doppler.   MR Peak grad:    145.4 mmHg  TRICUSPID VALVE MR Mean grad:    96.0 mmHg   TR Peak grad:   39.7 mmHg MR Vmax:         603.00 cm/s TR Vmax:        315.00 cm/s MR Vmean:  Height:       66.0 in Accession #:    [id number]          Weight:       306.0 lb Date of Birth:  08/01/1945           BSA:          2.396 m Patient Age:    77 years            BP:           145/86 mmHg Patient Gender: F                   HR:           82 bpm. Exam Location:  Inpatient  Procedure: 2D Echo, Cardiac Doppler, Color Doppler and Intracardiac Opacification Agent  Indications:    CHF-Acute Diastolic I50.31  History:        Patient has prior history of Echocardiogram examinations, most recent 01/18/2018. CHF, Arrythmias:Atrial Fibrillation; Risk Factors:Diabetes.  Sonographer:    Lucendia Herrlich RCS Referring Phys: [phone] JARED M GARDNER  IMPRESSIONS   1. Left ventricular ejection fraction, by estimation, is 55 to 60%. The left ventricle has normal function. Left ventricular endocardial border not optimally defined to evaluate regional wall motion. There is mild concentric left ventricular hypertrophy. Left ventricular diastolic parameters are indeterminate. There was not sufficient IV access for Definity contrast imaging. 2. Right ventricular systolic function is normal. The right ventricular size is not well visualized. There is moderately elevated pulmonary artery systolic pressure. The estimated right ventricular systolic pressure is 54.9  mmHg. 3. Left atrial size was moderately dilated. 4. The mitral valve is grossly normal. Moderate mitral valve regurgitation. No evidence of mitral stenosis. 5. Tricuspid valve regurgitation is moderate to severe. 6. The aortic valve is tricuspid. Aortic valve regurgitation is not visualized. Aortic valve sclerosis is present, with no evidence of aortic valve stenosis. 7. The inferior vena cava is dilated in size with <50% respiratory variability, suggesting right atrial pressure of 15 mmHg.  Comparison(s): Prior images reviewed side by side. MR has improved from prior study, TR may be worse; difficult comparison to 2021 TEE.  FINDINGS Left Ventricle: Left ventricular ejection fraction, by estimation, is 55 to 60%. The left ventricle has normal function. Left ventricular endocardial border not optimally defined to evaluate regional wall motion. Definity contrast agent was given IV to delineate the left ventricular endocardial borders. The left ventricular internal cavity size was normal in size. There is mild concentric left ventricular hypertrophy. Left ventricular diastolic parameters are indeterminate.  Right Ventricle: The right ventricular size is not well visualized. No increase in right ventricular wall thickness. Right ventricular systolic function is normal. There is moderately elevated pulmonary artery systolic pressure. The tricuspid regurgitant velocity is 3.16 m/s, and with an assumed right atrial pressure of 15 mmHg, the estimated right ventricular systolic pressure is 54.9 mmHg.  Left Atrium: Left atrial size was moderately dilated.  Right Atrium: Right atrial size was normal in size.  Pericardium: There is no evidence of pericardial effusion.  Mitral Valve: The mitral valve is grossly normal. Moderate mitral valve regurgitation. No evidence of mitral valve stenosis.  Tricuspid Valve: The tricuspid valve is normal in structure. Tricuspid valve regurgitation is moderate to  severe.  Aortic Valve: The aortic valve is tricuspid. Aortic valve regurgitation is not visualized. Aortic valve sclerosis is present, with no evidence of aortic valve stenosis. Aortic valve mean gradient measures 7.5 mmHg. Aortic valve

## 2023-05-02 NOTE — Plan of Care (Signed)
  Problem: Coping: Goal: Ability to adjust to condition or change in health will improve Outcome: Progressing   Problem: Health Behavior/Discharge Planning: Goal: Ability to identify and utilize available resources and services will improve Outcome: Progressing Goal: Ability to manage health-related needs will improve Outcome: Progressing   Problem: Metabolic: Goal: Ability to maintain appropriate glucose levels will improve Outcome: Progressing   Problem: Nutritional: Goal: Maintenance of adequate nutrition will improve Outcome: Progressing   Problem: Health Behavior/Discharge Planning: Goal: Ability to manage health-related needs will improve Outcome: Progressing   Problem: Nutrition: Goal: Adequate nutrition will be maintained Outcome: Progressing   Problem: Coping: Goal: Level of anxiety will decrease Outcome: Progressing   Problem: Elimination: Goal: Will not experience complications related to urinary retention Outcome: Progressing

## 2023-05-02 NOTE — Progress Notes (Signed)
Special Requests   Final    BOTTLES DRAWN AEROBIC AND ANAEROBIC Blood Culture adequate volume Performed at [facility], [address] [place], [place] [address]    Culture   Final    NO GROWTH 5 DAYS Performed at [facility], [address], [place], [place] [address]    Report Status 05/02/2023 FINAL  Final  MRSA Next Gen by PCR, Nasal     Status: Abnormal   Collection Time: 04/28/23  9:06 AM   Specimen: Nasal Mucosa; Nasal Swab  Result Value Ref Range Status   MRSA by PCR Next Gen DETECTED (A) NOT DETECTED Final    Comment: RESULT CALLED TO, READ BACK BY AND VERIFIED WITH: FOUNDER, W.  RN AT 1202 ON 1203 ON 04/28/2023 BY MECIAL J. (NOTE) The GeneXpert MRSA Assay (FDA approved for NASAL specimens only), is one component of a comprehensive MRSA colonization surveillance program. It is not  intended to diagnose MRSA infection nor to guide or monitor treatment for MRSA infections. Test performance is not FDA approved in patients less than 81 years old. Performed at [facility], [address], [place], [place] [address]          Radiology Studies: VAS [place] LOWER EXTREMITY VENOUS (DVT)  Result Date: 05/01/2023  Lower Venous DVT Study Patient Name:  Hannah [name]  Date of Exam:   05/01/2023 Medical Rec #: [id number]            Accession #:    [id number] Date of Birth: 09/23/1945            Patient Gender: F Patient Age:   89 years Exam Location:  [facility] Procedure:      VAS [place] LOWER EXTREMITY VENOUS (DVT) Referring Phys: Gloris Manchester TURNER --------------------------------------------------------------------------------  Indications: Swelling, and Edema.  Risk Factors: Obesity. Limitations: Body habitus. Comparison Study: No significant changes seen since previous exam 09/14/16 Performing Technologist: Shona Simpson  Examination Guidelines: A complete evaluation includes B-mode imaging, spectral Doppler, color Doppler, and power Doppler as needed of all accessible portions of each vessel. Bilateral testing is considered an integral part of a complete examination. Limited examinations for reoccurring indications may be performed as noted. The reflux portion of the exam is performed with the patient in reverse Trendelenburg.  +---------+---------------+---------+-----------+----------+---------------+ RIGHT    CompressibilityPhasicitySpontaneityPropertiesThrombus Aging  +---------+---------------+---------+-----------+----------+---------------+ CFV      Full                                                         +---------+---------------+---------+-----------+----------+---------------+ SFJ      Full                                                         +---------+---------------+---------+-----------+----------+---------------+ FV Prox   Full                                                         +---------+---------------+---------+-----------+----------+---------------+ FV Mid   Full                                                         +---------+---------------+---------+-----------+----------+---------------+  PROGRESS NOTE    Hannah [name]  ZHY:[id number] DOB: 22-May-1946 DOA: 04/27/2023 PCP: Darrow Bussing, MD   Brief Narrative:  77 y.o. female with a history of atrial fibrillation, diabetes mellitus type 2, hypertension, diastolic heart failure, morbid obesity presented with a fall and increased leg swelling and was found to have evidence of bilateral leg cellulitis complicated by chronic leg wound.  Started on IV Rocephin.  Cardiology was consulted.  She is currently on IV Lasix.  PT recommended SNF placement.  Assessment & Plan:   Bilateral lower extremity cellulitis -With associated right anterior leg wound.  Wound care as per wound care RN recommendations -CT of both legs confirm cellulitis without evidence of underlying abscesses.  Continue Rocephin: Cellulitis improving   Acute on chronic diastolic heart failure -Likely secondary to medication nonadherence.  Strict input and output.  Daily weights.  Fluid restriction.  Negative balance of 6255 cc since admission -Cardiology following: On IV Lasix 80 mg twice a day currently.  Also on spironolactone.  Echo showed EF of 55 to 60% with moderate mitral valve regurgitation and moderate to severe tricuspid valve regurgitation  Acute metabolic encephalopathy/delirium -Likely related to above.  CT head unremarkable for acute process.  Symptoms appear to have resolved.  Persistent A-fib -Apparently patient missed several doses of Tikosyn as an outpatient.  Cardiology following: Recommending not to restart Tikosyn.  Continue Eliquis.  Currently rate controlled  CKD stage IIIb -Creatinine currently stable.  Monitor  Acute metabolic acidosis -Improved.  Monitor.  Anemia of chronic disease -From renal failure.  Hemoglobin currently stable.  Monitor.  Diabetes mellitus type 2 -A1c 6.2.  She is on Jardiance as an outpatient.  Carb modified diet.  Hypothyroidism -Continue levothyroxine  Hyperlipidemia -Continue statin  Morbid  obesity -Outpatient follow-up  Anxiety/depression -Continue buspirone and duloxetine  Physical deconditioning -PT recommends SNF placement.  TOC consulted.   DVT prophylaxis: Eliquis Code Status: Full Family Communication: None at bedside Disposition Plan: Status is: Inpatient Remains inpatient appropriate because: Of severity of illness.  Possible discharge in 1 to 2 days if remains stable and cleared by cardiology    Consultants: Cardiology  Procedures: 2D echo  Antimicrobials: Rocephin from 04/27/2023 onwards   Subjective: Patient seen and examined at bedside.  Poor historian.  Denies worsening shortness of breath, chest pain or vomiting.   Objective: Vitals:   05/01/23 1313 05/01/23 1954 05/02/23 0421 05/02/23 0432  BP: 129/74 127/77 109/67   Pulse: 77 77 85   Resp: 18 16 20    Temp: 98.3 F (36.8 C) 97.9 F (36.6 C) 98.6 F (37 C)   TempSrc: Oral Oral Oral   SpO2: 98% 99% 96%   Weight:    128.7 kg  Height:        Intake/Output Summary (Last 24 hours) at 05/02/2023 0834 Last data filed at 05/02/2023 0434 Gross per 24 hour  Intake 720 ml  Output 3250 ml  Net -2530 ml   Filed Weights   04/30/23 0500 05/01/23 0137 05/02/23 0432  Weight: 135.7 kg 129.5 kg 128.7 kg    Examination:  General: On room air currently.  No distress.  Looks chronically ill and deconditioned. ENT/neck: No JVD elevation or obvious thyromegaly noted  respiratory: Decreased breath sounds at bases bilaterally with scattered crackles  CVS: S1-S2 heard; rate mostly controlled  abdominal: Soft, morbidly obese, nontender, remains slightly distended; no organomegaly, normal bowel sounds.   Extremities: Extremity edema present bilaterally; no cyanosis  CNS: Awake; still extremity slow to respond.  PROGRESS NOTE    Hannah [name]  ZHY:[id number] DOB: 22-May-1946 DOA: 04/27/2023 PCP: Darrow Bussing, MD   Brief Narrative:  77 y.o. female with a history of atrial fibrillation, diabetes mellitus type 2, hypertension, diastolic heart failure, morbid obesity presented with a fall and increased leg swelling and was found to have evidence of bilateral leg cellulitis complicated by chronic leg wound.  Started on IV Rocephin.  Cardiology was consulted.  She is currently on IV Lasix.  PT recommended SNF placement.  Assessment & Plan:   Bilateral lower extremity cellulitis -With associated right anterior leg wound.  Wound care as per wound care RN recommendations -CT of both legs confirm cellulitis without evidence of underlying abscesses.  Continue Rocephin: Cellulitis improving   Acute on chronic diastolic heart failure -Likely secondary to medication nonadherence.  Strict input and output.  Daily weights.  Fluid restriction.  Negative balance of 6255 cc since admission -Cardiology following: On IV Lasix 80 mg twice a day currently.  Also on spironolactone.  Echo showed EF of 55 to 60% with moderate mitral valve regurgitation and moderate to severe tricuspid valve regurgitation  Acute metabolic encephalopathy/delirium -Likely related to above.  CT head unremarkable for acute process.  Symptoms appear to have resolved.  Persistent A-fib -Apparently patient missed several doses of Tikosyn as an outpatient.  Cardiology following: Recommending not to restart Tikosyn.  Continue Eliquis.  Currently rate controlled  CKD stage IIIb -Creatinine currently stable.  Monitor  Acute metabolic acidosis -Improved.  Monitor.  Anemia of chronic disease -From renal failure.  Hemoglobin currently stable.  Monitor.  Diabetes mellitus type 2 -A1c 6.2.  She is on Jardiance as an outpatient.  Carb modified diet.  Hypothyroidism -Continue levothyroxine  Hyperlipidemia -Continue statin  Morbid  obesity -Outpatient follow-up  Anxiety/depression -Continue buspirone and duloxetine  Physical deconditioning -PT recommends SNF placement.  TOC consulted.   DVT prophylaxis: Eliquis Code Status: Full Family Communication: None at bedside Disposition Plan: Status is: Inpatient Remains inpatient appropriate because: Of severity of illness.  Possible discharge in 1 to 2 days if remains stable and cleared by cardiology    Consultants: Cardiology  Procedures: 2D echo  Antimicrobials: Rocephin from 04/27/2023 onwards   Subjective: Patient seen and examined at bedside.  Poor historian.  Denies worsening shortness of breath, chest pain or vomiting.   Objective: Vitals:   05/01/23 1313 05/01/23 1954 05/02/23 0421 05/02/23 0432  BP: 129/74 127/77 109/67   Pulse: 77 77 85   Resp: 18 16 20    Temp: 98.3 F (36.8 C) 97.9 F (36.6 C) 98.6 F (37 C)   TempSrc: Oral Oral Oral   SpO2: 98% 99% 96%   Weight:    128.7 kg  Height:        Intake/Output Summary (Last 24 hours) at 05/02/2023 0834 Last data filed at 05/02/2023 0434 Gross per 24 hour  Intake 720 ml  Output 3250 ml  Net -2530 ml   Filed Weights   04/30/23 0500 05/01/23 0137 05/02/23 0432  Weight: 135.7 kg 129.5 kg 128.7 kg    Examination:  General: On room air currently.  No distress.  Looks chronically ill and deconditioned. ENT/neck: No JVD elevation or obvious thyromegaly noted  respiratory: Decreased breath sounds at bases bilaterally with scattered crackles  CVS: S1-S2 heard; rate mostly controlled  abdominal: Soft, morbidly obese, nontender, remains slightly distended; no organomegaly, normal bowel sounds.   Extremities: Extremity edema present bilaterally; no cyanosis  CNS: Awake; still extremity slow to respond.  PROGRESS NOTE    Hannah [name]  ZHY:[id number] DOB: 22-May-1946 DOA: 04/27/2023 PCP: Darrow Bussing, MD   Brief Narrative:  77 y.o. female with a history of atrial fibrillation, diabetes mellitus type 2, hypertension, diastolic heart failure, morbid obesity presented with a fall and increased leg swelling and was found to have evidence of bilateral leg cellulitis complicated by chronic leg wound.  Started on IV Rocephin.  Cardiology was consulted.  She is currently on IV Lasix.  PT recommended SNF placement.  Assessment & Plan:   Bilateral lower extremity cellulitis -With associated right anterior leg wound.  Wound care as per wound care RN recommendations -CT of both legs confirm cellulitis without evidence of underlying abscesses.  Continue Rocephin: Cellulitis improving   Acute on chronic diastolic heart failure -Likely secondary to medication nonadherence.  Strict input and output.  Daily weights.  Fluid restriction.  Negative balance of 6255 cc since admission -Cardiology following: On IV Lasix 80 mg twice a day currently.  Also on spironolactone.  Echo showed EF of 55 to 60% with moderate mitral valve regurgitation and moderate to severe tricuspid valve regurgitation  Acute metabolic encephalopathy/delirium -Likely related to above.  CT head unremarkable for acute process.  Symptoms appear to have resolved.  Persistent A-fib -Apparently patient missed several doses of Tikosyn as an outpatient.  Cardiology following: Recommending not to restart Tikosyn.  Continue Eliquis.  Currently rate controlled  CKD stage IIIb -Creatinine currently stable.  Monitor  Acute metabolic acidosis -Improved.  Monitor.  Anemia of chronic disease -From renal failure.  Hemoglobin currently stable.  Monitor.  Diabetes mellitus type 2 -A1c 6.2.  She is on Jardiance as an outpatient.  Carb modified diet.  Hypothyroidism -Continue levothyroxine  Hyperlipidemia -Continue statin  Morbid  obesity -Outpatient follow-up  Anxiety/depression -Continue buspirone and duloxetine  Physical deconditioning -PT recommends SNF placement.  TOC consulted.   DVT prophylaxis: Eliquis Code Status: Full Family Communication: None at bedside Disposition Plan: Status is: Inpatient Remains inpatient appropriate because: Of severity of illness.  Possible discharge in 1 to 2 days if remains stable and cleared by cardiology    Consultants: Cardiology  Procedures: 2D echo  Antimicrobials: Rocephin from 04/27/2023 onwards   Subjective: Patient seen and examined at bedside.  Poor historian.  Denies worsening shortness of breath, chest pain or vomiting.   Objective: Vitals:   05/01/23 1313 05/01/23 1954 05/02/23 0421 05/02/23 0432  BP: 129/74 127/77 109/67   Pulse: 77 77 85   Resp: 18 16 20    Temp: 98.3 F (36.8 C) 97.9 F (36.6 C) 98.6 F (37 C)   TempSrc: Oral Oral Oral   SpO2: 98% 99% 96%   Weight:    128.7 kg  Height:        Intake/Output Summary (Last 24 hours) at 05/02/2023 0834 Last data filed at 05/02/2023 0434 Gross per 24 hour  Intake 720 ml  Output 3250 ml  Net -2530 ml   Filed Weights   04/30/23 0500 05/01/23 0137 05/02/23 0432  Weight: 135.7 kg 129.5 kg 128.7 kg    Examination:  General: On room air currently.  No distress.  Looks chronically ill and deconditioned. ENT/neck: No JVD elevation or obvious thyromegaly noted  respiratory: Decreased breath sounds at bases bilaterally with scattered crackles  CVS: S1-S2 heard; rate mostly controlled  abdominal: Soft, morbidly obese, nontender, remains slightly distended; no organomegaly, normal bowel sounds.   Extremities: Extremity edema present bilaterally; no cyanosis  CNS: Awake; still extremity slow to respond.  PROGRESS NOTE    Hannah [name]  ZHY:[id number] DOB: 22-May-1946 DOA: 04/27/2023 PCP: Darrow Bussing, MD   Brief Narrative:  77 y.o. female with a history of atrial fibrillation, diabetes mellitus type 2, hypertension, diastolic heart failure, morbid obesity presented with a fall and increased leg swelling and was found to have evidence of bilateral leg cellulitis complicated by chronic leg wound.  Started on IV Rocephin.  Cardiology was consulted.  She is currently on IV Lasix.  PT recommended SNF placement.  Assessment & Plan:   Bilateral lower extremity cellulitis -With associated right anterior leg wound.  Wound care as per wound care RN recommendations -CT of both legs confirm cellulitis without evidence of underlying abscesses.  Continue Rocephin: Cellulitis improving   Acute on chronic diastolic heart failure -Likely secondary to medication nonadherence.  Strict input and output.  Daily weights.  Fluid restriction.  Negative balance of 6255 cc since admission -Cardiology following: On IV Lasix 80 mg twice a day currently.  Also on spironolactone.  Echo showed EF of 55 to 60% with moderate mitral valve regurgitation and moderate to severe tricuspid valve regurgitation  Acute metabolic encephalopathy/delirium -Likely related to above.  CT head unremarkable for acute process.  Symptoms appear to have resolved.  Persistent A-fib -Apparently patient missed several doses of Tikosyn as an outpatient.  Cardiology following: Recommending not to restart Tikosyn.  Continue Eliquis.  Currently rate controlled  CKD stage IIIb -Creatinine currently stable.  Monitor  Acute metabolic acidosis -Improved.  Monitor.  Anemia of chronic disease -From renal failure.  Hemoglobin currently stable.  Monitor.  Diabetes mellitus type 2 -A1c 6.2.  She is on Jardiance as an outpatient.  Carb modified diet.  Hypothyroidism -Continue levothyroxine  Hyperlipidemia -Continue statin  Morbid  obesity -Outpatient follow-up  Anxiety/depression -Continue buspirone and duloxetine  Physical deconditioning -PT recommends SNF placement.  TOC consulted.   DVT prophylaxis: Eliquis Code Status: Full Family Communication: None at bedside Disposition Plan: Status is: Inpatient Remains inpatient appropriate because: Of severity of illness.  Possible discharge in 1 to 2 days if remains stable and cleared by cardiology    Consultants: Cardiology  Procedures: 2D echo  Antimicrobials: Rocephin from 04/27/2023 onwards   Subjective: Patient seen and examined at bedside.  Poor historian.  Denies worsening shortness of breath, chest pain or vomiting.   Objective: Vitals:   05/01/23 1313 05/01/23 1954 05/02/23 0421 05/02/23 0432  BP: 129/74 127/77 109/67   Pulse: 77 77 85   Resp: 18 16 20    Temp: 98.3 F (36.8 C) 97.9 F (36.6 C) 98.6 F (37 C)   TempSrc: Oral Oral Oral   SpO2: 98% 99% 96%   Weight:    128.7 kg  Height:        Intake/Output Summary (Last 24 hours) at 05/02/2023 0834 Last data filed at 05/02/2023 0434 Gross per 24 hour  Intake 720 ml  Output 3250 ml  Net -2530 ml   Filed Weights   04/30/23 0500 05/01/23 0137 05/02/23 0432  Weight: 135.7 kg 129.5 kg 128.7 kg    Examination:  General: On room air currently.  No distress.  Looks chronically ill and deconditioned. ENT/neck: No JVD elevation or obvious thyromegaly noted  respiratory: Decreased breath sounds at bases bilaterally with scattered crackles  CVS: S1-S2 heard; rate mostly controlled  abdominal: Soft, morbidly obese, nontender, remains slightly distended; no organomegaly, normal bowel sounds.   Extremities: Extremity edema present bilaterally; no cyanosis  CNS: Awake; still extremity slow to respond.

## 2023-05-03 DIAGNOSIS — G9341 Metabolic encephalopathy: Secondary | ICD-10-CM | POA: Diagnosis not present

## 2023-05-03 LAB — BASIC METABOLIC PANEL
Anion gap: 10 (ref 5–15)
BUN: 38 mg/dL — ABNORMAL HIGH (ref 8–23)
CO2: 30 mmol/L (ref 22–32)
Calcium: 8.7 mg/dL — ABNORMAL LOW (ref 8.9–10.3)
Chloride: 101 mmol/L (ref 98–111)
Creatinine, Ser: 1.07 mg/dL — ABNORMAL HIGH (ref 0.44–1.00)
GFR, Estimated: 54 mL/min — ABNORMAL LOW (ref >60)
Glucose, Bld: 115 mg/dL — ABNORMAL HIGH (ref 70–99)
Potassium: 3.9 mmol/L (ref 3.5–5.1)
Sodium: 141 mmol/L (ref 135–145)

## 2023-05-03 LAB — GLUCOSE, CAPILLARY
Glucose-Capillary: 123 mg/dL — ABNORMAL HIGH (ref 70–99)
Glucose-Capillary: 129 mg/dL — ABNORMAL HIGH (ref 70–99)
Glucose-Capillary: 119 mg/dL — ABNORMAL HIGH (ref 70–99)
Glucose-Capillary: 132 mg/dL — ABNORMAL HIGH (ref 70–99)
Glucose-Capillary: 133 mg/dL — ABNORMAL HIGH (ref 70–99)
Glucose-Capillary: 111 mg/dL — ABNORMAL HIGH (ref 70–99)

## 2023-05-03 LAB — MAGNESIUM: Magnesium: 2 mg/dL (ref 1.7–2.4)

## 2023-05-03 MED ORDER — HYDROCORTISONE 0.5 % EX CREA
TOPICAL_CREAM | CUTANEOUS | Status: DC | PRN
  Administered 2023-05-03: 1 via TOPICAL
  Filled 2023-05-03: qty 28.35

## 2023-05-03 NOTE — Plan of Care (Signed)
Uneventful shift.  Ongoing diuresis with good urine output.  Compliant with 1500 mL fluid restriction.

## 2023-05-03 NOTE — Progress Notes (Signed)
Special Requests   Final    BOTTLES DRAWN AEROBIC AND ANAEROBIC Blood Culture adequate volume Performed at [facility], [address] [place], [place] [address]    Culture   Final    NO GROWTH 5 DAYS Performed at [facility], [address], [place], [place] [address]    Report Status 05/02/2023 FINAL  Final  MRSA Next Gen by PCR, Nasal     Status: Abnormal   Collection Time: 04/28/23  9:06 AM   Specimen: Nasal Mucosa; Nasal Swab  Result Value Ref Range Status   MRSA by PCR Next Gen DETECTED (A) NOT DETECTED Final    Comment: RESULT CALLED TO, READ BACK BY AND VERIFIED WITH: FOUNDER, W.  RN AT 1202 ON 1203 ON 04/28/2023 BY MECIAL J. (NOTE) The GeneXpert MRSA Assay (FDA approved for NASAL specimens only), is one component of a comprehensive MRSA colonization  surveillance program. It is not intended to diagnose MRSA infection nor to guide or monitor treatment for MRSA infections. Test performance is not FDA approved in patients less than 59 years old. Performed at [facility], [address] [place], [place] [address]          Radiology Studies: VAS [place] LOWER EXTREMITY VENOUS (DVT)  Result Date: 05/01/2023  Lower Venous DVT Study Patient Name:  Hannah [name]  Date of Exam:   05/01/2023 Medical Rec #: [id number]            Accession #:    [id number] Date of Birth: 20-May-1946            Patient Gender: F Patient Age:   77 years Exam Location:  [facility] Procedure:      VAS [place] LOWER EXTREMITY VENOUS (DVT) Referring Phys: Gloris Manchester TURNER --------------------------------------------------------------------------------  Indications: Swelling, and Edema.  Risk Factors: Obesity. Limitations: Body habitus. Comparison Study: No significant changes seen since previous exam 09/14/16 Performing Technologist: Shona Simpson  Examination Guidelines: A complete evaluation includes B-mode imaging, spectral Doppler, color Doppler, and power Doppler as needed of all accessible portions of each vessel. Bilateral testing is considered an integral part of a complete examination. Limited examinations for reoccurring indications may be performed as noted. The reflux portion of the exam is performed with the patient in reverse Trendelenburg.  +---------+---------------+---------+-----------+----------+---------------+ RIGHT    CompressibilityPhasicitySpontaneityPropertiesThrombus Aging  +---------+---------------+---------+-----------+----------+---------------+ CFV      Full                                                         +---------+---------------+---------+-----------+----------+---------------+ SFJ      Full                                                          +---------+---------------+---------+-----------+----------+---------------+ FV Prox  Full                                                         +---------+---------------+---------+-----------+----------+---------------+ FV Mid   Full                                                         +---------+---------------+---------+-----------+----------+---------------+  Special Requests   Final    BOTTLES DRAWN AEROBIC AND ANAEROBIC Blood Culture adequate volume Performed at [facility], [address] [place], [place] [address]    Culture   Final    NO GROWTH 5 DAYS Performed at [facility], [address], [place], [place] [address]    Report Status 05/02/2023 FINAL  Final  MRSA Next Gen by PCR, Nasal     Status: Abnormal   Collection Time: 04/28/23  9:06 AM   Specimen: Nasal Mucosa; Nasal Swab  Result Value Ref Range Status   MRSA by PCR Next Gen DETECTED (A) NOT DETECTED Final    Comment: RESULT CALLED TO, READ BACK BY AND VERIFIED WITH: FOUNDER, W.  RN AT 1202 ON 1203 ON 04/28/2023 BY MECIAL J. (NOTE) The GeneXpert MRSA Assay (FDA approved for NASAL specimens only), is one component of a comprehensive MRSA colonization  surveillance program. It is not intended to diagnose MRSA infection nor to guide or monitor treatment for MRSA infections. Test performance is not FDA approved in patients less than 59 years old. Performed at [facility], [address] [place], [place] [address]          Radiology Studies: VAS [place] LOWER EXTREMITY VENOUS (DVT)  Result Date: 05/01/2023  Lower Venous DVT Study Patient Name:  Hannah [name]  Date of Exam:   05/01/2023 Medical Rec #: [id number]            Accession #:    [id number] Date of Birth: 20-May-1946            Patient Gender: F Patient Age:   77 years Exam Location:  [facility] Procedure:      VAS [place] LOWER EXTREMITY VENOUS (DVT) Referring Phys: Gloris Manchester TURNER --------------------------------------------------------------------------------  Indications: Swelling, and Edema.  Risk Factors: Obesity. Limitations: Body habitus. Comparison Study: No significant changes seen since previous exam 09/14/16 Performing Technologist: Shona Simpson  Examination Guidelines: A complete evaluation includes B-mode imaging, spectral Doppler, color Doppler, and power Doppler as needed of all accessible portions of each vessel. Bilateral testing is considered an integral part of a complete examination. Limited examinations for reoccurring indications may be performed as noted. The reflux portion of the exam is performed with the patient in reverse Trendelenburg.  +---------+---------------+---------+-----------+----------+---------------+ RIGHT    CompressibilityPhasicitySpontaneityPropertiesThrombus Aging  +---------+---------------+---------+-----------+----------+---------------+ CFV      Full                                                         +---------+---------------+---------+-----------+----------+---------------+ SFJ      Full                                                          +---------+---------------+---------+-----------+----------+---------------+ FV Prox  Full                                                         +---------+---------------+---------+-----------+----------+---------------+ FV Mid   Full                                                         +---------+---------------+---------+-----------+----------+---------------+  Special Requests   Final    BOTTLES DRAWN AEROBIC AND ANAEROBIC Blood Culture adequate volume Performed at [facility], [address] [place], [place] [address]    Culture   Final    NO GROWTH 5 DAYS Performed at [facility], [address], [place], [place] [address]    Report Status 05/02/2023 FINAL  Final  MRSA Next Gen by PCR, Nasal     Status: Abnormal   Collection Time: 04/28/23  9:06 AM   Specimen: Nasal Mucosa; Nasal Swab  Result Value Ref Range Status   MRSA by PCR Next Gen DETECTED (A) NOT DETECTED Final    Comment: RESULT CALLED TO, READ BACK BY AND VERIFIED WITH: FOUNDER, W.  RN AT 1202 ON 1203 ON 04/28/2023 BY MECIAL J. (NOTE) The GeneXpert MRSA Assay (FDA approved for NASAL specimens only), is one component of a comprehensive MRSA colonization  surveillance program. It is not intended to diagnose MRSA infection nor to guide or monitor treatment for MRSA infections. Test performance is not FDA approved in patients less than 59 years old. Performed at [facility], [address] [place], [place] [address]          Radiology Studies: VAS [place] LOWER EXTREMITY VENOUS (DVT)  Result Date: 05/01/2023  Lower Venous DVT Study Patient Name:  Hannah [name]  Date of Exam:   05/01/2023 Medical Rec #: [id number]            Accession #:    [id number] Date of Birth: 20-May-1946            Patient Gender: F Patient Age:   77 years Exam Location:  [facility] Procedure:      VAS [place] LOWER EXTREMITY VENOUS (DVT) Referring Phys: Gloris Manchester TURNER --------------------------------------------------------------------------------  Indications: Swelling, and Edema.  Risk Factors: Obesity. Limitations: Body habitus. Comparison Study: No significant changes seen since previous exam 09/14/16 Performing Technologist: Shona Simpson  Examination Guidelines: A complete evaluation includes B-mode imaging, spectral Doppler, color Doppler, and power Doppler as needed of all accessible portions of each vessel. Bilateral testing is considered an integral part of a complete examination. Limited examinations for reoccurring indications may be performed as noted. The reflux portion of the exam is performed with the patient in reverse Trendelenburg.  +---------+---------------+---------+-----------+----------+---------------+ RIGHT    CompressibilityPhasicitySpontaneityPropertiesThrombus Aging  +---------+---------------+---------+-----------+----------+---------------+ CFV      Full                                                         +---------+---------------+---------+-----------+----------+---------------+ SFJ      Full                                                          +---------+---------------+---------+-----------+----------+---------------+ FV Prox  Full                                                         +---------+---------------+---------+-----------+----------+---------------+ FV Mid   Full                                                         +---------+---------------+---------+-----------+----------+---------------+  PROGRESS NOTE    Hannah [name]  UXL:[id number] DOB: 07-Oct-1945 DOA: 04/27/2023 PCP: Darrow Bussing, MD   Brief Narrative:  77 y.o. female with a history of atrial fibrillation, diabetes mellitus type 2, hypertension, diastolic heart failure, morbid obesity presented with a fall and increased leg swelling and was found to have evidence of bilateral leg cellulitis complicated by chronic leg wound.  Started on IV Rocephin.  Cardiology was consulted.  She is currently on IV Lasix.  PT recommended SNF placement.  Assessment & Plan:   Bilateral lower extremity cellulitis -With associated right anterior leg wound.  Wound care as per wound care RN recommendations -CT of both legs confirm cellulitis without evidence of underlying abscesses.  Continue Rocephin for a total of 7 days: Cellulitis improving   Acute on chronic diastolic heart failure -Likely secondary to medication nonadherence.  Strict input and output.  Daily weights.  Fluid restriction.  Negative balance of 7455 cc since admission -Cardiology following: On IV Lasix 80 mg twice a day currently.  Also on spironolactone.  Echo showed EF of 55 to 60% with moderate mitral valve regurgitation and moderate to severe tricuspid valve regurgitation  Acute metabolic encephalopathy/delirium -Likely related to above.  CT head unremarkable for acute process.  Symptoms appear to have resolved.  Persistent A-fib -Apparently patient missed several doses of Tikosyn as an outpatient.  Cardiology following: Recommending not to restart Tikosyn.  Continue Eliquis.  Currently rate controlled  CKD stage IIIb -Creatinine currently stable.  Monitor  Acute metabolic acidosis -Improved.  Monitor.  Anemia of chronic disease -From renal failure.  Hemoglobin currently stable.  Monitor.  Diabetes mellitus type 2 -A1c 6.2.  She is on Jardiance as an outpatient.  Carb modified diet.  Hypothyroidism -Continue levothyroxine  Hyperlipidemia -Continue  statin  Morbid obesity -Outpatient follow-up  Anxiety/depression -Continue buspirone and duloxetine  Physical deconditioning -PT recommends SNF placement.  TOC following   DVT prophylaxis: Eliquis Code Status: Full Family Communication: None at bedside Disposition Plan: Status is: Inpatient Remains inpatient appropriate because: Of severity of illness.  Possible discharge in 1 to 2 days if remains stable and cleared by cardiology    Consultants: Cardiology  Procedures: 2D echo  Antimicrobials: Rocephin from 04/27/2023 onwards   Subjective: Patient seen and examined at bedside.  Poor historian.  No chest pain, worsening shortness breath, fever reported.   Objective: Vitals:   05/02/23 1304 05/02/23 2121 05/03/23 0449 05/03/23 0500  BP: 114/76 108/60 134/78   Pulse: 77 82 77   Resp: 20 20 20    Temp: 98.6 F (37 C) 99 F (37.2 C) 97.8 F (36.6 C)   TempSrc: Oral Oral Oral   SpO2: 91% 96% 96%   Weight:    131.2 kg  Height:        Intake/Output Summary (Last 24 hours) at 05/03/2023 0804 Last data filed at 05/02/2023 2138 Gross per 24 hour  Intake --  Output 1200 ml  Net -1200 ml   Filed Weights   05/01/23 0137 05/02/23 0432 05/03/23 0500  Weight: 129.5 kg 128.7 kg 131.2 kg    Examination:  General: In no distress.  Still on room air.  Looks chronically ill and deconditioned. ENT/neck: No palpable neck masses or elevated JVD noted  respiratory: Bilateral decreased breath sounds at bases with basilar crackles  CVS: Rate currently controlled; S1 and S2 are heard abdominal: Soft, morbidly obese, nontender, distended mildly; no organomegaly, bowel sounds are heard Extremities: No clubbing; bilateral lower extremity edema present  PROGRESS NOTE    Hannah [name]  UXL:[id number] DOB: 07-Oct-1945 DOA: 04/27/2023 PCP: Darrow Bussing, MD   Brief Narrative:  77 y.o. female with a history of atrial fibrillation, diabetes mellitus type 2, hypertension, diastolic heart failure, morbid obesity presented with a fall and increased leg swelling and was found to have evidence of bilateral leg cellulitis complicated by chronic leg wound.  Started on IV Rocephin.  Cardiology was consulted.  She is currently on IV Lasix.  PT recommended SNF placement.  Assessment & Plan:   Bilateral lower extremity cellulitis -With associated right anterior leg wound.  Wound care as per wound care RN recommendations -CT of both legs confirm cellulitis without evidence of underlying abscesses.  Continue Rocephin for a total of 7 days: Cellulitis improving   Acute on chronic diastolic heart failure -Likely secondary to medication nonadherence.  Strict input and output.  Daily weights.  Fluid restriction.  Negative balance of 7455 cc since admission -Cardiology following: On IV Lasix 80 mg twice a day currently.  Also on spironolactone.  Echo showed EF of 55 to 60% with moderate mitral valve regurgitation and moderate to severe tricuspid valve regurgitation  Acute metabolic encephalopathy/delirium -Likely related to above.  CT head unremarkable for acute process.  Symptoms appear to have resolved.  Persistent A-fib -Apparently patient missed several doses of Tikosyn as an outpatient.  Cardiology following: Recommending not to restart Tikosyn.  Continue Eliquis.  Currently rate controlled  CKD stage IIIb -Creatinine currently stable.  Monitor  Acute metabolic acidosis -Improved.  Monitor.  Anemia of chronic disease -From renal failure.  Hemoglobin currently stable.  Monitor.  Diabetes mellitus type 2 -A1c 6.2.  She is on Jardiance as an outpatient.  Carb modified diet.  Hypothyroidism -Continue levothyroxine  Hyperlipidemia -Continue  statin  Morbid obesity -Outpatient follow-up  Anxiety/depression -Continue buspirone and duloxetine  Physical deconditioning -PT recommends SNF placement.  TOC following   DVT prophylaxis: Eliquis Code Status: Full Family Communication: None at bedside Disposition Plan: Status is: Inpatient Remains inpatient appropriate because: Of severity of illness.  Possible discharge in 1 to 2 days if remains stable and cleared by cardiology    Consultants: Cardiology  Procedures: 2D echo  Antimicrobials: Rocephin from 04/27/2023 onwards   Subjective: Patient seen and examined at bedside.  Poor historian.  No chest pain, worsening shortness breath, fever reported.   Objective: Vitals:   05/02/23 1304 05/02/23 2121 05/03/23 0449 05/03/23 0500  BP: 114/76 108/60 134/78   Pulse: 77 82 77   Resp: 20 20 20    Temp: 98.6 F (37 C) 99 F (37.2 C) 97.8 F (36.6 C)   TempSrc: Oral Oral Oral   SpO2: 91% 96% 96%   Weight:    131.2 kg  Height:        Intake/Output Summary (Last 24 hours) at 05/03/2023 0804 Last data filed at 05/02/2023 2138 Gross per 24 hour  Intake --  Output 1200 ml  Net -1200 ml   Filed Weights   05/01/23 0137 05/02/23 0432 05/03/23 0500  Weight: 129.5 kg 128.7 kg 131.2 kg    Examination:  General: In no distress.  Still on room air.  Looks chronically ill and deconditioned. ENT/neck: No palpable neck masses or elevated JVD noted  respiratory: Bilateral decreased breath sounds at bases with basilar crackles  CVS: Rate currently controlled; S1 and S2 are heard abdominal: Soft, morbidly obese, nontender, distended mildly; no organomegaly, bowel sounds are heard Extremities: No clubbing; bilateral lower extremity edema present

## 2023-05-03 NOTE — Progress Notes (Signed)
Hannah [name] Date of Exam: 04/28/2023 Medical Rec #:  [id number]           Height:       66.0 in Accession #:    [id number]          Weight:       306.0 lb Date of Birth:  26-Nov-1945           BSA:          2.396 m Patient Age:    77 years            BP:           145/86 mmHg Patient Gender: F                   HR:           82 bpm. Exam Location:  Inpatient  Procedure: 2D Echo, Cardiac Doppler, Color Doppler and Intracardiac Opacification Agent  Indications:    CHF-Acute Diastolic I50.31  History:        Patient has prior history of Echocardiogram examinations, most recent 01/18/2018. CHF, Arrythmias:Atrial Fibrillation; Risk Factors:Diabetes.  Sonographer:    Lucendia Herrlich RCS Referring Phys: [phone] JARED M GARDNER  IMPRESSIONS   1. Left ventricular ejection fraction, by estimation, is 55 to 60%. The left ventricle has normal function. Left ventricular endocardial border not optimally defined to evaluate regional wall motion. There is mild concentric left ventricular hypertrophy. Left ventricular diastolic parameters are indeterminate. There was not sufficient IV access for Definity contrast imaging. 2. Right ventricular systolic function is normal. The right ventricular size is not well visualized. There is moderately elevated pulmonary  artery systolic pressure. The estimated right ventricular systolic pressure is 54.9 mmHg. 3. Left atrial size was moderately dilated. 4. The mitral valve is grossly normal. Moderate mitral valve regurgitation. No evidence of mitral stenosis. 5. Tricuspid valve regurgitation is moderate to severe. 6. The aortic valve is tricuspid. Aortic valve regurgitation is not visualized. Aortic valve sclerosis is present, with no evidence of aortic valve stenosis. 7. The inferior vena cava is dilated in size with <50% respiratory variability, suggesting right atrial pressure of 15 mmHg.  Comparison(s): Prior images reviewed side by side. MR has improved from prior study, TR may be worse; difficult comparison to 2021 TEE.  FINDINGS Left Ventricle: Left ventricular ejection fraction, by estimation, is 55 to 60%. The left ventricle has normal function. Left ventricular endocardial border not optimally defined to evaluate regional wall motion. Definity contrast agent was given IV to delineate the left ventricular endocardial borders. The left ventricular internal cavity size was normal in size. There is mild concentric left ventricular hypertrophy. Left ventricular diastolic parameters are indeterminate.  Right Ventricle: The right ventricular size is not well visualized. No increase in right ventricular wall thickness. Right ventricular systolic function is normal. There is moderately elevated pulmonary artery systolic pressure. The tricuspid regurgitant velocity is 3.16 m/s, and with an assumed right atrial pressure of 15 mmHg, the estimated right ventricular systolic pressure is 54.9 mmHg.  Left Atrium: Left atrial size was moderately dilated.  Right Atrium: Right atrial size was normal in size.  Pericardium: There is no evidence of pericardial effusion.  Mitral Valve: The mitral valve is grossly normal. Moderate mitral valve regurgitation. No evidence of mitral valve stenosis.  Tricuspid Valve: The  tricuspid valve is normal in structure. Tricuspid valve regurgitation is moderate to severe.  Aortic Valve: The aortic valve is tricuspid. Aortic valve regurgitation is not visualized.  Hannah [name] Date of Exam: 04/28/2023 Medical Rec #:  [id number]           Height:       66.0 in Accession #:    [id number]          Weight:       306.0 lb Date of Birth:  26-Nov-1945           BSA:          2.396 m Patient Age:    77 years            BP:           145/86 mmHg Patient Gender: F                   HR:           82 bpm. Exam Location:  Inpatient  Procedure: 2D Echo, Cardiac Doppler, Color Doppler and Intracardiac Opacification Agent  Indications:    CHF-Acute Diastolic I50.31  History:        Patient has prior history of Echocardiogram examinations, most recent 01/18/2018. CHF, Arrythmias:Atrial Fibrillation; Risk Factors:Diabetes.  Sonographer:    Lucendia Herrlich RCS Referring Phys: [phone] JARED M GARDNER  IMPRESSIONS   1. Left ventricular ejection fraction, by estimation, is 55 to 60%. The left ventricle has normal function. Left ventricular endocardial border not optimally defined to evaluate regional wall motion. There is mild concentric left ventricular hypertrophy. Left ventricular diastolic parameters are indeterminate. There was not sufficient IV access for Definity contrast imaging. 2. Right ventricular systolic function is normal. The right ventricular size is not well visualized. There is moderately elevated pulmonary  artery systolic pressure. The estimated right ventricular systolic pressure is 54.9 mmHg. 3. Left atrial size was moderately dilated. 4. The mitral valve is grossly normal. Moderate mitral valve regurgitation. No evidence of mitral stenosis. 5. Tricuspid valve regurgitation is moderate to severe. 6. The aortic valve is tricuspid. Aortic valve regurgitation is not visualized. Aortic valve sclerosis is present, with no evidence of aortic valve stenosis. 7. The inferior vena cava is dilated in size with <50% respiratory variability, suggesting right atrial pressure of 15 mmHg.  Comparison(s): Prior images reviewed side by side. MR has improved from prior study, TR may be worse; difficult comparison to 2021 TEE.  FINDINGS Left Ventricle: Left ventricular ejection fraction, by estimation, is 55 to 60%. The left ventricle has normal function. Left ventricular endocardial border not optimally defined to evaluate regional wall motion. Definity contrast agent was given IV to delineate the left ventricular endocardial borders. The left ventricular internal cavity size was normal in size. There is mild concentric left ventricular hypertrophy. Left ventricular diastolic parameters are indeterminate.  Right Ventricle: The right ventricular size is not well visualized. No increase in right ventricular wall thickness. Right ventricular systolic function is normal. There is moderately elevated pulmonary artery systolic pressure. The tricuspid regurgitant velocity is 3.16 m/s, and with an assumed right atrial pressure of 15 mmHg, the estimated right ventricular systolic pressure is 54.9 mmHg.  Left Atrium: Left atrial size was moderately dilated.  Right Atrium: Right atrial size was normal in size.  Pericardium: There is no evidence of pericardial effusion.  Mitral Valve: The mitral valve is grossly normal. Moderate mitral valve regurgitation. No evidence of mitral valve stenosis.  Tricuspid Valve: The  tricuspid valve is normal in structure. Tricuspid valve regurgitation is moderate to severe.  Aortic Valve: The aortic valve is tricuspid. Aortic valve regurgitation is not visualized.  Hannah [name] Date of Exam: 04/28/2023 Medical Rec #:  [id number]           Height:       66.0 in Accession #:    [id number]          Weight:       306.0 lb Date of Birth:  26-Nov-1945           BSA:          2.396 m Patient Age:    77 years            BP:           145/86 mmHg Patient Gender: F                   HR:           82 bpm. Exam Location:  Inpatient  Procedure: 2D Echo, Cardiac Doppler, Color Doppler and Intracardiac Opacification Agent  Indications:    CHF-Acute Diastolic I50.31  History:        Patient has prior history of Echocardiogram examinations, most recent 01/18/2018. CHF, Arrythmias:Atrial Fibrillation; Risk Factors:Diabetes.  Sonographer:    Lucendia Herrlich RCS Referring Phys: [phone] JARED M GARDNER  IMPRESSIONS   1. Left ventricular ejection fraction, by estimation, is 55 to 60%. The left ventricle has normal function. Left ventricular endocardial border not optimally defined to evaluate regional wall motion. There is mild concentric left ventricular hypertrophy. Left ventricular diastolic parameters are indeterminate. There was not sufficient IV access for Definity contrast imaging. 2. Right ventricular systolic function is normal. The right ventricular size is not well visualized. There is moderately elevated pulmonary  artery systolic pressure. The estimated right ventricular systolic pressure is 54.9 mmHg. 3. Left atrial size was moderately dilated. 4. The mitral valve is grossly normal. Moderate mitral valve regurgitation. No evidence of mitral stenosis. 5. Tricuspid valve regurgitation is moderate to severe. 6. The aortic valve is tricuspid. Aortic valve regurgitation is not visualized. Aortic valve sclerosis is present, with no evidence of aortic valve stenosis. 7. The inferior vena cava is dilated in size with <50% respiratory variability, suggesting right atrial pressure of 15 mmHg.  Comparison(s): Prior images reviewed side by side. MR has improved from prior study, TR may be worse; difficult comparison to 2021 TEE.  FINDINGS Left Ventricle: Left ventricular ejection fraction, by estimation, is 55 to 60%. The left ventricle has normal function. Left ventricular endocardial border not optimally defined to evaluate regional wall motion. Definity contrast agent was given IV to delineate the left ventricular endocardial borders. The left ventricular internal cavity size was normal in size. There is mild concentric left ventricular hypertrophy. Left ventricular diastolic parameters are indeterminate.  Right Ventricle: The right ventricular size is not well visualized. No increase in right ventricular wall thickness. Right ventricular systolic function is normal. There is moderately elevated pulmonary artery systolic pressure. The tricuspid regurgitant velocity is 3.16 m/s, and with an assumed right atrial pressure of 15 mmHg, the estimated right ventricular systolic pressure is 54.9 mmHg.  Left Atrium: Left atrial size was moderately dilated.  Right Atrium: Right atrial size was normal in size.  Pericardium: There is no evidence of pericardial effusion.  Mitral Valve: The mitral valve is grossly normal. Moderate mitral valve regurgitation. No evidence of mitral valve stenosis.  Tricuspid Valve: The  tricuspid valve is normal in structure. Tricuspid valve regurgitation is moderate to severe.  Aortic Valve: The aortic valve is tricuspid. Aortic valve regurgitation is not visualized.  Hannah [name] Date of Exam: 04/28/2023 Medical Rec #:  [id number]           Height:       66.0 in Accession #:    [id number]          Weight:       306.0 lb Date of Birth:  26-Nov-1945           BSA:          2.396 m Patient Age:    77 years            BP:           145/86 mmHg Patient Gender: F                   HR:           82 bpm. Exam Location:  Inpatient  Procedure: 2D Echo, Cardiac Doppler, Color Doppler and Intracardiac Opacification Agent  Indications:    CHF-Acute Diastolic I50.31  History:        Patient has prior history of Echocardiogram examinations, most recent 01/18/2018. CHF, Arrythmias:Atrial Fibrillation; Risk Factors:Diabetes.  Sonographer:    Lucendia Herrlich RCS Referring Phys: [phone] JARED M GARDNER  IMPRESSIONS   1. Left ventricular ejection fraction, by estimation, is 55 to 60%. The left ventricle has normal function. Left ventricular endocardial border not optimally defined to evaluate regional wall motion. There is mild concentric left ventricular hypertrophy. Left ventricular diastolic parameters are indeterminate. There was not sufficient IV access for Definity contrast imaging. 2. Right ventricular systolic function is normal. The right ventricular size is not well visualized. There is moderately elevated pulmonary  artery systolic pressure. The estimated right ventricular systolic pressure is 54.9 mmHg. 3. Left atrial size was moderately dilated. 4. The mitral valve is grossly normal. Moderate mitral valve regurgitation. No evidence of mitral stenosis. 5. Tricuspid valve regurgitation is moderate to severe. 6. The aortic valve is tricuspid. Aortic valve regurgitation is not visualized. Aortic valve sclerosis is present, with no evidence of aortic valve stenosis. 7. The inferior vena cava is dilated in size with <50% respiratory variability, suggesting right atrial pressure of 15 mmHg.  Comparison(s): Prior images reviewed side by side. MR has improved from prior study, TR may be worse; difficult comparison to 2021 TEE.  FINDINGS Left Ventricle: Left ventricular ejection fraction, by estimation, is 55 to 60%. The left ventricle has normal function. Left ventricular endocardial border not optimally defined to evaluate regional wall motion. Definity contrast agent was given IV to delineate the left ventricular endocardial borders. The left ventricular internal cavity size was normal in size. There is mild concentric left ventricular hypertrophy. Left ventricular diastolic parameters are indeterminate.  Right Ventricle: The right ventricular size is not well visualized. No increase in right ventricular wall thickness. Right ventricular systolic function is normal. There is moderately elevated pulmonary artery systolic pressure. The tricuspid regurgitant velocity is 3.16 m/s, and with an assumed right atrial pressure of 15 mmHg, the estimated right ventricular systolic pressure is 54.9 mmHg.  Left Atrium: Left atrial size was moderately dilated.  Right Atrium: Right atrial size was normal in size.  Pericardium: There is no evidence of pericardial effusion.  Mitral Valve: The mitral valve is grossly normal. Moderate mitral valve regurgitation. No evidence of mitral valve stenosis.  Tricuspid Valve: The  tricuspid valve is normal in structure. Tricuspid valve regurgitation is moderate to severe.  Aortic Valve: The aortic valve is tricuspid. Aortic valve regurgitation is not visualized.  TEE  ECHO TEE 05/03/2020  Narrative TRANSESOPHOGEAL ECHO REPORT    Patient Name:   ENID [name] Date of Exam: 05/03/2020 Medical Rec #:  [id number]           Height:       66.0 in Accession #:    [id number]          Weight:       268.0 lb Date of Birth:  11-07-1945           BSA:          2.265 m Patient Age:    74 years            BP:           117/54 mmHg Patient Gender: F                   HR:           71 bpm. Exam Location:  Inpatient  Procedure: Transesophageal Echo  Indications:    atrial fibrillation  History:        Patient has prior history of Echocardiogram examinations, most recent 11/27/2019. Arrythmias:Atrial Fibrillation; Risk Factors:Diabetes and Hypertension.  Sonographer:    Celene Skeen RDCS (AE) Referring Phys: [phone] [place]  PROCEDURE: The transesophogeal probe was passed without difficulty through the esophogus of the patient. Sedation performed by different physician. The patient developed no complications during the procedure.  IMPRESSIONS   1. Left ventricular ejection fraction, by estimation, is 60 to 65%. The left ventricle has normal function. 2. Right ventricular systolic function is normal. The right ventricular size is mildly enlarged. There is moderately elevated pulmonary artery systolic pressure. 3. Left atrial size was moderately dilated. No left atrial/left atrial appendage thrombus was detected. 4. Right atrial size was mildly  dilated. 5. The mitral valve is abnormal. Moderate mitral valve regurgitation. ERO 0.2 cm^2, RV 42 cc. 6. Tricuspid valve regurgitation is moderate. 7. The aortic valve is tricuspid. Aortic valve regurgitation is not visualized. No aortic stenosis is present.  FINDINGS Left Ventricle: Left ventricular ejection fraction, by estimation, is 60 to 65%. The left ventricle has normal function. The left ventricular internal cavity size was normal in size.  Right Ventricle: The right ventricular size is mildly enlarged. Right vetricular wall thickness was not assessed. Right ventricular systolic function is normal. There is moderately elevated pulmonary artery systolic pressure. The tricuspid regurgitant velocity is 3.15 m/s, and with an assumed right atrial pressure of 8 mmHg, the estimated right ventricular systolic pressure is 47.7 mmHg.  Left Atrium: Left atrial size was moderately dilated. No left atrial/left atrial appendage thrombus was detected.  Right Atrium: Right atrial size was mildly dilated.  Pericardium: There is no evidence of pericardial effusion.  Mitral Valve: The mitral valve is abnormal. Moderate mitral valve regurgitation.  Tricuspid Valve: The tricuspid valve is normal in structure. Tricuspid valve regurgitation is moderate.  Aortic Valve: The aortic valve is tricuspid. Aortic valve regurgitation is not visualized. No aortic stenosis is present.  Pulmonic Valve: The pulmonic valve was not well visualized. Pulmonic valve regurgitation is not visualized.  Aorta: The aortic root and ascending aorta are structurally normal, with no evidence of dilitation.  IAS/Shunts: No atrial level shunt detected by color flow Doppler.   MR Peak grad:    145.4 mmHg  TRICUSPID VALVE MR Mean grad:    96.0 mmHg   TR Peak grad:   39.7 mmHg MR Vmax:

## 2023-05-03 NOTE — Plan of Care (Signed)

## 2023-05-04 DIAGNOSIS — I2721 Secondary pulmonary arterial hypertension: Secondary | ICD-10-CM | POA: Diagnosis not present

## 2023-05-04 DIAGNOSIS — I5033 Acute on chronic diastolic (congestive) heart failure: Secondary | ICD-10-CM | POA: Diagnosis not present

## 2023-05-04 DIAGNOSIS — I2781 Cor pulmonale (chronic): Secondary | ICD-10-CM

## 2023-05-04 DIAGNOSIS — G4733 Obstructive sleep apnea (adult) (pediatric): Secondary | ICD-10-CM

## 2023-05-04 DIAGNOSIS — I4811 Longstanding persistent atrial fibrillation: Secondary | ICD-10-CM

## 2023-05-04 DIAGNOSIS — G9341 Metabolic encephalopathy: Secondary | ICD-10-CM | POA: Diagnosis not present

## 2023-05-04 LAB — BASIC METABOLIC PANEL
Anion gap: 10 (ref 5–15)
BUN: 38 mg/dL — ABNORMAL HIGH (ref 8–23)
CO2: 29 mmol/L (ref 22–32)
Calcium: 8.8 mg/dL — ABNORMAL LOW (ref 8.9–10.3)
Chloride: 100 mmol/L (ref 98–111)
Creatinine, Ser: 1.04 mg/dL — ABNORMAL HIGH (ref 0.44–1.00)
GFR, Estimated: 56 mL/min — ABNORMAL LOW (ref >60)
Glucose, Bld: 125 mg/dL — ABNORMAL HIGH (ref 70–99)
Potassium: 3.8 mmol/L (ref 3.5–5.1)
Sodium: 139 mmol/L (ref 135–145)

## 2023-05-04 LAB — MAGNESIUM: Magnesium: 2.2 mg/dL (ref 1.7–2.4)

## 2023-05-04 LAB — GLUCOSE, CAPILLARY
Glucose-Capillary: 115 mg/dL — ABNORMAL HIGH (ref 70–99)
Glucose-Capillary: 115 mg/dL — ABNORMAL HIGH (ref 70–99)
Glucose-Capillary: 157 mg/dL — ABNORMAL HIGH (ref 70–99)
Glucose-Capillary: 121 mg/dL — ABNORMAL HIGH (ref 70–99)
Glucose-Capillary: 105 mg/dL — ABNORMAL HIGH (ref 70–99)

## 2023-05-04 NOTE — Progress Notes (Signed)
PROGRESS NOTE    Hannah [name]  QMV:[id number] DOB: [birth year] DOA: 04/27/2023 PCP: Darrow Bussing, MD   Brief Narrative:  77 y.o. female with a history of atrial fibrillation, diabetes mellitus type 2, hypertension, diastolic heart failure, morbid obesity presented with a fall and increased leg swelling and was found to have evidence of bilateral leg cellulitis complicated by chronic leg wound.  Started on IV Rocephin.  Cardiology was consulted.  She is currently on IV Lasix.  PT recommended SNF placement.  Assessment & Plan:   Bilateral lower extremity cellulitis -With associated right anterior leg wound.  Wound care as per wound care RN recommendations -CT of both legs confirm cellulitis without evidence of underlying abscesses.  Has received Rocephin for 7 days.  Cellulitis has much improved.  DC antibiotics.  Acute on chronic diastolic heart failure -Likely secondary to medication nonadherence.  Strict input and output.  Daily weights.  Fluid restriction.  Negative balance of 8530 cc since admission -Cardiology following: On IV Lasix 80 mg twice a day currently.  Also on spironolactone.  Echo showed EF of 55 to 60% with moderate mitral valve regurgitation and moderate to severe tricuspid valve regurgitation  Acute metabolic encephalopathy/delirium -Likely related to above.  CT head unremarkable for acute process.  Symptoms appear to have resolved.  Still slow to respond and a poor historian; might have underlying undiagnosed dementia  Persistent A-fib -Apparently patient missed several doses of Tikosyn as an outpatient.  Cardiology following: Recommending not to restart Tikosyn.  Continue Eliquis.  Currently rate controlled  CKD stage IIIb -Creatinine currently stable.  Monitor  Acute metabolic acidosis -Improved.  Monitor.  Anemia of chronic disease -From renal failure.  Hemoglobin currently stable.  Monitor.  Diabetes mellitus type 2 -A1c 6.2.  She is on Jardiance as  an outpatient.  Carb modified diet.  Hypothyroidism -Continue levothyroxine  Hyperlipidemia -Continue statin  Morbid obesity -Outpatient follow-up  Anxiety/depression -Continue buspirone and duloxetine  Physical deconditioning -PT recommends SNF placement.  TOC following   DVT prophylaxis: Eliquis Code Status: Full Family Communication: None at bedside Disposition Plan: Status is: Inpatient Remains inpatient appropriate because: Of severity of illness.  Possible discharge in 1 to 2 days if remains stable and cleared by cardiology    Consultants: Cardiology  Procedures: 2D echo  Antimicrobials: Rocephin from 04/27/2023 onwards   Subjective: Patient seen and examined at bedside.  Poor historian.  No vomiting, fever, chest pain or shortness of breath reported  objective: Vitals:   05/03/23 0500 05/03/23 1018 05/03/23 1937 05/04/23 0411  BP:  (!) 147/75 115/61 (!) 132/98  Pulse:  77 79 69  Resp:  18 18 17   Temp:  98.3 F (36.8 C) 97.9 F (36.6 C) 98 F (36.7 C)  TempSrc:  Oral Oral Oral  SpO2:   96% 99%  Weight: 131.2 kg     Height:        Intake/Output Summary (Last 24 hours) at 05/04/2023 0804 Last data filed at 05/04/2023 0735 Gross per 24 hour  Intake 1200 ml  Output 2275 ml  Net -1075 ml   Filed Weights   05/01/23 0137 05/02/23 0432 05/03/23 0500  Weight: 129.5 kg 128.7 kg 131.2 kg    Examination:  General: Remains in no distress and on room air.  Looks chronically ill and deconditioned. ENT/neck: No obvious elevation or thyromegaly noted respiratory: Decreased breath sounds at bases bilaterally with some crackles  CVS: S1-S2 heard; rate controlled  abdominal: Soft, morbidly obese, nontender; slightly distended;  no organomegaly, bowel sounds are normally heard Extremities: Lower extremity edema present bilaterally; no cyanosis  CNS: Awake; very slow to respond and a poor historian.  No obvious focal deficits noted  lymph: No obvious  lymphadenopathy noted skin: Bilateral lower extremity chronic skin changes along with erythema, tenderness and dressing on right anterior shin.  Erythema has much improved  psych: Not agitated.  Flat affect. Musculoskeletal: No obvious joint erythema/swelling Data Reviewed: I have personally reviewed following labs and imaging studies  CBC: Recent Labs  Lab 04/27/23 1519 04/29/23 0404  WBC 10.9* 7.3  NEUTROABS 10.2*  --   HGB 10.0* 9.5*  HCT 34.3* 33.2*  MCV 96.1 96.5  PLT 199 194   Basic Metabolic Panel: Recent Labs  Lab 04/30/23 0407 05/01/23 0346 05/02/23 0351 05/03/23 0409 05/04/23 0340  NA 140 139 140 141 139  K 3.6 3.7 4.1 3.9 3.8  CL 109 106 102 101 100  CO2 20* 24 28 30 29   GLUCOSE 117* 109* 113* 115* 125*  BUN 41* 39* 37* 38* 38*  CREATININE 1.19* 1.07* 1.05* 1.07* 1.04*  CALCIUM 8.1* 8.1* 8.6* 8.7* 8.8*  MG 2.3 2.1 2.0 2.0 2.2   GFR: Estimated Creatinine Clearance: 64 mL/min (A) (by C-G formula based on SCr of 1.04 mg/dL (H)). Liver Function Tests: Recent Labs  Lab 04/27/23 1519  AST 21  ALT 20  ALKPHOS 62  BILITOT 1.8*  PROT 8.0  ALBUMIN 3.6   No results for input(s): "LIPASE", "AMYLASE" in the last 168 hours. Recent Labs  Lab 04/27/23 1642  AMMONIA 27   Coagulation Profile: No results for input(s): "INR", "PROTIME" in the last 168 hours. Cardiac Enzymes: No results for input(s): "CKTOTAL", "CKMB", "CKMBINDEX", "TROPONINI" in the last 168 hours. BNP (last 3 results) No results for input(s): "PROBNP" in the last 8760 hours. HbA1C: No results for input(s): "HGBA1C" in the last 72 hours.  CBG: Recent Labs  Lab 05/03/23 1250 05/03/23 1655 05/03/23 1935 05/03/23 2329 05/04/23 0409  GLUCAP 119* 132* 133* 111* 115*   Lipid Profile: No results for input(s): "CHOL", "HDL", "LDLCALC", "TRIG", "CHOLHDL", "LDLDIRECT" in the last 72 hours.  Thyroid Function Tests: No results for input(s): "TSH", "T4TOTAL", "FREET4", "T3FREE", "THYROIDAB" in  the last 72 hours. Anemia Panel: No results for input(s): "VITAMINB12", "FOLATE", "FERRITIN", "TIBC", "IRON", "RETICCTPCT" in the last 72 hours. Sepsis Labs: Recent Labs  Lab 04/27/23 1537 04/27/23 1750  LATICACIDVEN 1.2 1.1    Recent Results (from the past 240 hour(s))  Blood culture (routine x 2)     Status: None   Collection Time: 04/27/23  4:31 PM   Specimen: BLOOD RIGHT HAND  Result Value Ref Range Status   Specimen Description   Final    BLOOD RIGHT HAND Performed at [facility], [address] [place], [place] [address]    Special Requests   Final    BOTTLES DRAWN AEROBIC AND ANAEROBIC Blood Culture adequate volume Performed at [facility], [address] [place], [place] [address]    Culture   Final    NO GROWTH 5 DAYS Performed at [facility], [address], [place], [place] [address]    Report Status 05/02/2023 FINAL  Final  Blood culture (routine x 2)     Status: None   Collection Time: 04/27/23  4:42 PM   Specimen: BLOOD  Result Value Ref Range Status   Specimen Description   Final    BLOOD RIGHT ANTECUBITAL Performed at [facility], [address] [place], [place] [address]    Special Requests   Final    BOTTLES DRAWN AEROBIC AND ANAEROBIC Blood Culture adequate volume Performed at [facility], [address], [place], [place] [address]    Culture   Final    NO GROWTH 5 DAYS Performed at [facility], [address], [place], [place] [address]    Report Status 05/02/2023 FINAL  Final  MRSA Next Gen by PCR, Nasal     Status: Abnormal   Collection Time: 04/28/23  9:06 AM   Specimen: Nasal Mucosa; Nasal Swab  Result Value Ref Range Status   MRSA by PCR Next Gen DETECTED (A) NOT DETECTED Final    Comment: RESULT CALLED TO, READ BACK BY AND VERIFIED WITH: FOUNDER, W.  RN AT 1202 ON 1203 ON 04/28/2023 BY MECIAL J. (NOTE) The GeneXpert MRSA Assay (FDA approved for NASAL  specimens only), is one component of a comprehensive MRSA colonization surveillance program. It is not intended to diagnose MRSA infection nor to guide or monitor treatment for MRSA infections. Test performance is not FDA approved in patients less than 33 years old. Performed at [facility], [address], [place], [place] [address]          Radiology Studies: No results found.      Scheduled Meds:  apixaban  5 mg Oral BID   busPIRone  15 mg Oral TID   DULoxetine  90 mg Oral Daily   ferrous sulfate  325 mg Oral BID WC   furosemide  80 mg Intravenous BID   insulin aspart  0-9 Units Subcutaneous Q4H   levothyroxine  125 mcg Oral Q0600   lidocaine  1 patch Transdermal Q24H   melatonin  5 mg Oral QHS   montelukast  10 mg Oral QHS   rosuvastatin  5 mg Oral Daily   sodium chloride flush  3 mL Intravenous Q12H   spironolactone  12.5 mg Oral Daily   Continuous Infusions:  cefTRIAXone (ROCEPHIN)  IV 2 g (05/03/23 1013)          Glade Lloyd, MD [facility] 05/04/2023, 8:04 AM  '

## 2023-05-04 NOTE — Progress Notes (Addendum)
Patient Name: Hannah [name] Date of Encounter: 05/04/2023 [facility] Cardiologist: Will Jorja Loa, MD   Interval Summary  .    Denies any shortness of breath still has significant significant peripheral edema and is slightly uncomfortable.  Vital Signs .    Vitals:   05/03/23 1018 05/03/23 1937 05/04/23 0411 05/04/23 0800  BP: (!) 147/75 115/61 (!) 132/98   Pulse: 77 79 69   Resp: 18 18 17 17   Temp: 98.3 F (36.8 C) 97.9 F (36.6 C) 98 F (36.7 C)   TempSrc: Oral Oral Oral   SpO2:  96% 99%   Weight:      Height:        Intake/Output Summary (Last 24 hours) at 05/04/2023 1139 Last data filed at 05/04/2023 0900 Gross per 24 hour  Intake 1440 ml  Output 2275 ml  Net -835 ml      05/03/2023    5:00 AM 05/02/2023    4:32 AM 05/01/2023    1:37 AM  Last 3 Weights  Weight (lbs) 289 lb 3.9 oz 283 lb 11.7 oz 285 lb 7.9 oz  Weight (kg) 131.2 kg 128.7 kg 129.5 kg      Telemetry/ECG    Atrial fibrillation heart rates in the 80s.  2 beats of NSVT.- Personally Reviewed  CV Studies    Echocardiogram 04/28/2023 1. Left ventricular ejection fraction, by estimation, is 55 to 60%. The  left ventricle has normal function. Left ventricular endocardial border  not optimally defined to evaluate regional wall motion. There is mild  concentric left ventricular  hypertrophy. Left ventricular diastolic parameters are indeterminate.  There was not sufficient IV access for Definity contrast imaging.   2. Right ventricular systolic function is normal. The right ventricular  size is not well visualized. There is moderately elevated pulmonary artery  systolic pressure. The estimated right ventricular systolic pressure is  54.9 mmHg.   3. Left atrial size was moderately dilated.   4. The mitral valve is grossly normal. Moderate mitral valve  regurgitation. No evidence of mitral stenosis.   5. Tricuspid valve regurgitation is moderate to severe.   6. The aortic  valve is tricuspid. Aortic valve regurgitation is not  visualized. Aortic valve sclerosis is present, with no evidence of aortic  valve stenosis.   7. The inferior vena cava is dilated in size with <50% respiratory  variability, suggesting right atrial pressure of 15 mmHg.   Comparison(s): Prior images reviewed side by side. MR has improved from  prior study, TR may be worse; difficult comparison to 2021 TEE.   Physical Exam .   GEN: No acute distress.   Neck: No JVD Cardiac: Irregular irregular Respiratory: Clear to auscultation bilaterally. GI: Soft, nontender, non-distended  MS: 3+ pitting edema L>R.  Bilateral stasis wounds  Assessment & Plan .     Acute on chronic HFpEF Chronic venous insufficiency Seems to be progressing decently well on IV Lasix 80 mg twice daily.  Weight on admission 307 pounds.  Has been downtrending since then.  Today's weight is up 6 pounds at 289 however question accuracy of this.  Diuresed 1.7 L in the last 24 hours.  Still has significant peripheral edema not sure how much of this is due to her baseline venous insufficiency versus heart failure.  Generally been doing well and improving will continue same diuresis strategy. Continue IV Lasix 80 mg twice daily and spironolactone 12.5 mg.  Can resume Jardiance once after diuresis. Echo shows EF 55  to 60% with normal RV function.  Moderately dilated LA. RVSP 54.9.  Longstanding persistent atrial fibrillation NSVT Had ablation in 2021 and was on Tikosyn in July 2022.  She was lost to follow-up last couple years and had not been refilling her Tikosyn or her Eliquis all of this year.  She is rate controlled currently with heart rates in the 80s. Continue Eliquis 5 mg twice daily She has rare occurrences of NSVT.  Maintain potassium greater than 4 magnesium greater than 2.  Moderate MR Moderate to severe TR Likely due to volume status.  CKD Creatinine on admission 1.3 and has progressively improved with  diuresis.  Currently 1.04.  Hyperlipidemia Continue rosuvastatin 5 mg daily  OSA on CPAP Lower extremity cellulitis Delirium with hallucinations appears to have resolved type 2 diabetes Deconditioning Per primary team.  For questions or updates, please contact [facility] Please consult [email or web address] for contact info under        Signed, Abagail Kitchens, PA-C    I have seen and examined the patient along with Abagail Kitchens, PA-C  .  I have reviewed the chart, notes and new data.  I agree with PA/NP's note.  Key new complaints: Lying fully supine in bed and speaking in lengthy uninterrupted sentences without evidence of dyspnea.  Key examination changes:  Although she still has lower extremity edema, there is substantial wrinkling and there is no longer any weeping from the left pretibial wound.  Unable to see the jugular veins due to body habitus.  Irregular rhythm, holosystolic murmur at the left lower sternal border is very faint, clear lungs. Today's weight appears to be inaccurate.  Urine output remains quite high. Key new findings / data: Stable, mildly abnormal renal parameters  PLAN: She has primarily acute exacerbation of chronic right heart failure. (Although assessment of diastolic left heart function and atrial fibrillation is challenging, she has good diastolic mitral annular velocities on the current echocardiogram.  She does have mitral insufficiency, but this has improved compared to previous studies) Suspect her right failure is primarily due to insufficiently treated obstructive sleep apnea. Dry weight is uncertain, but based on last year's record seems to be around 280 pounds (she still probably needs 5-10 pounds of diuresis). Her problems with edema and lower extremity wounds and cellulitis are related not only to right heart failure, but also to peripheral venous insufficiency.  She has had previous venous ablation). Even after we achieve "dry  weight"/euvolemia it will be very important to continue to keep the legs elevated and/or wear compression stockings to avoid recurrent edema and nonhealing wounds.  Anticipate she can transition to oral diuretics and therefore may be ready for discharge to a skilled nursing facility in 48-72 hours.   Thurmon Fair, MD, [facility] [phone] 05/04/2023, 12:06 PM

## 2023-05-04 NOTE — Progress Notes (Signed)
PT Cancellation Note  Patient Details Name: Hannah [name] MRN: [id number] DOB: 03-04-1946   Cancelled Treatment:     Therapist in to see pt who politely declined due to fatigue and requested to continue PT tomorrow prior to lunch.    Jannet Askew 05/04/2023, 4:48 PM

## 2023-05-04 NOTE — Plan of Care (Signed)
  Problem: Fluid Volume: Goal: Ability to maintain a balanced intake and output will improve Outcome: Progressing   Problem: Nutritional: Goal: Maintenance of adequate nutrition will improve Outcome: Progressing   Problem: Clinical Measurements: Goal: Respiratory complications will improve Outcome: Progressing Goal: Cardiovascular complication will be avoided Outcome: Progressing   Problem: Nutrition: Goal: Adequate nutrition will be maintained Outcome: Progressing   Problem: Coping: Goal: Level of anxiety will decrease Outcome: Progressing   Problem: Elimination: Goal: Will not experience complications related to bowel motility Outcome: Progressing Goal: Will not experience complications related to urinary retention Outcome: Progressing   Problem: Pain Managment: Goal: General experience of comfort will improve Outcome: Progressing   Problem: Safety: Goal: Ability to remain free from injury will improve Outcome: Progressing

## 2023-05-04 NOTE — TOC Progression Note (Signed)
Transition of Care [facility]) - Progression Note    Patient Details  Name: Hannah [name] MRN: [id number] Date of Birth: 03/09/1946  [facility] CM/SW Contact  Larrie Kass, LCSW Phone Number: 05/04/2023, 11:05 AM  Clinical Narrative:    CSW spoke with pt's daughter Jennette Kettle, she has chosen [facility] , for her mother's short term rehab placement. CSW will start insurance auth with updated PT note. TOC to follow.    Expected Discharge Plan: Skilled Nursing Facility Barriers to Discharge: Continued Medical Work up  Expected Discharge Plan and Services       Living arrangements for the past 2 months: Skilled Nursing Facility                                       Social Determinants of Health (SDOH) Interventions SDOH Screenings   Food Insecurity: No Food Insecurity (04/27/2023)  Housing: Low Risk  (04/27/2023)  Transportation Needs: No Transportation Needs (04/27/2023)  Utilities: Not At Risk (04/27/2023)  Social Connections: Unknown (11/26/2021)   Received from [facility]  Tobacco Use: Low Risk  (04/27/2023)    Readmission Risk Interventions     No data to display

## 2023-05-05 DIAGNOSIS — G9341 Metabolic encephalopathy: Secondary | ICD-10-CM | POA: Diagnosis not present

## 2023-05-05 LAB — CBC WITH DIFFERENTIAL/PLATELET
Abs Immature Granulocytes: 0.02 10*3/uL (ref 0.00–0.07)
Basophils Absolute: 0 10*3/uL (ref 0.0–0.1)
Basophils Relative: 1 %
Eosinophils Absolute: 0.2 10*3/uL (ref 0.0–0.5)
Eosinophils Relative: 3 %
HCT: 33.2 % — ABNORMAL LOW (ref 36.0–46.0)
Hemoglobin: 9.7 g/dL — ABNORMAL LOW (ref 12.0–15.0)
Immature Granulocytes: 0 %
Lymphocytes Relative: 13 %
Lymphs Abs: 0.7 10*3/uL (ref 0.7–4.0)
MCH: 27.1 pg (ref 26.0–34.0)
MCHC: 29.2 g/dL — ABNORMAL LOW (ref 30.0–36.0)
MCV: 92.7 fL (ref 80.0–100.0)
Monocytes Absolute: 0.4 10*3/uL (ref 0.1–1.0)
Monocytes Relative: 7 %
Neutro Abs: 4.1 10*3/uL (ref 1.7–7.7)
Neutrophils Relative %: 76 %
Platelets: 231 10*3/uL (ref 150–400)
RBC: 3.58 MIL/uL — ABNORMAL LOW (ref 3.87–5.11)
RDW: 15.5 % (ref 11.5–15.5)
WBC: 5.4 10*3/uL (ref 4.0–10.5)
nRBC: 0 % (ref 0.0–0.2)

## 2023-05-05 LAB — BASIC METABOLIC PANEL
Anion gap: 10 (ref 5–15)
BUN: 37 mg/dL — ABNORMAL HIGH (ref 8–23)
CO2: 32 mmol/L (ref 22–32)
Calcium: 8.7 mg/dL — ABNORMAL LOW (ref 8.9–10.3)
Chloride: 96 mmol/L — ABNORMAL LOW (ref 98–111)
Creatinine, Ser: 0.99 mg/dL (ref 0.44–1.00)
GFR, Estimated: 59 mL/min — ABNORMAL LOW (ref >60)
Glucose, Bld: 108 mg/dL — ABNORMAL HIGH (ref 70–99)
Potassium: 3.7 mmol/L (ref 3.5–5.1)
Sodium: 138 mmol/L (ref 135–145)

## 2023-05-05 LAB — MAGNESIUM: Magnesium: 2 mg/dL (ref 1.7–2.4)

## 2023-05-05 LAB — GLUCOSE, CAPILLARY
Glucose-Capillary: 116 mg/dL — ABNORMAL HIGH (ref 70–99)
Glucose-Capillary: 123 mg/dL — ABNORMAL HIGH (ref 70–99)
Glucose-Capillary: 130 mg/dL — ABNORMAL HIGH (ref 70–99)
Glucose-Capillary: 139 mg/dL — ABNORMAL HIGH (ref 70–99)
Glucose-Capillary: 157 mg/dL — ABNORMAL HIGH (ref 70–99)
Glucose-Capillary: 91 mg/dL (ref 70–99)
Glucose-Capillary: 95 mg/dL (ref 70–99)

## 2023-05-05 NOTE — Progress Notes (Signed)
Physical Therapy Treatment Patient Details Name: Hannah [name] MRN: [id number] DOB: 02-24-1946 Today's Date: 05/05/2023   History of Present Illness 77 yo female presented to ED on 04/27/2023 due to AMS with hallucinations. Pt was found to have B LE cellulitis and metabolic encephalopathy. Pt UA ahd head CT negative for acute findings, EKG reveals A-fib and incomplete R BBB. Pt PMH includes but is not limited to: A-fib, DM II, glaucoma, HTN, thyroid dz and multiple cardioversion.    PT Comments  The  patient demonstrates improved active LE ROM and tolerance. Patient stood x 3 at Rw, still unable to step sideways. Patient requesting pain medication prior to PT. Patient will benefit from continued inpatient follow up therapy, <3 hours/day     If plan is discharge home, recommend the following: Two people to help with walking and/or transfers;A lot of help with bathing/dressing/bathroom;Assistance with cooking/housework;Direct supervision/assist for medications management;Assist for transportation;Help with stairs or ramp for entrance   Can travel by private vehicle        Equipment Recommendations  Other (comment)    Recommendations for Other Services       Precautions / Restrictions Precautions Precautions: Fall Precaution Comments: Rt. leg pain, Restrictions Weight Bearing Restrictions: No     Mobility  Bed Mobility   Bed Mobility: Rolling, Sidelying to Sit, Sit to Sidelying Rolling: Mod assist Sidelying to sit: Mod assist, HOB elevated   Sit to supine: Max assist, +2 for physical assistance, Used rails, HOB elevated   General bed mobility comments: patient self assiting legs to bed edge, assiste with trunk, assist legs back onto bed    Transfers Overall transfer level: Needs assistance Equipment used: Rolling walker (2 wheels) Transfers: Sit to/from Stand Sit to Stand: Max assist, +2 physical assistance, From elevated surface           General transfer  comment: STS from elevated EOB, pt stood briefly x 3 at RW, reports incr. right leg pain, attempted to side step but unable.    Ambulation/Gait                   Stairs             Wheelchair Mobility     Tilt Bed    Modified Rankin (Stroke Patients Only)       Balance Overall balance assessment: Needs assistance, History of Falls Sitting-balance support: Feet supported, Bilateral upper extremity supported Sitting balance-Leahy Scale: Fair     Standing balance support: During functional activity, Bilateral upper extremity supported, Reliant on assistive device for balance Standing balance-Leahy Scale: Poor                              Cognition Arousal: Alert Behavior During Therapy: Anxious Overall Cognitive Status: Within Functional Limits for tasks assessed                                          Exercises      General Comments        Pertinent Vitals/Pain Pain Assessment Pain Score: 10-Worst pain ever Pain Location: RLE up to thigh Pain Descriptors / Indicators: Discomfort, Grimacing, Guarding Pain Intervention(s): Monitored during session, Patient requesting pain meds-RN notified    Home Living  Prior Function            PT Goals (current goals can now be found in the care plan section) Progress towards PT goals: Progressing toward goals    Frequency    Min 1X/week      PT Plan      Co-evaluation              AM-PAC PT "6 Clicks" Mobility   Outcome Measure  Help needed turning from your back to your side while in a flat bed without using bedrails?: A Lot Help needed moving from lying on your back to sitting on the side of a flat bed without using bedrails?: Total Help needed moving to and from a bed to a chair (including a wheelchair)?: Total Help needed standing up from a chair using your arms (e.g., wheelchair or bedside chair)?: Total Help needed to  walk in hospital room?: Total Help needed climbing 3-5 steps with a railing? : Total 6 Click Score: 7    End of Session Equipment Utilized During Treatment: Gait belt Activity Tolerance: Patient tolerated treatment well Patient left: in bed;with nursing/sitter in room;with bed alarm set Nurse Communication: Mobility status;Need for lift equipment PT Visit Diagnosis: Unsteadiness on feet (R26.81);Other abnormalities of gait and mobility (R26.89);Muscle weakness (generalized) (M62.81);History of falling (Z91.81);Difficulty in walking, not elsewhere classified (R26.2);Pain Pain - Right/Left: Right Pain - part of body: Hip;Leg;Ankle and joints of foot     Time: 1519-1601 PT Time Calculation (min) (ACUTE ONLY): 42 min  Charges:    $Therapeutic Activity: 38-52 mins PT General Charges $$ ACUTE PT VISIT: 1 Visit                     Blanchard Kelch PT Acute Rehabilitation Services Office [phone] Weekend pager-[phone]    Rada Hay 05/05/2023, 4:38 PM

## 2023-05-05 NOTE — Progress Notes (Signed)
Occupational Therapy Treatment Patient Details Name: Hannah [name] MRN: [id number] DOB: 1945/10/17 Today's Date: 05/05/2023   History of present illness 77 yr old female presented to ED on 04/27/2023 due to AMS with hallucinations. Pt was found to have B LE cellulitis and metabolic encephalopathy. Pt UA ahd head CT negative for acute findings, EKG reveals A-fib and incomplete R BBB. Pt PMH includes but is not limited to: A-fib, DM II, glaucoma, HTN, thyroid dz and multiple cardioversion.   OT comments  The pt recently worked with PT and reported increased R LE pain. As such, she was seen for therapy at bed level. She was instructed on bed level therapeutic exercise for strengthening needed to facilitate progressive ADL performance. She was further educated on rolling and the importance of repositioning at bed level. Continue OT plan of care. Patient will benefit from continued inpatient follow up therapy, <3 hours/day.       If plan is discharge home, recommend the following:  A lot of help with walking and/or transfers;A lot of help with bathing/dressing/bathroom;Assist for transportation;Help with stairs or ramp for entrance;Assistance with cooking/housework;Direct supervision/assist for medications management   Equipment Recommendations  Other (comment) (defer to next level of care)    Recommendations for Other Services      Precautions / Restrictions Precautions Precautions: Fall Precaution Comments: Rt. leg pain, Restrictions Weight Bearing Restrictions: No       Mobility Bed Mobility Overal bed mobility: Needs Assistance Bed Mobility: Rolling Rolling: Mod assist                     ADL either performed or assessed with clinical judgement   ADL Overall ADL's : Needs assistance/impaired Eating/Feeding: Independent;Sitting Eating/Feeding Details (indicate cue type and reason): based on clinical judgement Grooming: Set up Grooming Details (indicate cue type  and reason): simulated at bed level         Upper Body Dressing : Minimal assistance   Lower Body Dressing: Total assistance Lower Body Dressing Details (indicate cue type and reason): for sock management in bed     Toileting- Clothing Manipulation and Hygiene: Maximal assistance;Total assistance;Bed level                Cognition Arousal: Alert Behavior During Therapy: Anxious Overall Cognitive Status: Difficult to assess Area of Impairment: Attention      Following Commands: Follows one step commands consistently                           Pertinent Vitals/ Pain       Pain Assessment Pain Assessment: 0-10 Pain Score: 8  Pain Location: RLE up to thigh Pain Intervention(s): Limited activity within patient's tolerance, Monitored during session, Repositioned         Frequency  Min 1X/week        Progress Toward Goals  OT Goals(current goals can now be found in the care plan section)     Acute Rehab OT Goals OT Goal Formulation: With patient Time For Goal Achievement: 05/12/23 Potential to Achieve Goals: Fair  Plan         AM-PAC OT "6 Clicks" Daily Activity     Outcome Measure   Help from another person eating meals?: None Help from another person taking care of personal grooming?: A Little Help from another person toileting, which includes using toliet, bedpan, or urinal?: A Lot Help from another person bathing (including washing, rinsing, drying)?: A Lot Help from  another person to put on and taking off regular upper body clothing?: A Little Help from another person to put on and taking off regular lower body clothing?: Total 6 Click Score: 15    End of Session Equipment Utilized During Treatment: Other (comment) (N/A)  OT Visit Diagnosis: History of falling (Z91.81);Pain;Unsteadiness on feet (R26.81) Pain - Right/Left: Right Pain - part of body: Leg   Activity Tolerance Patient limited by pain   Patient Left in bed;with call  bell/phone within reach;with bed alarm set   Nurse Communication Mobility status        Time: 4098-1191 OT Time Calculation (min): 16 min  Charges: OT General Charges $OT Visit: 1 Visit OT Treatments $Therapeutic Activity: 8-22 mins    Reuben Likes, OTR/L 05/05/2023, 5:13 PM

## 2023-05-05 NOTE — Progress Notes (Addendum)
Patient Name: Hannah [name] Date of Encounter: 05/05/2023 [facility] Cardiologist: Will Jorja Loa, MD   Interval Summary  .    Denies any shortness of breath still has some peripheral edema but is improving.  Feels tired today  Vital Signs .    Vitals:   05/04/23 1920 05/04/23 2011 05/05/23 0430 05/05/23 0930  BP:  125/74 131/76 125/71  Pulse:  77 85 78  Resp: 20 20 20    Temp:  98.1 F (36.7 C) 98.4 F (36.9 C)   TempSrc:  Oral Oral   SpO2:  94% 96%   Weight:      Height:        Intake/Output Summary (Last 24 hours) at 05/05/2023 1054 Last data filed at 05/05/2023 0900 Gross per 24 hour  Intake 480 ml  Output 1850 ml  Net -1370 ml      05/03/2023    5:00 AM 05/02/2023    4:32 AM 05/01/2023    1:37 AM  Last 3 Weights  Weight (lbs) 289 lb 3.9 oz 283 lb 11.7 oz 285 lb 7.9 oz  Weight (kg) 131.2 kg 128.7 kg 129.5 kg      Telemetry/ECG    Atrial fibrillation heart rates in the 80s.  10 beats of NSVT.- Personally Reviewed  CV Studies    Echocardiogram 04/28/2023 1. Left ventricular ejection fraction, by estimation, is 55 to 60%. The  left ventricle has normal function. Left ventricular endocardial border  not optimally defined to evaluate regional wall motion. There is mild  concentric left ventricular  hypertrophy. Left ventricular diastolic parameters are indeterminate.  There was not sufficient IV access for Definity contrast imaging.   2. Right ventricular systolic function is normal. The right ventricular  size is not well visualized. There is moderately elevated pulmonary artery  systolic pressure. The estimated right ventricular systolic pressure is  54.9 mmHg.   3. Left atrial size was moderately dilated.   4. The mitral valve is grossly normal. Moderate mitral valve  regurgitation. No evidence of mitral stenosis.   5. Tricuspid valve regurgitation is moderate to severe.   6. The aortic valve is tricuspid. Aortic valve  regurgitation is not  visualized. Aortic valve sclerosis is present, with no evidence of aortic  valve stenosis.   7. The inferior vena cava is dilated in size with <50% respiratory  variability, suggesting right atrial pressure of 15 mmHg.   Comparison(s): Prior images reviewed side by side. MR has improved from  prior study, TR may be worse; difficult comparison to 2021 TEE.   Physical Exam .   GEN: No acute distress.   Neck: No JVD Cardiac: Irregular irregular, 2/6 murmur Respiratory: Clear to auscultation bilaterally. GI: Soft, nontender, non-distended  MS: 2-3+ pitting edema, improving.  Bilateral stasis wounds  Assessment & Plan .     Acute on chronic HFpEF Chronic venous insufficiency Seems to be progressing decently well on IV Lasix 80 mg twice daily.  Weight on admission 307 pounds.  Has been downtrending since then.  No weights for the past 2 days.  Diuresed 2.3 L in the last 24 hours.  Still has significant peripheral edema not sure how much of this is due to her baseline venous insufficiency versus heart failure.  Generally been doing well and improving, will continue same diuresis strategy.   Peripheral edema renal function continue to improve.  Continue IV Lasix 80 mg twice daily and spironolactone 12.5 mg.  Can resume Jardiance once after diuresis. Echo  shows EF 55 to 60% with normal RV function.  Moderately dilated LA. RVSP 54.9.  Longstanding persistent atrial fibrillation NSVT Had ablation in 2021 and was on Tikosyn in July 2022.  She was lost to follow-up last couple years and had not been refilling her Tikosyn or her Eliquis all of this year.  She is rate controlled currently with heart rates in the 80s. Continue Eliquis 5 mg twice daily She has rare occurrences of NSVT.  Maintain potassium greater than 4 magnesium greater than 2.  Moderate MR Moderate to severe TR Likely due to volume status.  CKD Creatinine on admission 1.3 and has progressively improved  with diuresis.  Currently .99  Hyperlipidemia Continue rosuvastatin 5 mg daily  OSA on CPAP Lower extremity cellulitis Delirium with hallucinations appears to have resolved type 2 diabetes Deconditioning Per primary team.  For questions or updates, please contact [facility] Please consult [email or web address] for contact info under        Signed, Abagail Kitchens, PA-C  I have seen and examined the patient along with Abagail Kitchens, PA-C.  I have reviewed the chart, notes and new data.  I agree with PA/NP's note.  Key new complaints: continues to diurese well, weight down to 125.9 kg (10 kg less in one week) Key examination changes: edema continues to improve and today for the first time there appears to be no fluid drainage on the left pretibial dressing Key new findings / data: improving renal parameters  PLAN: I think we may be able to switch to PO diuretics tomorrow. Presented her with the strong suspicion that her untreated OSA is the reason for right heart failure and worsened fluid retention. She has the CPAP equipment at home but has not used it in years. Unless OSA is treated, right heart failure will continue to worsen. Diuretics are just symptomatic therapy. Maybe we can get her started back on CPAP while she is here?  Thurmon Fair, MD, [facility] [phone] 05/05/2023, 12:31 PM

## 2023-05-05 NOTE — Progress Notes (Signed)
PROGRESS NOTE    Hannah [name]  XTG:[id number] DOB: Jul 30, 1945 DOA: 04/27/2023 PCP: Darrow Bussing, MD   Brief Narrative:  77 y.o. female with a history of atrial fibrillation, diabetes mellitus type 2, hypertension, diastolic heart failure, morbid obesity presented with a fall and increased leg swelling and was found to have evidence of bilateral leg cellulitis complicated by chronic leg wound.  Started on IV Rocephin.  Cardiology was consulted.  She is currently on IV Lasix.  PT recommended SNF placement.  Assessment & Plan:   Bilateral lower extremity cellulitis -With associated right anterior leg wound.  Wound care as per wound care RN recommendations -CT of both legs confirm cellulitis without evidence of underlying abscesses.  Has received Rocephin for 7 days.  Cellulitis has much improved.  DC'd antibiotics on 05/04/2023.  Acute on chronic diastolic heart failure -Likely secondary to medication nonadherence.  Strict input and output.  Daily weights.  Fluid restriction.  Negative balance of 8530 cc since admission -Cardiology following: Remains on IV Lasix 80 mg twice a day currently.  Also on spironolactone.  Echo showed EF of 55 to 60% with moderate mitral valve regurgitation and moderate to severe tricuspid valve regurgitation  Acute metabolic encephalopathy/delirium -Likely related to above.  CT head unremarkable for acute process.  Symptoms appear to have resolved.  Still slow to respond and a poor historian; might have underlying undiagnosed dementia  Persistent A-fib -Apparently patient missed several doses of Tikosyn as an outpatient.  Cardiology following: Recommending not to restart Tikosyn.  Continue Eliquis.  Currently rate controlled  CKD stage IIIb -Creatinine currently stable.  Monitor  Acute metabolic acidosis -Improved.  Monitor.  Anemia of chronic disease -From renal failure.  Hemoglobin currently stable.  Monitor.  Diabetes mellitus type 2 -A1c 6.2.   She is on Jardiance as an outpatient.  Carb modified diet.  Hypothyroidism -Continue levothyroxine  Hyperlipidemia -Continue statin  Morbid obesity -Outpatient follow-up  Anxiety/depression -Continue buspirone and duloxetine  Physical deconditioning -PT recommends SNF placement.  TOC following   DVT prophylaxis: Eliquis Code Status: Full Family Communication: None at bedside Disposition Plan: Status is: Inpatient Remains inpatient appropriate because: Of severity of illness.  Possible discharge in 1 to 2 days if remains stable and cleared by cardiology    Consultants: Cardiology  Procedures: 2D echo  Antimicrobials: Rocephin from 04/27/2023 onwards   Subjective: Patient seen and examined at bedside.  Poor historian.  Denies worsening shortness of breath, chest pain or fever.   Objective: Vitals:   05/04/23 1835 05/04/23 1920 05/04/23 2011 05/05/23 0430  BP:   125/74 131/76  Pulse:   77 85  Resp: 19 20 20 20   Temp:   98.1 F (36.7 C) 98.4 F (36.9 C)  TempSrc:   Oral Oral  SpO2:   94% 96%  Weight:      Height:        Intake/Output Summary (Last 24 hours) at 05/05/2023 2703 Last data filed at 05/04/2023 2020 Gross per 24 hour  Intake 480 ml  Output 1850 ml  Net -1370 ml   Filed Weights   05/01/23 0137 05/02/23 0432 05/03/23 0500  Weight: 129.5 kg 128.7 kg 131.2 kg    Examination:  General: Currently on room air.  No distress.  Looks chronically ill and deconditioned. ENT/neck: No obvious neck masses or JVD elevation noted  respiratory: Bilateral decreased breath sounds at bases with scattered crackles  CVS: Rate currently controlled; S1 and S2 are heard  abdominal: Soft, morbidly obese,  nontender; has some distention; no organomegaly, normal bowel sounds heard Extremities: No clubbing; bilateral lower extremity edema present CNS: Alert; poor historian.  Still slow to respond.  No obvious focal deficits noted  lymph: No palpable  lymphadenopathy skin: Bilateral lower extremity chronic skin changes dressing on right anterior shin.  Erythema has much improved  psych: Mostly flat affect.  Showing no signs of agitation. Musculoskeletal: No obvious joint tenderness/swelling  Data Reviewed: I have personally reviewed following labs and imaging studies  CBC: Recent Labs  Lab 04/29/23 0404 05/05/23 0443  WBC 7.3 5.4  NEUTROABS  --  4.1  HGB 9.5* 9.7*  HCT 33.2* 33.2*  MCV 96.5 92.7  PLT 194 231   Basic Metabolic Panel: Recent Labs  Lab 05/01/23 0346 05/02/23 0351 05/03/23 0409 05/04/23 0340 05/05/23 0443  NA 139 140 141 139 138  K 3.7 4.1 3.9 3.8 3.7  CL 106 102 101 100 96*  CO2 24 28 30 29  32  GLUCOSE 109* 113* 115* 125* 108*  BUN 39* 37* 38* 38* 37*  CREATININE 1.07* 1.05* 1.07* 1.04* 0.99  CALCIUM 8.1* 8.6* 8.7* 8.8* 8.7*  MG 2.1 2.0 2.0 2.2 2.0   GFR: Estimated Creatinine Clearance: 67.2 mL/min (by C-G formula based on SCr of 0.99 mg/dL). Liver Function Tests: No results for input(s): "AST", "ALT", "ALKPHOS", "BILITOT", "PROT", "ALBUMIN" in the last 168 hours.  No results for input(s): "LIPASE", "AMYLASE" in the last 168 hours. No results for input(s): "AMMONIA" in the last 168 hours.  Coagulation Profile: No results for input(s): "INR", "PROTIME" in the last 168 hours. Cardiac Enzymes: No results for input(s): "CKTOTAL", "CKMB", "CKMBINDEX", "TROPONINI" in the last 168 hours. BNP (last 3 results) No results for input(s): "PROBNP" in the last 8760 hours. HbA1C: No results for input(s): "HGBA1C" in the last 72 hours.  CBG: Recent Labs  Lab 05/04/23 1604 05/04/23 2008 05/05/23 0024 05/05/23 0430 05/05/23 0805  GLUCAP 121* 105* 116* 91 95   Lipid Profile: No results for input(s): "CHOL", "HDL", "LDLCALC", "TRIG", "CHOLHDL", "LDLDIRECT" in the last 72 hours.  Thyroid Function Tests: No results for input(s): "TSH", "T4TOTAL", "FREET4", "T3FREE", "THYROIDAB" in the last 72  hours. Anemia Panel: No results for input(s): "VITAMINB12", "FOLATE", "FERRITIN", "TIBC", "IRON", "RETICCTPCT" in the last 72 hours. Sepsis Labs: No results for input(s): "PROCALCITON", "LATICACIDVEN" in the last 168 hours.   Recent Results (from the past 240 hour(s))  Blood culture (routine x 2)     Status: None   Collection Time: 04/27/23  4:31 PM   Specimen: BLOOD RIGHT HAND  Result Value Ref Range Status   Specimen Description   Final    BLOOD RIGHT HAND Performed at [facility], [address] [place], [place] [address]    Special Requests   Final    BOTTLES DRAWN AEROBIC AND ANAEROBIC Blood Culture adequate volume Performed at [facility], [address] [place], [place] [address]    Culture   Final    NO GROWTH 5 DAYS Performed at [facility], [address], [place], [place] [address]    Report Status 05/02/2023 FINAL  Final  Blood culture (routine x 2)     Status: None   Collection Time: 04/27/23  4:42 PM   Specimen: BLOOD  Result Value Ref Range Status   Specimen Description   Final    BLOOD RIGHT ANTECUBITAL Performed at [facility], [address], [place], [place] [address]    Special  Requests   Final    BOTTLES DRAWN AEROBIC AND ANAEROBIC Blood Culture adequate volume Performed at [facility], [address], [place], [place] [address]    Culture   Final    NO GROWTH 5 DAYS Performed at [facility], [address], [place], [place] [address]    Report Status 05/02/2023 FINAL  Final  MRSA Next Gen by PCR, Nasal     Status: Abnormal   Collection Time: 04/28/23  9:06 AM   Specimen: Nasal Mucosa; Nasal Swab  Result Value Ref Range Status   MRSA by PCR Next Gen DETECTED (A) NOT DETECTED Final    Comment: RESULT CALLED TO, READ BACK BY AND VERIFIED WITH: FOUNDER, W.  RN AT 1202 ON 1203 ON 04/28/2023 BY MECIAL J. (NOTE) The GeneXpert MRSA Assay (FDA approved for NASAL specimens  only), is one component of a comprehensive MRSA colonization surveillance program. It is not intended to diagnose MRSA infection nor to guide or monitor treatment for MRSA infections. Test performance is not FDA approved in patients less than 71 years old. Performed at [facility], [address], [place], [place] [address]          Radiology Studies: No results found.      Scheduled Meds:  apixaban  5 mg Oral BID   busPIRone  15 mg Oral TID   DULoxetine  90 mg Oral Daily   ferrous sulfate  325 mg Oral BID WC   furosemide  80 mg Intravenous BID   insulin aspart  0-9 Units Subcutaneous Q4H   levothyroxine  125 mcg Oral Q0600   lidocaine  1 patch Transdermal Q24H   melatonin  5 mg Oral QHS   montelukast  10 mg Oral QHS   rosuvastatin  5 mg Oral Daily   sodium chloride flush  3 mL Intravenous Q12H   spironolactone  12.5 mg Oral Daily   Continuous Infusions:          Glade Lloyd, MD [facility] 05/05/2023, 8:22 AM  '

## 2023-05-06 DIAGNOSIS — E7800 Pure hypercholesterolemia, unspecified: Secondary | ICD-10-CM

## 2023-05-06 DIAGNOSIS — E1122 Type 2 diabetes mellitus with diabetic chronic kidney disease: Secondary | ICD-10-CM | POA: Diagnosis not present

## 2023-05-06 DIAGNOSIS — G9341 Metabolic encephalopathy: Secondary | ICD-10-CM | POA: Diagnosis not present

## 2023-05-06 DIAGNOSIS — I10 Essential (primary) hypertension: Secondary | ICD-10-CM

## 2023-05-06 DIAGNOSIS — L03119 Cellulitis of unspecified part of limb: Secondary | ICD-10-CM

## 2023-05-06 DIAGNOSIS — R600 Localized edema: Secondary | ICD-10-CM | POA: Diagnosis not present

## 2023-05-06 LAB — BASIC METABOLIC PANEL
Anion gap: 10 (ref 5–15)
BUN: 34 mg/dL — ABNORMAL HIGH (ref 8–23)
CO2: 35 mmol/L — ABNORMAL HIGH (ref 22–32)
Calcium: 8.9 mg/dL (ref 8.9–10.3)
Chloride: 94 mmol/L — ABNORMAL LOW (ref 98–111)
Creatinine, Ser: 1.04 mg/dL — ABNORMAL HIGH (ref 0.44–1.00)
GFR, Estimated: 56 mL/min — ABNORMAL LOW (ref >60)
Glucose, Bld: 121 mg/dL — ABNORMAL HIGH (ref 70–99)
Potassium: 4.4 mmol/L (ref 3.5–5.1)
Sodium: 139 mmol/L (ref 135–145)

## 2023-05-06 LAB — GLUCOSE, CAPILLARY
Glucose-Capillary: 101 mg/dL — ABNORMAL HIGH (ref 70–99)
Glucose-Capillary: 106 mg/dL — ABNORMAL HIGH (ref 70–99)
Glucose-Capillary: 121 mg/dL — ABNORMAL HIGH (ref 70–99)
Glucose-Capillary: 136 mg/dL — ABNORMAL HIGH (ref 70–99)
Glucose-Capillary: 140 mg/dL — ABNORMAL HIGH (ref 70–99)
Glucose-Capillary: 150 mg/dL — ABNORMAL HIGH (ref 70–99)

## 2023-05-06 LAB — MAGNESIUM: Magnesium: 2 mg/dL (ref 1.7–2.4)

## 2023-05-06 MED ORDER — FUROSEMIDE 40 MG PO TABS
80.0000 mg | ORAL_TABLET | Freq: Two times a day (BID) | ORAL | Status: DC
  Administered 2023-05-06 – 2023-05-08 (×4): 80 mg via ORAL
  Filled 2023-05-06 (×4): qty 2

## 2023-05-06 MED ORDER — FUROSEMIDE 40 MG PO TABS: 80.0000 mg | ORAL_TABLET | Freq: Every day | ORAL | Status: DC

## 2023-05-06 NOTE — Plan of Care (Signed)

## 2023-05-06 NOTE — TOC Progression Note (Incomplete)
Transition of Care [facility]) - Progression Note    Patient Details  Name: Crispina Sheffield MRN: [id number] Date of Birth: 16-Aug-1945  Transition of Care [facility]) CM/SW Contact  Larrie Kass, LCSW Phone Number: 05/06/2023, 10:02 AM  Clinical Narrative:    Pt's PASRR was assigned, [id number] H.    Expected Discharge Plan: Skilled Nursing Facility Barriers to Discharge: Continued Medical Work up  Expected Discharge Plan and Services       Living arrangements for the past 2 months: Skilled Nursing Facility                                       Social Determinants of Health (SDOH) Interventions SDOH Screenings   Food Insecurity: No Food Insecurity (04/27/2023)  Housing: Low Risk  (04/27/2023)  Transportation Needs: No Transportation Needs (04/27/2023)  Utilities: Not At Risk (04/27/2023)  Social Connections: Unknown (11/26/2021)   Received from [facility]  Tobacco Use: Low Risk  (04/27/2023)    Readmission Risk Interventions     No data to display

## 2023-05-06 NOTE — Plan of Care (Addendum)
  Problem: Skin Integrity: Goal: Risk for impaired skin integrity will decrease Outcome: Progressing   Problem: Nutrition: Goal: Adequate nutrition will be maintained Outcome: Progressing   

## 2023-05-06 NOTE — Progress Notes (Signed)
   05/06/23 2027  BiPAP/CPAP/SIPAP  Reason BIPAP/CPAP not in use Non-compliant (Pt refused not using consistently at home. Last time used about 6 months ago)  BiPAP/CPAP /SiPAP Vitals  Temp 98.7 F (37.1 C)  Pulse Rate 72  Resp (!) 25  BP 123/67  SpO2 94 %  MEWS Score/Color  MEWS Score 1  MEWS Score Color Green

## 2023-05-06 NOTE — Progress Notes (Signed)
PROGRESS NOTE    Hannah [name]  IZT:[id number] DOB: 06-Apr-1946 DOA: 04/27/2023 PCP: Darrow Bussing, MD    Chief Complaint  Patient presents with   Fall   Leg Swelling    Brief Narrative:  77 y.o. female with a history of atrial fibrillation, diabetes mellitus type 2, hypertension, diastolic heart failure, morbid obesity presented with a fall and increased leg swelling and was found to have evidence of bilateral leg cellulitis complicated by chronic leg wound. Started on IV Rocephin. Cardiology was consulted. She is currently on IV Lasix. PT recommended SNF placement.    Assessment & Plan:   Principal Problem:   Acute metabolic encephalopathy Active Problems:   Cellulitis   Acute on chronic diastolic heart failure (HCC)   Pressure injury of skin   DMII (diabetes mellitus, type 2) (HCC)   Persistent atrial fibrillation (HCC)   Chronic venous insufficiency of lower extremity   Acute on chronic diastolic CHF (congestive heart failure) (HCC)   Pure hypercholesterolemia   History of noncompliance with medical treatment   Primary hypertension   Bilateral lower extremity edema   Cor pulmonale, chronic (HCC)   Pulmonary artery hypertension (HCC)   Obstructive sleep apnea   Morbid obesity (HCC)  #1 bilateral lower extremity cellulitis/associated right anterior leg wound -Patient underwent CT both lower extremities which was negative for underlying abscess. -Clinical improvement. -Status post 7 days IV Rocephin. -Antibiotics discontinued 05/04/2023.  2.  Acute on chronic diastolic CHF exacerbation -Likely secondary to medication nonadherence in the setting of probable OSA and nonadherence to CPAP. -2D echo obtained with a EF of 55 to 60%, mild concentric LVH, normal right systolic function, moderately dilated left atrial size, moderate MVR. -Patient seen in consultation by cardiology currently on IV Lasix with urine output of 2 L over the past 24 hours. -Patient is -12.425  L during this hospitalization. -Current weight of 279.1 pounds from 306 pounds on admission. -Currently on IV Lasix and to be transitioned to oral Lasix today per cardiology. -Continue spironolactone. -Cardiology recommending that patient start using CPAP consistently for long-term success in managing her edema/right heart failure. -Appreciate cardiology input and recommendations.  3.  Acute metabolic encephalopathy/delirium -Likely secondary to problems #1, 2. -CT head negative for any acute abnormalities. -Improved clinically likely close to baseline. -Outpatient follow-up.  4.  Persistent A-fib -Patient noted to have apparently missed several doses of Tikosyn as outpatient. -Currently rate controlled. -Eliquis for anticoagulation. -Cardiology recommended not to restart Tikosyn. -Outpatient follow-up.  5.  CKD stage IIIb -Stable.  6.  Anemia of chronic disease -H&H stable.  7.  Diabetes mellitus type 2 -Hemoglobin A1c 6.2 (04/28/2023) -CBG 136 this morning. -Continue SSI.  8.  Hypothyroidism -Continue Synthroid.  9.  Hyperlipidemia -Statin.  10.  Depression/anxiety -Continue buspirone and duloxetine.  11.  Morbid obesity -Lifestyle modification -Outpatient follow-up with PCP.  12.  Physical deconditioning -Seen by PT/OT who are recommending SNF placement.  13.  Metabolic acidosis -Resolved.  14.  OSA -Needs to be compliant with CPAP. -Place on CPAP while in-house.          DVT prophylaxis: Eliquis Code Status: Full Family Communication: Updated patient.  No family at bedside. Disposition: SNF  Status is: Inpatient Remains inpatient appropriate because: Severity of illness   Consultants:  Cardiology: Dr. Mayford Knife 04/29/2023  Procedures:  CT bilateral tib-fib 04/27/2023 CT head 04/27/2023 Chest x-ray 04/27/2023 2D echo 04/28/2023 Bilateral lower extremity Dopplers 05/01/2023  Antimicrobials:  Anti-infectives (From admission, onward)  Start     Dose/Rate Route Frequency Ordered Stop   04/28/23 1000  cefTRIAXone (ROCEPHIN) 2 g in sodium chloride 0.9 % 100 mL IVPB        2 g 200 mL/hr over 30 Minutes Intravenous Every 24 hours 04/27/23 2058 05/04/23 1033   04/27/23 1730  cefTRIAXone (ROCEPHIN) 1 g in sodium chloride 0.9 % 100 mL IVPB        1 g 200 mL/hr over 30 Minutes Intravenous  Once 04/27/23 1719 04/27/23 1825         Subjective: Patient laying in bed.  States shortness of breath has improved significantly.  Preoccupied with dressing change on left shin not done yet.  Denies any chest pain.  No abdominal pain.  States has good urine output.    Objective: Vitals:   05/05/23 1258 05/05/23 2033 05/06/23 0406 05/06/23 1143  BP:  136/67 114/74 115/60  Pulse:  69 76 82  Resp:  20 20 20   Temp:  98.4 F (36.9 C) 98.7 F (37.1 C) 98.5 F (36.9 C)  TempSrc:  Oral Oral Oral  SpO2:  95% 94% 93%  Weight: 126.6 kg     Height:        Intake/Output Summary (Last 24 hours) at 05/06/2023 1430 Last data filed at 05/06/2023 1415 Gross per 24 hour  Intake 600 ml  Output 3075 ml  Net -2475 ml   Filed Weights   05/02/23 0432 05/03/23 0500 05/05/23 1258  Weight: 128.7 kg 131.2 kg 126.6 kg    Examination:  General exam: Appears calm and comfortable  Respiratory system: Clear to auscultation.  No wheezes, no crackles, no rhonchi.  Fair air movement.  Speaking in full sentences.  Respiratory effort normal. Cardiovascular system: Irregularly irregular. No JVD, murmurs, rubs, gallops or clicks.  Trace bilateral lower extremity edema.  Gastrointestinal system: Abdomen is nondistended, soft and nontender. No organomegaly or masses felt. Normal bowel sounds heard. Central nervous system: Alert and oriented. No focal neurological deficits. Extremities: Bilateral venous stasis changes noted.  Mepilex on bilateral shins.  No warmth, no significant tenderness to palpation.  Trace bilateral lower extremity edema.   Skin: No  rashes, lesions or ulcers Psychiatry: Judgement and insight appear normal. Mood & affect appropriate.     Data Reviewed: I have personally reviewed following labs and imaging studies  CBC: Recent Labs  Lab 05/05/23 0443  WBC 5.4  NEUTROABS 4.1  HGB 9.7*  HCT 33.2*  MCV 92.7  PLT 231    Basic Metabolic Panel: Recent Labs  Lab 05/02/23 0351 05/03/23 0409 05/04/23 0340 05/05/23 0443 05/06/23 0419  NA 140 141 139 138 139  K 4.1 3.9 3.8 3.7 4.4  CL 102 101 100 96* 94*  CO2 28 30 29  32 35*  GLUCOSE 113* 115* 125* 108* 121*  BUN 37* 38* 38* 37* 34*  CREATININE 1.05* 1.07* 1.04* 0.99 1.04*  CALCIUM 8.6* 8.7* 8.8* 8.7* 8.9  MG 2.0 2.0 2.2 2.0 2.0    GFR: Estimated Creatinine Clearance: 62.6 mL/min (A) (by C-G formula based on SCr of 1.04 mg/dL (H)).  Liver Function Tests: No results for input(s): "AST", "ALT", "ALKPHOS", "BILITOT", "PROT", "ALBUMIN" in the last 168 hours.  CBG: Recent Labs  Lab 05/05/23 2038 05/05/23 2334 05/06/23 0404 05/06/23 0740 05/06/23 1117  GLUCAP 139* 123* 106* 136* 101*     Recent Results (from the past 240 hour(s))  Blood culture (routine x 2)     Status: None   Collection Time: 04/27/23  4:31 PM   Specimen: BLOOD RIGHT HAND  Result Value Ref Range Status   Specimen Description   Final    BLOOD RIGHT HAND Performed at [facility], [address] [place], [place] [address]    Special Requests   Final    BOTTLES DRAWN AEROBIC AND ANAEROBIC Blood Culture adequate volume Performed at [facility], [address], [place], [place] [address]    Culture   Final    NO GROWTH 5 DAYS Performed at [facility], [address] [place], [place] [address]    Report Status 05/02/2023 FINAL  Final  Blood culture (routine x 2)     Status: None   Collection Time: 04/27/23  4:42 PM   Specimen: BLOOD  Result Value Ref Range Status   Specimen Description   Final    BLOOD RIGHT ANTECUBITAL Performed at  [facility], [address], [place], [place] [address]    Special Requests   Final    BOTTLES DRAWN AEROBIC AND ANAEROBIC Blood Culture adequate volume Performed at [facility], [address] [place], [place] [address]    Culture   Final    NO GROWTH 5 DAYS Performed at [facility], [address], [place], [place] [address]    Report Status 05/02/2023 FINAL  Final  MRSA Next Gen by PCR, Nasal     Status: Abnormal   Collection Time: 04/28/23  9:06 AM   Specimen: Nasal Mucosa; Nasal Swab  Result Value Ref Range Status   MRSA by PCR Next Gen DETECTED (A) NOT DETECTED Final    Comment: RESULT CALLED TO, READ BACK BY AND VERIFIED WITH: FOUNDER, W.  RN AT 1202 ON 1203 ON 04/28/2023 BY MECIAL J. (NOTE) The GeneXpert MRSA Assay (FDA approved for NASAL specimens only), is one component of a comprehensive MRSA colonization surveillance program. It is not intended to diagnose MRSA infection nor to guide or monitor treatment for MRSA infections. Test performance is not FDA approved in patients less than 56 years old. Performed at [facility], [address] [place], [place] [address]          Radiology Studies: No results found.      Scheduled Meds:  apixaban  5 mg Oral BID   busPIRone  15 mg Oral TID   DULoxetine  90 mg Oral Daily   ferrous sulfate  325 mg Oral BID WC   furosemide  80 mg Intravenous BID   insulin aspart  0-9 Units Subcutaneous Q4H   levothyroxine  125 mcg Oral Q0600   lidocaine  1 patch Transdermal Q24H   melatonin  5 mg Oral QHS   montelukast  10 mg Oral QHS   rosuvastatin  5 mg Oral Daily   sodium chloride flush  3 mL Intravenous Q12H   spironolactone  12.5 mg Oral Daily   Continuous Infusions:   LOS: 8 days    Time spent: 35 mins    Ramiro Harvest, MD [facility]   To contact the attending provider between 7A-7P or the covering provider during after hours 7P-7A,  please log into the web site [email or web address] and access using universal [place] password for that web site. If you do not have the password, please call the hospital operator.  05/06/2023, 2:30 PM

## 2023-05-06 NOTE — Progress Notes (Signed)
   Patient Name: Denise [name] Date of Encounter: 05/06/2023 [facility] Cardiologist: Will Jorja Loa, MD   Interval Summary  .    No dyspnea.  Dressings on both legs  look dry to me, although the patient reports that the nurse told her there was still some weeping from the left shin.  Edema substantially improved. Weight is now down to under 280 pounds, which I think is a reasonable approximation of her "dry weight.  Vital Signs .    Vitals:   05/05/23 1258 05/05/23 2033 05/06/23 0406 05/06/23 1143  BP:  136/67 114/74 115/60  Pulse:  69 76 82  Resp:  20 20 20   Temp:  98.4 F (36.9 C) 98.7 F (37.1 C) 98.5 F (36.9 C)  TempSrc:  Oral Oral Oral  SpO2:  95% 94% 93%  Weight: 126.6 kg     Height:        Intake/Output Summary (Last 24 hours) at 05/06/2023 1518 Last data filed at 05/06/2023 1415 Gross per 24 hour  Intake 600 ml  Output 3075 ml  Net -2475 ml      05/05/2023   12:58 PM 05/03/2023    5:00 AM 05/02/2023    4:32 AM  Last 3 Weights  Weight (lbs) 279 lb 1.6 oz 289 lb 3.9 oz 283 lb 11.7 oz  Weight (kg) 126.6 kg 131.2 kg 128.7 kg      Telemetry/ECG    Atrial fibrillation, rate controlled- Personally Reviewed  Physical Exam .   GEN: No acute distress.   Neck: No JVD Cardiac: Irregular, no murmurs, rubs, or gallops.  Respiratory: Clear to auscultation bilaterally. GI: Soft, nontender, non-distended  MS: Chronic trophic skin changes due to brawny edema, but with a lot of wrinkles and substantial softening of the skin.  Clean dressings on both shins.  Assessment & Plan .     Switch to oral diuretics.  If weight remains stable, likely ready for transition to skilled nursing facility for rehab tomorrow afternoon. For long-term success in managing her edema/right heart failure it is critical for her to start using CPAP consistently.   For questions or updates, please contact [facility] Please consult [email or web address] for contact  info under        Signed, Thurmon Fair, MD

## 2023-05-07 DIAGNOSIS — G9341 Metabolic encephalopathy: Secondary | ICD-10-CM | POA: Diagnosis not present

## 2023-05-07 DIAGNOSIS — E1122 Type 2 diabetes mellitus with diabetic chronic kidney disease: Secondary | ICD-10-CM | POA: Diagnosis not present

## 2023-05-07 DIAGNOSIS — R600 Localized edema: Secondary | ICD-10-CM | POA: Diagnosis not present

## 2023-05-07 LAB — BASIC METABOLIC PANEL
Anion gap: 10 (ref 5–15)
BUN: 31 mg/dL — ABNORMAL HIGH (ref 8–23)
CO2: 32 mmol/L (ref 22–32)
Calcium: 8.7 mg/dL — ABNORMAL LOW (ref 8.9–10.3)
Chloride: 96 mmol/L — ABNORMAL LOW (ref 98–111)
Creatinine, Ser: 0.94 mg/dL (ref 0.44–1.00)
GFR, Estimated: >60 mL/min (ref >60)
Glucose, Bld: 105 mg/dL — ABNORMAL HIGH (ref 70–99)
Potassium: 4.4 mmol/L (ref 3.5–5.1)
Sodium: 138 mmol/L (ref 135–145)

## 2023-05-07 LAB — CBC
HCT: 32.1 % — ABNORMAL LOW (ref 36.0–46.0)
Hemoglobin: 9.4 g/dL — ABNORMAL LOW (ref 12.0–15.0)
MCH: 27.4 pg (ref 26.0–34.0)
MCHC: 29.3 g/dL — ABNORMAL LOW (ref 30.0–36.0)
MCV: 93.6 fL (ref 80.0–100.0)
Platelets: 243 10*3/uL (ref 150–400)
RBC: 3.43 MIL/uL — ABNORMAL LOW (ref 3.87–5.11)
RDW: 15.8 % — ABNORMAL HIGH (ref 11.5–15.5)
WBC: 5.7 10*3/uL (ref 4.0–10.5)
nRBC: 0 % (ref 0.0–0.2)

## 2023-05-07 LAB — GLUCOSE, CAPILLARY
Glucose-Capillary: 131 mg/dL — ABNORMAL HIGH (ref 70–99)
Glucose-Capillary: 104 mg/dL — ABNORMAL HIGH (ref 70–99)
Glucose-Capillary: 177 mg/dL — ABNORMAL HIGH (ref 70–99)
Glucose-Capillary: 117 mg/dL — ABNORMAL HIGH (ref 70–99)
Glucose-Capillary: 141 mg/dL — ABNORMAL HIGH (ref 70–99)

## 2023-05-07 LAB — MAGNESIUM: Magnesium: 2 mg/dL (ref 1.7–2.4)

## 2023-05-07 MED ORDER — MORPHINE SULFATE (PF) 2 MG/ML IV SOLN
2.0000 mg | Freq: Once | INTRAVENOUS | Status: AC
  Administered 2023-05-07: 2 mg via INTRAVENOUS
  Filled 2023-05-07: qty 1

## 2023-05-07 MED ORDER — EMPAGLIFLOZIN 10 MG PO TABS
10.0000 mg | ORAL_TABLET | Freq: Every day | ORAL | Status: DC
  Administered 2023-05-08: 10 mg via ORAL
  Filled 2023-05-07: qty 1

## 2023-05-07 NOTE — TOC Progression Note (Signed)
Transition of Care [facility]) - Progression Note    Patient Details  Name: Hannah [name] MRN: [id number] Date of Birth: 1945/12/05  [facility] CM/SW Contact  Larrie Kass, LCSW Phone Number: 05/07/2023, 10:18 AM  Clinical Narrative:    Pt's PASRR was assigned, [id number] H.   Pt's insurance [employer] was approved Auth ID [id number] from 05/07/23-05/09/23. TOC to follow.    Expected Discharge Plan: Skilled Nursing Facility Barriers to Discharge: Continued Medical Work up  Expected Discharge Plan and Services       Living arrangements for the past 2 months: Skilled Nursing Facility                                       Social Determinants of Health (SDOH) Interventions SDOH Screenings   Food Insecurity: No Food Insecurity (04/27/2023)  Housing: Low Risk  (04/27/2023)  Transportation Needs: No Transportation Needs (04/27/2023)  Utilities: Not At Risk (04/27/2023)  Social Connections: Unknown (11/26/2021)   Received from [facility], [facility]  Tobacco Use: Low Risk  (04/27/2023)    Readmission Risk Interventions     No data to display

## 2023-05-07 NOTE — Progress Notes (Signed)
PROGRESS NOTE    Hannah [name]  VWU:[id number] DOB: 1946-03-31 DOA: 04/27/2023 PCP: Darrow Bussing, MD    Chief Complaint  Patient presents with   Fall   Leg Swelling    Brief Narrative:  77 y.o. female with a history of atrial fibrillation, diabetes mellitus type 2, hypertension, diastolic heart failure, morbid obesity presented with a fall and increased leg swelling and was found to have evidence of bilateral leg cellulitis complicated by chronic leg wound. Started on IV Rocephin. Cardiology was consulted. She is currently on IV Lasix. PT recommended SNF placement.    Assessment & Plan:   Principal Problem:   Acute metabolic encephalopathy Active Problems:   Cellulitis   Acute on chronic diastolic heart failure (HCC)   Pressure injury of skin   DMII (diabetes mellitus, type 2) (HCC)   Persistent atrial fibrillation (HCC)   Chronic venous insufficiency of lower extremity   Acute on chronic diastolic CHF (congestive heart failure) (HCC)   Pure hypercholesterolemia   History of noncompliance with medical treatment   Primary hypertension   Bilateral lower extremity edema   Cor pulmonale, chronic (HCC)   Pulmonary artery hypertension (HCC)   Obstructive sleep apnea   Morbid obesity (HCC)  #1 bilateral lower extremity cellulitis/associated right anterior leg wound -Patient underwent CT both lower extremities which was negative for underlying abscess. -Clinical improvement. -Status post 7 days IV Rocephin. -Antibiotics discontinued 05/04/2023.  2.  Acute on chronic diastolic CHF exacerbation -Likely secondary to medication nonadherence in the setting of probable OSA and nonadherence to CPAP. -2D echo obtained with a EF of 55 to 60%, mild concentric LVH, normal right systolic function, moderately dilated left atrial size, moderate MVR. -Patient seen in consultation by cardiology currently on IV Lasix with urine output of 2.850 L over the past 24 hours. -Patient is  -13.145 L during this hospitalization. -Current weight of 278.8 pounds from 306 pounds on admission. -Was on IV Lasix and has been transition to oral Lasix per cardiology.  -Continue spironolactone. -Cardiology to resume Jardiance today. -Cardiology recommending that patient start using CPAP consistently for long-term success in managing her edema/right heart failure. -Appreciate cardiology input and recommendations.  3.  Acute metabolic encephalopathy/delirium -Likely secondary to problems #1, 2. -CT head negative for any acute abnormalities. -Clinical improvement, likely close to baseline.  -Outpatient follow-up.  4.  Persistent A-fib -Patient noted to have apparently missed several doses of Tikosyn as outpatient. -Currently rate controlled. -Eliquis for anticoagulation. -Cardiology recommended not to restart Tikosyn. -Outpatient follow-up.  5.  CKD stage IIIb -Stable.  6.  Anemia of chronic disease -H&H stable.  7.  Diabetes mellitus type 2 -Hemoglobin A1c 6.2 (04/28/2023) -CBG 104 this morning. -Continue SSI.  8.  Hypothyroidism -Synthroid.    9.  Hyperlipidemia -Statin.  10.  Depression/anxiety -Continue buspirone and duloxetine.  11.  Morbid obesity -Lifestyle modification -Outpatient follow-up with PCP.  12.  Physical deconditioning -Seen by PT/OT who are recommending SNF placement.  13.  Metabolic acidosis -Resolved.  14.  OSA -Needs to be compliant with CPAP. -CPAP ordered last night however per respiratory therapy's note patient refused.           DVT prophylaxis: Eliquis Code Status: Full Family Communication: Updated patient.  No family at bedside. Disposition: SNF  Status is: Inpatient Remains inpatient appropriate because: Severity of illness   Consultants:  Cardiology: Dr. Mayford Knife 04/29/2023  Procedures:  CT bilateral tib-fib 04/27/2023 CT head 04/27/2023 Chest x-ray 04/27/2023 2D echo 04/28/2023 Bilateral lower  extremity  Dopplers 05/01/2023  Antimicrobials:  Anti-infectives (From admission, onward)    Start     Dose/Rate Route Frequency Ordered Stop   04/28/23 1000  cefTRIAXone (ROCEPHIN) 2 g in sodium chloride 0.9 % 100 mL IVPB        2 g 200 mL/hr over 30 Minutes Intravenous Every 24 hours 04/27/23 2058 05/04/23 1033   04/27/23 1730  cefTRIAXone (ROCEPHIN) 1 g in sodium chloride 0.9 % 100 mL IVPB        1 g 200 mL/hr over 30 Minutes Intravenous  Once 04/27/23 1719 04/27/23 1825         Subjective: Patient states improvement with shortness of breath.  No chest pain.  No abdominal pain.  Having good urine output.   Objective: Vitals:   05/06/23 2027 05/07/23 0350 05/07/23 0500 05/07/23 1501  BP: 123/67 119/66  107/70  Pulse: 72 72  76  Resp: (!) 25   18  Temp: 98.7 F (37.1 C) 98.5 F (36.9 C)  98.5 F (36.9 C)  TempSrc: Oral Oral  Oral  SpO2: 94% 91%  97%  Weight:   126.5 kg   Height:        Intake/Output Summary (Last 24 hours) at 05/07/2023 1908 Last data filed at 05/07/2023 1500 Gross per 24 hour  Intake 960 ml  Output 1350 ml  Net -390 ml   Filed Weights   05/03/23 0500 05/05/23 1258 05/07/23 0500  Weight: 131.2 kg 126.6 kg 126.5 kg    Examination:  General exam: NAD. Respiratory system: CTAB.  No wheezes, no crackles, no rhonchi.  Fair air movement.  Speaking in full sentences. Cardiovascular system: Irregularly irregular.  No JVD.  Trace bilateral lower extremity edema.   Gastrointestinal system: Abdomen is soft, nontender, nondistended, positive bowel sounds.  No rebound.  No guarding.  Central nervous system: Alert and oriented. No focal neurological deficits. Extremities: Bilateral venous stasis changes noted.  Mepilex on bilateral shins.  No warmth, no significant tenderness to palpation.  Trace bilateral lower extremity edema.   Skin: No rashes, lesions or ulcers Psychiatry: Judgement and insight appear normal. Mood & affect appropriate.     Data Reviewed: I  have personally reviewed following labs and imaging studies  CBC: Recent Labs  Lab 05/05/23 0443 05/07/23 0424  WBC 5.4 5.7  NEUTROABS 4.1  --   HGB 9.7* 9.4*  HCT 33.2* 32.1*  MCV 92.7 93.6  PLT 231 243    Basic Metabolic Panel: Recent Labs  Lab 05/03/23 0409 05/04/23 0340 05/05/23 0443 05/06/23 0419 05/07/23 0424  NA 141 139 138 139 138  K 3.9 3.8 3.7 4.4 4.4  CL 101 100 96* 94* 96*  CO2 30 29 32 35* 32  GLUCOSE 115* 125* 108* 121* 105*  BUN 38* 38* 37* 34* 31*  CREATININE 1.07* 1.04* 0.99 1.04* 0.94  CALCIUM 8.7* 8.8* 8.7* 8.9 8.7*  MG 2.0 2.2 2.0 2.0 2.0    GFR: Estimated Creatinine Clearance: 69.3 mL/min (by C-G formula based on SCr of 0.94 mg/dL).  Liver Function Tests: No results for input(s): "AST", "ALT", "ALKPHOS", "BILITOT", "PROT", "ALBUMIN" in the last 168 hours.  CBG: Recent Labs  Lab 05/06/23 2348 05/07/23 0348 05/07/23 0735 05/07/23 1204 05/07/23 1630  GLUCAP 140* 131* 104* 177* 117*     Recent Results (from the past 240 hour(s))  MRSA Next Gen by PCR, Nasal     Status: Abnormal   Collection Time: 04/28/23  9:06 AM   Specimen: Nasal Mucosa;  Nasal Swab  Result Value Ref Range Status   MRSA by PCR Next Gen DETECTED (A) NOT DETECTED Final    Comment: RESULT CALLED TO, READ BACK BY AND VERIFIED WITH: FOUNDER, W.  RN AT 1202 ON 1203 ON 04/28/2023 BY MECIAL J. (NOTE) The GeneXpert MRSA Assay (FDA approved for NASAL specimens only), is one component of a comprehensive MRSA colonization surveillance program. It is not intended to diagnose MRSA infection nor to guide or monitor treatment for MRSA infections. Test performance is not FDA approved in patients less than 76 years old. Performed at [facility], [address] [place], [place] [address]          Radiology Studies: No results found.      Scheduled Meds:  apixaban  5 mg Oral BID   busPIRone  15 mg Oral TID   DULoxetine  90 mg Oral Daily   [START  ON 05/08/2023] empagliflozin  10 mg Oral QAC breakfast   ferrous sulfate  325 mg Oral BID WC   furosemide  80 mg Oral BID   insulin aspart  0-9 Units Subcutaneous Q4H   levothyroxine  125 mcg Oral Q0600   lidocaine  1 patch Transdermal Q24H   melatonin  5 mg Oral QHS   montelukast  10 mg Oral QHS   rosuvastatin  5 mg Oral Daily   sodium chloride flush  3 mL Intravenous Q12H   spironolactone  12.5 mg Oral Daily   Continuous Infusions:   LOS: 9 days    Time spent: 35 mins    Ramiro Harvest, MD [facility]   To contact the attending provider between 7A-7P or the covering provider during after hours 7P-7A, please log into the web site [email or web address] and access using universal [place] password for that web site. If you do not have the password, please call the hospital operator.  05/07/2023, 7:08 PM

## 2023-05-07 NOTE — Progress Notes (Addendum)
Patient Name: Hannah [name] Date of Encounter: 05/07/2023 [facility] Cardiologist: Will Jorja Loa, MD   Interval Summary  .    She is doing good she has some complaints about being seen for rather not been seen by PT and thinks that she needs IV diuretics but this was discussed with her and she understands the goal to transition of p.o. diuretics   Vital Signs .    Vitals:   05/06/23 1143 05/06/23 2027 05/07/23 0350 05/07/23 0500  BP: 115/60 123/67 119/66   Pulse: 82 72 72   Resp: 20 (!) 25    Temp: 98.5 F (36.9 C) 98.7 F (37.1 C) 98.5 F (36.9 C)   TempSrc: Oral Oral Oral   SpO2: 93% 94% 91%   Weight:    126.5 kg  Height:        Intake/Output Summary (Last 24 hours) at 05/07/2023 1223 Last data filed at 05/07/2023 0850 Gross per 24 hour  Intake 840 ml  Output 2200 ml  Net -1360 ml      05/07/2023    5:00 AM 05/05/2023   12:58 PM 05/03/2023    5:00 AM  Last 3 Weights  Weight (lbs) 278 lb 14.1 oz 279 lb 1.6 oz 289 lb 3.9 oz  Weight (kg) 126.5 kg 126.6 kg 131.2 kg      Telemetry/ECG    Atrial fibrillation heart rates in the 80s. - Personally Reviewed  CV Studies    Echocardiogram 04/28/2023 1. Left ventricular ejection fraction, by estimation, is 55 to 60%. The  left ventricle has normal function. Left ventricular endocardial border  not optimally defined to evaluate regional wall motion. There is mild  concentric left ventricular  hypertrophy. Left ventricular diastolic parameters are indeterminate.  There was not sufficient IV access for Definity contrast imaging.   2. Right ventricular systolic function is normal. The right ventricular  size is not well visualized. There is moderately elevated pulmonary artery  systolic pressure. The estimated right ventricular systolic pressure is  54.9 mmHg.   3. Left atrial size was moderately dilated.   4. The mitral valve is grossly normal. Moderate mitral valve  regurgitation. No  evidence of mitral stenosis.   5. Tricuspid valve regurgitation is moderate to severe.   6. The aortic valve is tricuspid. Aortic valve regurgitation is not  visualized. Aortic valve sclerosis is present, with no evidence of aortic  valve stenosis.   7. The inferior vena cava is dilated in size with <50% respiratory  variability, suggesting right atrial pressure of 15 mmHg.   Comparison(s): Prior images reviewed side by side. MR has improved from  prior study, TR may be worse; difficult comparison to 2021 TEE.   Physical Exam .   GEN: No acute distress.   Neck: No JVD Cardiac: Irregular irregular, 2/6 murmur Respiratory: Clear to auscultation bilaterally. GI: Soft, nontender, non-distended  MS: 1+ pitting edema, improving.  Bilateral stasis wounds  Assessment & Plan .     Acute on chronic HFpEF Chronic venous insufficiency Weight on admission 307 pounds.  She has been transition to p.o. diuretics and seems to be doing well with stable weights on this.  She is 278 today.  At baseline she is likely to have some peripheral edema due to her chronic venous insufficiency.  Diuresed 2.8 L.  Has no complaints.  She thinks that she needs IV diuretics however this was explained to her that we try to transition to oral diuretics for maintenance. Continue PO  Lasix 80 mg twice daily and spironolactone 12.5 mg.  Will add back her home Jardiance 10 mg.  Monitor for yeast infections and UTIs.   Echo shows EF 55 to 60% with normal RV function.  Moderately dilated LA. RVSP 54.9.  Longstanding persistent atrial fibrillation NSVT Had ablation in 2021 and was on Tikosyn in July 2022.  She was lost to follow-up last couple years and had not been refilling her Tikosyn or her Eliquis all of this year.  She is rate controlled currently with heart rates in the 80s. Continue Eliquis 5 mg twice daily She has rare occurrences of NSVT.  Maintain potassium greater than 4 magnesium greater than 2.  Moderate  MR Moderate to severe TR Likely due to volume status.  CKD Creatinine on admission 1.3 and has progressively improved with diuresis.  Currently .99  Hyperlipidemia Continue rosuvastatin 5 mg daily  OSA on CPAP Lower extremity cellulitis Delirium with hallucinations appears to have resolved type 2 diabetes Deconditioning Per primary team.  For questions or updates, please contact [facility] Please consult [email or web address] for contact info under        Signed, Abagail Kitchens, PA-C  I have seen and examined the patient along with Abagail Kitchens, PA-C.  I have reviewed the chart, notes and new data.  I agree with PA/NP's note.  Key new complaints: refused CPAP last night Key examination changes: no evidence of weeping from leg wounds, mild edema Key new findings / data: stable renal parameters  PLAN: Seems to be maintaining volume status on current PO diuretic dose.  [facility] will sign off.   Medication Recommendations:   Furosemide 80 mg twice daily Jardiance 190 mg daily Apixaban 5 mg twice daily Spironolactone 12.5 mg daily Other recommendations (labs, testing, etc):  BMET in 2 weeks. Daily weights. Call MD if weight is >280 lb.  Critically important to start and continue CPAP treatment. Follow up as an outpatient:  will schedule f/u in 6-8 weeks, should be out of SNF by then.   Thurmon Fair, MD, [facility] [phone] 05/07/2023, 1:09 PM

## 2023-05-07 NOTE — Plan of Care (Signed)
  Problem: Fluid Volume: Goal: Ability to maintain a balanced intake and output will improve Outcome: Progressing   Problem: Skin Integrity: Goal: Risk for impaired skin integrity will decrease Outcome: Progressing   Problem: Education: Goal: Knowledge of General Education information will improve Description: Including pain rating scale, medication(s)/side effects and non-pharmacologic comfort measures Outcome: Progressing   Problem: Coping: Goal: Level of anxiety will decrease Outcome: Progressing   Problem: Pain Managment: Goal: General experience of comfort will improve Outcome: Progressing   Problem: Safety: Goal: Ability to remain free from injury will improve Outcome: Progressing   Problem: Skin Integrity: Goal: Risk for impaired skin integrity will decrease Outcome: Progressing

## 2023-05-07 NOTE — Progress Notes (Signed)
   05/07/23 2327  BiPAP/CPAP/SIPAP  Reason BIPAP/CPAP not in use Non-compliant

## 2023-05-08 DIAGNOSIS — L0390 Cellulitis, unspecified: Secondary | ICD-10-CM | POA: Diagnosis not present

## 2023-05-08 DIAGNOSIS — I2721 Secondary pulmonary arterial hypertension: Secondary | ICD-10-CM | POA: Diagnosis not present

## 2023-05-08 DIAGNOSIS — I480 Paroxysmal atrial fibrillation: Secondary | ICD-10-CM | POA: Diagnosis not present

## 2023-05-08 DIAGNOSIS — L89223 Pressure ulcer of left hip, stage 3: Secondary | ICD-10-CM | POA: Diagnosis not present

## 2023-05-08 DIAGNOSIS — M79671 Pain in right foot: Secondary | ICD-10-CM | POA: Diagnosis not present

## 2023-05-08 DIAGNOSIS — R0989 Other specified symptoms and signs involving the circulatory and respiratory systems: Secondary | ICD-10-CM | POA: Diagnosis not present

## 2023-05-08 DIAGNOSIS — G9341 Metabolic encephalopathy: Secondary | ICD-10-CM | POA: Diagnosis not present

## 2023-05-08 DIAGNOSIS — D649 Anemia, unspecified: Secondary | ICD-10-CM | POA: Diagnosis not present

## 2023-05-08 DIAGNOSIS — I2781 Cor pulmonale (chronic): Secondary | ICD-10-CM | POA: Diagnosis not present

## 2023-05-08 DIAGNOSIS — M79605 Pain in left leg: Secondary | ICD-10-CM | POA: Diagnosis not present

## 2023-05-08 DIAGNOSIS — K5900 Constipation, unspecified: Secondary | ICD-10-CM | POA: Diagnosis not present

## 2023-05-08 DIAGNOSIS — I70238 Atherosclerosis of native arteries of right leg with ulceration of other part of lower right leg: Secondary | ICD-10-CM | POA: Diagnosis not present

## 2023-05-08 DIAGNOSIS — M79604 Pain in right leg: Secondary | ICD-10-CM | POA: Diagnosis not present

## 2023-05-08 DIAGNOSIS — R609 Edema, unspecified: Secondary | ICD-10-CM | POA: Diagnosis not present

## 2023-05-08 DIAGNOSIS — Z7185 Encounter for immunization safety counseling: Secondary | ICD-10-CM | POA: Diagnosis not present

## 2023-05-08 DIAGNOSIS — R52 Pain, unspecified: Secondary | ICD-10-CM | POA: Diagnosis not present

## 2023-05-08 DIAGNOSIS — N1832 Chronic kidney disease, stage 3b: Secondary | ICD-10-CM | POA: Diagnosis not present

## 2023-05-08 DIAGNOSIS — Z7901 Long term (current) use of anticoagulants: Secondary | ICD-10-CM | POA: Diagnosis not present

## 2023-05-08 DIAGNOSIS — M150 Primary generalized (osteo)arthritis: Secondary | ICD-10-CM | POA: Diagnosis not present

## 2023-05-08 DIAGNOSIS — S81801D Unspecified open wound, right lower leg, subsequent encounter: Secondary | ICD-10-CM | POA: Diagnosis not present

## 2023-05-08 DIAGNOSIS — G4733 Obstructive sleep apnea (adult) (pediatric): Secondary | ICD-10-CM | POA: Diagnosis not present

## 2023-05-08 DIAGNOSIS — Z91199 Patient's noncompliance with other medical treatment and regimen due to unspecified reason: Secondary | ICD-10-CM

## 2023-05-08 DIAGNOSIS — M6281 Muscle weakness (generalized): Secondary | ICD-10-CM | POA: Diagnosis not present

## 2023-05-08 DIAGNOSIS — R29898 Other symptoms and signs involving the musculoskeletal system: Secondary | ICD-10-CM | POA: Diagnosis not present

## 2023-05-08 DIAGNOSIS — S81802D Unspecified open wound, left lower leg, subsequent encounter: Secondary | ICD-10-CM | POA: Diagnosis not present

## 2023-05-08 DIAGNOSIS — I70248 Atherosclerosis of native arteries of left leg with ulceration of other part of lower left leg: Secondary | ICD-10-CM | POA: Diagnosis not present

## 2023-05-08 DIAGNOSIS — I890 Lymphedema, not elsewhere classified: Secondary | ICD-10-CM | POA: Diagnosis not present

## 2023-05-08 DIAGNOSIS — I872 Venous insufficiency (chronic) (peripheral): Secondary | ICD-10-CM | POA: Diagnosis not present

## 2023-05-08 DIAGNOSIS — D6869 Other thrombophilia: Secondary | ICD-10-CM | POA: Diagnosis not present

## 2023-05-08 DIAGNOSIS — I5033 Acute on chronic diastolic (congestive) heart failure: Secondary | ICD-10-CM | POA: Diagnosis not present

## 2023-05-08 DIAGNOSIS — R600 Localized edema: Secondary | ICD-10-CM | POA: Diagnosis not present

## 2023-05-08 DIAGNOSIS — R2681 Unsteadiness on feet: Secondary | ICD-10-CM | POA: Diagnosis not present

## 2023-05-08 DIAGNOSIS — E7800 Pure hypercholesterolemia, unspecified: Secondary | ICD-10-CM | POA: Diagnosis not present

## 2023-05-08 DIAGNOSIS — I10 Essential (primary) hypertension: Secondary | ICD-10-CM | POA: Diagnosis not present

## 2023-05-08 DIAGNOSIS — K219 Gastro-esophageal reflux disease without esophagitis: Secondary | ICD-10-CM | POA: Diagnosis not present

## 2023-05-08 DIAGNOSIS — R531 Weakness: Secondary | ICD-10-CM | POA: Diagnosis not present

## 2023-05-08 DIAGNOSIS — D638 Anemia in other chronic diseases classified elsewhere: Secondary | ICD-10-CM | POA: Diagnosis not present

## 2023-05-08 DIAGNOSIS — G4700 Insomnia, unspecified: Secondary | ICD-10-CM | POA: Diagnosis not present

## 2023-05-08 DIAGNOSIS — L03115 Cellulitis of right lower limb: Secondary | ICD-10-CM | POA: Diagnosis not present

## 2023-05-08 DIAGNOSIS — I4819 Other persistent atrial fibrillation: Secondary | ICD-10-CM | POA: Diagnosis not present

## 2023-05-08 DIAGNOSIS — E039 Hypothyroidism, unspecified: Secondary | ICD-10-CM | POA: Diagnosis not present

## 2023-05-08 DIAGNOSIS — E119 Type 2 diabetes mellitus without complications: Secondary | ICD-10-CM | POA: Diagnosis not present

## 2023-05-08 DIAGNOSIS — E1122 Type 2 diabetes mellitus with diabetic chronic kidney disease: Secondary | ICD-10-CM | POA: Diagnosis not present

## 2023-05-08 DIAGNOSIS — Z743 Need for continuous supervision: Secondary | ICD-10-CM | POA: Diagnosis not present

## 2023-05-08 DIAGNOSIS — J309 Allergic rhinitis, unspecified: Secondary | ICD-10-CM | POA: Diagnosis not present

## 2023-05-08 DIAGNOSIS — L97821 Non-pressure chronic ulcer of other part of left lower leg limited to breakdown of skin: Secondary | ICD-10-CM | POA: Diagnosis not present

## 2023-05-08 DIAGNOSIS — L97811 Non-pressure chronic ulcer of other part of right lower leg limited to breakdown of skin: Secondary | ICD-10-CM | POA: Diagnosis not present

## 2023-05-08 DIAGNOSIS — L89890 Pressure ulcer of other site, unstageable: Secondary | ICD-10-CM | POA: Diagnosis not present

## 2023-05-08 LAB — BASIC METABOLIC PANEL
Anion gap: 10 (ref 5–15)
BUN: 35 mg/dL — ABNORMAL HIGH (ref 8–23)
CO2: 33 mmol/L — ABNORMAL HIGH (ref 22–32)
Calcium: 8.9 mg/dL (ref 8.9–10.3)
Chloride: 95 mmol/L — ABNORMAL LOW (ref 98–111)
Creatinine, Ser: 1.08 mg/dL — ABNORMAL HIGH (ref 0.44–1.00)
GFR, Estimated: 53 mL/min — ABNORMAL LOW (ref >60)
Glucose, Bld: 111 mg/dL — ABNORMAL HIGH (ref 70–99)
Potassium: 4.2 mmol/L (ref 3.5–5.1)
Sodium: 138 mmol/L (ref 135–145)

## 2023-05-08 LAB — GLUCOSE, CAPILLARY
Glucose-Capillary: 102 mg/dL — ABNORMAL HIGH (ref 70–99)
Glucose-Capillary: 107 mg/dL — ABNORMAL HIGH (ref 70–99)
Glucose-Capillary: 121 mg/dL — ABNORMAL HIGH (ref 70–99)
Glucose-Capillary: 161 mg/dL — ABNORMAL HIGH (ref 70–99)

## 2023-05-08 MED ORDER — FERROUS SULFATE 325 (65 FE) MG PO TABS: 325.0000 mg | ORAL_TABLET | Freq: Two times a day (BID) | ORAL | Status: AC

## 2023-05-08 MED ORDER — FUROSEMIDE 80 MG PO TABS: 80.0000 mg | ORAL_TABLET | Freq: Two times a day (BID) | ORAL | Status: AC

## 2023-05-08 MED ORDER — LIDOCAINE 5 % EX PTCH: 1.0000 | MEDICATED_PATCH | CUTANEOUS | 0 refills | Status: AC

## 2023-05-08 MED ORDER — BUSPIRONE HCL 15 MG PO TABS: 15.0000 mg | ORAL_TABLET | Freq: Three times a day (TID) | ORAL | 0 refills | Status: AC

## 2023-05-08 MED ORDER — LEVOTHYROXINE SODIUM 125 MCG PO TABS: 125.0000 ug | ORAL_TABLET | Freq: Every day | ORAL | 0 refills | Status: AC

## 2023-05-08 MED ORDER — TRAMADOL HCL 50 MG PO TABS: 50.0000 mg | ORAL_TABLET | Freq: Four times a day (QID) | ORAL | 0 refills | Status: AC | PRN

## 2023-05-08 NOTE — Discharge Summary (Signed)
Physician Discharge Summary  Hannah [name] WUJ:[id number] DOB: Sep 14, 1945 DOA: 04/27/2023  PCP: Darrow Bussing, MD  Admit date: 04/27/2023 Discharge date: 05/08/2023  Time spent: 60 minutes  Recommendations for Outpatient Follow-up:  Follow-up with MD at skilled nursing facility.  Patient will need a basic metabolic profile done in 2 weeks to follow-up on electrolytes and renal function.  Patient will need daily weights done.  CPAP usage will need to be stressed. Follow-up with Robin Searing, NP, cardiology on June 17, 2023 at 10:40 AM.   Discharge Diagnoses:  Principal Problem:   Acute metabolic encephalopathy Active Problems:   Cellulitis   Acute on chronic diastolic heart failure (HCC)   Pressure injury of skin   DMII (diabetes mellitus, type 2) (HCC)   Persistent atrial fibrillation (HCC)   Chronic venous insufficiency of lower extremity   Acute on chronic diastolic CHF (congestive heart failure) (HCC)   Pure hypercholesterolemia   History of noncompliance with medical treatment   Primary hypertension   Bilateral lower extremity edema   Cor pulmonale, chronic (HCC)   Pulmonary artery hypertension (HCC)   Obstructive sleep apnea   Morbid obesity (HCC)   Discharge Condition: Stable and improved.  Diet recommendation: Heart healthy  Filed Weights   05/05/23 1258 05/07/23 0500 05/08/23 0500  Weight: 126.6 kg 126.5 kg 127 kg    History of present illness:  HPI per Dr. Laurel Dimmer [name] is a 77 y.o. female with medical history significant of A.Fib, DM2, HTN, dCHF, morbid obesity.   Pt brought to ED by EMS after she called PCP and wasn't sure why she had called them.  AAOx4 but actively hallucinating in ED.   Pt lives at home with daughter.  Patient daughter reports that the patient has been unable to get out of the home for 1 year secondary to her "bad knees" and ulcers on her legs. Patient daughter reports that the patient is fallen 4 times in the last  week. Patient daughter states that she is seeing things that are not there. Patient daughter states that she typically walks with a walker but is having trouble getting up on her own recently. The last 2 days the patient is unable to get out of her chair and her legs are very weak. The patient daughter denies any fevers at home but states that the hallucinations began when she started falling. She is also not taking medication to include Lasix and blood thinning medication.   Hospital Course:  #1 bilateral lower extremity cellulitis/associated right anterior leg wound/bilateral stasis wounds. -Patient underwent CT both lower extremities which was negative for underlying abscess. -Patient improved clinically.  -Status post 7 days IV Rocephin. -Antibiotics discontinued 05/04/2023. -No further antibiotics needed.   2.  Acute on chronic diastolic CHF exacerbation -Likely secondary to medication nonadherence in the setting of probable OSA and nonadherence to CPAP. -2D echo obtained with a EF of 55 to 60%, mild concentric LVH, normal right systolic function, moderately dilated left atrial size, moderate MVR. -Patient seen in consultation by cardiology was placed on IV Lasix with good urine output and patient was -13.664 L during this hospitalization.  -On day of discharge patient's weight was down to 279.98 pounds from 306 pounds on admission. -Was on IV Lasix and subsequently transition to oral Lasix per cardiology, patient spironolactone resumed as well as Jardiance.  -Cardiology recommending that patient start using CPAP consistently for long-term success in managing her edema/right heart failure. -Outpatient follow-up with cardiology.   3.  Acute  POP      Full           Yes      Yes                                  +---------+---------------+---------+-----------+----------+---------------+ PTV      Full                                                         +---------+---------------+---------+-----------+----------+---------------+ PERO                                                  Patent by color +---------+---------------+---------+-----------+----------+---------------+     Summary: RIGHT: - There is no evidence of deep vein thrombosis in the lower extremity.  - A cystic structure is found in the popliteal fossa.  LEFT: - There is no evidence of deep vein thrombosis in the lower extremity.  - No cystic structure found in the popliteal fossa.  *See table(s) above for measurements and observations. Electronically signed by Sherald Hess MD on 05/01/2023 at 4:25:19 PM.    Final     ECHOCARDIOGRAM COMPLETE  Result Date: 04/28/2023    ECHOCARDIOGRAM REPORT   Patient Name:   Hannah [name] Date of Exam: 04/28/2023 Medical Rec #:  [id number]           Height:       66.0 in Accession #:    [id number]          Weight:       306.0 lb Date of Birth:  1945-11-21           BSA:          2.396 m Patient Age:    77 years            BP:           145/86 mmHg Patient Gender: F                   HR:           82 bpm. Exam Location:  Inpatient Procedure: 2D Echo, Cardiac Doppler, Color Doppler and Intracardiac            Opacification Agent Indications:    CHF-Acute Diastolic I50.31  History:        Patient has prior history of Echocardiogram examinations, most                 recent 01/18/2018. CHF, Arrythmias:Atrial Fibrillation; Risk                 Factors:Diabetes.  Sonographer:    Lucendia Herrlich RCS Referring Phys: [phone] JARED M GARDNER IMPRESSIONS  1. Left ventricular ejection fraction, by estimation, is 55 to 60%. The left ventricle has normal function. Left ventricular endocardial border not optimally defined to evaluate regional wall motion. There is mild concentric left ventricular hypertrophy. Left ventricular diastolic parameters are indeterminate. There was not sufficient IV access for Definity contrast imaging.  2. Right ventricular systolic function is normal. The right ventricular size is not well visualized. There is moderately elevated pulmonary artery  Montez Hageman., NP Follow up.   Specialty: Cardiology Why: Wednesday Jun 17, 2023 Arrive by 10:40 AMAppt at 10:55 AM (25 min) Contact information: [address] [place] [address] [phone]         MD at SNF Follow up.               Contact information for after-discharge care     Destination     [facility] Preferred SNF .   Service: Skilled Nursing Contact information: [address] [place] [address] [phone]                      The results of significant diagnostics from this hospitalization (including imaging, microbiology, ancillary and laboratory) are listed below for reference.    Significant Diagnostic Studies: VAS [place] LOWER EXTREMITY VENOUS (DVT)  Result Date: 05/01/2023  Lower Venous DVT Study Patient Name:  Hannah [name]  Date of Exam:   05/01/2023 Medical Rec #: [id number]            Accession #:    [id number] Date of Birth: [birth year]            Patient Gender: F Patient Age:   25 years Exam Location:  [facility] Procedure:      VAS [place] LOWER EXTREMITY VENOUS (DVT) Referring Phys: Gloris Manchester TURNER --------------------------------------------------------------------------------  Indications: Swelling, and Edema.  Risk Factors: Obesity. Limitations: Body habitus. Comparison Study: No significant changes seen since previous exam 09/14/16 Performing Technologist: Shona Simpson  Examination Guidelines: A complete evaluation includes B-mode imaging, spectral Doppler, color Doppler, and power Doppler as needed of all accessible portions of each vessel. Bilateral  testing is considered an integral part of a complete examination. Limited examinations for reoccurring indications may be performed as noted. The reflux portion of the exam is performed with the patient in reverse Trendelenburg.  +---------+---------------+---------+-----------+----------+---------------+ RIGHT    CompressibilityPhasicitySpontaneityPropertiesThrombus Aging  +---------+---------------+---------+-----------+----------+---------------+ CFV      Full                                                         +---------+---------------+---------+-----------+----------+---------------+ SFJ      Full                                                         +---------+---------------+---------+-----------+----------+---------------+ FV Prox  Full                                                         +---------+---------------+---------+-----------+----------+---------------+ FV Mid   Full                                                         +---------+---------------+---------+-----------+----------+---------------+ FV Distal  POP      Full           Yes      Yes                                  +---------+---------------+---------+-----------+----------+---------------+ PTV      Full                                                         +---------+---------------+---------+-----------+----------+---------------+ PERO                                                  Patent by color +---------+---------------+---------+-----------+----------+---------------+     Summary: RIGHT: - There is no evidence of deep vein thrombosis in the lower extremity.  - A cystic structure is found in the popliteal fossa.  LEFT: - There is no evidence of deep vein thrombosis in the lower extremity.  - No cystic structure found in the popliteal fossa.  *See table(s) above for measurements and observations. Electronically signed by Sherald Hess MD on 05/01/2023 at 4:25:19 PM.    Final     ECHOCARDIOGRAM COMPLETE  Result Date: 04/28/2023    ECHOCARDIOGRAM REPORT   Patient Name:   Hannah [name] Date of Exam: 04/28/2023 Medical Rec #:  [id number]           Height:       66.0 in Accession #:    [id number]          Weight:       306.0 lb Date of Birth:  1945-11-21           BSA:          2.396 m Patient Age:    77 years            BP:           145/86 mmHg Patient Gender: F                   HR:           82 bpm. Exam Location:  Inpatient Procedure: 2D Echo, Cardiac Doppler, Color Doppler and Intracardiac            Opacification Agent Indications:    CHF-Acute Diastolic I50.31  History:        Patient has prior history of Echocardiogram examinations, most                 recent 01/18/2018. CHF, Arrythmias:Atrial Fibrillation; Risk                 Factors:Diabetes.  Sonographer:    Lucendia Herrlich RCS Referring Phys: [phone] JARED M GARDNER IMPRESSIONS  1. Left ventricular ejection fraction, by estimation, is 55 to 60%. The left ventricle has normal function. Left ventricular endocardial border not optimally defined to evaluate regional wall motion. There is mild concentric left ventricular hypertrophy. Left ventricular diastolic parameters are indeterminate. There was not sufficient IV access for Definity contrast imaging.  2. Right ventricular systolic function is normal. The right ventricular size is not well visualized. There is moderately elevated pulmonary artery  Montez Hageman., NP Follow up.   Specialty: Cardiology Why: Wednesday Jun 17, 2023 Arrive by 10:40 AMAppt at 10:55 AM (25 min) Contact information: [address] [place] [address] [phone]         MD at SNF Follow up.               Contact information for after-discharge care     Destination     [facility] Preferred SNF .   Service: Skilled Nursing Contact information: [address] [place] [address] [phone]                      The results of significant diagnostics from this hospitalization (including imaging, microbiology, ancillary and laboratory) are listed below for reference.    Significant Diagnostic Studies: VAS [place] LOWER EXTREMITY VENOUS (DVT)  Result Date: 05/01/2023  Lower Venous DVT Study Patient Name:  Hannah [name]  Date of Exam:   05/01/2023 Medical Rec #: [id number]            Accession #:    [id number] Date of Birth: [birth year]            Patient Gender: F Patient Age:   25 years Exam Location:  [facility] Procedure:      VAS [place] LOWER EXTREMITY VENOUS (DVT) Referring Phys: Gloris Manchester TURNER --------------------------------------------------------------------------------  Indications: Swelling, and Edema.  Risk Factors: Obesity. Limitations: Body habitus. Comparison Study: No significant changes seen since previous exam 09/14/16 Performing Technologist: Shona Simpson  Examination Guidelines: A complete evaluation includes B-mode imaging, spectral Doppler, color Doppler, and power Doppler as needed of all accessible portions of each vessel. Bilateral  testing is considered an integral part of a complete examination. Limited examinations for reoccurring indications may be performed as noted. The reflux portion of the exam is performed with the patient in reverse Trendelenburg.  +---------+---------------+---------+-----------+----------+---------------+ RIGHT    CompressibilityPhasicitySpontaneityPropertiesThrombus Aging  +---------+---------------+---------+-----------+----------+---------------+ CFV      Full                                                         +---------+---------------+---------+-----------+----------+---------------+ SFJ      Full                                                         +---------+---------------+---------+-----------+----------+---------------+ FV Prox  Full                                                         +---------+---------------+---------+-----------+----------+---------------+ FV Mid   Full                                                         +---------+---------------+---------+-----------+----------+---------------+ FV Distal  Montez Hageman., NP Follow up.   Specialty: Cardiology Why: Wednesday Jun 17, 2023 Arrive by 10:40 AMAppt at 10:55 AM (25 min) Contact information: [address] [place] [address] [phone]         MD at SNF Follow up.               Contact information for after-discharge care     Destination     [facility]erred [facility] .   Service: Skilled Nursing Contact information: [address] [place] [address] [phone]                      The results of significant diagnostics from this hospitalization (including imaging, microbiology, ancillary and laboratory) are listed below for reference.    Significant Diagnostic Studies: VAS [place] LOWER EXTREMITY VENOUS (DVT)  Result Date: 05/01/2023  Lower Venous DVT Study Patient Name:  Hannah [name]  Date of Exam:   05/01/2023 Medical Rec #: [id number]            Accession #:    [id number] Date of Birth: [birth year]            Patient Gender: F Patient Age:   25 years Exam Location:  [facility] Procedure:      VAS [place] LOWER EXTREMITY VENOUS (DVT) Referring Phys: Gloris Manchester TURNER --------------------------------------------------------------------------------  Indications: Swelling, and Edema.  Risk Factors: Obesity. Limitations: Body habitus. Comparison Study: No significant changes seen since previous exam 09/14/16 Performing Technologist: Shona Simpson  Examination Guidelines: A complete evaluation includes B-mode imaging, spectral Doppler, color Doppler, and power Doppler as needed of all accessible portions of each vessel. Bilateral  testing is considered an integral part of a complete examination. Limited examinations for reoccurring indications may be performed as noted. The reflux portion of the exam is performed with the patient in reverse Trendelenburg.  +---------+---------------+---------+-----------+----------+---------------+ RIGHT    CompressibilityPhasicitySpontaneityPropertiesThrombus Aging  +---------+---------------+---------+-----------+----------+---------------+ CFV      Full                                                         +---------+---------------+---------+-----------+----------+---------------+ SFJ      Full                                                         +---------+---------------+---------+-----------+----------+---------------+ FV Prox  Full                                                         +---------+---------------+---------+-----------+----------+---------------+ FV Mid   Full                                                         +---------+---------------+---------+-----------+----------+---------------+ FV Distal  Montez Hageman., NP Follow up.   Specialty: Cardiology Why: Wednesday Jun 17, 2023 Arrive by 10:40 AMAppt at 10:55 AM (25 min) Contact information: [address] [place] [address] [phone]         MD at SNF Follow up.               Contact information for after-discharge care     Destination     [facility] .   Service: Skilled Nursing Contact information: [address] [place] [address] [phone]                      The results of significant diagnostics from this hospitalization (including imaging, microbiology, ancillary and laboratory) are listed below for reference.    Significant Diagnostic Studies: VAS [place] LOWER EXTREMITY VENOUS (DVT)  Result Date: 05/01/2023  Lower Venous DVT Study Patient Name:  Hannah [name]  Date of Exam:   05/01/2023 Medical Rec #: [id number]            Accession #:    [id number] Date of Birth: [birth year]            Patient Gender: F Patient Age:   25 years Exam Location:  [facility] Procedure:      VAS [place] LOWER EXTREMITY VENOUS (DVT) Referring Phys: Gloris Manchester TURNER --------------------------------------------------------------------------------  Indications: Swelling, and Edema.  Risk Factors: Obesity. Limitations: Body habitus. Comparison Study: No significant changes seen since previous exam 09/14/16 Performing Technologist: Shona Simpson  Examination Guidelines: A complete evaluation includes B-mode imaging, spectral Doppler, color Doppler, and power Doppler as needed of all accessible portions of each vessel. Bilateral  testing is considered an integral part of a complete examination. Limited examinations for reoccurring indications may be performed as noted. The reflux portion of the exam is performed with the patient in reverse Trendelenburg.  +---------+---------------+---------+-----------+----------+---------------+ RIGHT    CompressibilityPhasicitySpontaneityPropertiesThrombus Aging  +---------+---------------+---------+-----------+----------+---------------+ CFV      Full                                                         +---------+---------------+---------+-----------+----------+---------------+ SFJ      Full                                                         +---------+---------------+---------+-----------+----------+---------------+ FV Prox  Full                                                         +---------+---------------+---------+-----------+----------+---------------+ FV Mid   Full                                                         +---------+---------------+---------+-----------+----------+---------------+ FV Distal  Montez Hageman., NP Follow up.   Specialty: Cardiology Why: Wednesday Jun 17, 2023 Arrive by 10:40 AMAppt at 10:55 AM (25 min) Contact information: [address] [place] [address] [phone]         MD at SNF Follow up.               Contact information for after-discharge care     Destination     [facility] .   Service: Skilled Nursing Contact information: [address] [place] [address] [phone]                      The results of significant diagnostics from this hospitalization (including imaging, microbiology, ancillary and laboratory) are listed below for reference.    Significant Diagnostic Studies: VAS [place] LOWER EXTREMITY VENOUS (DVT)  Result Date: 05/01/2023  Lower Venous DVT Study Patient Name:  Hannah [name]  Date of Exam:   05/01/2023 Medical Rec #: [id number]            Accession #:    [id number] Date of Birth: [birth year]            Patient Gender: F Patient Age:   25 years Exam Location:  [facility] Procedure:      VAS [place] LOWER EXTREMITY VENOUS (DVT) Referring Phys: Gloris Manchester TURNER --------------------------------------------------------------------------------  Indications: Swelling, and Edema.  Risk Factors: Obesity. Limitations: Body habitus. Comparison Study: No significant changes seen since previous exam 09/14/16 Performing Technologist: Shona Simpson  Examination Guidelines: A complete evaluation includes B-mode imaging, spectral Doppler, color Doppler, and power Doppler as needed of all accessible portions of each vessel. Bilateral  testing is considered an integral part of a complete examination. Limited examinations for reoccurring indications may be performed as noted. The reflux portion of the exam is performed with the patient in reverse Trendelenburg.  +---------+---------------+---------+-----------+----------+---------------+ RIGHT    CompressibilityPhasicitySpontaneityPropertiesThrombus Aging  +---------+---------------+---------+-----------+----------+---------------+ CFV      Full                                                         +---------+---------------+---------+-----------+----------+---------------+ SFJ      Full                                                         +---------+---------------+---------+-----------+----------+---------------+ FV Prox  Full                                                         +---------+---------------+---------+-----------+----------+---------------+ FV Mid   Full                                                         +---------+---------------+---------+-----------+----------+---------------+ FV Distal  Montez Hageman., NP Follow up.   Specialty: Cardiology Why: Wednesday Jun 17, 2023 Arrive by 10:40 AMAppt at 10:55 AM (25 min) Contact information: [address] [place] [address] [phone]         MD at SNF Follow up.               Contact information for after-discharge care     Destination     [facility] .   Service: Skilled Nursing Contact information: [address] [place] [address] [phone]                      The results of significant diagnostics from this hospitalization (including imaging, microbiology, ancillary and laboratory) are listed below for reference.    Significant Diagnostic Studies: VAS [place] LOWER EXTREMITY VENOUS (DVT)  Result Date: 05/01/2023  Lower Venous DVT Study Patient Name:  Hannah [name]  Date of Exam:   05/01/2023 Medical Rec #: [id number]            Accession #:    [id number] Date of Birth: [birth year]            Patient Gender: F Patient Age:   25 years Exam Location:  [facility] Procedure:      VAS [place] LOWER EXTREMITY VENOUS (DVT) Referring Phys: Gloris Manchester TURNER --------------------------------------------------------------------------------  Indications: Swelling, and Edema.  Risk Factors: Obesity. Limitations: Body habitus. Comparison Study: No significant changes seen since previous exam 09/14/16 Performing Technologist: Shona Simpson  Examination Guidelines: A complete evaluation includes B-mode imaging, spectral Doppler, color Doppler, and power Doppler as needed of all accessible portions of each vessel. Bilateral  testing is considered an integral part of a complete examination. Limited examinations for reoccurring indications may be performed as noted. The reflux portion of the exam is performed with the patient in reverse Trendelenburg.  +---------+---------------+---------+-----------+----------+---------------+ RIGHT    CompressibilityPhasicitySpontaneityPropertiesThrombus Aging  +---------+---------------+---------+-----------+----------+---------------+ CFV      Full                                                         +---------+---------------+---------+-----------+----------+---------------+ SFJ      Full                                                         +---------+---------------+---------+-----------+----------+---------------+ FV Prox  Full                                                         +---------+---------------+---------+-----------+----------+---------------+ FV Mid   Full                                                         +---------+---------------+---------+-----------+----------+---------------+ FV Distal  POP      Full           Yes      Yes                                  +---------+---------------+---------+-----------+----------+---------------+ PTV      Full                                                         +---------+---------------+---------+-----------+----------+---------------+ PERO                                                  Patent by color +---------+---------------+---------+-----------+----------+---------------+     Summary: RIGHT: - There is no evidence of deep vein thrombosis in the lower extremity.  - A cystic structure is found in the popliteal fossa.  LEFT: - There is no evidence of deep vein thrombosis in the lower extremity.  - No cystic structure found in the popliteal fossa.  *See table(s) above for measurements and observations. Electronically signed by Sherald Hess MD on 05/01/2023 at 4:25:19 PM.    Final     ECHOCARDIOGRAM COMPLETE  Result Date: 04/28/2023    ECHOCARDIOGRAM REPORT   Patient Name:   Hannah [name] Date of Exam: 04/28/2023 Medical Rec #:  [id number]           Height:       66.0 in Accession #:    [id number]          Weight:       306.0 lb Date of Birth:  1945-11-21           BSA:          2.396 m Patient Age:    77 years            BP:           145/86 mmHg Patient Gender: F                   HR:           82 bpm. Exam Location:  Inpatient Procedure: 2D Echo, Cardiac Doppler, Color Doppler and Intracardiac            Opacification Agent Indications:    CHF-Acute Diastolic I50.31  History:        Patient has prior history of Echocardiogram examinations, most                 recent 01/18/2018. CHF, Arrythmias:Atrial Fibrillation; Risk                 Factors:Diabetes.  Sonographer:    Lucendia Herrlich RCS Referring Phys: [phone] JARED M GARDNER IMPRESSIONS  1. Left ventricular ejection fraction, by estimation, is 55 to 60%. The left ventricle has normal function. Left ventricular endocardial border not optimally defined to evaluate regional wall motion. There is mild concentric left ventricular hypertrophy. Left ventricular diastolic parameters are indeterminate. There was not sufficient IV access for Definity contrast imaging.  2. Right ventricular systolic function is normal. The right ventricular size is not well visualized. There is moderately elevated pulmonary artery

## 2023-05-08 NOTE — TOC Transition Note (Addendum)
Transition of Care [facility]) - CM/SW Discharge Note   Patient Details  Name: Romina Pulis MRN: [id number] Date of Birth: 11/12/1945  [facility] CM/SW Contact:  Larrie Kass, LCSW Phone Number: 05/08/2023, 1:44 PM   Clinical Narrative:     Pt to d/c to [facility], Room 113, RN to call report [phone]. CSW spoke with pt's daughter Jennette Kettle , she has agreed to d/c plan . PTAR called . No further TOC needs , TOC sign off.       Patient Goals and CMS Choice CMS Medicare.gov Compare Post Acute Care list provided to:: Patient Choice offered to / list presented to : Patient  Discharge Placement                Patient chooses bed at: [facility] Patient to be transferred to facility by: EMS Name of family member notified: [name],Deanna (Daughter)  [phone] (Mobile) Patient and family notified of of transfer: 05/08/23  Discharge Plan and Services Additional resources added to the After Visit Summary for                                       Social Determinants of Health (SDOH) Interventions SDOH Screenings   Food Insecurity: No Food Insecurity (04/27/2023)  Housing: Low Risk  (04/27/2023)  Transportation Needs: No Transportation Needs (04/27/2023)  Utilities: Not At Risk (04/27/2023)  Social Connections: Unknown (11/26/2021)   Received from [facility]  Tobacco Use: Low Risk  (04/27/2023)     Readmission Risk Interventions     No data to display

## 2023-05-08 NOTE — Progress Notes (Signed)
Attempted to call report twice, was on hold for 15 minutes and attempted to speak to nurse in charge on unit. Was placed on hold everytime at [facility].

## 2023-05-08 NOTE — Plan of Care (Signed)
  Problem: Coping: Goal: Ability to adjust to condition or change in health will improve Outcome: Progressing   Problem: Clinical Measurements: Goal: Respiratory complications will improve Outcome: Progressing Goal: Cardiovascular complication will be avoided Outcome: Progressing   Problem: Coping: Goal: Level of anxiety will decrease Outcome: Progressing   Problem: Pain Managment: Goal: General experience of comfort will improve Outcome: Progressing   Problem: Safety: Goal: Ability to remain free from injury will improve Outcome: Progressing

## 2023-05-09 DIAGNOSIS — I480 Paroxysmal atrial fibrillation: Secondary | ICD-10-CM | POA: Diagnosis not present

## 2023-05-09 DIAGNOSIS — R29898 Other symptoms and signs involving the musculoskeletal system: Secondary | ICD-10-CM | POA: Diagnosis not present

## 2023-05-09 DIAGNOSIS — I5033 Acute on chronic diastolic (congestive) heart failure: Secondary | ICD-10-CM | POA: Diagnosis not present

## 2023-05-09 DIAGNOSIS — L03115 Cellulitis of right lower limb: Secondary | ICD-10-CM | POA: Diagnosis not present

## 2023-05-10 DIAGNOSIS — R0989 Other specified symptoms and signs involving the circulatory and respiratory systems: Secondary | ICD-10-CM | POA: Diagnosis not present

## 2023-05-10 DIAGNOSIS — G4700 Insomnia, unspecified: Secondary | ICD-10-CM | POA: Diagnosis not present

## 2023-05-10 DIAGNOSIS — R29898 Other symptoms and signs involving the musculoskeletal system: Secondary | ICD-10-CM | POA: Diagnosis not present

## 2023-05-11 DIAGNOSIS — L97811 Non-pressure chronic ulcer of other part of right lower leg limited to breakdown of skin: Secondary | ICD-10-CM | POA: Diagnosis not present

## 2023-05-11 DIAGNOSIS — M79605 Pain in left leg: Secondary | ICD-10-CM | POA: Diagnosis not present

## 2023-05-11 DIAGNOSIS — L0390 Cellulitis, unspecified: Secondary | ICD-10-CM | POA: Diagnosis not present

## 2023-05-11 DIAGNOSIS — I70248 Atherosclerosis of native arteries of left leg with ulceration of other part of lower left leg: Secondary | ICD-10-CM | POA: Diagnosis not present

## 2023-05-11 DIAGNOSIS — M79604 Pain in right leg: Secondary | ICD-10-CM | POA: Diagnosis not present

## 2023-05-11 DIAGNOSIS — I70238 Atherosclerosis of native arteries of right leg with ulceration of other part of lower right leg: Secondary | ICD-10-CM | POA: Diagnosis not present

## 2023-05-11 DIAGNOSIS — I5033 Acute on chronic diastolic (congestive) heart failure: Secondary | ICD-10-CM | POA: Diagnosis not present

## 2023-05-11 DIAGNOSIS — L03115 Cellulitis of right lower limb: Secondary | ICD-10-CM | POA: Diagnosis not present

## 2023-05-11 DIAGNOSIS — L89890 Pressure ulcer of other site, unstageable: Secondary | ICD-10-CM | POA: Diagnosis not present

## 2023-05-12 DIAGNOSIS — R29898 Other symptoms and signs involving the musculoskeletal system: Secondary | ICD-10-CM | POA: Diagnosis not present

## 2023-05-12 DIAGNOSIS — D649 Anemia, unspecified: Secondary | ICD-10-CM | POA: Diagnosis not present

## 2023-05-12 DIAGNOSIS — I872 Venous insufficiency (chronic) (peripheral): Secondary | ICD-10-CM | POA: Diagnosis not present

## 2023-05-13 DIAGNOSIS — R29898 Other symptoms and signs involving the musculoskeletal system: Secondary | ICD-10-CM | POA: Diagnosis not present

## 2023-05-13 DIAGNOSIS — I480 Paroxysmal atrial fibrillation: Secondary | ICD-10-CM | POA: Diagnosis not present

## 2023-05-13 DIAGNOSIS — I890 Lymphedema, not elsewhere classified: Secondary | ICD-10-CM | POA: Diagnosis not present

## 2023-05-13 DIAGNOSIS — I5033 Acute on chronic diastolic (congestive) heart failure: Secondary | ICD-10-CM | POA: Diagnosis not present

## 2023-05-14 DIAGNOSIS — M79604 Pain in right leg: Secondary | ICD-10-CM | POA: Diagnosis not present

## 2023-05-14 DIAGNOSIS — M79605 Pain in left leg: Secondary | ICD-10-CM | POA: Diagnosis not present

## 2023-05-14 DIAGNOSIS — R29898 Other symptoms and signs involving the musculoskeletal system: Secondary | ICD-10-CM | POA: Diagnosis not present

## 2023-05-14 DIAGNOSIS — K5900 Constipation, unspecified: Secondary | ICD-10-CM | POA: Diagnosis not present

## 2023-05-14 DIAGNOSIS — G4700 Insomnia, unspecified: Secondary | ICD-10-CM | POA: Diagnosis not present

## 2023-05-18 DIAGNOSIS — R29898 Other symptoms and signs involving the musculoskeletal system: Secondary | ICD-10-CM | POA: Diagnosis not present

## 2023-05-18 DIAGNOSIS — M79605 Pain in left leg: Secondary | ICD-10-CM | POA: Diagnosis not present

## 2023-05-18 DIAGNOSIS — M79604 Pain in right leg: Secondary | ICD-10-CM | POA: Diagnosis not present

## 2023-05-19 DIAGNOSIS — Z7185 Encounter for immunization safety counseling: Secondary | ICD-10-CM | POA: Diagnosis not present

## 2023-05-19 DIAGNOSIS — R29898 Other symptoms and signs involving the musculoskeletal system: Secondary | ICD-10-CM | POA: Diagnosis not present

## 2023-05-20 DIAGNOSIS — D649 Anemia, unspecified: Secondary | ICD-10-CM | POA: Diagnosis not present

## 2023-05-20 DIAGNOSIS — N1832 Chronic kidney disease, stage 3b: Secondary | ICD-10-CM | POA: Diagnosis not present

## 2023-05-20 DIAGNOSIS — M79671 Pain in right foot: Secondary | ICD-10-CM | POA: Diagnosis not present

## 2023-05-20 DIAGNOSIS — R29898 Other symptoms and signs involving the musculoskeletal system: Secondary | ICD-10-CM | POA: Diagnosis not present

## 2023-05-21 DIAGNOSIS — I5033 Acute on chronic diastolic (congestive) heart failure: Secondary | ICD-10-CM | POA: Diagnosis not present

## 2023-05-21 DIAGNOSIS — M6281 Muscle weakness (generalized): Secondary | ICD-10-CM | POA: Diagnosis not present

## 2023-05-21 DIAGNOSIS — L0390 Cellulitis, unspecified: Secondary | ICD-10-CM | POA: Diagnosis not present

## 2023-05-21 DIAGNOSIS — R29898 Other symptoms and signs involving the musculoskeletal system: Secondary | ICD-10-CM | POA: Diagnosis not present

## 2023-05-21 DIAGNOSIS — E039 Hypothyroidism, unspecified: Secondary | ICD-10-CM | POA: Diagnosis not present

## 2023-05-21 DIAGNOSIS — M79671 Pain in right foot: Secondary | ICD-10-CM | POA: Diagnosis not present

## 2023-05-21 DIAGNOSIS — L97821 Non-pressure chronic ulcer of other part of left lower leg limited to breakdown of skin: Secondary | ICD-10-CM | POA: Diagnosis not present

## 2023-05-21 DIAGNOSIS — J309 Allergic rhinitis, unspecified: Secondary | ICD-10-CM | POA: Diagnosis not present

## 2023-05-21 DIAGNOSIS — L89890 Pressure ulcer of other site, unstageable: Secondary | ICD-10-CM | POA: Diagnosis not present

## 2023-05-21 DIAGNOSIS — L97811 Non-pressure chronic ulcer of other part of right lower leg limited to breakdown of skin: Secondary | ICD-10-CM | POA: Diagnosis not present

## 2023-05-21 DIAGNOSIS — R2681 Unsteadiness on feet: Secondary | ICD-10-CM | POA: Diagnosis not present

## 2023-05-21 DIAGNOSIS — G4700 Insomnia, unspecified: Secondary | ICD-10-CM | POA: Diagnosis not present

## 2023-05-22 DIAGNOSIS — M79671 Pain in right foot: Secondary | ICD-10-CM | POA: Diagnosis not present

## 2023-05-22 DIAGNOSIS — R29898 Other symptoms and signs involving the musculoskeletal system: Secondary | ICD-10-CM | POA: Diagnosis not present

## 2023-05-23 DIAGNOSIS — R29898 Other symptoms and signs involving the musculoskeletal system: Secondary | ICD-10-CM | POA: Diagnosis not present

## 2023-05-23 DIAGNOSIS — M79671 Pain in right foot: Secondary | ICD-10-CM | POA: Diagnosis not present

## 2023-05-25 DIAGNOSIS — L0390 Cellulitis, unspecified: Secondary | ICD-10-CM | POA: Diagnosis not present

## 2023-05-25 DIAGNOSIS — L97821 Non-pressure chronic ulcer of other part of left lower leg limited to breakdown of skin: Secondary | ICD-10-CM | POA: Diagnosis not present

## 2023-05-25 DIAGNOSIS — L89890 Pressure ulcer of other site, unstageable: Secondary | ICD-10-CM | POA: Diagnosis not present

## 2023-05-25 DIAGNOSIS — L97811 Non-pressure chronic ulcer of other part of right lower leg limited to breakdown of skin: Secondary | ICD-10-CM | POA: Diagnosis not present

## 2023-05-27 DIAGNOSIS — S81802D Unspecified open wound, left lower leg, subsequent encounter: Secondary | ICD-10-CM | POA: Diagnosis not present

## 2023-05-27 DIAGNOSIS — R29898 Other symptoms and signs involving the musculoskeletal system: Secondary | ICD-10-CM | POA: Diagnosis not present

## 2023-05-27 DIAGNOSIS — R52 Pain, unspecified: Secondary | ICD-10-CM | POA: Diagnosis not present

## 2023-05-27 DIAGNOSIS — I872 Venous insufficiency (chronic) (peripheral): Secondary | ICD-10-CM | POA: Diagnosis not present

## 2023-05-30 DIAGNOSIS — G4733 Obstructive sleep apnea (adult) (pediatric): Secondary | ICD-10-CM | POA: Diagnosis not present

## 2023-05-30 DIAGNOSIS — R29898 Other symptoms and signs involving the musculoskeletal system: Secondary | ICD-10-CM | POA: Diagnosis not present

## 2023-05-30 DIAGNOSIS — G4700 Insomnia, unspecified: Secondary | ICD-10-CM | POA: Diagnosis not present

## 2023-06-01 DIAGNOSIS — L89890 Pressure ulcer of other site, unstageable: Secondary | ICD-10-CM | POA: Diagnosis not present

## 2023-06-01 DIAGNOSIS — L97811 Non-pressure chronic ulcer of other part of right lower leg limited to breakdown of skin: Secondary | ICD-10-CM | POA: Diagnosis not present

## 2023-06-01 DIAGNOSIS — L0390 Cellulitis, unspecified: Secondary | ICD-10-CM | POA: Diagnosis not present

## 2023-06-01 DIAGNOSIS — L97821 Non-pressure chronic ulcer of other part of left lower leg limited to breakdown of skin: Secondary | ICD-10-CM | POA: Diagnosis not present

## 2023-06-02 DIAGNOSIS — S81801D Unspecified open wound, right lower leg, subsequent encounter: Secondary | ICD-10-CM | POA: Diagnosis not present

## 2023-06-02 DIAGNOSIS — S81802D Unspecified open wound, left lower leg, subsequent encounter: Secondary | ICD-10-CM | POA: Diagnosis not present

## 2023-06-02 DIAGNOSIS — R29898 Other symptoms and signs involving the musculoskeletal system: Secondary | ICD-10-CM | POA: Diagnosis not present

## 2023-06-03 ENCOUNTER — Other Ambulatory Visit: Payer: Self-pay | Admitting: *Deleted

## 2023-06-03 NOTE — Patient Outreach (Signed)
Hannah [name] resides in [facility] skilled nursing facility.  Screening for potential chronic care management services as a benefit of health plan and primary care provider.  Telephonic collaborative meeting with Wilkie Aye, SNF social worker and Dipam, Sales promotion account executive. Recent discharge notification was appealed and won. Transition date not known yet. Transition plan is to return home with daughter.  [facility] has care management services. Writer will send secure notification to [facility] care management team upon SNF discharge.   Raiford Noble, MSN, RN, BSN [place]  [facility], [facility] RN Post- Acute Care Manager Direct Dial: [phone]

## 2023-06-05 DIAGNOSIS — S81802D Unspecified open wound, left lower leg, subsequent encounter: Secondary | ICD-10-CM | POA: Diagnosis not present

## 2023-06-05 DIAGNOSIS — R29898 Other symptoms and signs involving the musculoskeletal system: Secondary | ICD-10-CM | POA: Diagnosis not present

## 2023-06-05 DIAGNOSIS — S81801D Unspecified open wound, right lower leg, subsequent encounter: Secondary | ICD-10-CM | POA: Diagnosis not present

## 2023-06-06 DIAGNOSIS — M79604 Pain in right leg: Secondary | ICD-10-CM | POA: Diagnosis not present

## 2023-06-06 DIAGNOSIS — S81801D Unspecified open wound, right lower leg, subsequent encounter: Secondary | ICD-10-CM | POA: Diagnosis not present

## 2023-06-06 DIAGNOSIS — M79605 Pain in left leg: Secondary | ICD-10-CM | POA: Diagnosis not present

## 2023-06-08 DIAGNOSIS — L89890 Pressure ulcer of other site, unstageable: Secondary | ICD-10-CM | POA: Diagnosis not present

## 2023-06-08 DIAGNOSIS — L0390 Cellulitis, unspecified: Secondary | ICD-10-CM | POA: Diagnosis not present

## 2023-06-08 DIAGNOSIS — L97811 Non-pressure chronic ulcer of other part of right lower leg limited to breakdown of skin: Secondary | ICD-10-CM | POA: Diagnosis not present

## 2023-06-08 DIAGNOSIS — L97821 Non-pressure chronic ulcer of other part of left lower leg limited to breakdown of skin: Secondary | ICD-10-CM | POA: Diagnosis not present

## 2023-06-09 DIAGNOSIS — R29898 Other symptoms and signs involving the musculoskeletal system: Secondary | ICD-10-CM | POA: Diagnosis not present

## 2023-06-09 DIAGNOSIS — R52 Pain, unspecified: Secondary | ICD-10-CM | POA: Diagnosis not present

## 2023-06-09 DIAGNOSIS — G4700 Insomnia, unspecified: Secondary | ICD-10-CM | POA: Diagnosis not present

## 2023-06-09 DIAGNOSIS — I872 Venous insufficiency (chronic) (peripheral): Secondary | ICD-10-CM | POA: Diagnosis not present

## 2023-06-16 NOTE — Progress Notes (Unsigned)
Cardiology Office Note    Patient Name: Hannah [name] Date of Encounter: 06/16/2023  Primary Care Provider:  Darrow Bussing, MD Primary Cardiologist:  Will Jorja Loa, MD Primary Electrophysiologist: None   Past Medical History    Past Medical History:  Diagnosis Date   Atrial fibrillation (HCC)    Diabetes mellitus without complication (HCC)    Glaucoma    BILATERAL   Hypertension    Thyroid disease    Volume overload 11/2019    History of Present Illness  Hannah [name] is a 77 y.o. female with a PMH of chronic diastolic CHF, persistent AF (on Tikosyn and Eliquis), HTN, HLD, DM type II, CKD stage IIIb, OSA (on CPAP), moderate MR who presents today for hospital follow-up.  She is currently followed by Dr. Elberta Fortis for management of AF and was previously followed by advanced heart failure clinic for management of CHF.  She was last followed up by EP 2 years ago and presented to the ED on 04/27/2023 with complaint of altered mental status.  Patient was found to be hallucinating that occurred after suffering multiple falls.  She was found to have bilateral lower extremity edema with ulcers is currently living with her daughter.  A walker but has had trouble getting up due to bad knees.  Noncompliant with multiple medications over the past year.  EKG was completed showing AF with RVR and BNP was mildly elevated at 314.  A CT of the head that was negative and 2D echo was completed showing EF of 55 to 60% with mild LVH normal RV with moderate MR and moderate to severe TR with moderate PHT.  She was diagnosed with cellulitis and was found to have significant lower extremity edema.  She was started on IV Lasix 40 mg diuresis increasing to 80 mg.  She was restarted on multiple medications including Eliquis but not restarted on Tikosyn.  She was not started on Jardiance due to urinary incontinence and risk for UTI.  Venous Dopplers completed to rule out DVT that were negative.  She has  significant diuresis of 13.6 L with IV Lasix and was transitioned to p.o.  Started back on Jardiance instructions to monitor for and UTIs.  She was not started back on Tikosyn at this time.  She was discharged to SNF on 05/08/2023.  During today's visit the patient reports*** .  Patient denies chest pain, palpitations, dyspnea, PND, orthopnea, nausea, vomiting, dizziness, syncope, edema, weight gain, or early satiety.  ***Notes: Need updated BMET -Last ischemic evaluation: -Last echo: -Interim ED visits: Review of Systems  Please see the history of present illness.    All other systems reviewed and are otherwise negative except as noted above.  Physical Exam    Wt Readings from Last 3 Encounters:  05/08/23 279 lb 15.8 oz (127 kg)  04/24/21 285 lb 15 oz (129.7 kg)  03/13/21 (!) 308 lb 10.3 oz (140 kg)   GN:FAOZH were no vitals filed for this visit.,There is no height or weight on file to calculate BMI. GEN: Well nourished, well developed in no acute distress Neck: No JVD; No carotid bruits Pulmonary: Clear to auscultation without rales, wheezing or rhonchi  Cardiovascular: Normal rate. Regular rhythm. Normal S1. Normal S2.   Murmurs: There is no murmur.  ABDOMEN: Soft, non-tender, non-distended EXTREMITIES:  No edema; No deformity   EKG/LABS/ Recent Cardiac Studies   ECG personally reviewed by me today - ***  Risk Assessment/Calculations:   {Does this patient have ATRIAL FIBRILLATION?:[id number]}  Lab Results  Component Value Date   WBC 5.7 05/07/2023   HGB 9.4 (L) 05/07/2023   HCT 32.1 (L) 05/07/2023   MCV 93.6 05/07/2023   PLT 243 05/07/2023   Lab Results  Component Value Date   CREATININE 1.08 (H) 05/08/2023   BUN 35 (H) 05/08/2023   NA 138 05/08/2023   K 4.2 05/08/2023   CL 95 (L) 05/08/2023   CO2 33 (H) 05/08/2023   Lab Results  Component Value Date   CHOL 74 04/30/2023   HDL 29 (L) 04/30/2023   LDLCALC 35 04/30/2023   TRIG 52 04/30/2023   CHOLHDL 2.6  04/30/2023    Lab Results  Component Value Date   HGBA1C 6.2 (H) 04/28/2023   Assessment & Plan    1.  HFpEF:  2.  Persistent AF:  3.  CKD stage IIIb:  4.  Hyperlipidemia:  5.  OSA:      Disposition: Follow-up with Will Jorja Loa, MD or APP in *** months {Are you ordering a CV Procedure (e.g. stress test, cath, DCCV, TEE, etc)?   Press F2        :[id number]}   Signed, Napoleon Form, Leodis Rains, NP 06/16/2023, 2:05 PM [facility]

## 2023-06-17 ENCOUNTER — Ambulatory Visit: Payer: 59 | Attending: Nurse Practitioner | Admitting: Nurse Practitioner

## 2023-06-17 ENCOUNTER — Ambulatory Visit: Payer: 59 | Admitting: Nurse Practitioner

## 2023-06-17 DIAGNOSIS — L89323 Pressure ulcer of left buttock, stage 3: Secondary | ICD-10-CM | POA: Diagnosis not present

## 2023-06-17 DIAGNOSIS — L97821 Non-pressure chronic ulcer of other part of left lower leg limited to breakdown of skin: Secondary | ICD-10-CM | POA: Diagnosis not present

## 2023-06-17 DIAGNOSIS — N1832 Chronic kidney disease, stage 3b: Secondary | ICD-10-CM | POA: Diagnosis not present

## 2023-06-17 DIAGNOSIS — I4819 Other persistent atrial fibrillation: Secondary | ICD-10-CM | POA: Diagnosis not present

## 2023-06-17 DIAGNOSIS — I5032 Chronic diastolic (congestive) heart failure: Secondary | ICD-10-CM

## 2023-06-17 DIAGNOSIS — Z7901 Long term (current) use of anticoagulants: Secondary | ICD-10-CM | POA: Diagnosis not present

## 2023-06-17 DIAGNOSIS — Z7984 Long term (current) use of oral hypoglycemic drugs: Secondary | ICD-10-CM | POA: Diagnosis not present

## 2023-06-17 DIAGNOSIS — E1122 Type 2 diabetes mellitus with diabetic chronic kidney disease: Secondary | ICD-10-CM | POA: Diagnosis not present

## 2023-06-17 DIAGNOSIS — G4733 Obstructive sleep apnea (adult) (pediatric): Secondary | ICD-10-CM

## 2023-06-17 DIAGNOSIS — G9341 Metabolic encephalopathy: Secondary | ICD-10-CM | POA: Diagnosis not present

## 2023-06-17 DIAGNOSIS — I5033 Acute on chronic diastolic (congestive) heart failure: Secondary | ICD-10-CM | POA: Diagnosis not present

## 2023-06-17 DIAGNOSIS — L97811 Non-pressure chronic ulcer of other part of right lower leg limited to breakdown of skin: Secondary | ICD-10-CM | POA: Diagnosis not present

## 2023-06-17 DIAGNOSIS — E785 Hyperlipidemia, unspecified: Secondary | ICD-10-CM

## 2023-06-17 DIAGNOSIS — I87313 Chronic venous hypertension (idiopathic) with ulcer of bilateral lower extremity: Secondary | ICD-10-CM | POA: Diagnosis not present

## 2023-06-17 DIAGNOSIS — I130 Hypertensive heart and chronic kidney disease with heart failure and stage 1 through stage 4 chronic kidney disease, or unspecified chronic kidney disease: Secondary | ICD-10-CM | POA: Diagnosis not present

## 2023-06-18 DIAGNOSIS — L97821 Non-pressure chronic ulcer of other part of left lower leg limited to breakdown of skin: Secondary | ICD-10-CM | POA: Diagnosis not present

## 2023-06-18 DIAGNOSIS — I4819 Other persistent atrial fibrillation: Secondary | ICD-10-CM | POA: Diagnosis not present

## 2023-06-18 DIAGNOSIS — Z7901 Long term (current) use of anticoagulants: Secondary | ICD-10-CM | POA: Diagnosis not present

## 2023-06-18 DIAGNOSIS — N1832 Chronic kidney disease, stage 3b: Secondary | ICD-10-CM | POA: Diagnosis not present

## 2023-06-18 DIAGNOSIS — E1122 Type 2 diabetes mellitus with diabetic chronic kidney disease: Secondary | ICD-10-CM | POA: Diagnosis not present

## 2023-06-18 DIAGNOSIS — I130 Hypertensive heart and chronic kidney disease with heart failure and stage 1 through stage 4 chronic kidney disease, or unspecified chronic kidney disease: Secondary | ICD-10-CM | POA: Diagnosis not present

## 2023-06-18 DIAGNOSIS — Z7984 Long term (current) use of oral hypoglycemic drugs: Secondary | ICD-10-CM | POA: Diagnosis not present

## 2023-06-18 DIAGNOSIS — L97811 Non-pressure chronic ulcer of other part of right lower leg limited to breakdown of skin: Secondary | ICD-10-CM | POA: Diagnosis not present

## 2023-06-18 DIAGNOSIS — I5033 Acute on chronic diastolic (congestive) heart failure: Secondary | ICD-10-CM | POA: Diagnosis not present

## 2023-06-18 DIAGNOSIS — I87313 Chronic venous hypertension (idiopathic) with ulcer of bilateral lower extremity: Secondary | ICD-10-CM | POA: Diagnosis not present

## 2023-06-22 DIAGNOSIS — I130 Hypertensive heart and chronic kidney disease with heart failure and stage 1 through stage 4 chronic kidney disease, or unspecified chronic kidney disease: Secondary | ICD-10-CM | POA: Diagnosis not present

## 2023-06-22 DIAGNOSIS — I87311 Chronic venous hypertension (idiopathic) with ulcer of right lower extremity: Secondary | ICD-10-CM | POA: Diagnosis not present

## 2023-06-22 DIAGNOSIS — N1832 Chronic kidney disease, stage 3b: Secondary | ICD-10-CM | POA: Diagnosis not present

## 2023-06-22 DIAGNOSIS — I4819 Other persistent atrial fibrillation: Secondary | ICD-10-CM | POA: Diagnosis not present

## 2023-06-22 DIAGNOSIS — Z7984 Long term (current) use of oral hypoglycemic drugs: Secondary | ICD-10-CM | POA: Diagnosis not present

## 2023-06-22 DIAGNOSIS — E1122 Type 2 diabetes mellitus with diabetic chronic kidney disease: Secondary | ICD-10-CM | POA: Diagnosis not present

## 2023-06-22 DIAGNOSIS — I87313 Chronic venous hypertension (idiopathic) with ulcer of bilateral lower extremity: Secondary | ICD-10-CM | POA: Diagnosis not present

## 2023-06-22 DIAGNOSIS — Z7901 Long term (current) use of anticoagulants: Secondary | ICD-10-CM | POA: Diagnosis not present

## 2023-06-22 DIAGNOSIS — I5033 Acute on chronic diastolic (congestive) heart failure: Secondary | ICD-10-CM | POA: Diagnosis not present

## 2023-06-22 DIAGNOSIS — L97811 Non-pressure chronic ulcer of other part of right lower leg limited to breakdown of skin: Secondary | ICD-10-CM | POA: Diagnosis not present

## 2023-06-22 DIAGNOSIS — L97821 Non-pressure chronic ulcer of other part of left lower leg limited to breakdown of skin: Secondary | ICD-10-CM | POA: Diagnosis not present

## 2023-06-22 DIAGNOSIS — L89223 Pressure ulcer of left hip, stage 3: Secondary | ICD-10-CM | POA: Diagnosis not present

## 2023-06-26 DIAGNOSIS — Z9989 Dependence on other enabling machines and devices: Secondary | ICD-10-CM | POA: Diagnosis not present

## 2023-06-26 DIAGNOSIS — I130 Hypertensive heart and chronic kidney disease with heart failure and stage 1 through stage 4 chronic kidney disease, or unspecified chronic kidney disease: Secondary | ICD-10-CM | POA: Diagnosis not present

## 2023-06-26 DIAGNOSIS — L97821 Non-pressure chronic ulcer of other part of left lower leg limited to breakdown of skin: Secondary | ICD-10-CM | POA: Diagnosis not present

## 2023-06-26 DIAGNOSIS — L97811 Non-pressure chronic ulcer of other part of right lower leg limited to breakdown of skin: Secondary | ICD-10-CM | POA: Diagnosis not present

## 2023-06-26 DIAGNOSIS — Z9181 History of falling: Secondary | ICD-10-CM | POA: Diagnosis not present

## 2023-06-26 DIAGNOSIS — N1832 Chronic kidney disease, stage 3b: Secondary | ICD-10-CM | POA: Diagnosis not present

## 2023-06-26 DIAGNOSIS — E261 Secondary hyperaldosteronism: Secondary | ICD-10-CM | POA: Diagnosis not present

## 2023-06-26 DIAGNOSIS — E1122 Type 2 diabetes mellitus with diabetic chronic kidney disease: Secondary | ICD-10-CM | POA: Diagnosis not present

## 2023-06-26 DIAGNOSIS — L89223 Pressure ulcer of left hip, stage 3: Secondary | ICD-10-CM | POA: Diagnosis not present

## 2023-06-26 DIAGNOSIS — N184 Chronic kidney disease, stage 4 (severe): Secondary | ICD-10-CM | POA: Diagnosis not present

## 2023-06-26 DIAGNOSIS — I5032 Chronic diastolic (congestive) heart failure: Secondary | ICD-10-CM | POA: Diagnosis not present

## 2023-06-26 DIAGNOSIS — I4819 Other persistent atrial fibrillation: Secondary | ICD-10-CM | POA: Diagnosis not present

## 2023-06-26 DIAGNOSIS — I5033 Acute on chronic diastolic (congestive) heart failure: Secondary | ICD-10-CM | POA: Diagnosis not present

## 2023-06-26 DIAGNOSIS — I87313 Chronic venous hypertension (idiopathic) with ulcer of bilateral lower extremity: Secondary | ICD-10-CM | POA: Diagnosis not present

## 2023-06-26 DIAGNOSIS — Z7901 Long term (current) use of anticoagulants: Secondary | ICD-10-CM | POA: Diagnosis not present

## 2023-06-26 DIAGNOSIS — Z7984 Long term (current) use of oral hypoglycemic drugs: Secondary | ICD-10-CM | POA: Diagnosis not present

## 2023-07-01 DIAGNOSIS — Z7901 Long term (current) use of anticoagulants: Secondary | ICD-10-CM | POA: Diagnosis not present

## 2023-07-01 DIAGNOSIS — I87313 Chronic venous hypertension (idiopathic) with ulcer of bilateral lower extremity: Secondary | ICD-10-CM | POA: Diagnosis not present

## 2023-07-01 DIAGNOSIS — L97821 Non-pressure chronic ulcer of other part of left lower leg limited to breakdown of skin: Secondary | ICD-10-CM | POA: Diagnosis not present

## 2023-07-01 DIAGNOSIS — E1122 Type 2 diabetes mellitus with diabetic chronic kidney disease: Secondary | ICD-10-CM | POA: Diagnosis not present

## 2023-07-01 DIAGNOSIS — N1832 Chronic kidney disease, stage 3b: Secondary | ICD-10-CM | POA: Diagnosis not present

## 2023-07-01 DIAGNOSIS — I4819 Other persistent atrial fibrillation: Secondary | ICD-10-CM | POA: Diagnosis not present

## 2023-07-01 DIAGNOSIS — L97811 Non-pressure chronic ulcer of other part of right lower leg limited to breakdown of skin: Secondary | ICD-10-CM | POA: Diagnosis not present

## 2023-07-01 DIAGNOSIS — I130 Hypertensive heart and chronic kidney disease with heart failure and stage 1 through stage 4 chronic kidney disease, or unspecified chronic kidney disease: Secondary | ICD-10-CM | POA: Diagnosis not present

## 2023-07-01 DIAGNOSIS — I5033 Acute on chronic diastolic (congestive) heart failure: Secondary | ICD-10-CM | POA: Diagnosis not present

## 2023-07-01 DIAGNOSIS — Z7984 Long term (current) use of oral hypoglycemic drugs: Secondary | ICD-10-CM | POA: Diagnosis not present

## 2023-07-02 DIAGNOSIS — L97811 Non-pressure chronic ulcer of other part of right lower leg limited to breakdown of skin: Secondary | ICD-10-CM | POA: Diagnosis not present

## 2023-07-02 DIAGNOSIS — Z7901 Long term (current) use of anticoagulants: Secondary | ICD-10-CM | POA: Diagnosis not present

## 2023-07-02 DIAGNOSIS — N1832 Chronic kidney disease, stage 3b: Secondary | ICD-10-CM | POA: Diagnosis not present

## 2023-07-02 DIAGNOSIS — I87313 Chronic venous hypertension (idiopathic) with ulcer of bilateral lower extremity: Secondary | ICD-10-CM | POA: Diagnosis not present

## 2023-07-02 DIAGNOSIS — I5033 Acute on chronic diastolic (congestive) heart failure: Secondary | ICD-10-CM | POA: Diagnosis not present

## 2023-07-02 DIAGNOSIS — I130 Hypertensive heart and chronic kidney disease with heart failure and stage 1 through stage 4 chronic kidney disease, or unspecified chronic kidney disease: Secondary | ICD-10-CM | POA: Diagnosis not present

## 2023-07-02 DIAGNOSIS — L97821 Non-pressure chronic ulcer of other part of left lower leg limited to breakdown of skin: Secondary | ICD-10-CM | POA: Diagnosis not present

## 2023-07-02 DIAGNOSIS — E1122 Type 2 diabetes mellitus with diabetic chronic kidney disease: Secondary | ICD-10-CM | POA: Diagnosis not present

## 2023-07-02 DIAGNOSIS — Z7984 Long term (current) use of oral hypoglycemic drugs: Secondary | ICD-10-CM | POA: Diagnosis not present

## 2023-07-02 DIAGNOSIS — I4819 Other persistent atrial fibrillation: Secondary | ICD-10-CM | POA: Diagnosis not present

## 2023-07-03 DIAGNOSIS — L97811 Non-pressure chronic ulcer of other part of right lower leg limited to breakdown of skin: Secondary | ICD-10-CM | POA: Diagnosis not present

## 2023-07-03 DIAGNOSIS — N1832 Chronic kidney disease, stage 3b: Secondary | ICD-10-CM | POA: Diagnosis not present

## 2023-07-03 DIAGNOSIS — I130 Hypertensive heart and chronic kidney disease with heart failure and stage 1 through stage 4 chronic kidney disease, or unspecified chronic kidney disease: Secondary | ICD-10-CM | POA: Diagnosis not present

## 2023-07-03 DIAGNOSIS — Z7901 Long term (current) use of anticoagulants: Secondary | ICD-10-CM | POA: Diagnosis not present

## 2023-07-03 DIAGNOSIS — I87313 Chronic venous hypertension (idiopathic) with ulcer of bilateral lower extremity: Secondary | ICD-10-CM | POA: Diagnosis not present

## 2023-07-03 DIAGNOSIS — I4819 Other persistent atrial fibrillation: Secondary | ICD-10-CM | POA: Diagnosis not present

## 2023-07-03 DIAGNOSIS — L97821 Non-pressure chronic ulcer of other part of left lower leg limited to breakdown of skin: Secondary | ICD-10-CM | POA: Diagnosis not present

## 2023-07-03 DIAGNOSIS — I5033 Acute on chronic diastolic (congestive) heart failure: Secondary | ICD-10-CM | POA: Diagnosis not present

## 2023-07-03 DIAGNOSIS — E1122 Type 2 diabetes mellitus with diabetic chronic kidney disease: Secondary | ICD-10-CM | POA: Diagnosis not present

## 2023-07-03 DIAGNOSIS — Z7984 Long term (current) use of oral hypoglycemic drugs: Secondary | ICD-10-CM | POA: Diagnosis not present

## 2023-07-06 DIAGNOSIS — I87313 Chronic venous hypertension (idiopathic) with ulcer of bilateral lower extremity: Secondary | ICD-10-CM | POA: Diagnosis not present

## 2023-07-06 DIAGNOSIS — L97821 Non-pressure chronic ulcer of other part of left lower leg limited to breakdown of skin: Secondary | ICD-10-CM | POA: Diagnosis not present

## 2023-07-06 DIAGNOSIS — I4819 Other persistent atrial fibrillation: Secondary | ICD-10-CM | POA: Diagnosis not present

## 2023-07-06 DIAGNOSIS — E1122 Type 2 diabetes mellitus with diabetic chronic kidney disease: Secondary | ICD-10-CM | POA: Diagnosis not present

## 2023-07-06 DIAGNOSIS — Z7984 Long term (current) use of oral hypoglycemic drugs: Secondary | ICD-10-CM | POA: Diagnosis not present

## 2023-07-06 DIAGNOSIS — I130 Hypertensive heart and chronic kidney disease with heart failure and stage 1 through stage 4 chronic kidney disease, or unspecified chronic kidney disease: Secondary | ICD-10-CM | POA: Diagnosis not present

## 2023-07-06 DIAGNOSIS — Z7901 Long term (current) use of anticoagulants: Secondary | ICD-10-CM | POA: Diagnosis not present

## 2023-07-06 DIAGNOSIS — L97811 Non-pressure chronic ulcer of other part of right lower leg limited to breakdown of skin: Secondary | ICD-10-CM | POA: Diagnosis not present

## 2023-07-06 DIAGNOSIS — N1832 Chronic kidney disease, stage 3b: Secondary | ICD-10-CM | POA: Diagnosis not present

## 2023-07-06 DIAGNOSIS — I5033 Acute on chronic diastolic (congestive) heart failure: Secondary | ICD-10-CM | POA: Diagnosis not present

## 2023-07-07 DIAGNOSIS — L97821 Non-pressure chronic ulcer of other part of left lower leg limited to breakdown of skin: Secondary | ICD-10-CM | POA: Diagnosis not present

## 2023-07-09 DIAGNOSIS — I130 Hypertensive heart and chronic kidney disease with heart failure and stage 1 through stage 4 chronic kidney disease, or unspecified chronic kidney disease: Secondary | ICD-10-CM | POA: Diagnosis not present

## 2023-07-09 DIAGNOSIS — N1832 Chronic kidney disease, stage 3b: Secondary | ICD-10-CM | POA: Diagnosis not present

## 2023-07-09 DIAGNOSIS — I87313 Chronic venous hypertension (idiopathic) with ulcer of bilateral lower extremity: Secondary | ICD-10-CM | POA: Diagnosis not present

## 2023-07-09 DIAGNOSIS — Z7901 Long term (current) use of anticoagulants: Secondary | ICD-10-CM | POA: Diagnosis not present

## 2023-07-09 DIAGNOSIS — I4819 Other persistent atrial fibrillation: Secondary | ICD-10-CM | POA: Diagnosis not present

## 2023-07-09 DIAGNOSIS — I5033 Acute on chronic diastolic (congestive) heart failure: Secondary | ICD-10-CM | POA: Diagnosis not present

## 2023-07-09 DIAGNOSIS — E1122 Type 2 diabetes mellitus with diabetic chronic kidney disease: Secondary | ICD-10-CM | POA: Diagnosis not present

## 2023-07-09 DIAGNOSIS — L97811 Non-pressure chronic ulcer of other part of right lower leg limited to breakdown of skin: Secondary | ICD-10-CM | POA: Diagnosis not present

## 2023-07-09 DIAGNOSIS — Z7984 Long term (current) use of oral hypoglycemic drugs: Secondary | ICD-10-CM | POA: Diagnosis not present

## 2023-07-09 DIAGNOSIS — L97821 Non-pressure chronic ulcer of other part of left lower leg limited to breakdown of skin: Secondary | ICD-10-CM | POA: Diagnosis not present

## 2023-07-13 DIAGNOSIS — L97811 Non-pressure chronic ulcer of other part of right lower leg limited to breakdown of skin: Secondary | ICD-10-CM | POA: Diagnosis not present

## 2023-07-13 DIAGNOSIS — L89223 Pressure ulcer of left hip, stage 3: Secondary | ICD-10-CM | POA: Diagnosis not present

## 2023-07-13 DIAGNOSIS — L97821 Non-pressure chronic ulcer of other part of left lower leg limited to breakdown of skin: Secondary | ICD-10-CM | POA: Diagnosis not present

## 2023-07-14 DIAGNOSIS — L97811 Non-pressure chronic ulcer of other part of right lower leg limited to breakdown of skin: Secondary | ICD-10-CM | POA: Diagnosis not present

## 2023-07-14 DIAGNOSIS — I5033 Acute on chronic diastolic (congestive) heart failure: Secondary | ICD-10-CM | POA: Diagnosis not present

## 2023-07-14 DIAGNOSIS — E1122 Type 2 diabetes mellitus with diabetic chronic kidney disease: Secondary | ICD-10-CM | POA: Diagnosis not present

## 2023-07-14 DIAGNOSIS — I87313 Chronic venous hypertension (idiopathic) with ulcer of bilateral lower extremity: Secondary | ICD-10-CM | POA: Diagnosis not present

## 2023-07-14 DIAGNOSIS — N1832 Chronic kidney disease, stage 3b: Secondary | ICD-10-CM | POA: Diagnosis not present

## 2023-07-14 DIAGNOSIS — Z7984 Long term (current) use of oral hypoglycemic drugs: Secondary | ICD-10-CM | POA: Diagnosis not present

## 2023-07-14 DIAGNOSIS — L97821 Non-pressure chronic ulcer of other part of left lower leg limited to breakdown of skin: Secondary | ICD-10-CM | POA: Diagnosis not present

## 2023-07-14 DIAGNOSIS — Z7901 Long term (current) use of anticoagulants: Secondary | ICD-10-CM | POA: Diagnosis not present

## 2023-07-14 DIAGNOSIS — I130 Hypertensive heart and chronic kidney disease with heart failure and stage 1 through stage 4 chronic kidney disease, or unspecified chronic kidney disease: Secondary | ICD-10-CM | POA: Diagnosis not present

## 2023-07-14 DIAGNOSIS — I4819 Other persistent atrial fibrillation: Secondary | ICD-10-CM | POA: Diagnosis not present

## 2023-07-15 DIAGNOSIS — J309 Allergic rhinitis, unspecified: Secondary | ICD-10-CM | POA: Diagnosis not present

## 2023-07-15 DIAGNOSIS — R2681 Unsteadiness on feet: Secondary | ICD-10-CM | POA: Diagnosis not present

## 2023-07-15 DIAGNOSIS — E039 Hypothyroidism, unspecified: Secondary | ICD-10-CM | POA: Diagnosis not present

## 2023-07-15 DIAGNOSIS — I5033 Acute on chronic diastolic (congestive) heart failure: Secondary | ICD-10-CM | POA: Diagnosis not present

## 2023-07-15 DIAGNOSIS — M6281 Muscle weakness (generalized): Secondary | ICD-10-CM | POA: Diagnosis not present

## 2023-07-16 DIAGNOSIS — I87313 Chronic venous hypertension (idiopathic) with ulcer of bilateral lower extremity: Secondary | ICD-10-CM | POA: Diagnosis not present

## 2023-07-16 DIAGNOSIS — Z7984 Long term (current) use of oral hypoglycemic drugs: Secondary | ICD-10-CM | POA: Diagnosis not present

## 2023-07-16 DIAGNOSIS — N1832 Chronic kidney disease, stage 3b: Secondary | ICD-10-CM | POA: Diagnosis not present

## 2023-07-16 DIAGNOSIS — I5033 Acute on chronic diastolic (congestive) heart failure: Secondary | ICD-10-CM | POA: Diagnosis not present

## 2023-07-16 DIAGNOSIS — I130 Hypertensive heart and chronic kidney disease with heart failure and stage 1 through stage 4 chronic kidney disease, or unspecified chronic kidney disease: Secondary | ICD-10-CM | POA: Diagnosis not present

## 2023-07-16 DIAGNOSIS — Z7901 Long term (current) use of anticoagulants: Secondary | ICD-10-CM | POA: Diagnosis not present

## 2023-07-16 DIAGNOSIS — E1122 Type 2 diabetes mellitus with diabetic chronic kidney disease: Secondary | ICD-10-CM | POA: Diagnosis not present

## 2023-07-16 DIAGNOSIS — L97821 Non-pressure chronic ulcer of other part of left lower leg limited to breakdown of skin: Secondary | ICD-10-CM | POA: Diagnosis not present

## 2023-07-16 DIAGNOSIS — I4819 Other persistent atrial fibrillation: Secondary | ICD-10-CM | POA: Diagnosis not present

## 2023-07-16 DIAGNOSIS — L97811 Non-pressure chronic ulcer of other part of right lower leg limited to breakdown of skin: Secondary | ICD-10-CM | POA: Diagnosis not present

## 2023-07-22 DIAGNOSIS — E1122 Type 2 diabetes mellitus with diabetic chronic kidney disease: Secondary | ICD-10-CM | POA: Diagnosis not present

## 2023-07-22 DIAGNOSIS — Z7901 Long term (current) use of anticoagulants: Secondary | ICD-10-CM | POA: Diagnosis not present

## 2023-07-22 DIAGNOSIS — N1832 Chronic kidney disease, stage 3b: Secondary | ICD-10-CM | POA: Diagnosis not present

## 2023-07-22 DIAGNOSIS — I130 Hypertensive heart and chronic kidney disease with heart failure and stage 1 through stage 4 chronic kidney disease, or unspecified chronic kidney disease: Secondary | ICD-10-CM | POA: Diagnosis not present

## 2023-07-22 DIAGNOSIS — Z7984 Long term (current) use of oral hypoglycemic drugs: Secondary | ICD-10-CM | POA: Diagnosis not present

## 2023-07-22 DIAGNOSIS — L97811 Non-pressure chronic ulcer of other part of right lower leg limited to breakdown of skin: Secondary | ICD-10-CM | POA: Diagnosis not present

## 2023-07-22 DIAGNOSIS — I4819 Other persistent atrial fibrillation: Secondary | ICD-10-CM | POA: Diagnosis not present

## 2023-07-22 DIAGNOSIS — L97821 Non-pressure chronic ulcer of other part of left lower leg limited to breakdown of skin: Secondary | ICD-10-CM | POA: Diagnosis not present

## 2023-07-22 DIAGNOSIS — I87313 Chronic venous hypertension (idiopathic) with ulcer of bilateral lower extremity: Secondary | ICD-10-CM | POA: Diagnosis not present

## 2023-07-22 DIAGNOSIS — I5033 Acute on chronic diastolic (congestive) heart failure: Secondary | ICD-10-CM | POA: Diagnosis not present

## 2023-07-23 DIAGNOSIS — N1832 Chronic kidney disease, stage 3b: Secondary | ICD-10-CM | POA: Diagnosis not present

## 2023-07-23 DIAGNOSIS — I130 Hypertensive heart and chronic kidney disease with heart failure and stage 1 through stage 4 chronic kidney disease, or unspecified chronic kidney disease: Secondary | ICD-10-CM | POA: Diagnosis not present

## 2023-07-23 DIAGNOSIS — I5033 Acute on chronic diastolic (congestive) heart failure: Secondary | ICD-10-CM | POA: Diagnosis not present

## 2023-07-23 DIAGNOSIS — I87313 Chronic venous hypertension (idiopathic) with ulcer of bilateral lower extremity: Secondary | ICD-10-CM | POA: Diagnosis not present

## 2023-07-23 DIAGNOSIS — Z7901 Long term (current) use of anticoagulants: Secondary | ICD-10-CM | POA: Diagnosis not present

## 2023-07-23 DIAGNOSIS — Z7984 Long term (current) use of oral hypoglycemic drugs: Secondary | ICD-10-CM | POA: Diagnosis not present

## 2023-07-23 DIAGNOSIS — L97821 Non-pressure chronic ulcer of other part of left lower leg limited to breakdown of skin: Secondary | ICD-10-CM | POA: Diagnosis not present

## 2023-07-23 DIAGNOSIS — L97811 Non-pressure chronic ulcer of other part of right lower leg limited to breakdown of skin: Secondary | ICD-10-CM | POA: Diagnosis not present

## 2023-07-23 DIAGNOSIS — I4819 Other persistent atrial fibrillation: Secondary | ICD-10-CM | POA: Diagnosis not present

## 2023-07-23 DIAGNOSIS — E1122 Type 2 diabetes mellitus with diabetic chronic kidney disease: Secondary | ICD-10-CM | POA: Diagnosis not present

## 2023-07-30 DIAGNOSIS — Z7984 Long term (current) use of oral hypoglycemic drugs: Secondary | ICD-10-CM | POA: Diagnosis not present

## 2023-07-30 DIAGNOSIS — I130 Hypertensive heart and chronic kidney disease with heart failure and stage 1 through stage 4 chronic kidney disease, or unspecified chronic kidney disease: Secondary | ICD-10-CM | POA: Diagnosis not present

## 2023-07-30 DIAGNOSIS — Z7901 Long term (current) use of anticoagulants: Secondary | ICD-10-CM | POA: Diagnosis not present

## 2023-07-30 DIAGNOSIS — I87313 Chronic venous hypertension (idiopathic) with ulcer of bilateral lower extremity: Secondary | ICD-10-CM | POA: Diagnosis not present

## 2023-07-30 DIAGNOSIS — L97821 Non-pressure chronic ulcer of other part of left lower leg limited to breakdown of skin: Secondary | ICD-10-CM | POA: Diagnosis not present

## 2023-07-30 DIAGNOSIS — L97811 Non-pressure chronic ulcer of other part of right lower leg limited to breakdown of skin: Secondary | ICD-10-CM | POA: Diagnosis not present

## 2023-07-30 DIAGNOSIS — I5033 Acute on chronic diastolic (congestive) heart failure: Secondary | ICD-10-CM | POA: Diagnosis not present

## 2023-07-30 DIAGNOSIS — E1122 Type 2 diabetes mellitus with diabetic chronic kidney disease: Secondary | ICD-10-CM | POA: Diagnosis not present

## 2023-07-30 DIAGNOSIS — N1832 Chronic kidney disease, stage 3b: Secondary | ICD-10-CM | POA: Diagnosis not present

## 2023-07-30 DIAGNOSIS — I4819 Other persistent atrial fibrillation: Secondary | ICD-10-CM | POA: Diagnosis not present

## 2023-07-31 DIAGNOSIS — I4819 Other persistent atrial fibrillation: Secondary | ICD-10-CM | POA: Diagnosis not present

## 2023-07-31 DIAGNOSIS — I87313 Chronic venous hypertension (idiopathic) with ulcer of bilateral lower extremity: Secondary | ICD-10-CM | POA: Diagnosis not present

## 2023-07-31 DIAGNOSIS — Z7984 Long term (current) use of oral hypoglycemic drugs: Secondary | ICD-10-CM | POA: Diagnosis not present

## 2023-07-31 DIAGNOSIS — I5033 Acute on chronic diastolic (congestive) heart failure: Secondary | ICD-10-CM | POA: Diagnosis not present

## 2023-07-31 DIAGNOSIS — E1122 Type 2 diabetes mellitus with diabetic chronic kidney disease: Secondary | ICD-10-CM | POA: Diagnosis not present

## 2023-07-31 DIAGNOSIS — L97811 Non-pressure chronic ulcer of other part of right lower leg limited to breakdown of skin: Secondary | ICD-10-CM | POA: Diagnosis not present

## 2023-07-31 DIAGNOSIS — I130 Hypertensive heart and chronic kidney disease with heart failure and stage 1 through stage 4 chronic kidney disease, or unspecified chronic kidney disease: Secondary | ICD-10-CM | POA: Diagnosis not present

## 2023-07-31 DIAGNOSIS — L97821 Non-pressure chronic ulcer of other part of left lower leg limited to breakdown of skin: Secondary | ICD-10-CM | POA: Diagnosis not present

## 2023-07-31 DIAGNOSIS — N1832 Chronic kidney disease, stage 3b: Secondary | ICD-10-CM | POA: Diagnosis not present

## 2023-07-31 DIAGNOSIS — Z7901 Long term (current) use of anticoagulants: Secondary | ICD-10-CM | POA: Diagnosis not present

## 2023-08-04 DIAGNOSIS — L97821 Non-pressure chronic ulcer of other part of left lower leg limited to breakdown of skin: Secondary | ICD-10-CM | POA: Diagnosis not present

## 2023-08-04 DIAGNOSIS — I4819 Other persistent atrial fibrillation: Secondary | ICD-10-CM | POA: Diagnosis not present

## 2023-08-04 DIAGNOSIS — Z7984 Long term (current) use of oral hypoglycemic drugs: Secondary | ICD-10-CM | POA: Diagnosis not present

## 2023-08-04 DIAGNOSIS — E1122 Type 2 diabetes mellitus with diabetic chronic kidney disease: Secondary | ICD-10-CM | POA: Diagnosis not present

## 2023-08-04 DIAGNOSIS — I5033 Acute on chronic diastolic (congestive) heart failure: Secondary | ICD-10-CM | POA: Diagnosis not present

## 2023-08-04 DIAGNOSIS — I130 Hypertensive heart and chronic kidney disease with heart failure and stage 1 through stage 4 chronic kidney disease, or unspecified chronic kidney disease: Secondary | ICD-10-CM | POA: Diagnosis not present

## 2023-08-04 DIAGNOSIS — N1832 Chronic kidney disease, stage 3b: Secondary | ICD-10-CM | POA: Diagnosis not present

## 2023-08-04 DIAGNOSIS — I87313 Chronic venous hypertension (idiopathic) with ulcer of bilateral lower extremity: Secondary | ICD-10-CM | POA: Diagnosis not present

## 2023-08-04 DIAGNOSIS — Z7901 Long term (current) use of anticoagulants: Secondary | ICD-10-CM | POA: Diagnosis not present

## 2023-08-04 DIAGNOSIS — L97811 Non-pressure chronic ulcer of other part of right lower leg limited to breakdown of skin: Secondary | ICD-10-CM | POA: Diagnosis not present

## 2023-08-06 DIAGNOSIS — N1832 Chronic kidney disease, stage 3b: Secondary | ICD-10-CM | POA: Diagnosis not present

## 2023-08-06 DIAGNOSIS — E1122 Type 2 diabetes mellitus with diabetic chronic kidney disease: Secondary | ICD-10-CM | POA: Diagnosis not present

## 2023-08-06 DIAGNOSIS — I4819 Other persistent atrial fibrillation: Secondary | ICD-10-CM | POA: Diagnosis not present

## 2023-08-06 DIAGNOSIS — Z7901 Long term (current) use of anticoagulants: Secondary | ICD-10-CM | POA: Diagnosis not present

## 2023-08-06 DIAGNOSIS — I87313 Chronic venous hypertension (idiopathic) with ulcer of bilateral lower extremity: Secondary | ICD-10-CM | POA: Diagnosis not present

## 2023-08-06 DIAGNOSIS — I130 Hypertensive heart and chronic kidney disease with heart failure and stage 1 through stage 4 chronic kidney disease, or unspecified chronic kidney disease: Secondary | ICD-10-CM | POA: Diagnosis not present

## 2023-08-06 DIAGNOSIS — Z7984 Long term (current) use of oral hypoglycemic drugs: Secondary | ICD-10-CM | POA: Diagnosis not present

## 2023-08-06 DIAGNOSIS — L97811 Non-pressure chronic ulcer of other part of right lower leg limited to breakdown of skin: Secondary | ICD-10-CM | POA: Diagnosis not present

## 2023-08-06 DIAGNOSIS — L97821 Non-pressure chronic ulcer of other part of left lower leg limited to breakdown of skin: Secondary | ICD-10-CM | POA: Diagnosis not present

## 2023-08-06 DIAGNOSIS — I5033 Acute on chronic diastolic (congestive) heart failure: Secondary | ICD-10-CM | POA: Diagnosis not present

## 2023-08-07 DIAGNOSIS — L97811 Non-pressure chronic ulcer of other part of right lower leg limited to breakdown of skin: Secondary | ICD-10-CM | POA: Diagnosis not present

## 2023-08-07 DIAGNOSIS — I130 Hypertensive heart and chronic kidney disease with heart failure and stage 1 through stage 4 chronic kidney disease, or unspecified chronic kidney disease: Secondary | ICD-10-CM | POA: Diagnosis not present

## 2023-08-07 DIAGNOSIS — L97821 Non-pressure chronic ulcer of other part of left lower leg limited to breakdown of skin: Secondary | ICD-10-CM | POA: Diagnosis not present

## 2023-08-07 DIAGNOSIS — I87313 Chronic venous hypertension (idiopathic) with ulcer of bilateral lower extremity: Secondary | ICD-10-CM | POA: Diagnosis not present

## 2023-08-07 DIAGNOSIS — N1832 Chronic kidney disease, stage 3b: Secondary | ICD-10-CM | POA: Diagnosis not present

## 2023-08-07 DIAGNOSIS — I4819 Other persistent atrial fibrillation: Secondary | ICD-10-CM | POA: Diagnosis not present

## 2023-08-07 DIAGNOSIS — Z7901 Long term (current) use of anticoagulants: Secondary | ICD-10-CM | POA: Diagnosis not present

## 2023-08-07 DIAGNOSIS — I5033 Acute on chronic diastolic (congestive) heart failure: Secondary | ICD-10-CM | POA: Diagnosis not present

## 2023-08-07 DIAGNOSIS — Z7984 Long term (current) use of oral hypoglycemic drugs: Secondary | ICD-10-CM | POA: Diagnosis not present

## 2023-08-07 DIAGNOSIS — E1122 Type 2 diabetes mellitus with diabetic chronic kidney disease: Secondary | ICD-10-CM | POA: Diagnosis not present

## 2023-08-10 DIAGNOSIS — L97821 Non-pressure chronic ulcer of other part of left lower leg limited to breakdown of skin: Secondary | ICD-10-CM | POA: Diagnosis not present

## 2023-08-10 DIAGNOSIS — L97811 Non-pressure chronic ulcer of other part of right lower leg limited to breakdown of skin: Secondary | ICD-10-CM | POA: Diagnosis not present

## 2023-08-10 DIAGNOSIS — L89223 Pressure ulcer of left hip, stage 3: Secondary | ICD-10-CM | POA: Diagnosis not present

## 2023-08-12 DIAGNOSIS — L97811 Non-pressure chronic ulcer of other part of right lower leg limited to breakdown of skin: Secondary | ICD-10-CM | POA: Diagnosis not present

## 2023-08-12 DIAGNOSIS — I87313 Chronic venous hypertension (idiopathic) with ulcer of bilateral lower extremity: Secondary | ICD-10-CM | POA: Diagnosis not present

## 2023-08-12 DIAGNOSIS — I4819 Other persistent atrial fibrillation: Secondary | ICD-10-CM | POA: Diagnosis not present

## 2023-08-12 DIAGNOSIS — Z7984 Long term (current) use of oral hypoglycemic drugs: Secondary | ICD-10-CM | POA: Diagnosis not present

## 2023-08-12 DIAGNOSIS — Z7901 Long term (current) use of anticoagulants: Secondary | ICD-10-CM | POA: Diagnosis not present

## 2023-08-12 DIAGNOSIS — I5033 Acute on chronic diastolic (congestive) heart failure: Secondary | ICD-10-CM | POA: Diagnosis not present

## 2023-08-12 DIAGNOSIS — I130 Hypertensive heart and chronic kidney disease with heart failure and stage 1 through stage 4 chronic kidney disease, or unspecified chronic kidney disease: Secondary | ICD-10-CM | POA: Diagnosis not present

## 2023-08-12 DIAGNOSIS — E1122 Type 2 diabetes mellitus with diabetic chronic kidney disease: Secondary | ICD-10-CM | POA: Diagnosis not present

## 2023-08-12 DIAGNOSIS — N1832 Chronic kidney disease, stage 3b: Secondary | ICD-10-CM | POA: Diagnosis not present

## 2023-08-12 DIAGNOSIS — L97821 Non-pressure chronic ulcer of other part of left lower leg limited to breakdown of skin: Secondary | ICD-10-CM | POA: Diagnosis not present

## 2023-08-13 DIAGNOSIS — L97821 Non-pressure chronic ulcer of other part of left lower leg limited to breakdown of skin: Secondary | ICD-10-CM | POA: Diagnosis not present

## 2023-08-13 DIAGNOSIS — I130 Hypertensive heart and chronic kidney disease with heart failure and stage 1 through stage 4 chronic kidney disease, or unspecified chronic kidney disease: Secondary | ICD-10-CM | POA: Diagnosis not present

## 2023-08-13 DIAGNOSIS — Z7984 Long term (current) use of oral hypoglycemic drugs: Secondary | ICD-10-CM | POA: Diagnosis not present

## 2023-08-13 DIAGNOSIS — N1832 Chronic kidney disease, stage 3b: Secondary | ICD-10-CM | POA: Diagnosis not present

## 2023-08-13 DIAGNOSIS — Z7901 Long term (current) use of anticoagulants: Secondary | ICD-10-CM | POA: Diagnosis not present

## 2023-08-13 DIAGNOSIS — L97811 Non-pressure chronic ulcer of other part of right lower leg limited to breakdown of skin: Secondary | ICD-10-CM | POA: Diagnosis not present

## 2023-08-13 DIAGNOSIS — I5033 Acute on chronic diastolic (congestive) heart failure: Secondary | ICD-10-CM | POA: Diagnosis not present

## 2023-08-13 DIAGNOSIS — I4819 Other persistent atrial fibrillation: Secondary | ICD-10-CM | POA: Diagnosis not present

## 2023-08-13 DIAGNOSIS — E1122 Type 2 diabetes mellitus with diabetic chronic kidney disease: Secondary | ICD-10-CM | POA: Diagnosis not present

## 2023-08-13 DIAGNOSIS — I87313 Chronic venous hypertension (idiopathic) with ulcer of bilateral lower extremity: Secondary | ICD-10-CM | POA: Diagnosis not present

## 2023-08-15 DIAGNOSIS — L97821 Non-pressure chronic ulcer of other part of left lower leg limited to breakdown of skin: Secondary | ICD-10-CM | POA: Diagnosis not present

## 2023-08-16 DIAGNOSIS — I4819 Other persistent atrial fibrillation: Secondary | ICD-10-CM | POA: Diagnosis not present

## 2023-08-16 DIAGNOSIS — Z7901 Long term (current) use of anticoagulants: Secondary | ICD-10-CM | POA: Diagnosis not present

## 2023-08-16 DIAGNOSIS — I5033 Acute on chronic diastolic (congestive) heart failure: Secondary | ICD-10-CM | POA: Diagnosis not present

## 2023-08-16 DIAGNOSIS — I87313 Chronic venous hypertension (idiopathic) with ulcer of bilateral lower extremity: Secondary | ICD-10-CM | POA: Diagnosis not present

## 2023-08-16 DIAGNOSIS — L97811 Non-pressure chronic ulcer of other part of right lower leg limited to breakdown of skin: Secondary | ICD-10-CM | POA: Diagnosis not present

## 2023-08-16 DIAGNOSIS — Z7984 Long term (current) use of oral hypoglycemic drugs: Secondary | ICD-10-CM | POA: Diagnosis not present

## 2023-08-16 DIAGNOSIS — E1122 Type 2 diabetes mellitus with diabetic chronic kidney disease: Secondary | ICD-10-CM | POA: Diagnosis not present

## 2023-08-16 DIAGNOSIS — I130 Hypertensive heart and chronic kidney disease with heart failure and stage 1 through stage 4 chronic kidney disease, or unspecified chronic kidney disease: Secondary | ICD-10-CM | POA: Diagnosis not present

## 2023-08-16 DIAGNOSIS — L97821 Non-pressure chronic ulcer of other part of left lower leg limited to breakdown of skin: Secondary | ICD-10-CM | POA: Diagnosis not present

## 2023-08-16 DIAGNOSIS — D631 Anemia in chronic kidney disease: Secondary | ICD-10-CM | POA: Diagnosis not present

## 2023-08-16 DIAGNOSIS — L89323 Pressure ulcer of left buttock, stage 3: Secondary | ICD-10-CM | POA: Diagnosis not present

## 2023-08-17 DIAGNOSIS — E11621 Type 2 diabetes mellitus with foot ulcer: Secondary | ICD-10-CM | POA: Diagnosis not present

## 2023-08-17 DIAGNOSIS — E261 Secondary hyperaldosteronism: Secondary | ICD-10-CM | POA: Diagnosis not present

## 2023-08-17 DIAGNOSIS — N184 Chronic kidney disease, stage 4 (severe): Secondary | ICD-10-CM | POA: Diagnosis not present

## 2023-08-17 DIAGNOSIS — I5032 Chronic diastolic (congestive) heart failure: Secondary | ICD-10-CM | POA: Diagnosis not present

## 2023-08-19 DIAGNOSIS — I87313 Chronic venous hypertension (idiopathic) with ulcer of bilateral lower extremity: Secondary | ICD-10-CM | POA: Diagnosis not present

## 2023-08-19 DIAGNOSIS — Z7984 Long term (current) use of oral hypoglycemic drugs: Secondary | ICD-10-CM | POA: Diagnosis not present

## 2023-08-19 DIAGNOSIS — I4819 Other persistent atrial fibrillation: Secondary | ICD-10-CM | POA: Diagnosis not present

## 2023-08-19 DIAGNOSIS — L97811 Non-pressure chronic ulcer of other part of right lower leg limited to breakdown of skin: Secondary | ICD-10-CM | POA: Diagnosis not present

## 2023-08-19 DIAGNOSIS — L97821 Non-pressure chronic ulcer of other part of left lower leg limited to breakdown of skin: Secondary | ICD-10-CM | POA: Diagnosis not present

## 2023-08-19 DIAGNOSIS — N1832 Chronic kidney disease, stage 3b: Secondary | ICD-10-CM | POA: Diagnosis not present

## 2023-08-19 DIAGNOSIS — I5033 Acute on chronic diastolic (congestive) heart failure: Secondary | ICD-10-CM | POA: Diagnosis not present

## 2023-08-19 DIAGNOSIS — E1122 Type 2 diabetes mellitus with diabetic chronic kidney disease: Secondary | ICD-10-CM | POA: Diagnosis not present

## 2023-08-19 DIAGNOSIS — I130 Hypertensive heart and chronic kidney disease with heart failure and stage 1 through stage 4 chronic kidney disease, or unspecified chronic kidney disease: Secondary | ICD-10-CM | POA: Diagnosis not present

## 2023-08-19 DIAGNOSIS — D631 Anemia in chronic kidney disease: Secondary | ICD-10-CM | POA: Diagnosis not present

## 2023-08-20 DIAGNOSIS — I4819 Other persistent atrial fibrillation: Secondary | ICD-10-CM | POA: Diagnosis not present

## 2023-08-20 DIAGNOSIS — L97811 Non-pressure chronic ulcer of other part of right lower leg limited to breakdown of skin: Secondary | ICD-10-CM | POA: Diagnosis not present

## 2023-08-20 DIAGNOSIS — I5033 Acute on chronic diastolic (congestive) heart failure: Secondary | ICD-10-CM | POA: Diagnosis not present

## 2023-08-20 DIAGNOSIS — L97821 Non-pressure chronic ulcer of other part of left lower leg limited to breakdown of skin: Secondary | ICD-10-CM | POA: Diagnosis not present

## 2023-08-20 DIAGNOSIS — N1832 Chronic kidney disease, stage 3b: Secondary | ICD-10-CM | POA: Diagnosis not present

## 2023-08-20 DIAGNOSIS — I130 Hypertensive heart and chronic kidney disease with heart failure and stage 1 through stage 4 chronic kidney disease, or unspecified chronic kidney disease: Secondary | ICD-10-CM | POA: Diagnosis not present

## 2023-08-20 DIAGNOSIS — I87313 Chronic venous hypertension (idiopathic) with ulcer of bilateral lower extremity: Secondary | ICD-10-CM | POA: Diagnosis not present

## 2023-08-20 DIAGNOSIS — D631 Anemia in chronic kidney disease: Secondary | ICD-10-CM | POA: Diagnosis not present

## 2023-08-20 DIAGNOSIS — E1122 Type 2 diabetes mellitus with diabetic chronic kidney disease: Secondary | ICD-10-CM | POA: Diagnosis not present

## 2023-08-20 DIAGNOSIS — Z7984 Long term (current) use of oral hypoglycemic drugs: Secondary | ICD-10-CM | POA: Diagnosis not present

## 2023-08-21 DIAGNOSIS — R2681 Unsteadiness on feet: Secondary | ICD-10-CM | POA: Diagnosis not present

## 2023-08-21 DIAGNOSIS — I5033 Acute on chronic diastolic (congestive) heart failure: Secondary | ICD-10-CM | POA: Diagnosis not present

## 2023-08-21 DIAGNOSIS — E039 Hypothyroidism, unspecified: Secondary | ICD-10-CM | POA: Diagnosis not present

## 2023-08-21 DIAGNOSIS — M6281 Muscle weakness (generalized): Secondary | ICD-10-CM | POA: Diagnosis not present

## 2023-08-21 DIAGNOSIS — J309 Allergic rhinitis, unspecified: Secondary | ICD-10-CM | POA: Diagnosis not present

## 2023-08-27 DIAGNOSIS — N1832 Chronic kidney disease, stage 3b: Secondary | ICD-10-CM | POA: Diagnosis not present

## 2023-08-27 DIAGNOSIS — I4819 Other persistent atrial fibrillation: Secondary | ICD-10-CM | POA: Diagnosis not present

## 2023-08-27 DIAGNOSIS — I5033 Acute on chronic diastolic (congestive) heart failure: Secondary | ICD-10-CM | POA: Diagnosis not present

## 2023-08-27 DIAGNOSIS — E1122 Type 2 diabetes mellitus with diabetic chronic kidney disease: Secondary | ICD-10-CM | POA: Diagnosis not present

## 2023-08-27 DIAGNOSIS — L97811 Non-pressure chronic ulcer of other part of right lower leg limited to breakdown of skin: Secondary | ICD-10-CM | POA: Diagnosis not present

## 2023-08-27 DIAGNOSIS — I130 Hypertensive heart and chronic kidney disease with heart failure and stage 1 through stage 4 chronic kidney disease, or unspecified chronic kidney disease: Secondary | ICD-10-CM | POA: Diagnosis not present

## 2023-08-27 DIAGNOSIS — Z7984 Long term (current) use of oral hypoglycemic drugs: Secondary | ICD-10-CM | POA: Diagnosis not present

## 2023-08-27 DIAGNOSIS — D631 Anemia in chronic kidney disease: Secondary | ICD-10-CM | POA: Diagnosis not present

## 2023-08-27 DIAGNOSIS — I87313 Chronic venous hypertension (idiopathic) with ulcer of bilateral lower extremity: Secondary | ICD-10-CM | POA: Diagnosis not present

## 2023-08-27 DIAGNOSIS — L97821 Non-pressure chronic ulcer of other part of left lower leg limited to breakdown of skin: Secondary | ICD-10-CM | POA: Diagnosis not present

## 2023-09-08 DIAGNOSIS — E1122 Type 2 diabetes mellitus with diabetic chronic kidney disease: Secondary | ICD-10-CM | POA: Diagnosis not present

## 2023-09-08 DIAGNOSIS — N1832 Chronic kidney disease, stage 3b: Secondary | ICD-10-CM | POA: Diagnosis not present

## 2023-09-08 DIAGNOSIS — L97811 Non-pressure chronic ulcer of other part of right lower leg limited to breakdown of skin: Secondary | ICD-10-CM | POA: Diagnosis not present

## 2023-09-08 DIAGNOSIS — D631 Anemia in chronic kidney disease: Secondary | ICD-10-CM | POA: Diagnosis not present

## 2023-09-08 DIAGNOSIS — I87313 Chronic venous hypertension (idiopathic) with ulcer of bilateral lower extremity: Secondary | ICD-10-CM | POA: Diagnosis not present

## 2023-09-08 DIAGNOSIS — I130 Hypertensive heart and chronic kidney disease with heart failure and stage 1 through stage 4 chronic kidney disease, or unspecified chronic kidney disease: Secondary | ICD-10-CM | POA: Diagnosis not present

## 2023-09-08 DIAGNOSIS — I5033 Acute on chronic diastolic (congestive) heart failure: Secondary | ICD-10-CM | POA: Diagnosis not present

## 2023-09-08 DIAGNOSIS — I4819 Other persistent atrial fibrillation: Secondary | ICD-10-CM | POA: Diagnosis not present

## 2023-09-08 DIAGNOSIS — L97821 Non-pressure chronic ulcer of other part of left lower leg limited to breakdown of skin: Secondary | ICD-10-CM | POA: Diagnosis not present

## 2023-09-08 DIAGNOSIS — Z7984 Long term (current) use of oral hypoglycemic drugs: Secondary | ICD-10-CM | POA: Diagnosis not present

## 2023-09-15 DIAGNOSIS — L97811 Non-pressure chronic ulcer of other part of right lower leg limited to breakdown of skin: Secondary | ICD-10-CM | POA: Diagnosis not present

## 2023-09-15 DIAGNOSIS — I87313 Chronic venous hypertension (idiopathic) with ulcer of bilateral lower extremity: Secondary | ICD-10-CM | POA: Diagnosis not present

## 2023-09-15 DIAGNOSIS — D631 Anemia in chronic kidney disease: Secondary | ICD-10-CM | POA: Diagnosis not present

## 2023-09-15 DIAGNOSIS — E1122 Type 2 diabetes mellitus with diabetic chronic kidney disease: Secondary | ICD-10-CM | POA: Diagnosis not present

## 2023-09-15 DIAGNOSIS — I130 Hypertensive heart and chronic kidney disease with heart failure and stage 1 through stage 4 chronic kidney disease, or unspecified chronic kidney disease: Secondary | ICD-10-CM | POA: Diagnosis not present

## 2023-09-15 DIAGNOSIS — I5033 Acute on chronic diastolic (congestive) heart failure: Secondary | ICD-10-CM | POA: Diagnosis not present

## 2023-09-15 DIAGNOSIS — Z7984 Long term (current) use of oral hypoglycemic drugs: Secondary | ICD-10-CM | POA: Diagnosis not present

## 2023-09-15 DIAGNOSIS — N1832 Chronic kidney disease, stage 3b: Secondary | ICD-10-CM | POA: Diagnosis not present

## 2023-09-15 DIAGNOSIS — I4819 Other persistent atrial fibrillation: Secondary | ICD-10-CM | POA: Diagnosis not present

## 2023-09-15 DIAGNOSIS — L97821 Non-pressure chronic ulcer of other part of left lower leg limited to breakdown of skin: Secondary | ICD-10-CM | POA: Diagnosis not present

## 2023-09-17 DIAGNOSIS — L97821 Non-pressure chronic ulcer of other part of left lower leg limited to breakdown of skin: Secondary | ICD-10-CM | POA: Diagnosis not present

## 2023-09-17 DIAGNOSIS — N1832 Chronic kidney disease, stage 3b: Secondary | ICD-10-CM | POA: Diagnosis not present

## 2023-09-17 DIAGNOSIS — L97811 Non-pressure chronic ulcer of other part of right lower leg limited to breakdown of skin: Secondary | ICD-10-CM | POA: Diagnosis not present

## 2023-09-17 DIAGNOSIS — I5033 Acute on chronic diastolic (congestive) heart failure: Secondary | ICD-10-CM | POA: Diagnosis not present

## 2023-09-17 DIAGNOSIS — Z7984 Long term (current) use of oral hypoglycemic drugs: Secondary | ICD-10-CM | POA: Diagnosis not present

## 2023-09-17 DIAGNOSIS — D631 Anemia in chronic kidney disease: Secondary | ICD-10-CM | POA: Diagnosis not present

## 2023-09-17 DIAGNOSIS — E1122 Type 2 diabetes mellitus with diabetic chronic kidney disease: Secondary | ICD-10-CM | POA: Diagnosis not present

## 2023-09-17 DIAGNOSIS — I4819 Other persistent atrial fibrillation: Secondary | ICD-10-CM | POA: Diagnosis not present

## 2023-09-17 DIAGNOSIS — I130 Hypertensive heart and chronic kidney disease with heart failure and stage 1 through stage 4 chronic kidney disease, or unspecified chronic kidney disease: Secondary | ICD-10-CM | POA: Diagnosis not present

## 2023-09-17 DIAGNOSIS — I87313 Chronic venous hypertension (idiopathic) with ulcer of bilateral lower extremity: Secondary | ICD-10-CM | POA: Diagnosis not present

## 2023-09-18 DIAGNOSIS — I5033 Acute on chronic diastolic (congestive) heart failure: Secondary | ICD-10-CM | POA: Diagnosis not present

## 2023-09-18 DIAGNOSIS — M6281 Muscle weakness (generalized): Secondary | ICD-10-CM | POA: Diagnosis not present

## 2023-09-18 DIAGNOSIS — J309 Allergic rhinitis, unspecified: Secondary | ICD-10-CM | POA: Diagnosis not present

## 2023-09-18 DIAGNOSIS — R2681 Unsteadiness on feet: Secondary | ICD-10-CM | POA: Diagnosis not present

## 2023-09-18 DIAGNOSIS — E039 Hypothyroidism, unspecified: Secondary | ICD-10-CM | POA: Diagnosis not present

## 2023-09-22 DIAGNOSIS — L97821 Non-pressure chronic ulcer of other part of left lower leg limited to breakdown of skin: Secondary | ICD-10-CM | POA: Diagnosis not present

## 2023-09-22 DIAGNOSIS — E1122 Type 2 diabetes mellitus with diabetic chronic kidney disease: Secondary | ICD-10-CM | POA: Diagnosis not present

## 2023-09-22 DIAGNOSIS — D631 Anemia in chronic kidney disease: Secondary | ICD-10-CM | POA: Diagnosis not present

## 2023-09-22 DIAGNOSIS — Z7984 Long term (current) use of oral hypoglycemic drugs: Secondary | ICD-10-CM | POA: Diagnosis not present

## 2023-09-22 DIAGNOSIS — I5033 Acute on chronic diastolic (congestive) heart failure: Secondary | ICD-10-CM | POA: Diagnosis not present

## 2023-09-22 DIAGNOSIS — N1832 Chronic kidney disease, stage 3b: Secondary | ICD-10-CM | POA: Diagnosis not present

## 2023-09-22 DIAGNOSIS — I87313 Chronic venous hypertension (idiopathic) with ulcer of bilateral lower extremity: Secondary | ICD-10-CM | POA: Diagnosis not present

## 2023-09-22 DIAGNOSIS — L97811 Non-pressure chronic ulcer of other part of right lower leg limited to breakdown of skin: Secondary | ICD-10-CM | POA: Diagnosis not present

## 2023-09-22 DIAGNOSIS — I4819 Other persistent atrial fibrillation: Secondary | ICD-10-CM | POA: Diagnosis not present

## 2023-09-22 DIAGNOSIS — I130 Hypertensive heart and chronic kidney disease with heart failure and stage 1 through stage 4 chronic kidney disease, or unspecified chronic kidney disease: Secondary | ICD-10-CM | POA: Diagnosis not present

## 2023-09-23 DIAGNOSIS — I87313 Chronic venous hypertension (idiopathic) with ulcer of bilateral lower extremity: Secondary | ICD-10-CM | POA: Diagnosis not present

## 2023-09-23 DIAGNOSIS — L97821 Non-pressure chronic ulcer of other part of left lower leg limited to breakdown of skin: Secondary | ICD-10-CM | POA: Diagnosis not present

## 2023-09-23 DIAGNOSIS — N1832 Chronic kidney disease, stage 3b: Secondary | ICD-10-CM | POA: Diagnosis not present

## 2023-09-23 DIAGNOSIS — Z7984 Long term (current) use of oral hypoglycemic drugs: Secondary | ICD-10-CM | POA: Diagnosis not present

## 2023-09-23 DIAGNOSIS — E1122 Type 2 diabetes mellitus with diabetic chronic kidney disease: Secondary | ICD-10-CM | POA: Diagnosis not present

## 2023-09-23 DIAGNOSIS — D631 Anemia in chronic kidney disease: Secondary | ICD-10-CM | POA: Diagnosis not present

## 2023-09-23 DIAGNOSIS — I4819 Other persistent atrial fibrillation: Secondary | ICD-10-CM | POA: Diagnosis not present

## 2023-09-23 DIAGNOSIS — I5033 Acute on chronic diastolic (congestive) heart failure: Secondary | ICD-10-CM | POA: Diagnosis not present

## 2023-09-23 DIAGNOSIS — L97811 Non-pressure chronic ulcer of other part of right lower leg limited to breakdown of skin: Secondary | ICD-10-CM | POA: Diagnosis not present

## 2023-09-23 DIAGNOSIS — I130 Hypertensive heart and chronic kidney disease with heart failure and stage 1 through stage 4 chronic kidney disease, or unspecified chronic kidney disease: Secondary | ICD-10-CM | POA: Diagnosis not present

## 2023-09-24 DIAGNOSIS — N1832 Chronic kidney disease, stage 3b: Secondary | ICD-10-CM | POA: Diagnosis not present

## 2023-09-24 DIAGNOSIS — L97821 Non-pressure chronic ulcer of other part of left lower leg limited to breakdown of skin: Secondary | ICD-10-CM | POA: Diagnosis not present

## 2023-09-24 DIAGNOSIS — I4819 Other persistent atrial fibrillation: Secondary | ICD-10-CM | POA: Diagnosis not present

## 2023-09-24 DIAGNOSIS — I87313 Chronic venous hypertension (idiopathic) with ulcer of bilateral lower extremity: Secondary | ICD-10-CM | POA: Diagnosis not present

## 2023-09-24 DIAGNOSIS — I5033 Acute on chronic diastolic (congestive) heart failure: Secondary | ICD-10-CM | POA: Diagnosis not present

## 2023-09-24 DIAGNOSIS — Z7984 Long term (current) use of oral hypoglycemic drugs: Secondary | ICD-10-CM | POA: Diagnosis not present

## 2023-09-24 DIAGNOSIS — D631 Anemia in chronic kidney disease: Secondary | ICD-10-CM | POA: Diagnosis not present

## 2023-09-24 DIAGNOSIS — L97811 Non-pressure chronic ulcer of other part of right lower leg limited to breakdown of skin: Secondary | ICD-10-CM | POA: Diagnosis not present

## 2023-09-24 DIAGNOSIS — I130 Hypertensive heart and chronic kidney disease with heart failure and stage 1 through stage 4 chronic kidney disease, or unspecified chronic kidney disease: Secondary | ICD-10-CM | POA: Diagnosis not present

## 2023-09-24 DIAGNOSIS — E1122 Type 2 diabetes mellitus with diabetic chronic kidney disease: Secondary | ICD-10-CM | POA: Diagnosis not present

## 2023-09-25 DIAGNOSIS — E1122 Type 2 diabetes mellitus with diabetic chronic kidney disease: Secondary | ICD-10-CM | POA: Diagnosis not present

## 2023-09-25 DIAGNOSIS — I4819 Other persistent atrial fibrillation: Secondary | ICD-10-CM | POA: Diagnosis not present

## 2023-09-25 DIAGNOSIS — Z7984 Long term (current) use of oral hypoglycemic drugs: Secondary | ICD-10-CM | POA: Diagnosis not present

## 2023-09-25 DIAGNOSIS — I5033 Acute on chronic diastolic (congestive) heart failure: Secondary | ICD-10-CM | POA: Diagnosis not present

## 2023-09-25 DIAGNOSIS — N1832 Chronic kidney disease, stage 3b: Secondary | ICD-10-CM | POA: Diagnosis not present

## 2023-09-25 DIAGNOSIS — L97811 Non-pressure chronic ulcer of other part of right lower leg limited to breakdown of skin: Secondary | ICD-10-CM | POA: Diagnosis not present

## 2023-09-25 DIAGNOSIS — I87313 Chronic venous hypertension (idiopathic) with ulcer of bilateral lower extremity: Secondary | ICD-10-CM | POA: Diagnosis not present

## 2023-09-25 DIAGNOSIS — L97821 Non-pressure chronic ulcer of other part of left lower leg limited to breakdown of skin: Secondary | ICD-10-CM | POA: Diagnosis not present

## 2023-09-25 DIAGNOSIS — D631 Anemia in chronic kidney disease: Secondary | ICD-10-CM | POA: Diagnosis not present

## 2023-09-25 DIAGNOSIS — I130 Hypertensive heart and chronic kidney disease with heart failure and stage 1 through stage 4 chronic kidney disease, or unspecified chronic kidney disease: Secondary | ICD-10-CM | POA: Diagnosis not present

## 2023-09-30 DIAGNOSIS — D631 Anemia in chronic kidney disease: Secondary | ICD-10-CM | POA: Diagnosis not present

## 2023-09-30 DIAGNOSIS — L97811 Non-pressure chronic ulcer of other part of right lower leg limited to breakdown of skin: Secondary | ICD-10-CM | POA: Diagnosis not present

## 2023-09-30 DIAGNOSIS — I5033 Acute on chronic diastolic (congestive) heart failure: Secondary | ICD-10-CM | POA: Diagnosis not present

## 2023-09-30 DIAGNOSIS — I130 Hypertensive heart and chronic kidney disease with heart failure and stage 1 through stage 4 chronic kidney disease, or unspecified chronic kidney disease: Secondary | ICD-10-CM | POA: Diagnosis not present

## 2023-09-30 DIAGNOSIS — Z7984 Long term (current) use of oral hypoglycemic drugs: Secondary | ICD-10-CM | POA: Diagnosis not present

## 2023-09-30 DIAGNOSIS — I4819 Other persistent atrial fibrillation: Secondary | ICD-10-CM | POA: Diagnosis not present

## 2023-09-30 DIAGNOSIS — L97821 Non-pressure chronic ulcer of other part of left lower leg limited to breakdown of skin: Secondary | ICD-10-CM | POA: Diagnosis not present

## 2023-09-30 DIAGNOSIS — I87313 Chronic venous hypertension (idiopathic) with ulcer of bilateral lower extremity: Secondary | ICD-10-CM | POA: Diagnosis not present

## 2023-09-30 DIAGNOSIS — N1832 Chronic kidney disease, stage 3b: Secondary | ICD-10-CM | POA: Diagnosis not present

## 2023-09-30 DIAGNOSIS — E1122 Type 2 diabetes mellitus with diabetic chronic kidney disease: Secondary | ICD-10-CM | POA: Diagnosis not present

## 2023-10-01 DIAGNOSIS — L97821 Non-pressure chronic ulcer of other part of left lower leg limited to breakdown of skin: Secondary | ICD-10-CM | POA: Diagnosis not present

## 2023-10-01 DIAGNOSIS — Z7984 Long term (current) use of oral hypoglycemic drugs: Secondary | ICD-10-CM | POA: Diagnosis not present

## 2023-10-01 DIAGNOSIS — I87313 Chronic venous hypertension (idiopathic) with ulcer of bilateral lower extremity: Secondary | ICD-10-CM | POA: Diagnosis not present

## 2023-10-01 DIAGNOSIS — L97811 Non-pressure chronic ulcer of other part of right lower leg limited to breakdown of skin: Secondary | ICD-10-CM | POA: Diagnosis not present

## 2023-10-01 DIAGNOSIS — I130 Hypertensive heart and chronic kidney disease with heart failure and stage 1 through stage 4 chronic kidney disease, or unspecified chronic kidney disease: Secondary | ICD-10-CM | POA: Diagnosis not present

## 2023-10-01 DIAGNOSIS — N1832 Chronic kidney disease, stage 3b: Secondary | ICD-10-CM | POA: Diagnosis not present

## 2023-10-01 DIAGNOSIS — I5033 Acute on chronic diastolic (congestive) heart failure: Secondary | ICD-10-CM | POA: Diagnosis not present

## 2023-10-01 DIAGNOSIS — D631 Anemia in chronic kidney disease: Secondary | ICD-10-CM | POA: Diagnosis not present

## 2023-10-01 DIAGNOSIS — I4819 Other persistent atrial fibrillation: Secondary | ICD-10-CM | POA: Diagnosis not present

## 2023-10-01 DIAGNOSIS — E1122 Type 2 diabetes mellitus with diabetic chronic kidney disease: Secondary | ICD-10-CM | POA: Diagnosis not present

## 2023-10-02 DIAGNOSIS — I130 Hypertensive heart and chronic kidney disease with heart failure and stage 1 through stage 4 chronic kidney disease, or unspecified chronic kidney disease: Secondary | ICD-10-CM | POA: Diagnosis not present

## 2023-10-02 DIAGNOSIS — D631 Anemia in chronic kidney disease: Secondary | ICD-10-CM | POA: Diagnosis not present

## 2023-10-02 DIAGNOSIS — I87313 Chronic venous hypertension (idiopathic) with ulcer of bilateral lower extremity: Secondary | ICD-10-CM | POA: Diagnosis not present

## 2023-10-02 DIAGNOSIS — I4819 Other persistent atrial fibrillation: Secondary | ICD-10-CM | POA: Diagnosis not present

## 2023-10-02 DIAGNOSIS — L97821 Non-pressure chronic ulcer of other part of left lower leg limited to breakdown of skin: Secondary | ICD-10-CM | POA: Diagnosis not present

## 2023-10-02 DIAGNOSIS — N1832 Chronic kidney disease, stage 3b: Secondary | ICD-10-CM | POA: Diagnosis not present

## 2023-10-02 DIAGNOSIS — I5033 Acute on chronic diastolic (congestive) heart failure: Secondary | ICD-10-CM | POA: Diagnosis not present

## 2023-10-02 DIAGNOSIS — Z7984 Long term (current) use of oral hypoglycemic drugs: Secondary | ICD-10-CM | POA: Diagnosis not present

## 2023-10-02 DIAGNOSIS — L97811 Non-pressure chronic ulcer of other part of right lower leg limited to breakdown of skin: Secondary | ICD-10-CM | POA: Diagnosis not present

## 2023-10-02 DIAGNOSIS — E1122 Type 2 diabetes mellitus with diabetic chronic kidney disease: Secondary | ICD-10-CM | POA: Diagnosis not present

## 2023-10-12 DIAGNOSIS — I87313 Chronic venous hypertension (idiopathic) with ulcer of bilateral lower extremity: Secondary | ICD-10-CM | POA: Diagnosis not present

## 2023-10-12 DIAGNOSIS — I5033 Acute on chronic diastolic (congestive) heart failure: Secondary | ICD-10-CM | POA: Diagnosis not present

## 2023-10-12 DIAGNOSIS — I130 Hypertensive heart and chronic kidney disease with heart failure and stage 1 through stage 4 chronic kidney disease, or unspecified chronic kidney disease: Secondary | ICD-10-CM | POA: Diagnosis not present

## 2023-10-12 DIAGNOSIS — E1122 Type 2 diabetes mellitus with diabetic chronic kidney disease: Secondary | ICD-10-CM | POA: Diagnosis not present

## 2023-10-12 DIAGNOSIS — I4819 Other persistent atrial fibrillation: Secondary | ICD-10-CM | POA: Diagnosis not present

## 2023-10-12 DIAGNOSIS — L97811 Non-pressure chronic ulcer of other part of right lower leg limited to breakdown of skin: Secondary | ICD-10-CM | POA: Diagnosis not present

## 2023-10-12 DIAGNOSIS — D631 Anemia in chronic kidney disease: Secondary | ICD-10-CM | POA: Diagnosis not present

## 2023-10-12 DIAGNOSIS — Z7984 Long term (current) use of oral hypoglycemic drugs: Secondary | ICD-10-CM | POA: Diagnosis not present

## 2023-10-12 DIAGNOSIS — N1832 Chronic kidney disease, stage 3b: Secondary | ICD-10-CM | POA: Diagnosis not present

## 2023-10-12 DIAGNOSIS — L97821 Non-pressure chronic ulcer of other part of left lower leg limited to breakdown of skin: Secondary | ICD-10-CM | POA: Diagnosis not present

## 2023-10-14 DIAGNOSIS — E039 Hypothyroidism, unspecified: Secondary | ICD-10-CM | POA: Diagnosis not present

## 2023-10-14 DIAGNOSIS — I5032 Chronic diastolic (congestive) heart failure: Secondary | ICD-10-CM | POA: Diagnosis not present

## 2023-10-14 DIAGNOSIS — I4891 Unspecified atrial fibrillation: Secondary | ICD-10-CM | POA: Diagnosis not present

## 2023-10-14 DIAGNOSIS — E261 Secondary hyperaldosteronism: Secondary | ICD-10-CM | POA: Diagnosis not present

## 2023-10-14 DIAGNOSIS — E11621 Type 2 diabetes mellitus with foot ulcer: Secondary | ICD-10-CM | POA: Diagnosis not present

## 2023-10-17 DIAGNOSIS — L97811 Non-pressure chronic ulcer of other part of right lower leg limited to breakdown of skin: Secondary | ICD-10-CM | POA: Diagnosis not present

## 2023-10-17 DIAGNOSIS — L89223 Pressure ulcer of left hip, stage 3: Secondary | ICD-10-CM | POA: Diagnosis not present

## 2023-10-19 DIAGNOSIS — J309 Allergic rhinitis, unspecified: Secondary | ICD-10-CM | POA: Diagnosis not present

## 2023-10-19 DIAGNOSIS — R2681 Unsteadiness on feet: Secondary | ICD-10-CM | POA: Diagnosis not present

## 2023-10-19 DIAGNOSIS — I5033 Acute on chronic diastolic (congestive) heart failure: Secondary | ICD-10-CM | POA: Diagnosis not present

## 2023-10-19 DIAGNOSIS — M6281 Muscle weakness (generalized): Secondary | ICD-10-CM | POA: Diagnosis not present

## 2023-10-19 DIAGNOSIS — E039 Hypothyroidism, unspecified: Secondary | ICD-10-CM | POA: Diagnosis not present

## 2023-11-17 DIAGNOSIS — M1710 Unilateral primary osteoarthritis, unspecified knee: Secondary | ICD-10-CM | POA: Diagnosis not present

## 2023-11-17 DIAGNOSIS — M79672 Pain in left foot: Secondary | ICD-10-CM | POA: Diagnosis not present

## 2023-11-17 DIAGNOSIS — S93402A Sprain of unspecified ligament of left ankle, initial encounter: Secondary | ICD-10-CM | POA: Diagnosis not present

## 2023-11-17 DIAGNOSIS — M1711 Unilateral primary osteoarthritis, right knee: Secondary | ICD-10-CM | POA: Diagnosis not present

## 2023-11-17 DIAGNOSIS — M25572 Pain in left ankle and joints of left foot: Secondary | ICD-10-CM | POA: Diagnosis not present

## 2023-11-17 DIAGNOSIS — M1712 Unilateral primary osteoarthritis, left knee: Secondary | ICD-10-CM | POA: Diagnosis not present

## 2023-11-18 DIAGNOSIS — E039 Hypothyroidism, unspecified: Secondary | ICD-10-CM | POA: Diagnosis not present

## 2023-11-18 DIAGNOSIS — M6281 Muscle weakness (generalized): Secondary | ICD-10-CM | POA: Diagnosis not present

## 2023-11-18 DIAGNOSIS — J309 Allergic rhinitis, unspecified: Secondary | ICD-10-CM | POA: Diagnosis not present

## 2023-11-18 DIAGNOSIS — R2681 Unsteadiness on feet: Secondary | ICD-10-CM | POA: Diagnosis not present

## 2023-11-18 DIAGNOSIS — I5033 Acute on chronic diastolic (congestive) heart failure: Secondary | ICD-10-CM | POA: Diagnosis not present

## 2023-12-19 DIAGNOSIS — M6281 Muscle weakness (generalized): Secondary | ICD-10-CM | POA: Diagnosis not present

## 2023-12-19 DIAGNOSIS — I5033 Acute on chronic diastolic (congestive) heart failure: Secondary | ICD-10-CM | POA: Diagnosis not present

## 2023-12-19 DIAGNOSIS — E039 Hypothyroidism, unspecified: Secondary | ICD-10-CM | POA: Diagnosis not present

## 2023-12-19 DIAGNOSIS — J309 Allergic rhinitis, unspecified: Secondary | ICD-10-CM | POA: Diagnosis not present

## 2023-12-19 DIAGNOSIS — R2681 Unsteadiness on feet: Secondary | ICD-10-CM | POA: Diagnosis not present

## 2024-01-18 DIAGNOSIS — M6281 Muscle weakness (generalized): Secondary | ICD-10-CM | POA: Diagnosis not present

## 2024-01-18 DIAGNOSIS — I5033 Acute on chronic diastolic (congestive) heart failure: Secondary | ICD-10-CM | POA: Diagnosis not present

## 2024-01-18 DIAGNOSIS — R2681 Unsteadiness on feet: Secondary | ICD-10-CM | POA: Diagnosis not present

## 2024-01-18 DIAGNOSIS — J309 Allergic rhinitis, unspecified: Secondary | ICD-10-CM | POA: Diagnosis not present

## 2024-01-18 DIAGNOSIS — E039 Hypothyroidism, unspecified: Secondary | ICD-10-CM | POA: Diagnosis not present

## 2024-02-18 DIAGNOSIS — E039 Hypothyroidism, unspecified: Secondary | ICD-10-CM | POA: Diagnosis not present

## 2024-02-18 DIAGNOSIS — I5033 Acute on chronic diastolic (congestive) heart failure: Secondary | ICD-10-CM | POA: Diagnosis not present

## 2024-02-18 DIAGNOSIS — J309 Allergic rhinitis, unspecified: Secondary | ICD-10-CM | POA: Diagnosis not present

## 2024-02-18 DIAGNOSIS — R2681 Unsteadiness on feet: Secondary | ICD-10-CM | POA: Diagnosis not present

## 2024-02-18 DIAGNOSIS — M6281 Muscle weakness (generalized): Secondary | ICD-10-CM | POA: Diagnosis not present
